# Patient Record
Sex: Female | Born: 1950 | Race: White | Hispanic: No | Marital: Married | Smoking: Former smoker
Health system: Southern US, Community
[De-identification: ages and names within clinical notes are randomized; demographics above are authoritative.]

## PROBLEM LIST (undated history)

## (undated) DIAGNOSIS — E785 Hyperlipidemia, unspecified: Secondary | ICD-10-CM

## (undated) DIAGNOSIS — I10 Essential (primary) hypertension: Secondary | ICD-10-CM

## (undated) DIAGNOSIS — F419 Anxiety disorder, unspecified: Secondary | ICD-10-CM

## (undated) DIAGNOSIS — C50511 Malignant neoplasm of lower-outer quadrant of right female breast: Principal | ICD-10-CM

## (undated) DIAGNOSIS — T7840XA Allergy, unspecified, initial encounter: Secondary | ICD-10-CM

## (undated) DIAGNOSIS — Z841 Family history of disorders of kidney and ureter: Secondary | ICD-10-CM

## (undated) DIAGNOSIS — Z803 Family history of malignant neoplasm of breast: Secondary | ICD-10-CM

## (undated) DIAGNOSIS — H269 Unspecified cataract: Secondary | ICD-10-CM

## (undated) DIAGNOSIS — Z720 Tobacco use: Secondary | ICD-10-CM

## (undated) DIAGNOSIS — M1990 Unspecified osteoarthritis, unspecified site: Secondary | ICD-10-CM

## (undated) DIAGNOSIS — Z9889 Other specified postprocedural states: Secondary | ICD-10-CM

## (undated) DIAGNOSIS — K219 Gastro-esophageal reflux disease without esophagitis: Secondary | ICD-10-CM

## (undated) DIAGNOSIS — C50919 Malignant neoplasm of unspecified site of unspecified female breast: Secondary | ICD-10-CM

## (undated) DIAGNOSIS — R112 Nausea with vomiting, unspecified: Secondary | ICD-10-CM

## (undated) DIAGNOSIS — Z8419 Family history of other disorders of kidney and ureter: Secondary | ICD-10-CM

## (undated) DIAGNOSIS — M899 Disorder of bone, unspecified: Secondary | ICD-10-CM

## (undated) DIAGNOSIS — M949 Disorder of cartilage, unspecified: Secondary | ICD-10-CM

## (undated) HISTORY — PX: DILATION AND CURETTAGE OF UTERUS: SHX78

## (undated) HISTORY — PX: ABDOMINAL HYSTERECTOMY: SHX81

## (undated) HISTORY — PX: COLONOSCOPY: SHX174

## (undated) HISTORY — DX: Tobacco use: Z72.0

## (undated) HISTORY — DX: Hyperlipidemia, unspecified: E78.5

## (undated) HISTORY — DX: Disorder of cartilage, unspecified: M94.9

## (undated) HISTORY — DX: Family history of disorders of kidney and ureter: Z84.1

## (undated) HISTORY — PX: APPENDECTOMY: SHX54

## (undated) HISTORY — DX: Family history of malignant neoplasm of breast: Z80.3

## (undated) HISTORY — DX: Malignant neoplasm of unspecified site of unspecified female breast: C50.919

## (undated) HISTORY — DX: Essential (primary) hypertension: I10

## (undated) HISTORY — DX: Disorder of bone, unspecified: M89.9

## (undated) HISTORY — DX: Gastro-esophageal reflux disease without esophagitis: K21.9

## (undated) HISTORY — DX: Anxiety disorder, unspecified: F41.9

## (undated) HISTORY — DX: Allergy, unspecified, initial encounter: T78.40XA

## (undated) HISTORY — DX: Unspecified osteoarthritis, unspecified site: M19.90

## (undated) HISTORY — DX: Family history of other disorders of kidney and ureter: Z84.19

## (undated) HISTORY — DX: Malignant neoplasm of lower-outer quadrant of right female breast: C50.511

## (undated) HISTORY — DX: Unspecified cataract: H26.9

---

## 1994-05-16 HISTORY — PX: TOTAL VAGINAL HYSTERECTOMY: SHX2548

## 1999-07-07 ENCOUNTER — Other Ambulatory Visit: Admission: RE | Admit: 1999-07-07 | Discharge: 1999-07-07 | Payer: Self-pay | Admitting: *Deleted

## 2000-08-10 ENCOUNTER — Other Ambulatory Visit: Admission: RE | Admit: 2000-08-10 | Discharge: 2000-08-10 | Payer: Self-pay | Admitting: *Deleted

## 2001-08-07 ENCOUNTER — Other Ambulatory Visit: Admission: RE | Admit: 2001-08-07 | Discharge: 2001-08-07 | Payer: Self-pay | Admitting: *Deleted

## 2002-08-26 ENCOUNTER — Other Ambulatory Visit: Admission: RE | Admit: 2002-08-26 | Discharge: 2002-08-26 | Payer: Self-pay | Admitting: *Deleted

## 2003-09-23 ENCOUNTER — Other Ambulatory Visit: Admission: RE | Admit: 2003-09-23 | Discharge: 2003-09-23 | Payer: Self-pay | Admitting: *Deleted

## 2004-03-31 ENCOUNTER — Ambulatory Visit: Payer: Self-pay | Admitting: Family Medicine

## 2004-04-21 ENCOUNTER — Ambulatory Visit: Payer: Self-pay | Admitting: Family Medicine

## 2004-05-05 ENCOUNTER — Ambulatory Visit: Payer: Self-pay | Admitting: Family Medicine

## 2004-05-26 ENCOUNTER — Ambulatory Visit: Payer: Self-pay | Admitting: Family Medicine

## 2004-06-15 ENCOUNTER — Ambulatory Visit: Payer: Self-pay | Admitting: Family Medicine

## 2004-07-07 ENCOUNTER — Ambulatory Visit: Payer: Self-pay | Admitting: Family Medicine

## 2004-08-03 ENCOUNTER — Ambulatory Visit: Payer: Self-pay | Admitting: Family Medicine

## 2004-08-17 ENCOUNTER — Ambulatory Visit: Payer: Self-pay | Admitting: Family Medicine

## 2004-09-09 ENCOUNTER — Ambulatory Visit: Payer: Self-pay | Admitting: Family Medicine

## 2004-09-23 ENCOUNTER — Other Ambulatory Visit: Admission: RE | Admit: 2004-09-23 | Discharge: 2004-09-23 | Payer: Self-pay | Admitting: *Deleted

## 2004-09-29 ENCOUNTER — Ambulatory Visit: Payer: Self-pay | Admitting: Family Medicine

## 2004-10-20 ENCOUNTER — Ambulatory Visit: Payer: Self-pay | Admitting: Family Medicine

## 2004-11-04 ENCOUNTER — Ambulatory Visit: Payer: Self-pay | Admitting: Family Medicine

## 2004-11-25 ENCOUNTER — Ambulatory Visit: Payer: Self-pay | Admitting: Family Medicine

## 2004-12-15 ENCOUNTER — Ambulatory Visit: Payer: Self-pay | Admitting: Family Medicine

## 2005-01-05 ENCOUNTER — Ambulatory Visit: Payer: Self-pay | Admitting: Family Medicine

## 2005-01-10 ENCOUNTER — Ambulatory Visit: Payer: Self-pay | Admitting: Family Medicine

## 2005-01-13 ENCOUNTER — Ambulatory Visit: Payer: Self-pay | Admitting: Family Medicine

## 2005-01-26 ENCOUNTER — Ambulatory Visit: Payer: Self-pay | Admitting: Family Medicine

## 2005-02-23 ENCOUNTER — Ambulatory Visit: Payer: Self-pay | Admitting: Family Medicine

## 2005-03-17 ENCOUNTER — Ambulatory Visit: Payer: Self-pay | Admitting: Family Medicine

## 2005-04-06 ENCOUNTER — Ambulatory Visit: Payer: Self-pay | Admitting: Family Medicine

## 2005-04-27 ENCOUNTER — Ambulatory Visit: Payer: Self-pay | Admitting: Family Medicine

## 2005-05-19 ENCOUNTER — Ambulatory Visit: Payer: Self-pay | Admitting: Family Medicine

## 2005-06-14 ENCOUNTER — Ambulatory Visit: Payer: Self-pay | Admitting: Family Medicine

## 2005-07-12 ENCOUNTER — Ambulatory Visit: Payer: Self-pay | Admitting: Family Medicine

## 2005-08-11 ENCOUNTER — Ambulatory Visit: Payer: Self-pay | Admitting: Family Medicine

## 2005-09-07 ENCOUNTER — Ambulatory Visit: Payer: Self-pay | Admitting: Family Medicine

## 2005-09-13 ENCOUNTER — Encounter: Payer: Self-pay | Admitting: Family Medicine

## 2005-09-13 LAB — CONVERTED CEMR LAB: Pap Smear: NORMAL

## 2005-09-21 ENCOUNTER — Ambulatory Visit: Payer: Self-pay | Admitting: Family Medicine

## 2005-10-04 ENCOUNTER — Ambulatory Visit: Payer: Self-pay | Admitting: Family Medicine

## 2005-10-19 ENCOUNTER — Other Ambulatory Visit: Admission: RE | Admit: 2005-10-19 | Discharge: 2005-10-19 | Payer: Self-pay | Admitting: *Deleted

## 2005-10-20 ENCOUNTER — Ambulatory Visit: Payer: Self-pay | Admitting: Family Medicine

## 2005-11-09 ENCOUNTER — Ambulatory Visit: Payer: Self-pay | Admitting: Family Medicine

## 2005-11-29 ENCOUNTER — Ambulatory Visit: Payer: Self-pay | Admitting: Family Medicine

## 2005-12-20 ENCOUNTER — Ambulatory Visit: Payer: Self-pay | Admitting: Family Medicine

## 2006-01-12 ENCOUNTER — Ambulatory Visit: Payer: Self-pay | Admitting: Family Medicine

## 2006-01-23 ENCOUNTER — Ambulatory Visit: Payer: Self-pay | Admitting: Family Medicine

## 2006-02-14 ENCOUNTER — Ambulatory Visit: Payer: Self-pay | Admitting: Internal Medicine

## 2006-03-09 ENCOUNTER — Ambulatory Visit: Payer: Self-pay | Admitting: Family Medicine

## 2006-03-16 ENCOUNTER — Ambulatory Visit: Payer: Self-pay | Admitting: Family Medicine

## 2006-03-30 ENCOUNTER — Ambulatory Visit: Payer: Self-pay | Admitting: Family Medicine

## 2006-04-20 ENCOUNTER — Ambulatory Visit: Payer: Self-pay | Admitting: Family Medicine

## 2006-05-18 ENCOUNTER — Ambulatory Visit: Payer: Self-pay | Admitting: Family Medicine

## 2006-06-08 ENCOUNTER — Ambulatory Visit: Payer: Self-pay | Admitting: Family Medicine

## 2006-06-29 ENCOUNTER — Ambulatory Visit: Payer: Self-pay | Admitting: Family Medicine

## 2006-07-20 ENCOUNTER — Ambulatory Visit: Payer: Self-pay | Admitting: Family Medicine

## 2006-08-17 ENCOUNTER — Ambulatory Visit: Payer: Self-pay | Admitting: Family Medicine

## 2006-09-07 ENCOUNTER — Ambulatory Visit: Payer: Self-pay | Admitting: Family Medicine

## 2006-09-08 ENCOUNTER — Encounter: Payer: Self-pay | Admitting: Family Medicine

## 2006-09-08 DIAGNOSIS — J309 Allergic rhinitis, unspecified: Secondary | ICD-10-CM | POA: Insufficient documentation

## 2006-09-08 DIAGNOSIS — Z87891 Personal history of nicotine dependence: Secondary | ICD-10-CM

## 2006-09-08 DIAGNOSIS — I10 Essential (primary) hypertension: Secondary | ICD-10-CM | POA: Insufficient documentation

## 2006-09-28 ENCOUNTER — Ambulatory Visit: Payer: Self-pay | Admitting: Family Medicine

## 2006-10-24 ENCOUNTER — Ambulatory Visit: Payer: Self-pay | Admitting: Family Medicine

## 2006-11-14 ENCOUNTER — Ambulatory Visit: Payer: Self-pay | Admitting: Family Medicine

## 2006-11-30 ENCOUNTER — Ambulatory Visit: Payer: Self-pay | Admitting: Family Medicine

## 2006-12-12 ENCOUNTER — Other Ambulatory Visit: Admission: RE | Admit: 2006-12-12 | Discharge: 2006-12-12 | Payer: Self-pay | Admitting: *Deleted

## 2006-12-21 ENCOUNTER — Ambulatory Visit: Payer: Self-pay | Admitting: Family Medicine

## 2007-01-17 ENCOUNTER — Ambulatory Visit: Payer: Self-pay | Admitting: Family Medicine

## 2007-01-18 ENCOUNTER — Ambulatory Visit: Payer: Self-pay | Admitting: Internal Medicine

## 2007-02-01 ENCOUNTER — Ambulatory Visit: Payer: Self-pay | Admitting: Family Medicine

## 2007-02-01 DIAGNOSIS — E7800 Pure hypercholesterolemia, unspecified: Secondary | ICD-10-CM

## 2007-02-05 LAB — CONVERTED CEMR LAB
Basophils Relative: 0.5 % (ref 0.0–1.0)
CO2: 28 meq/L (ref 19–32)
Cholesterol: 170 mg/dL (ref 0–200)
Creatinine, Ser: 0.8 mg/dL (ref 0.4–1.2)
Glucose, Bld: 80 mg/dL (ref 70–99)
HCT: 37.5 % (ref 36.0–46.0)
Hemoglobin: 13.2 g/dL (ref 12.0–15.0)
LDL Cholesterol: 93 mg/dL (ref 0–99)
Monocytes Absolute: 0.5 10*3/uL (ref 0.2–0.7)
Neutrophils Relative %: 60.6 % (ref 43.0–77.0)
Potassium: 4.1 meq/L (ref 3.5–5.1)
RDW: 11.7 % (ref 11.5–14.6)
Sodium: 140 meq/L (ref 135–145)
TSH: 1.17 microintl units/mL (ref 0.35–5.50)
Total Bilirubin: 0.7 mg/dL (ref 0.3–1.2)
Total Protein: 7 g/dL (ref 6.0–8.3)
VLDL: 34 mg/dL (ref 0–40)

## 2007-02-21 ENCOUNTER — Ambulatory Visit: Payer: Self-pay | Admitting: Family Medicine

## 2007-03-14 ENCOUNTER — Ambulatory Visit: Payer: Self-pay | Admitting: Family Medicine

## 2007-03-15 ENCOUNTER — Ambulatory Visit: Payer: Self-pay | Admitting: Family Medicine

## 2007-04-04 ENCOUNTER — Ambulatory Visit: Payer: Self-pay | Admitting: Family Medicine

## 2007-04-25 ENCOUNTER — Ambulatory Visit: Payer: Self-pay | Admitting: Family Medicine

## 2007-04-27 ENCOUNTER — Ambulatory Visit: Payer: Self-pay | Admitting: Family Medicine

## 2007-05-22 ENCOUNTER — Ambulatory Visit: Payer: Self-pay | Admitting: Family Medicine

## 2007-06-13 ENCOUNTER — Ambulatory Visit: Payer: Self-pay | Admitting: Family Medicine

## 2007-07-05 ENCOUNTER — Ambulatory Visit: Payer: Self-pay | Admitting: Family Medicine

## 2007-07-24 ENCOUNTER — Ambulatory Visit: Payer: Self-pay | Admitting: Family Medicine

## 2007-08-09 ENCOUNTER — Ambulatory Visit: Payer: Self-pay | Admitting: Family Medicine

## 2007-09-05 ENCOUNTER — Ambulatory Visit: Payer: Self-pay | Admitting: Family Medicine

## 2007-09-27 ENCOUNTER — Ambulatory Visit: Payer: Self-pay | Admitting: Family Medicine

## 2007-10-18 ENCOUNTER — Ambulatory Visit: Payer: Self-pay | Admitting: Family Medicine

## 2007-11-01 ENCOUNTER — Ambulatory Visit: Payer: Self-pay | Admitting: Family Medicine

## 2007-11-21 ENCOUNTER — Ambulatory Visit: Payer: Self-pay | Admitting: Family Medicine

## 2007-12-06 ENCOUNTER — Ambulatory Visit: Payer: Self-pay | Admitting: Family Medicine

## 2007-12-12 ENCOUNTER — Other Ambulatory Visit: Admission: RE | Admit: 2007-12-12 | Discharge: 2007-12-12 | Payer: Self-pay | Admitting: Gynecology

## 2007-12-17 ENCOUNTER — Telehealth (INDEPENDENT_AMBULATORY_CARE_PROVIDER_SITE_OTHER): Payer: Self-pay | Admitting: *Deleted

## 2008-01-03 ENCOUNTER — Ambulatory Visit: Payer: Self-pay | Admitting: Family Medicine

## 2008-01-24 ENCOUNTER — Ambulatory Visit: Payer: Self-pay | Admitting: Family Medicine

## 2008-02-13 ENCOUNTER — Ambulatory Visit: Payer: Self-pay | Admitting: Family Medicine

## 2008-02-13 DIAGNOSIS — M899 Disorder of bone, unspecified: Secondary | ICD-10-CM | POA: Insufficient documentation

## 2008-02-13 DIAGNOSIS — M949 Disorder of cartilage, unspecified: Secondary | ICD-10-CM

## 2008-02-13 LAB — CONVERTED CEMR LAB
Glucose, Urine, Semiquant: NEGATIVE
Protein, U semiquant: NEGATIVE
Specific Gravity, Urine: <1.005
WBC Urine, dipstick: NEGATIVE
pH: 7.5

## 2008-02-20 LAB — CONVERTED CEMR LAB
BUN: 20 mg/dL (ref 6–23)
Bilirubin, Direct: 0.1 mg/dL (ref 0.0–0.3)
CO2: 28 meq/L (ref 19–32)
Chloride: 99 meq/L (ref 96–112)
Eosinophils Relative: 2.7 % (ref 0.0–5.0)
GFR calc non Af Amer: 68 mL/min
Glucose, Bld: 94 mg/dL (ref 70–99)
HCT: 40.1 % (ref 36.0–46.0)
HDL: 45 mg/dL (ref >39.0)
Lymphocytes Relative: 29.7 % (ref 12.0–46.0)
Monocytes Absolute: 0.6 10*3/uL (ref 0.1–1.0)
Monocytes Relative: 7.6 % (ref 3.0–12.0)
Neutrophils Relative %: 59.3 % (ref 43.0–77.0)
Platelets: 258 10*3/uL (ref 150–400)
Potassium: 4.1 meq/L (ref 3.5–5.1)
TSH: 1.34 microintl units/mL (ref 0.35–5.50)
Total Bilirubin: 0.7 mg/dL (ref 0.3–1.2)
Total CHOL/HDL Ratio: 4.2
Vit D, 1,25-Dihydroxy: 36 (ref 30–89)
WBC: 8.2 10*3/uL (ref 4.5–10.5)

## 2008-02-26 ENCOUNTER — Ambulatory Visit: Payer: Self-pay | Admitting: Family Medicine

## 2008-03-12 ENCOUNTER — Ambulatory Visit: Payer: Self-pay | Admitting: Family Medicine

## 2008-03-12 LAB — CONVERTED CEMR LAB
OCCULT 1: NEGATIVE
OCCULT 2: NEGATIVE
OCCULT 3: NEGATIVE

## 2008-03-19 ENCOUNTER — Ambulatory Visit: Payer: Self-pay | Admitting: Family Medicine

## 2008-04-01 ENCOUNTER — Ambulatory Visit: Payer: Self-pay | Admitting: Family Medicine

## 2008-04-23 ENCOUNTER — Ambulatory Visit: Payer: Self-pay | Admitting: Family Medicine

## 2008-05-13 ENCOUNTER — Ambulatory Visit: Payer: Self-pay | Admitting: Family Medicine

## 2008-05-14 LAB — CONVERTED CEMR LAB
ALT: 29 units/L (ref 0–35)
AST: 27 units/L (ref 0–37)
VLDL: 25 mg/dL (ref 0–40)

## 2008-05-29 ENCOUNTER — Ambulatory Visit: Payer: Self-pay | Admitting: Family Medicine

## 2008-06-25 ENCOUNTER — Ambulatory Visit: Payer: Self-pay | Admitting: Family Medicine

## 2008-07-24 ENCOUNTER — Ambulatory Visit: Payer: Self-pay | Admitting: Family Medicine

## 2008-08-19 ENCOUNTER — Ambulatory Visit: Payer: Self-pay | Admitting: Family Medicine

## 2008-09-10 ENCOUNTER — Ambulatory Visit: Payer: Self-pay | Admitting: Family Medicine

## 2008-10-01 ENCOUNTER — Ambulatory Visit: Payer: Self-pay | Admitting: Family Medicine

## 2008-10-22 ENCOUNTER — Ambulatory Visit: Payer: Self-pay | Admitting: Family Medicine

## 2008-11-05 ENCOUNTER — Ambulatory Visit: Payer: Self-pay | Admitting: Family Medicine

## 2008-11-19 ENCOUNTER — Ambulatory Visit: Payer: Self-pay | Admitting: Family Medicine

## 2008-12-03 ENCOUNTER — Ambulatory Visit: Payer: Self-pay | Admitting: Family Medicine

## 2008-12-24 ENCOUNTER — Ambulatory Visit: Payer: Self-pay | Admitting: Family Medicine

## 2009-01-07 ENCOUNTER — Ambulatory Visit: Payer: Self-pay | Admitting: Family Medicine

## 2009-01-21 ENCOUNTER — Ambulatory Visit: Payer: Self-pay | Admitting: Family Medicine

## 2009-02-11 ENCOUNTER — Ambulatory Visit: Payer: Self-pay | Admitting: Family Medicine

## 2009-02-16 ENCOUNTER — Ambulatory Visit: Payer: Self-pay | Admitting: Family Medicine

## 2009-02-19 ENCOUNTER — Ambulatory Visit: Payer: Self-pay | Admitting: Internal Medicine

## 2009-02-19 ENCOUNTER — Encounter: Payer: Self-pay | Admitting: Family Medicine

## 2009-02-19 LAB — CONVERTED CEMR LAB
Albumin: 4.3 g/dL (ref 3.5–5.2)
Alkaline Phosphatase: 86 units/L (ref 39–117)
BUN: 13 mg/dL (ref 6–23)
Basophils Absolute: 0.1 10*3/uL (ref 0.0–0.1)
CO2: 29 meq/L (ref 19–32)
Calcium: 9.8 mg/dL (ref 8.4–10.5)
Cholesterol: 194 mg/dL (ref 0–200)
Eosinophils Absolute: 0.1 10*3/uL (ref 0.0–0.7)
Glucose, Bld: 92 mg/dL (ref 70–99)
HDL: 43.6 mg/dL (ref >39.00)
Hemoglobin: 14 g/dL (ref 12.0–15.0)
Lymphocytes Relative: 29 % (ref 12.0–46.0)
Lymphs Abs: 1.7 10*3/uL (ref 0.7–4.0)
MCHC: 34 g/dL (ref 30.0–36.0)
Neutro Abs: 3.8 10*3/uL (ref 1.4–7.7)
RDW: 12.3 % (ref 11.5–14.6)
Sodium: 143 meq/L (ref 135–145)
TSH: 1.35 microintl units/mL (ref 0.35–5.50)
Triglycerides: 160 mg/dL — ABNORMAL HIGH (ref 0.0–149.0)

## 2009-02-26 ENCOUNTER — Ambulatory Visit: Payer: Self-pay | Admitting: Family Medicine

## 2009-03-16 ENCOUNTER — Ambulatory Visit: Payer: Self-pay | Admitting: Gastroenterology

## 2009-03-19 ENCOUNTER — Ambulatory Visit: Payer: Self-pay | Admitting: Family Medicine

## 2009-03-30 ENCOUNTER — Ambulatory Visit: Payer: Self-pay | Admitting: Gastroenterology

## 2009-04-07 ENCOUNTER — Ambulatory Visit: Payer: Self-pay | Admitting: Family Medicine

## 2009-04-16 ENCOUNTER — Encounter: Payer: Self-pay | Admitting: Family Medicine

## 2009-04-22 ENCOUNTER — Encounter (INDEPENDENT_AMBULATORY_CARE_PROVIDER_SITE_OTHER): Payer: Self-pay | Admitting: *Deleted

## 2009-04-28 ENCOUNTER — Encounter: Payer: Self-pay | Admitting: Family Medicine

## 2009-05-13 ENCOUNTER — Ambulatory Visit: Payer: Self-pay | Admitting: Family Medicine

## 2009-05-19 ENCOUNTER — Ambulatory Visit: Payer: Self-pay | Admitting: Family Medicine

## 2009-05-20 LAB — CONVERTED CEMR LAB
ALT: 45 units/L — ABNORMAL HIGH (ref 0–35)
AST: 34 units/L (ref 0–37)
Albumin: 3.9 g/dL (ref 3.5–5.2)
Alkaline Phosphatase: 72 units/L (ref 39–117)
Cholesterol: 188 mg/dL (ref 0–200)
Total Protein: 7.7 g/dL (ref 6.0–8.3)
Triglycerides: 181 mg/dL — ABNORMAL HIGH (ref 0.0–149.0)

## 2009-06-04 ENCOUNTER — Ambulatory Visit: Payer: Self-pay | Admitting: Family Medicine

## 2009-06-23 ENCOUNTER — Ambulatory Visit: Payer: Self-pay | Admitting: Family Medicine

## 2009-07-09 ENCOUNTER — Ambulatory Visit: Payer: Self-pay | Admitting: Family Medicine

## 2009-07-29 ENCOUNTER — Ambulatory Visit: Payer: Self-pay | Admitting: Family Medicine

## 2009-08-13 ENCOUNTER — Ambulatory Visit: Payer: Self-pay | Admitting: Family Medicine

## 2009-09-08 ENCOUNTER — Ambulatory Visit: Payer: Self-pay | Admitting: Family Medicine

## 2009-09-24 ENCOUNTER — Ambulatory Visit: Payer: Self-pay | Admitting: Family Medicine

## 2009-10-14 ENCOUNTER — Ambulatory Visit: Payer: Self-pay | Admitting: Family Medicine

## 2009-11-03 ENCOUNTER — Ambulatory Visit: Payer: Self-pay | Admitting: Family Medicine

## 2009-12-08 ENCOUNTER — Ambulatory Visit: Payer: Self-pay | Admitting: Family Medicine

## 2009-12-31 ENCOUNTER — Ambulatory Visit: Payer: Self-pay | Admitting: Family Medicine

## 2010-01-28 ENCOUNTER — Ambulatory Visit: Payer: Self-pay | Admitting: Family Medicine

## 2010-02-16 ENCOUNTER — Ambulatory Visit: Payer: Self-pay | Admitting: Family Medicine

## 2010-02-22 ENCOUNTER — Telehealth (INDEPENDENT_AMBULATORY_CARE_PROVIDER_SITE_OTHER): Payer: Self-pay | Admitting: *Deleted

## 2010-02-23 ENCOUNTER — Ambulatory Visit: Payer: Self-pay | Admitting: Family Medicine

## 2010-02-24 LAB — CONVERTED CEMR LAB
AST: 22 units/L (ref 0–37)
Albumin: 4.1 g/dL (ref 3.5–5.2)
Basophils Absolute: 0 10*3/uL (ref 0.0–0.1)
CO2: 28 meq/L (ref 19–32)
GFR calc non Af Amer: 76.42 mL/min (ref >60)
Glucose, Bld: 94 mg/dL (ref 70–99)
HCT: 39.5 % (ref 36.0–46.0)
Hemoglobin: 13.6 g/dL (ref 12.0–15.0)
Lymphs Abs: 1.9 10*3/uL (ref 0.7–4.0)
MCHC: 34.4 g/dL (ref 30.0–36.0)
Monocytes Relative: 7.5 % (ref 3.0–12.0)
Neutro Abs: 4.6 10*3/uL (ref 1.4–7.7)
Potassium: 4.2 meq/L (ref 3.5–5.1)
RDW: 13.2 % (ref 11.5–14.6)
Sodium: 140 meq/L (ref 135–145)
TSH: 1.31 microintl units/mL (ref 0.35–5.50)
Total Protein: 7.2 g/dL (ref 6.0–8.3)

## 2010-03-01 ENCOUNTER — Ambulatory Visit: Payer: Self-pay | Admitting: Family Medicine

## 2010-04-01 ENCOUNTER — Ambulatory Visit: Payer: Self-pay | Admitting: Family Medicine

## 2010-04-19 ENCOUNTER — Encounter: Payer: Self-pay | Admitting: Family Medicine

## 2010-04-20 ENCOUNTER — Encounter: Payer: Self-pay | Admitting: Family Medicine

## 2010-04-27 ENCOUNTER — Ambulatory Visit: Payer: Self-pay | Admitting: Family Medicine

## 2010-05-25 ENCOUNTER — Ambulatory Visit
Admission: RE | Admit: 2010-05-25 | Discharge: 2010-05-25 | Payer: Self-pay | Source: Home / Self Care | Attending: Family Medicine | Admitting: Family Medicine

## 2010-06-15 ENCOUNTER — Ambulatory Visit
Admission: RE | Admit: 2010-06-15 | Discharge: 2010-06-15 | Payer: Self-pay | Source: Home / Self Care | Attending: Family Medicine | Admitting: Family Medicine

## 2010-06-15 NOTE — Assessment & Plan Note (Signed)
Summary: ALLERGY INJECTION CYD  Nurse Visit   Allergies: No Known Drug Allergies  Orders Added: 1)  Admin of Therapeutic Inj  intramuscular or subcutaneous [96372]   Orders Added: 1)  Admin of Therapeutic Inj  intramuscular or subcutaneous [09811]

## 2010-06-15 NOTE — Miscellaneous (Signed)
Summary: Controlled Substances Contract  Controlled Substances Contract   Imported By: Maryln Gottron 03/05/2010 13:57:54  _____________________________________________________________________  External Attachment:    Type:   Image     Comment:   External Document

## 2010-06-15 NOTE — Assessment & Plan Note (Signed)
Summary: ALLERGY SHOT/Mercede Rollo/CLE  Nurse Visit   Allergies: No Known Drug Allergies  Medication Administration  Injection # 1:    Medication: Allergy Injection (1)    Diagnosis: ALLERGIC RHINITIS (ICD-477.9)    Route: SQ    Site: R deltoid    Exp Date: 05/01/2010    Lot #: TGDDM    Mfr: [place]    Comments: 0.5ML given    Patient tolerated injection without complications    Given by: Linde Gillis CMA Duncan Dull) (Sep 24, 2009 9:17 AM)  Orders Added: 1)  Allergy Injection (1) [95115] 2)  Admin of patients own med IM/SQ [16010X]

## 2010-06-15 NOTE — Assessment & Plan Note (Signed)
Summary: ALLERGY SHOT/DLO  Nurse Visit   Allergies: No Known Drug Allergies  Medication Administration  Injection # 1:    Medication: Allergy Injection (1)    Diagnosis: ALLERGIC RHINITIS (ICD-477.9)    Route: SQ    Site: L deltoid    Exp Date: 05/01/2010    Lot #: TGDDM    Mfr: [place] ALLERGY    Patient tolerated injection without complications    Given by: Lewanda Rife LPN (June 23, 2009 10:39 AM)  Orders Added: 1)  Allergy Injection (1) [91478] 2)  Admin of patients own med IM/SQ [[id number]]

## 2010-06-15 NOTE — Assessment & Plan Note (Signed)
Summary: ALLERGY SHOT/Adonia Porada/CLE  Nurse Visit   Allergies: No Known Drug Allergies  Medication Administration  Injection # 1:    Medication: Allergy Injection (1)    Diagnosis: ALLERGIC RHINITIS (ICD-477.9)    Route: SQ    Site: L deltoid    Exp Date: 05/01/2010    Lot #: TGDDM    Mfr: [place] allergy    Patient tolerated injection without complications    Given by: Lewanda Rife LPN (December 08, 2009 9:30 AM)  Orders Added: 1)  Allergy Injection (1) [95115] 2)  Admin of Therapeutic Inj  intramuscular or subcutaneous [04540]

## 2010-06-15 NOTE — Assessment & Plan Note (Signed)
Summary: ALLERGY SHOT/TOWER/CLE  Nurse Visit   Allergies: No Known Drug Allergies  Medication Administration  Injection # 1:    Medication: Allergy Injection (1)    Diagnosis: ALLERGIC RHINITIS (ICD-477.9)    Route: SQ    Site: Left arm    Patient tolerated injection without complications    Given by: Delilah Shan CMA Duncan Dull) (October 15, 2009 4:35 PM)  Orders Added: 1)  Admin of Therapeutic Inj  intramuscular or subcutaneous [96372]   Medication Administration  Injection # 1:    Medication: Allergy Injection (1)    Diagnosis: ALLERGIC RHINITIS (ICD-477.9)    Route: SQ    Site: Left arm    Patient tolerated injection without complications    Given by: Delilah Shan CMA Duncan Dull) (October 15, 2009 4:35 PM)  Orders Added: 1)  Admin of Therapeutic Inj  intramuscular or subcutaneous [[id number]]

## 2010-06-15 NOTE — Assessment & Plan Note (Signed)
Summary: ALLERGY SHOT/Nizar Cutler/CLE  Nurse Visit   Allergies: No Known Drug Allergies  Medication Administration  Injection # 1:    Medication: Allergy Injection (1)    Diagnosis: ALLERGIC RHINITIS (ICD-477.9)    Route: SQ    Site: R deltoid    Exp Date: 05/01/2010    Lot #: TGDDM    Mfr: [place] allergy    Patient tolerated injection without complications    Given by: Lewanda Rife LPN (December 31, 2009 9:35 AM)  Orders Added: 1)  Allergy Injection (1) [95115] 2)  Admin of patients own med IM/SQ [[id number]]

## 2010-06-15 NOTE — Assessment & Plan Note (Signed)
Summary: allergy shot/alc  Nurse Visit   Allergies: No Known Drug Allergies  Medication Administration  Injection # 1:    Medication: Allergy Injection (1)    Diagnosis: ALLERGIC RHINITIS (ICD-477.9)    Route: SQ    Site: L deltoid    Exp Date: 05/01/2010    Lot #: TGDDM    Mfr: [place]    Comments: 0.5ML GIVEN    Patient tolerated injection without complications    Given by: Linde Gillis CMA Duncan Dull) (September 08, 2009 9:24 AM)  Orders Added: 1)  Allergy Injection (1) [95115] 2)  Admin of patients own med IM/SQ [25956L]

## 2010-06-15 NOTE — Assessment & Plan Note (Signed)
Summary: ALLERGY SHOT/Jamie Dillon/CLE  Nurse Visit   Allergies: No Known Drug Allergies  Medication Administration  Injection # 1:    Medication: Allergy Injection (1)    Diagnosis: ALLERGIC RHINITIS (ICD-477.9)    Route: SQ    Site: R deltoid    Patient tolerated injection without complications    Given by: Delilah Shan CMA Duncan Dull) (November 03, 2009 9:24 AM)  Orders Added: 1)  Admin of Therapeutic Inj  intramuscular or subcutaneous [96372]   Medication Administration  Injection # 1:    Medication: Allergy Injection (1)    Diagnosis: ALLERGIC RHINITIS (ICD-477.9)    Route: SQ    Site: R deltoid    Patient tolerated injection without complications    Given by: Delilah Shan CMA Duncan Dull) (November 03, 2009 9:24 AM)  Orders Added: 1)  Admin of Therapeutic Inj  intramuscular or subcutaneous [04540]

## 2010-06-15 NOTE — Assessment & Plan Note (Signed)
Summary: ALLERGY SHOT/Karris Deangelo/CLE  Nurse Visit   Allergies: No Known Drug Allergies  Medication Administration  Injection # 1:    Medication: Allergy Injection (1)    Diagnosis: ALLERGIC RHINITIS (ICD-477.9)    Route: SQ    Site: R deltoid    Exp Date: 05/01/2010    Lot #: TGDDM    Mfr: Corinda [place] Allergy    Patient tolerated injection without complications    Given by: Lewanda Rife LPN (August 13, 2009 9:32 AM)  Orders Added: 1)  Allergy Injection (1) [95115] 2)  Admin of Therapeutic Inj  intramuscular or subcutaneous [16109]

## 2010-06-15 NOTE — Assessment & Plan Note (Signed)
Summary: ALLERGY SHOT/ [facility]  Nurse Visit   Allergies: No Known Drug Allergies  Medication Administration  Injection # 1:    Medication: Allergy Injection (1)    Diagnosis: ALLERGIC RHINITIS (ICD-477.9)    Route: SQ    Site: R deltoid    Exp Date: 05/01/2010    Lot #: TGDDM    Mfr: [place] ALLERGY    Comments: 0.4 DOSE GIVEN TODAY     Patient tolerated injection without complications    Given by: Lewanda Rife LPN (July 09, 2009 9:37 AM)  Orders Added: 1)  Allergy Injection (1) [95115] 2)  Admin of patients own med IM/SQ [56213Y]

## 2010-06-15 NOTE — Assessment & Plan Note (Signed)
Summary: cpx/dlo   Vital Signs:  Patient profile:   60 year old female Height:      60.5 inches Weight:      131.25 pounds BMI:     25.30 Temp:     98.2 degrees F oral Pulse rate:   68 / minute Pulse rhythm:   regular BP sitting:   132 / 84  (left arm) Cuff size:   regular  Vitals Entered By: Lewanda Rife LPN (March 01, 2010 2:25 PM) CC: CPX LMP complete hyst 1995   History of Present Illness: here for health mt exam and to rev chronic med problems  has been feeling good - nothing new   wt is up 1 lb with good bmi of 25  HTN is well controlled 132/84 today   lipids well controlled with lipitor and diet with trig 164 and HDL 47 and LDL 110 has been eating very well   hyst total in past  pap nl 09 no symptoms or new sexual partners   mam 12/10 self exam - no lumps or changes   hx of osteopenia but last dexa 10/10 was normal  ca and D D level is 45-- thinks she takes 1000 international units per day with calcium   colonosc 11/10 - good -- 10 y f/u  Td 5/03  has cut way back on smoking -- some days no cig at all  official quit date is the first of the year   flu shot today     Allergies (verified): No Known Drug Allergies  Past History:  Past Medical History: Last updated: 02/13/2008 Allergic rhinitis Hypertension hyperlipidemia tabacco abuse   Past Surgical History: Last updated: 09/08/2006 Hysterectomy/ BSO- endometriosis Dexa- osteopenia (09/2001), Dexa- stable (06/2003)  Family History: Last updated: 02/01/2007 sister with breast ca mother with HTN father died of kidney dz P aunt DM  Social History: Last updated: 02/13/2008 Current Smoker gym and walking for exercise  cares for elderly mother and sister  is married rare alcohol   Risk Factors: Smoking Status: current (09/08/2006)  Review of Systems General:  Denies fatigue, loss of appetite, and malaise. Eyes:  Denies blurring and eye irritation. CV:  Denies chest pain or  discomfort and lightheadness. Resp:  Denies cough, shortness of breath, and wheezing. GI:  Denies abdominal pain, change in bowel habits, and indigestion. GU:  Denies abnormal vaginal bleeding, discharge, dysuria, and urinary frequency. MS:  Denies joint pain, joint redness, and joint swelling. Derm:  Denies itching, lesion(s), poor wound healing, and rash. Neuro:  Denies numbness and tingling. Psych:  Denies anxiety and depression. Endo:  Denies excessive thirst and excessive urination. Heme:  Denies abnormal bruising and bleeding.  Physical Exam  General:  Well-developed,well-nourished,in no acute distress; alert,appropriate and cooperative throughout examination Head:  normocephalic, atraumatic, and no abnormalities observed.   Eyes:  vision grossly intact, pupils equal, pupils round, and pupils reactive to light.   Ears:  R ear normal and L ear normal.   Nose:  no nasal discharge.   Mouth:  pharynx pink and moist.   Neck:  supple with full rom and no masses or thyromegally, no JVD or carotid bruit  Chest Wall:  No deformities, masses, or tenderness noted. Breasts:  No mass, nodules, thickening, tenderness, bulging, retraction, inflamation, nipple discharge or skin changes noted.   Lungs:  slightly distant bs without rales or rhonchi or wheeze Heart:  Normal rate and regular rhythm. S1 and S2 normal without gallop, murmur, click, rub or  other extra sounds. Abdomen:  Bowel sounds positive,abdomen soft and non-tender without masses, organomegaly or hernias noted. no renal bruits  Msk:  No deformity or scoliosis noted of thoracic or lumbar spine.  no acute joint changes  Pulses:  R and L carotid,radial,femoral,dorsalis pedis and posterior tibial pulses are full and equal bilaterally Extremities:  No clubbing, cyanosis, edema, or deformity noted with normal full range of motion of all joints.   Neurologic:  sensation intact to light touch, gait normal, and DTRs symmetrical and normal.     Skin:  Intact without suspicious lesions or rashes lentigos diffusely  Cervical Nodes:  No lymphadenopathy noted Axillary Nodes:  No palpable lymphadenopathy Inguinal Nodes:  No significant adenopathy Psych:  normal affect, talkative and pleasant    Impression & Recommendations:  Problem # 1:  HEALTH MAINTENANCE EXAM (ICD-V70.0) Assessment Comment Only  reviewed health habits including diet, exercise and skin cancer prevention reviewed health maintenance list and family history  rev labs in detail disc imp of smoking cessation flu shot today  Orders: Prescription Created Electronically [phone])  Problem # 2:  HYPERCHOLESTEROLEMIA, PURE (ICD-272.0) Assessment: Unchanged  good control with lipitor and diet  continue these without change disc exercise plan Her updated medication list for this problem includes:    Lipitor 10 Mg Tabs (Atorvastatin calcium) .Marland Kitchen... 1 by mouth once daily  Labs Reviewed: SGOT: 22 (02/23/2010)   SGPT: 22 (02/23/2010)   HDL:47.70 (02/23/2010), 44.90 (05/19/2009)  LDL:110 (02/23/2010), 107 (05/19/2009)  Chol:190 (02/23/2010), 188 (05/19/2009)  Trig:164.0 (02/23/2010), 181.0 (05/19/2009)  Orders: Prescription Created Electronically [phone])  Problem # 3:  TOBACCO ABUSE, HX OF (ICD-V15.82) Assessment: Comment Only  discussed in detail risks of smoking, and possible outcomes including COPD, vascular dz, cancer and also respiratory infections/sinus problems  pt making progress and almost quit on nicotine gum  quit date jan 1  Orders: Prescription Created Electronically [phone])  Problem # 4:  ALLERGIC RHINITIS (ICD-477.9) Assessment: Comment Only  allergy injection today Her updated medication list for this problem includes:    Allegra 60 Mg Tabs (Fexofenadine hcl) .Marland Kitchen..Marland Kitchen Two times a day as needed    Rhinocort Aqua 32 Mcg/act Susp (Budesonide (nasal)) ..... One spray in each nostril once daily  Orders: Allergy Injection (1) (18841) Admin of  patients own med IM/SQ [phone]) Prescription Created Electronically [phone])  Complete Medication List: 1)  Bisoprolol-hydrochlorothiazide 10-6.25 Mg Tabs (Bisoprolol-hydrochlorothiazide) .... One by mouth daily 2)  Lipitor 10 Mg Tabs (Atorvastatin calcium) .Marland Kitchen.. 1 by mouth once daily 3)  Allegra 60 Mg Tabs (Fexofenadine hcl) .... Two times a day as needed 4)  Rhinocort Aqua 32 Mcg/act Susp (Budesonide (nasal)) .... One spray in each nostril once daily 5)  Calcium  .... Daily 6)  Multivitamins Tabs (Multiple vitamin) .... Daily 7)  Alprazolam 0.5 Mg Tabs (Alprazolam) .Marland Kitchen.. 1 by mouth once daily as needed severe anxiety 8)  Aspirin 81 Mg Tabs (Aspirin) .... Take 1 tablet by mouth once a day  Other Orders: Admin 1st Vaccine (93235) Flu Vaccine 42yrs + (57322)  Patient Instructions: 1)  don't forget to set up your mammogram for december  2)  good job with cutting down smoking - make jan 1 your quit date-- you are almost there 3)  labs look good- keep up healthy diet  4)  flu shot today  Prescriptions: RHINOCORT AQUA 32 MCG/ACT  SUSP (BUDESONIDE (NASAL)) one spray in each nostril once daily  #1 mdi x 11   Entered and Authorized by:   [employer]  Rose Fillers MD   Signed by:   Judith Part MD on 03/01/2010   Method used:   Electronically to        [id number]       [address]       [place], [place]  [address]       Ph: [phone]       Fax: [phone]   RxID:   [id number] LIPITOR 10 MG TABS (ATORVASTATIN CALCIUM) 1 by mouth once daily  #30 x 11   Entered and Authorized by:   Judith Part MD   Signed by:   Judith Part MD on 03/01/2010   Method used:   [facility] to        [facility] [address] [id number]       [address]       [place], [place]  [address]       Ph: [phone]       Fax: [phone]   RxID:   [id number] BISOPROLOL-HYDROCHLOROTHIAZIDE 10-6.25 MG TABS  (BISOPROLOL-HYDROCHLOROTHIAZIDE) one by mouth daily  #30 x 11   Entered and Authorized by:   Judith Part MD   Signed by:   Judith Part MD on 03/01/2010   Method used:   [facility]        [facility] [address] [id number]       [address]       [place], [place]  [address]       Ph: [phone]       Fax: [phone]   RxID:   [id number]    Medication Administration  Injection # 1:    Medication: Allergy Injection (1)    Diagnosis: ALLERGIC RHINITIS (ICD-477.9)    Route: SQ    Site: L deltoid    Exp Date: 05/01/2010    Lot #: TGDDM    Mfr: [place] allergy    Patient tolerated injection without complications    Given by: Lewanda Rife LPN (March 01, 2010 3:15 PM)  Orders Added: 1)  Allergy Injection (1) [95115] 2)  Admin of patients own med IM/SQ [96372M] 3)  Admin 1st Vaccine [90471] 4)  Flu Vaccine 57yrs + [62694] 5)  Prescription Created Electronically [[id number]] 6)  Est. Patient 40-64 years [phone]    Current Allergies (reviewed today): No known allergies  Flu Vaccine Consent Questions     Do you have a history of severe allergic reactions to this vaccine? no    Any prior history of allergic reactions to egg and/or gelatin? no    Do you have a sensitivity to the preservative Thimersol? no    Do you have a past history of Guillan-Barre Syndrome? no    Do you currently have an acute febrile illness? no    Have you ever had a severe reaction to latex? no    Vaccine information given and explained to patient? yes    Are you currently pregnant? no    Lot Number:[id number]   Exp Date:11/13/2010   Site Given  Right Deltoid IMScreening-CCC] Lewanda Rife LPN  March 01, 2010 3:16 PM            .[email or web address]

## 2010-06-15 NOTE — Assessment & Plan Note (Signed)
Summary: ALLERGY SHOT/DLO  Nurse Visit   Allergies: No Known Drug Allergies  Medication Administration  Injection # 1:    Medication: Allergy Injection (1)    Diagnosis: ALLERGIC RHINITIS (ICD-477.9)    Route: SQ    Site: R deltoid    Exp Date: 05/01/2010    Lot #: TGDDM    Mfr:  Allergy    Comments: Patient provided medication.    Patient tolerated injection without complications    Given by: Lewanda Rife LPN (April 01, 2010 11:58 AM)  Orders Added: 1)  Admin of patients own med IM/SQ [phone]

## 2010-06-15 NOTE — Assessment & Plan Note (Signed)
Summary: allergy shot  Nurse Visit   Allergies: No Known Drug Allergies  Medication Administration  Injection # 1:    Medication: Allergy Injection (1)    Diagnosis: ALLERGIC RHINITIS (ICD-477.9)    Route: SQ    Site: L deltoid    Exp Date: 05/01/2010    Lot #: TGDDM    Mfr: [place] Allergy    Comments: 0.5 dose given today    Patient tolerated injection without complications    Given by: Linde Gillis CMA Duncan Dull) (July 29, 2009 9:18 AM)  Orders Added: 1)  Allergy Injection (1) [95115] 2)  Admin of patients own med IM/SQ [phone]

## 2010-06-15 NOTE — Assessment & Plan Note (Signed)
Summary: ALLERGY SHOT / [facility]  Nurse Visit   Allergies: No Known Drug Allergies  Medication Administration  Injection # 1:    Medication: Allergy Injection (1)    Diagnosis: ALLERGIC RHINITIS (ICD-477.9)    Route: SQ    Site: R deltoid    Exp Date: 05/01/2010    Lot #: TGDDM    Mfr: [place] Allergy    Comments: Patient provided medication.    Patient tolerated injection without complications    Given by: Sydell Axon LPN (February 16, 2010 10:07 AM)  Orders Added: 1)  Admin of Therapeutic Inj  intramuscular or subcutaneous [96372]       Medication Administration  Injection # 1:    Medication: Allergy Injection (1)    Diagnosis: ALLERGIC RHINITIS (ICD-477.9)    Route: SQ    Site: R deltoid    Exp Date: 05/01/2010    Lot #: TGDDM    Mfr: [place] Allergy    Comments: Patient provided medication.    Patient tolerated injection without complications    Given by: Sydell Axon LPN (February 16, 2010 10:07 AM)  Orders Added: 1)  Admin of Therapeutic Inj  intramuscular or subcutaneous [08657]

## 2010-06-15 NOTE — Letter (Signed)
Summary: Results Follow up Letter  [place] [facility]  [address] [place], [place] [address]   Phone: [phone]  Fax: [phone]    04/20/2010 MRN: [id number]    [facility] [address] [place], [place]  [address]    Dear Ms. Kretz,  The following are the results of your recent test(s):  Test         Result    Pap Smear:        Normal _____  Not Normal _____ Comments: ______________________________________________________ Cholesterol: LDL(Bad cholesterol):         Your goal is less than:         HDL (Good cholesterol):       Your goal is more than: Comments:  ______________________________________________________ Mammogram:        Normal __X___  Not Normal _____ Comments:Repeat in one year.   ___________________________________________________________________ Hemoccult:        Normal _____  Not normal _______ Comments:    _____________________________________________________________________ Other Tests:    We routinely do not discuss normal results over the telephone.  If you desire a copy of the results, or you have any questions about this information we can discuss them at your next office visit.   Sincerely,    Idamae Schuller Randalyn Ahmed,MD  MT/ri

## 2010-06-15 NOTE — Progress Notes (Signed)
----   Converted from flag ---- ---- 02/22/2010 5:07 PM, Judith Part MD wrote: please check wellness/ lipid and vit d for v70.0 and 272 and 733.0 thanks  ---- 02/22/2010 4:10 PM, Melody Comas wrote: Patient coming in for cpx labs tomorrow. What labs to draw and diagnosis please. ------------------------------

## 2010-06-15 NOTE — Miscellaneous (Signed)
Summary: Vaccine Schedule/[facility]  Vaccine Schedule/[facility]   Imported By: Lanelle Bal 05/18/2009 13:19:39  _____________________________________________________________________  External Attachment:    Type:   Image     Comment:   External Document

## 2010-06-15 NOTE — Assessment & Plan Note (Signed)
Summary: ALLERGY SHOT/CLE  Nurse Visit   Allergies: No Known Drug Allergies  Medication Administration  Injection # 1:    Medication: Allergy Injection (1)    Diagnosis: ALLERGIC RHINITIS (ICD-477.9)    Route: SQ    Site: L deltoid    Exp Date: 05/01/2010    Mfr: Corinda [place] Allergy    Patient tolerated injection without complications    Given by: Mervin Hack CMA Duncan Dull) (January 28, 2010 9:24 AM)  Orders Added: 1)  Allergy Injection (1) [95115] 2)  Admin of patients own med IM/SQ [96372M]   Medication Administration  Injection # 1:    Medication: Allergy Injection (1)    Diagnosis: ALLERGIC RHINITIS (ICD-477.9)    Route: SQ    Site: L deltoid    Exp Date: 05/01/2010    Mfr: Corinda [place] Allergy    Patient tolerated injection without complications    Given by: Mervin Hack CMA Duncan Dull) (January 28, 2010 9:24 AM)  Orders Added: 1)  Allergy Injection (1) [95115] 2)  Admin of patients own med IM/SQ [phone]

## 2010-06-17 NOTE — Assessment & Plan Note (Signed)
Summary: ALLERGY SHOT/CLE  Nurse Visit   Allergies: No Known Drug Allergies  Medication Administration  Injection # 1:    Medication: Allergy Injection (1)    Diagnosis: ALLERGIC RHINITIS (ICD-477.9)    Route: SQ    Site: R deltoid    Exp Date: 05/07/2011    Lot #: TGDDM    Mfr: [place] Allergy    Comments: Patient provided her medication.    Patient tolerated injection without complications    Given by: Sydell Axon LPN (May 25, 2010 9:26 AM)  Orders Added: 1)  Admin of Therapeutic Inj  intramuscular or subcutaneous [96372]   Medication Administration  Injection # 1:    Medication: Allergy Injection (1)    Diagnosis: ALLERGIC RHINITIS (ICD-477.9)    Route: SQ    Site: R deltoid    Exp Date: 05/07/2011    Lot #: TGDDM    Mfr: [place] Allergy    Comments: Patient provided her medication.    Patient tolerated injection without complications    Given by: Sydell Axon LPN (May 25, 2010 9:26 AM)  Orders Added: 1)  Admin of Therapeutic Inj  intramuscular or subcutaneous [16109]

## 2010-06-17 NOTE — Assessment & Plan Note (Signed)
Summary: allergy shot/dlo  Nurse Visit   Allergies: No Known Drug Allergies  Medication Administration  Injection # 1:    Medication: Allergy Injection (1)    Diagnosis: ALLERGIC RHINITIS (ICD-477.9)    Route: SQ    Site: L deltoid    Exp Date: 05/01/2010    Lot #: TGDDM    Mfr: l[place] Allergy    Comments: Patient provided medicaiton    Patient tolerated injection without complications    Given by: Sydell Axon LPN (April 27, 2010 9:42 AM)  Orders Added: 1)  Admin of Therapeutic Inj  intramuscular or subcutaneous [96372]   Medication Administration  Injection # 1:    Medication: Allergy Injection (1)    Diagnosis: ALLERGIC RHINITIS (ICD-477.9)    Route: SQ    Site: L deltoid    Exp Date: 05/01/2010    Lot #: TGDDM    Mfr: l[place]Bauer Allergy    Comments: Patient provided medicaiton    Patient tolerated injection without complications    Given by: Sydell Axon LPN (April 27, 2010 9:42 AM)  Orders Added: 1)  Admin of Therapeutic Inj  intramuscular or subcutaneous [43329]

## 2010-06-23 NOTE — Assessment & Plan Note (Signed)
Summary: allergy shot/alc  Nurse Visit   Allergies: No Known Drug Allergies  Medication Administration  Injection # 1:    Medication: Allergy Injection (1)    Diagnosis: ALLERGIC RHINITIS (ICD-477.9)    Route: SQ    Site: R deltoid    Exp Date: 05/07/2011    Lot #: TGDDM    Mfr: [place] Allergy    Comments: 0.2 ml. Slight redness noted at injection site. No itching, swelling,SOB or hives noted after observation.     Patient tolerated injection without complications    Given by: Selena Batten Dance CMA Duncan Dull) (June 15, 2010 10:42 AM)  Orders Added: 1)  Allergy Injection (1) [95115] 2)  Admin of patients own med IM/SQ [phone]

## 2010-07-08 ENCOUNTER — Ambulatory Visit (INDEPENDENT_AMBULATORY_CARE_PROVIDER_SITE_OTHER): Payer: 59

## 2010-07-08 ENCOUNTER — Encounter: Payer: Self-pay | Admitting: Family Medicine

## 2010-07-08 DIAGNOSIS — J309 Allergic rhinitis, unspecified: Secondary | ICD-10-CM

## 2010-07-13 NOTE — Assessment & Plan Note (Signed)
Summary: ALLERGY SHOT/CLE  [facility]  Nurse Visit   Allergies: No Known Drug Allergies  Medication Administration  Injection # 1:    Medication: Allergy Injection (1)    Diagnosis: ALLERGIC RHINITIS (ICD-477.9)    Route: SQ    Site: L deltoid    Patient tolerated injection without complications    Given by: Lowella Petties CMA, AAMA (July 08, 2010 9:23 AM)  Orders Added: 1)  Admin of Therapeutic Inj  intramuscular or subcutaneous [[id number]]   Medication Administration  Injection # 1:    Medication: Allergy Injection (1)    Diagnosis: ALLERGIC RHINITIS (ICD-477.9)    Route: SQ    Site: L deltoid    Patient tolerated injection without complications    Given by: Lowella Petties CMA, AAMA (July 08, 2010 9:23 AM)  Orders Added: 1)  Admin of Therapeutic Inj  intramuscular or subcutaneous [[id number]]

## 2010-08-05 ENCOUNTER — Ambulatory Visit (INDEPENDENT_AMBULATORY_CARE_PROVIDER_SITE_OTHER): Payer: 59 | Admitting: Family Medicine

## 2010-08-05 DIAGNOSIS — J309 Allergic rhinitis, unspecified: Secondary | ICD-10-CM

## 2010-08-05 MED ORDER — NON FORMULARY
Freq: Once | Status: AC
  Administered 2010-08-05: 17:00:00 via SUBCUTANEOUS

## 2011-02-26 ENCOUNTER — Telehealth: Payer: Self-pay | Admitting: Family Medicine

## 2011-02-26 DIAGNOSIS — E7800 Pure hypercholesterolemia, unspecified: Secondary | ICD-10-CM

## 2011-02-26 DIAGNOSIS — Z8679 Personal history of other diseases of the circulatory system: Secondary | ICD-10-CM

## 2011-02-26 DIAGNOSIS — M899 Disorder of bone, unspecified: Secondary | ICD-10-CM

## 2011-02-26 DIAGNOSIS — Z0000 Encounter for general adult medical examination without abnormal findings: Secondary | ICD-10-CM

## 2011-02-26 DIAGNOSIS — I10 Essential (primary) hypertension: Secondary | ICD-10-CM

## 2011-02-26 NOTE — Telephone Encounter (Signed)
Message copied by Judy Pimple on Sat Feb 26, 2011 10:42 AM ------      Message from: Alvina Chou      Created: Thu Feb 24, 2011  9:23 AM      Regarding: labs for Tues 10-16       Patient is scheduled for CPX labs, please order future labs, Thanks , Camelia Eng

## 2011-03-01 ENCOUNTER — Other Ambulatory Visit (INDEPENDENT_AMBULATORY_CARE_PROVIDER_SITE_OTHER): Payer: 59

## 2011-03-01 DIAGNOSIS — Z0000 Encounter for general adult medical examination without abnormal findings: Secondary | ICD-10-CM

## 2011-03-01 DIAGNOSIS — E7800 Pure hypercholesterolemia, unspecified: Secondary | ICD-10-CM

## 2011-03-01 DIAGNOSIS — I10 Essential (primary) hypertension: Secondary | ICD-10-CM

## 2011-03-01 DIAGNOSIS — M949 Disorder of cartilage, unspecified: Secondary | ICD-10-CM

## 2011-03-01 LAB — COMPREHENSIVE METABOLIC PANEL
ALT: 30 U/L (ref 0–35)
AST: 26 U/L (ref 0–37)
Alkaline Phosphatase: 79 U/L (ref 39–117)
BUN: 19 mg/dL (ref 6–23)
Chloride: 106 mEq/L (ref 96–112)
Creatinine, Ser: 0.9 mg/dL (ref 0.4–1.2)

## 2011-03-01 LAB — CBC WITH DIFFERENTIAL/PLATELET
Basophils Relative: 0.5 % (ref 0.0–3.0)
Eosinophils Absolute: 0.2 10*3/uL (ref 0.0–0.7)
Hemoglobin: 14 g/dL (ref 12.0–15.0)
MCHC: 34.2 g/dL (ref 30.0–36.0)
MCV: 97.7 fl (ref 78.0–100.0)
Monocytes Absolute: 0.7 10*3/uL (ref 0.1–1.0)
Neutro Abs: 5.7 10*3/uL (ref 1.4–7.7)
Neutrophils Relative %: 64.4 % (ref 43.0–77.0)
RBC: 4.18 Mil/uL (ref 3.87–5.11)

## 2011-03-01 LAB — LIPID PANEL
HDL: 45.6 mg/dL (ref >39.00)
Total CHOL/HDL Ratio: 4
Triglycerides: 202 mg/dL — ABNORMAL HIGH (ref 0.0–149.0)
VLDL: 40.4 mg/dL — ABNORMAL HIGH (ref 0.0–40.0)

## 2011-03-01 LAB — TSH: TSH: 1.36 u[IU]/mL (ref 0.35–5.50)

## 2011-03-02 ENCOUNTER — Encounter: Payer: Self-pay | Admitting: Family Medicine

## 2011-03-02 LAB — VITAMIN D 25 HYDROXY (VIT D DEFICIENCY, FRACTURES): Vit D, 25-Hydroxy: 48 ng/mL (ref 30–89)

## 2011-03-04 ENCOUNTER — Encounter: Payer: Self-pay | Admitting: Family Medicine

## 2011-03-04 ENCOUNTER — Ambulatory Visit (INDEPENDENT_AMBULATORY_CARE_PROVIDER_SITE_OTHER): Payer: 59 | Admitting: Family Medicine

## 2011-03-04 VITALS — BP 126/72 | HR 60 | Temp 98.1°F | Ht 60.75 in | Wt 132.2 lb

## 2011-03-04 DIAGNOSIS — E7800 Pure hypercholesterolemia, unspecified: Secondary | ICD-10-CM

## 2011-03-04 DIAGNOSIS — Z87891 Personal history of nicotine dependence: Secondary | ICD-10-CM

## 2011-03-04 DIAGNOSIS — Z0000 Encounter for general adult medical examination without abnormal findings: Secondary | ICD-10-CM

## 2011-03-04 DIAGNOSIS — Z8679 Personal history of other diseases of the circulatory system: Secondary | ICD-10-CM

## 2011-03-04 DIAGNOSIS — Z01419 Encounter for gynecological examination (general) (routine) without abnormal findings: Secondary | ICD-10-CM | POA: Insufficient documentation

## 2011-03-04 DIAGNOSIS — Z23 Encounter for immunization: Secondary | ICD-10-CM

## 2011-03-04 MED ORDER — ATORVASTATIN CALCIUM 10 MG PO TABS: 10.0000 mg | ORAL_TABLET | Freq: Every day | ORAL | Status: DC

## 2011-03-04 MED ORDER — BISOPROLOL-HYDROCHLOROTHIAZIDE 10-6.25 MG PO TABS: 1.0000 | ORAL_TABLET | Freq: Every day | ORAL | Status: DC

## 2011-03-04 MED ORDER — ALPRAZOLAM 0.5 MG PO TABS: 0.5000 mg | ORAL_TABLET | Freq: Every day | ORAL | Status: DC | PRN

## 2011-03-04 NOTE — Progress Notes (Signed)
Subjective:    Patient ID: Fleet Contras, female    DOB: 25-Jun-1950, 60 y.o.   MRN: [id number]  HPI Here for annual health mt exam and to review chronic med problems  Has been feeling pretty good overall  No medical changes   More stress lately  Sister got real sick -- and died of CHF  -- ? etiol ( after sepsis after a surgery)  She died  Also planning wedding for her daughter  Also caring for sick mother  Has not had any xanax- wants a refill  - uses it just for emergencies  Would benefit from counseling -- but no time right now  husb helps a little - otherwise on her own    Wt stable with bmi of 25  HTN in good control 126/72  Osteopenia in past but last dexa was nl in 10/10 (good news) - with vit D level 48 Ca and D Last dexa - about a couple of years ago   Tab status- in process of trying to quit again   Lipids up a bit with LDL 127 Lab Results  Component Value Date   CHOL 198 03/01/2011   CHOL 190 02/23/2010   CHOL 188 05/19/2009   Lab Results  Component Value Date   HDL 45.60 03/01/2011   HDL 47.70 02/23/2010   HDL 44.90 05/19/2009   Lab Results  Component Value Date   LDLCALC 110* 02/23/2010   LDLCALC 107* 05/19/2009   LDLCALC 118* 02/16/2009   Lab Results  Component Value Date   TRIG 202.0* 03/01/2011   TRIG 164.0* 02/23/2010   TRIG 181.0* 05/19/2009   Lab Results  Component Value Date   CHOLHDL 4 03/01/2011   CHOLHDL 4 02/23/2010   CHOLHDL 4 05/19/2009   Lab Results  Component Value Date   LDLDIRECT 127.3 03/01/2011   Diet= has changed because eating out and eating on the run  Not getting enough exercise   Mam 12/11 normal - she will sched own mam  Self exam- no lumps , felt a skin bump at one time  Sister had breast cancer  colonosc 11/10 normal - 10 y recal No bowel problems    Tot hyst in past - for endometriosis  Has not seen gyn for a while - her gyn passed away  No abn paps before hyst  3 years since vaginal exam   Zoster status  Flu  shot- wants to get that  Td 03  -- wants Tdap today   Patient Active Problem List  Diagnoses  . HYPERCHOLESTEROLEMIA, PURE  . HYPERTENSION  . ALLERGIC RHINITIS  . HYPERTENSION, HX OF  . TOBACCO ABUSE, HX OF  . Routine general medical examination at a health care facility  . Gynecological examination   Past Medical History  Diagnosis Date  . Allergic rhinitis   . HTN (hypertension)   . HLD (hyperlipidemia)   . Tobacco abuse   . Family history of malignant neoplasm of breast   . Family history of other kidney diseases   . Disorder of bone and cartilage, unspecified    Past Surgical History  Procedure Date  . Total vaginal hysterectomy     endometriosis   History  Substance Use Topics  . Smoking status: Current Everyday Smoker  . Smokeless tobacco: Not on file  . Alcohol Use: Yes     Rare   Family History  Problem Relation Age of Onset  . Breast cancer Sister   . Hypertension Mother   .  Kidney disease Father   . Diabetes Paternal Aunt    No Known Allergies Current Outpatient Prescriptions on File Prior to Visit  Medication Sig Dispense Refill  . aspirin 81 MG tablet Take 81 mg by mouth daily.        . Multiple Vitamin (MULTIVITAMIN) tablet Take 1 tablet by mouth daily.        . budesonide (RHINOCORT AQUA) 32 MCG/ACT nasal spray Place 1 spray into the nose daily.        . fexofenadine (ALLEGRA) 60 MG tablet Take 60 mg by mouth 2 (two) times daily as needed.             Review of Systems Review of Systems  Constitutional: Negative for fever, appetite change, fatigue and unexpected weight change.  Eyes: Negative for pain and visual disturbance.  Respiratory: Negative for cough and shortness of breath.   Cardiovascular: Negative for cp or palpitations    Gastrointestinal: Negative for nausea, diarrhea and constipation.  Genitourinary: Negative for urgency and frequency.  Skin: Negative for pallor or rash   Neurological: Negative for weakness, light-headedness,  numbness and headaches.  Hematological: Negative for adenopathy. Does not bruise/bleed easily.  Psychiatric/Behavioral: Negative for dysphoric mood. The patient is not nervous/anxious.          Objective:   Physical Exam  Constitutional: She appears well-developed and well-nourished. No distress.  HENT:  Head: Normocephalic and atraumatic.  Right Ear: External ear normal.  Left Ear: External ear normal.  Nose: Nose normal.  Mouth/Throat: Oropharynx is clear and moist.  Eyes: Conjunctivae and EOM are normal. Pupils are equal, round, and reactive to light. No scleral icterus.  Neck: Normal range of motion. Neck supple. No JVD present. No thyromegaly present.  Cardiovascular: Normal rate, regular rhythm, normal heart sounds and intact distal pulses.   Pulmonary/Chest: Effort normal and breath sounds normal. No respiratory distress. She has no wheezes.  Abdominal: Soft. Bowel sounds are normal. She exhibits no distension and no mass. There is no tenderness.  Genitourinary: Vagina normal. No breast swelling, tenderness, discharge or bleeding. No vaginal discharge found.       S/p hyst  Exam done without pap  Musculoskeletal: Normal range of motion. She exhibits no edema and no tenderness.  Lymphadenopathy:    She has no cervical adenopathy.  Neurological: She is alert. She has normal reflexes. No cranial nerve deficit. Coordination normal.  Skin: Skin is warm and dry. No rash noted. No erythema. No pallor.  Psychiatric: She has a normal mood and affect.          Assessment & Plan:

## 2011-03-04 NOTE — Assessment & Plan Note (Signed)
Disc in detail risks of smoking and possible outcomes including copd, vascular/ heart disease, cancer , respiratory and sinus infections  Pt voices understanding  Given info about free program at the cancer center

## 2011-03-04 NOTE — Patient Instructions (Addendum)
Tdap and flu shots today  Use xanax with caution and update me if stress reaction worsens  Don't forget to schedule annual mammogram for dec Work on quitting smoking- think about going to the program at the cancer center  If you are interested in shingles vaccine- in a month - call insurance co about coverage - then if covered can get a a pharmacy Avoid red meat/ fried foods/ egg yolks/ fatty breakfast meats/ butter, cheese and high fat dairy/ and shellfish

## 2011-03-04 NOTE — Assessment & Plan Note (Signed)
Reviewed health habits including diet and exercise and skin cancer prevention Also reviewed health mt list, fam hx and immunizations  Rev wellness labs Flu and Tdap vaccines today Will call back about zoster vaccine  Urged to quit smoking

## 2011-03-04 NOTE — Assessment & Plan Note (Signed)
Chol up a little with worse diet  Continue lipitor Rev low sat fat diet and continue to monitor

## 2011-03-04 NOTE — Assessment & Plan Note (Signed)
bp in good control on current meds No changes Lab reviewed

## 2011-03-04 NOTE — Assessment & Plan Note (Signed)
Exam done without pap in light of past hysterectomy No problems or abn on exam

## 2011-04-22 ENCOUNTER — Encounter: Payer: Self-pay | Admitting: Family Medicine

## 2011-04-27 ENCOUNTER — Encounter: Payer: Self-pay | Admitting: *Deleted

## 2011-06-01 ENCOUNTER — Other Ambulatory Visit: Payer: Self-pay | Admitting: Internal Medicine

## 2011-06-01 MED ORDER — ALPRAZOLAM 0.5 MG PO TABS: 0.5000 mg | ORAL_TABLET | Freq: Every day | ORAL | Status: DC | PRN

## 2011-06-01 NOTE — Telephone Encounter (Signed)
Patient requested refill.  Last seen on 10.19.12

## 2011-06-01 NOTE — Telephone Encounter (Signed)
Medication phoned to [facility] pharmacy as instructed.  

## 2011-06-01 NOTE — Telephone Encounter (Signed)
Px written for call in   

## 2012-03-08 ENCOUNTER — Other Ambulatory Visit: Payer: Self-pay

## 2012-03-08 MED ORDER — ALPRAZOLAM 0.5 MG PO TABS: 0.5000 mg | ORAL_TABLET | Freq: Every day | ORAL | Status: DC | PRN

## 2012-03-08 NOTE — Telephone Encounter (Signed)
Px written for call in   

## 2012-03-08 NOTE — Telephone Encounter (Signed)
[facility] faxed request alprazolam. Last filled 09/14/11. Pt last seen 03/04/11 has CPX scheduled 04/20/12.Please advise.

## 2012-03-09 NOTE — Telephone Encounter (Signed)
Rx called in as prescribed 

## 2012-03-14 ENCOUNTER — Other Ambulatory Visit: Payer: Self-pay | Admitting: *Deleted

## 2012-03-14 MED ORDER — ATORVASTATIN CALCIUM 10 MG PO TABS: 10.0000 mg | ORAL_TABLET | Freq: Every day | ORAL | Status: DC

## 2012-03-15 ENCOUNTER — Other Ambulatory Visit: Payer: Self-pay | Admitting: *Deleted

## 2012-03-15 MED ORDER — BISOPROLOL-HYDROCHLOROTHIAZIDE 10-6.25 MG PO TABS: 1.0000 | ORAL_TABLET | Freq: Every day | ORAL | Status: DC

## 2012-03-15 NOTE — Telephone Encounter (Signed)
Pt scheduled for cpx in dec, rx refilled until then.

## 2012-04-20 ENCOUNTER — Encounter: Payer: Self-pay | Admitting: Family Medicine

## 2012-04-20 ENCOUNTER — Ambulatory Visit (INDEPENDENT_AMBULATORY_CARE_PROVIDER_SITE_OTHER): Payer: 59 | Admitting: Family Medicine

## 2012-04-20 VITALS — BP 122/86 | HR 62 | Temp 97.9°F | Ht 60.75 in | Wt 132.2 lb

## 2012-04-20 DIAGNOSIS — E7800 Pure hypercholesterolemia, unspecified: Secondary | ICD-10-CM

## 2012-04-20 DIAGNOSIS — Z0000 Encounter for general adult medical examination without abnormal findings: Secondary | ICD-10-CM

## 2012-04-20 DIAGNOSIS — Z8679 Personal history of other diseases of the circulatory system: Secondary | ICD-10-CM

## 2012-04-20 DIAGNOSIS — I10 Essential (primary) hypertension: Secondary | ICD-10-CM

## 2012-04-20 LAB — COMPREHENSIVE METABOLIC PANEL
ALT: 29 U/L (ref 0–35)
AST: 25 U/L (ref 0–37)
Alkaline Phosphatase: 73 U/L (ref 39–117)
CO2: 28 mEq/L (ref 19–32)
GFR: 79.26 mL/min (ref >60.00)
Sodium: 140 mEq/L (ref 135–145)
Total Bilirubin: 0.7 mg/dL (ref 0.3–1.2)
Total Protein: 7.8 g/dL (ref 6.0–8.3)

## 2012-04-20 LAB — LIPID PANEL
HDL: 48.5 mg/dL (ref >39.00)
LDL Cholesterol: 97 mg/dL (ref 0–99)
Total CHOL/HDL Ratio: 4
VLDL: 30.6 mg/dL (ref 0.0–40.0)

## 2012-04-20 LAB — CBC WITH DIFFERENTIAL/PLATELET
Basophils Absolute: 0.1 10*3/uL (ref 0.0–0.1)
HCT: 41.6 % (ref 36.0–46.0)
Lymphs Abs: 2.3 10*3/uL (ref 0.7–4.0)
Monocytes Relative: 8.4 % (ref 3.0–12.0)
Platelets: 240 10*3/uL (ref 150.0–400.0)
RDW: 13.2 % (ref 11.5–14.6)

## 2012-04-20 MED ORDER — ZOSTER VACCINE LIVE 19400 UNT/0.65ML ~~LOC~~ SOLR: 0.6500 mL | Freq: Once | SUBCUTANEOUS | Status: DC

## 2012-04-20 MED ORDER — BUDESONIDE 32 MCG/ACT NA SUSP: 1.0000 | Freq: Every day | NASAL | Status: DC

## 2012-04-20 MED ORDER — BISOPROLOL-HYDROCHLOROTHIAZIDE 10-6.25 MG PO TABS: 1.0000 | ORAL_TABLET | Freq: Every day | ORAL | Status: DC

## 2012-04-20 MED ORDER — ATORVASTATIN CALCIUM 10 MG PO TABS: 10.0000 mg | ORAL_TABLET | Freq: Every day | ORAL | Status: DC

## 2012-04-20 NOTE — Assessment & Plan Note (Signed)
bp in fair control at this time  No changes needed  Disc lifstyle change with low sodium diet and exercise  Med refilled-ziac Lab today

## 2012-04-20 NOTE — Assessment & Plan Note (Signed)
On lipitor and good diet- lab today Rev low sat fat diet Habits are good

## 2012-04-20 NOTE — Patient Instructions (Addendum)
I'm glad you are doing well  Goal for exercise - 5 days per week for at least 20 minutes  Get you shingles vaccine at the pharmacy  Get your mammogram  Labs today

## 2012-04-20 NOTE — Progress Notes (Signed)
  Subjective:    Patient ID: Jamie Dillon, female    DOB: [birth year], 61 y.o.   MRN: [id number]  HPI Here for health maintenance exam and to review chronic medical problems    Nothing new going on  Feeling pretty good overall Still a non smoker- proud of that   Wt is stable with bmi of 25  Had total hysterectomy- for endometriosis  Never had abn pap smears No gyn problems at all    mammo 12/12- she goes to [facility]- has her appt scheduled next tues  Self exam-no lumps or changes   Zoster status - needs a px to take to [facility] for that  Flu vaccine - had it in oct  colonosc 11/10- 10 year recall   bp is stable today  No cp or palpitations or headaches or edema  No side effects to medicines  BP Readings from Last 3 Encounters:  04/20/12 122/86  03/04/11 126/72  03/01/10 132/84      Hyperlipidemia  Needs lab Lab Results  Component Value Date   CHOL 198 03/01/2011   HDL 45.60 03/01/2011   LDLCALC 110* 02/23/2010   LDLDIRECT 127.3 03/01/2011   TRIG 202.0* 03/01/2011   CHOLHDL 4 03/01/2011       Review of Systems Review of Systems  Constitutional: Negative for fever, appetite change, fatigue and unexpected weight change.  Eyes: Negative for pain and visual disturbance.  Respiratory: Negative for cough and shortness of breath.   Cardiovascular: Negative for cp or palpitations    Gastrointestinal: Negative for nausea, diarrhea and constipation.  Genitourinary: Negative for urgency and frequency.  Skin: Negative for pallor or rash   Neurological: Negative for weakness, light-headedness, numbness and headaches.  Hematological: Negative for adenopathy. Does not bruise/bleed easily.  Psychiatric/Behavioral: Negative for dysphoric mood. The patient is not nervous/anxious.         Objective:   Physical Exam  Constitutional: She appears well-developed and well-nourished. No distress.  HENT:  Head: Normocephalic and atraumatic.  Right Ear: External ear normal.   Left Ear: External ear normal.  Nose: Nose normal.  Mouth/Throat: Oropharynx is clear and moist.  Eyes: Conjunctivae normal and EOM are normal. Pupils are equal, round, and reactive to light. Right eye exhibits no discharge. Left eye exhibits no discharge. No scleral icterus.  Neck: Normal range of motion. Neck supple. No JVD present. Carotid bruit is not present. No thyromegaly present.  Cardiovascular: Normal rate, regular rhythm, normal heart sounds and intact distal pulses.  Exam reveals no gallop.   Pulmonary/Chest: Effort normal and breath sounds normal. No respiratory distress. She has no wheezes.  Abdominal: Soft. Bowel sounds are normal. She exhibits no distension, no abdominal bruit and no mass. There is no tenderness.  Genitourinary: No breast swelling, tenderness, discharge or bleeding.       Breast exam: No mass, nodules, thickening, tenderness, bulging, retraction, inflamation, nipple discharge or skin changes noted.  No axillary or clavicular LA.  Chaperoned exam.    Musculoskeletal: She exhibits no edema and no tenderness.  Lymphadenopathy:    She has no cervical adenopathy.  Neurological: She is alert. She has normal reflexes. No cranial nerve deficit. She exhibits normal muscle tone. Coordination normal.  Skin: Skin is warm and dry. No rash noted. No erythema. No pallor.  Psychiatric: She has a normal mood and affect.          Assessment & Plan:

## 2012-04-20 NOTE — Assessment & Plan Note (Signed)
Reviewed health habits including diet and exercise and skin cancer prevention Also reviewed health mt list, fam hx and immunizations  Had hyst total- so no pap Quit smoking- commended Given px for zoster vaccine

## 2012-04-25 ENCOUNTER — Encounter: Payer: Self-pay | Admitting: Family Medicine

## 2012-04-26 ENCOUNTER — Encounter: Payer: Self-pay | Admitting: *Deleted

## 2012-05-01 ENCOUNTER — Telehealth: Payer: Self-pay | Admitting: Family Medicine

## 2012-05-01 NOTE — Telephone Encounter (Signed)
I looked at labs an you had put "will discuss at pt's upcomming f/u appt" but pt doesn't have a up coming appt., the appt in our system is for Dec. 2014, please advise

## 2012-05-01 NOTE — Telephone Encounter (Signed)
Pt left v/m stating that she had lab work on 12/6 and has not heard anything back yet.

## 2012-05-01 NOTE — Telephone Encounter (Signed)
Pt notified of lab results and a copy was mailed to her

## 2012-05-01 NOTE — Telephone Encounter (Signed)
Sorry about that - labs are fine and cholesterol is improved  Please mail her a copy if she wants it

## 2012-06-08 ENCOUNTER — Other Ambulatory Visit: Payer: Self-pay | Admitting: *Deleted

## 2012-08-27 ENCOUNTER — Other Ambulatory Visit: Payer: Self-pay | Admitting: Family Medicine

## 2012-08-27 MED ORDER — ALPRAZOLAM 0.5 MG PO TABS: 0.5000 mg | ORAL_TABLET | Freq: Every day | ORAL | Status: DC | PRN

## 2012-08-27 NOTE — Telephone Encounter (Signed)
Px written for call in   

## 2012-08-27 NOTE — Telephone Encounter (Signed)
Last filled 07/01/2012.

## 2012-08-28 NOTE — Telephone Encounter (Signed)
Phoned in to pharmacy. 

## 2013-02-07 ENCOUNTER — Other Ambulatory Visit: Payer: Self-pay | Admitting: *Deleted

## 2013-02-07 MED ORDER — BISOPROLOL-HYDROCHLOROTHIAZIDE 10-6.25 MG PO TABS: 1.0000 | ORAL_TABLET | Freq: Every day | ORAL | Status: DC

## 2013-04-22 ENCOUNTER — Ambulatory Visit (INDEPENDENT_AMBULATORY_CARE_PROVIDER_SITE_OTHER): Payer: 59 | Admitting: Family Medicine

## 2013-04-22 ENCOUNTER — Encounter: Payer: Self-pay | Admitting: Family Medicine

## 2013-04-22 VITALS — BP 128/80 | HR 64 | Temp 98.3°F | Ht 60.75 in | Wt 135.5 lb

## 2013-04-22 DIAGNOSIS — I10 Essential (primary) hypertension: Secondary | ICD-10-CM

## 2013-04-22 DIAGNOSIS — E7800 Pure hypercholesterolemia, unspecified: Secondary | ICD-10-CM

## 2013-04-22 DIAGNOSIS — Z0000 Encounter for general adult medical examination without abnormal findings: Secondary | ICD-10-CM

## 2013-04-22 LAB — COMPREHENSIVE METABOLIC PANEL
ALT: 32 U/L (ref 0–35)
AST: 28 U/L (ref 0–37)
Alkaline Phosphatase: 64 U/L (ref 39–117)
Calcium: 9.6 mg/dL (ref 8.4–10.5)
Chloride: 105 mEq/L (ref 96–112)
Creatinine, Ser: 0.8 mg/dL (ref 0.4–1.2)
GFR: 72.53 mL/min (ref >60.00)
Potassium: 3.9 mEq/L (ref 3.5–5.1)
Total Protein: 7.8 g/dL (ref 6.0–8.3)

## 2013-04-22 LAB — CBC WITH DIFFERENTIAL/PLATELET
Basophils Absolute: 0 10*3/uL (ref 0.0–0.1)
Basophils Relative: 0.6 % (ref 0.0–3.0)
Eosinophils Absolute: 0.2 10*3/uL (ref 0.0–0.7)
HCT: 40.8 % (ref 36.0–46.0)
Lymphocytes Relative: 29.9 % (ref 12.0–46.0)
Lymphs Abs: 2.2 10*3/uL (ref 0.7–4.0)
MCHC: 34.2 g/dL (ref 30.0–36.0)
Monocytes Absolute: 0.5 10*3/uL (ref 0.1–1.0)
Neutro Abs: 4.4 10*3/uL (ref 1.4–7.7)
Neutrophils Relative %: 60 % (ref 43.0–77.0)
Platelets: 281 10*3/uL (ref 150.0–400.0)
RBC: 4.29 Mil/uL (ref 3.87–5.11)
RDW: 13.1 % (ref 11.5–14.6)

## 2013-04-22 LAB — LIPID PANEL
LDL Cholesterol: 116 mg/dL — ABNORMAL HIGH (ref 0–99)
Total CHOL/HDL Ratio: 4
Triglycerides: 131 mg/dL (ref 0.0–149.0)
VLDL: 26.2 mg/dL (ref 0.0–40.0)

## 2013-04-22 MED ORDER — ATORVASTATIN CALCIUM 10 MG PO TABS: 10.0000 mg | ORAL_TABLET | Freq: Every day | ORAL | Status: DC

## 2013-04-22 MED ORDER — ALPRAZOLAM 0.5 MG PO TABS: 0.5000 mg | ORAL_TABLET | Freq: Every day | ORAL | Status: DC | PRN

## 2013-04-22 MED ORDER — BISOPROLOL-HYDROCHLOROTHIAZIDE 10-6.25 MG PO TABS: 1.0000 | ORAL_TABLET | Freq: Every day | ORAL | Status: DC

## 2013-04-22 NOTE — Progress Notes (Signed)
Subjective:    Patient ID: Jamie Dillon, female    DOB: 01-29-1951, 62 y.o.   MRN: [id number]  HPI Here for health maintenance exam and to review chronic medical problems    Doing well overall   occ stomach problems -- feels like a pulled muscle - actually better now  No other GI symptoms  A little gas No change in bowel habits  No urinary problems   Wt is up 3 lb with bmi of 25 She thinks this is correct She thinks it is from anxiety- eating more (has had to take over her mother's care)  A lot of stress this year- thinks she is dealing with it ok  Xanax - takes it occasionally  Declines counseling   Zoster status-- took the vaccine Jan 2014  Pap 09 Hysterectomy- it was for endometriosis and polyps  Is much better now -no gyn symptoms  Flu vaccine - had it in oct-utd   Mammogram 12/13- she has it set up next week  Self exam- no lumps or changes  Sister had breast cancer , no other family hx   colonosc 11/10- 10 year recall was nl   Td 10/12  Lab Results  Component Value Date   CHOL 176 04/20/2012   HDL 48.50 04/20/2012   LDLCALC 97 04/20/2012   LDLDIRECT 127.3 03/01/2011   TRIG 153.0* 04/20/2012   CHOLHDL 4 04/20/2012     bp is stable today  No cp or palpitations or headaches or edema  No side effects to medicines  BP Readings from Last 3 Encounters:  04/22/13 128/80  04/20/12 122/86  03/04/11 126/72      Patient Active Problem List   Diagnosis Date Noted  . Gynecological examination 03/04/2011  . Routine general medical examination at a health care facility 02/26/2011  . HYPERCHOLESTEROLEMIA, PURE 02/01/2007  . HYPERTENSION 09/08/2006  . ALLERGIC RHINITIS 09/08/2006  . TOBACCO ABUSE, HX OF 09/08/2006   Past Medical History  Diagnosis Date  . Allergic rhinitis   . HTN (hypertension)   . HLD (hyperlipidemia)   . Tobacco abuse   . Family history of malignant neoplasm of breast   . Family history of other kidney diseases   . Disorder of bone and  cartilage, unspecified    Past Surgical History  Procedure Laterality Date  . Total vaginal hysterectomy      endometriosis   History  Substance Use Topics  . Smoking status: Former Games developer  . Smokeless tobacco: Not on file  . Alcohol Use: Yes     Comment: occasional wine   Family History  Problem Relation Age of Onset  . Breast cancer Sister   . Hypertension Mother   . Kidney disease Father   . Diabetes Paternal Aunt    No Known Allergies Current Outpatient Prescriptions on File Prior to Visit  Medication Sig Dispense Refill  . ALPRAZolam (XANAX) 0.5 MG tablet Take 1 tablet (0.5 mg total) by mouth daily as needed for anxiety.  30 tablet  1  . aspirin 81 MG tablet Take 81 mg by mouth daily.        Marland Kitchen atorvastatin (LIPITOR) 10 MG tablet Take 1 tablet (10 mg total) by mouth daily.  30 tablet  11  . b complex vitamins tablet Take 1 tablet by mouth daily.        . bisoprolol-hydrochlorothiazide (ZIAC) 10-6.25 MG per tablet Take 1 tablet by mouth daily.  30 tablet  3  . budesonide (RHINOCORT AQUA) 32 MCG/ACT  nasal spray Place 1 spray into the nose daily.  1 Bottle  11  . Calcium Carbonate-Vitamin D (CALCIUM-VITAMIN D) 500-200 MG-UNIT per tablet Take 3 tablets by mouth every morning.        . diphenhydrAMINE (BENADRYL) 25 MG tablet Take 25 mg by mouth daily as needed.        . fexofenadine (ALLEGRA) 60 MG tablet Take 60 mg by mouth 2 (two) times daily as needed.        . Multiple Vitamin (MULTIVITAMIN) tablet Take 1 tablet by mouth daily.        Marland Kitchen zoster vaccine live, PF, (ZOSTAVAX) 16109 UNT/0.65ML injection Inject 19,400 Units into the skin once.  1 vial  0   No current facility-administered medications on file prior to visit.      Review of Systems Review of Systems  Constitutional: Negative for fever, appetite change, fatigue and unexpected weight change.  Eyes: Negative for pain and visual disturbance.  Respiratory: Negative for cough and shortness of breath.     Cardiovascular: Negative for cp or palpitations    Gastrointestinal: Negative for nausea, diarrhea and constipation. pos for some abd soreness that is better now  Genitourinary: Negative for urgency and frequency. neg for blood in urine or flank pain  Skin: Negative for pallor or rash   Neurological: Negative for weakness, light-headedness, numbness and headaches.  Hematological: Negative for adenopathy. Does not bruise/bleed easily.  Psychiatric/Behavioral: Negative for dysphoric mood. The patient is not nervous/anxious.         Objective:   Physical Exam  Constitutional: She appears well-developed and well-nourished. No distress.  HENT:  Head: Normocephalic and atraumatic.  Right Ear: External ear normal.  Left Ear: External ear normal.  Mouth/Throat: Oropharynx is clear and moist.  Eyes: Conjunctivae and EOM are normal. Pupils are equal, round, and reactive to light. No scleral icterus.  Neck: Normal range of motion. Neck supple. No JVD present. Carotid bruit is not present. No thyromegaly present.  Cardiovascular: Normal rate, regular rhythm, normal heart sounds and intact distal pulses.  Exam reveals no gallop.   Pulmonary/Chest: Effort normal and breath sounds normal. No respiratory distress. She has no wheezes. She exhibits no tenderness.  Abdominal: Soft. Bowel sounds are normal. She exhibits no distension, no abdominal bruit and no mass. There is no tenderness.  Genitourinary: No breast swelling, tenderness, discharge or bleeding.  Breast exam: No mass, nodules, thickening, tenderness, bulging, retraction, inflamation, nipple discharge or skin changes noted.  No axillary or clavicular LA.    Musculoskeletal: Normal range of motion. She exhibits no edema and no tenderness.  Lymphadenopathy:    She has no cervical adenopathy.  Neurological: She is alert. She has normal reflexes. No cranial nerve deficit. She exhibits normal muscle tone. Coordination normal.  Skin: Skin is warm  and dry. No rash noted. No erythema. No pallor.  Some lentigos   Psychiatric: She has a normal mood and affect.          Assessment & Plan:

## 2013-04-22 NOTE — Assessment & Plan Note (Signed)
Lipid panel today Disc goals for lipids and reasons to control them Rev labs with pt from last year On lipitor and diet  Rev low sat fat diet in detail

## 2013-04-22 NOTE — Assessment & Plan Note (Signed)
BP: 128/80 mmHg  bp in fair control at this time  No changes needed Disc lifstyle change with low sodium diet and exercise   Lab today

## 2013-04-22 NOTE — Assessment & Plan Note (Signed)
Reviewed health habits including diet and exercise and skin cancer prevention Reviewed appropriate screening tests for age  Also reviewed health mt list, fam hx and immunization status , as well as social and family history   See HPI Wellness labs today 

## 2013-04-22 NOTE — Patient Instructions (Signed)
If stomach issues get worse - please let me know  Labs today Take care of yourself and stick to healthy diet and exercise

## 2013-04-22 NOTE — Progress Notes (Signed)
Pre visit review using our clinic review tool, if applicable. No additional management support is needed unless otherwise documented below in the visit note. 

## 2013-04-23 ENCOUNTER — Encounter: Payer: Self-pay | Admitting: *Deleted

## 2013-04-30 ENCOUNTER — Other Ambulatory Visit: Payer: Self-pay | Admitting: Family Medicine

## 2013-05-01 ENCOUNTER — Encounter: Payer: Self-pay | Admitting: Family Medicine

## 2013-05-02 ENCOUNTER — Encounter: Payer: Self-pay | Admitting: *Deleted

## 2013-08-28 ENCOUNTER — Other Ambulatory Visit: Payer: Self-pay | Admitting: Family Medicine

## 2013-08-30 ENCOUNTER — Other Ambulatory Visit: Payer: Self-pay | Admitting: Family Medicine

## 2013-12-31 ENCOUNTER — Encounter: Payer: Self-pay | Admitting: Gastroenterology

## 2014-01-03 ENCOUNTER — Encounter: Payer: Self-pay | Admitting: Family Medicine

## 2014-01-03 ENCOUNTER — Ambulatory Visit (INDEPENDENT_AMBULATORY_CARE_PROVIDER_SITE_OTHER): Payer: 59 | Admitting: Family Medicine

## 2014-01-03 VITALS — BP 160/106 | HR 60 | Temp 98.2°F | Ht 60.75 in | Wt 135.5 lb

## 2014-01-03 DIAGNOSIS — I499 Cardiac arrhythmia, unspecified: Secondary | ICD-10-CM

## 2014-01-03 DIAGNOSIS — I10 Essential (primary) hypertension: Secondary | ICD-10-CM

## 2014-01-03 DIAGNOSIS — F430 Acute stress reaction: Secondary | ICD-10-CM | POA: Insufficient documentation

## 2014-01-03 DIAGNOSIS — R002 Palpitations: Secondary | ICD-10-CM

## 2014-01-03 MED ORDER — METOPROLOL SUCCINATE ER 50 MG PO TB24: 50.0000 mg | ORAL_TABLET | Freq: Every day | ORAL | Status: DC

## 2014-01-03 NOTE — Patient Instructions (Signed)
Stop your ziac Start metoprolol xl 50 mg one pill daily -starting tomorrow  Focus on self care and get as much help as you can at home (you may need to confronting your mother about her excessive demands) Stop at check out for counseling referral  Stop caffeine entirely  Follow up with me next week please  If palpitations worsen or chest pain or other symptoms-go to the ER

## 2014-01-03 NOTE — Progress Notes (Signed)
Pre visit review using our clinic review tool, if applicable. No additional management support is needed unless otherwise documented below in the visit note. 

## 2014-01-03 NOTE — Assessment & Plan Note (Signed)
bp up here and at allergist office Some anxiety/ stress reaction also - tends to be better when relaxed  Will change ziac to toprol xl 50 mg daily F/u next week  Will address stress reaction  She will also watch bp

## 2014-01-03 NOTE — Assessment & Plan Note (Signed)
Noted at allergy office  None today Nl EKG with NSR and rate of 69  Disc stress rxn/ anx -this is when she has symptoms -will address that  Stop caffeine  F/u next week If recurrent- eval further

## 2014-01-03 NOTE — Progress Notes (Signed)
Subjective:    Patient ID: Jamie Dillon, female    DOB: 1950/05/29, 63 y.o.   MRN: [id number]  HPI Here for f/u of an irregular HR noted when she was in her allergist's office  Also inc bp   She does feel some palpitations - she desc it as "skipping a beat"  This happens when she is anxious   She is staying anxious all the time now - with more stressors  Caring for her mother - very frustrating  husb was in the hospital - with MRSA  Just cannot relax  Coping skills- goes for a walk (never gets a chance to do this)  Not a lot of help caring for her mother - has hired someone but her mother is still very demanding  She knows she needs to stand up to her -is difficult   Her brain won't shut off   She has always been anxious in nature- but getting worse over the years  Years and years ago - she took paxil and that did not agree with her and made her gain weight   Patient Active Problem List   Diagnosis Date Noted  . Gynecological examination 03/04/2011  . Routine general medical examination at a health care facility 02/26/2011  . HYPERCHOLESTEROLEMIA, PURE 02/01/2007  . HYPERTENSION 09/08/2006  . ALLERGIC RHINITIS 09/08/2006  . TOBACCO ABUSE, HX OF 09/08/2006   Past Medical History  Diagnosis Date  . Allergic rhinitis   . HTN (hypertension)   . HLD (hyperlipidemia)   . Tobacco abuse   . Family history of malignant neoplasm of breast   . Family history of other kidney diseases   . Disorder of bone and cartilage, unspecified    Past Surgical History  Procedure Laterality Date  . Total vaginal hysterectomy      endometriosis   History  Substance Use Topics  . Smoking status: Former Research scientist (life sciences)  . Smokeless tobacco: Not on file  . Alcohol Use: Yes     Comment: occasional wine   Family History  Problem Relation Age of Onset  . Breast cancer Sister   . Hypertension Mother   . Kidney disease Father   . Diabetes Paternal Aunt    No Known Allergies Current Outpatient  Prescriptions on File Prior to Visit  Medication Sig Dispense Refill  . ALPRAZolam (XANAX) 0.5 MG tablet Take 1 tablet (0.5 mg total) by mouth daily as needed for anxiety.  30 tablet  1  . aspirin 81 MG tablet Take 81 mg by mouth daily.        Marland Kitchen atorvastatin (LIPITOR) 10 MG tablet Take 1 tablet (10 mg total) by mouth daily.  30 tablet  11  . b complex vitamins tablet Take 1 tablet by mouth daily.       . bisoprolol-hydrochlorothiazide (ZIAC) 10-6.25 MG per tablet TAKE 1 TABLET BY MOUTH DAILY.  30 tablet  5  . Calcium Carbonate-Vitamin D (CALCIUM-VITAMIN D) 500-200 MG-UNIT per tablet Take 3 tablets by mouth every morning.        . diphenhydrAMINE (BENADRYL) 25 MG tablet Take 25 mg by mouth daily as needed.        . fexofenadine (ALLEGRA) 60 MG tablet Take 60 mg by mouth 2 (two) times daily as needed.        . Multiple Vitamin (MULTIVITAMIN) tablet Take 1 tablet by mouth daily.         No current facility-administered medications on file prior to visit.  Review of Systems Review of Systems  Constitutional: Negative for fever, appetite change, fatigue and unexpected weight change.  Eyes: Negative for pain and visual disturbance.  Respiratory: Negative for cough and shortness of breath.   Cardiovascular: Negative for cp and pos for  palpitations   neg for pedal edema or PND or orthopnea  Gastrointestinal: Negative for nausea, diarrhea and constipation.  Genitourinary: Negative for urgency and frequency.  Skin: Negative for pallor or rash   Neurological: Negative for weakness, light-headedness, numbness and headaches.  Hematological: Negative for adenopathy. Does not bruise/bleed easily.  Psychiatric/Behavioral: Negative for dysphoric mood. Pos for anxiety and nervousness         Objective:   Physical Exam  Constitutional: She appears well-developed and well-nourished. No distress.  HENT:  Head: Normocephalic and atraumatic.  Mouth/Throat: Oropharynx is clear and moist.  Eyes:  Conjunctivae and EOM are normal. Pupils are equal, round, and reactive to light. No scleral icterus.  Neck: Normal range of motion. Neck supple. No JVD present. Carotid bruit is not present. No thyromegaly present.  Cardiovascular: Normal rate and regular rhythm.   Pulmonary/Chest: Effort normal and breath sounds normal.  Abdominal: She exhibits no abdominal bruit.  Musculoskeletal: She exhibits no edema.  Lymphadenopathy:    She has no cervical adenopathy.  Neurological: She is alert. She has normal reflexes. No cranial nerve deficit. She exhibits normal muscle tone. Coordination normal.  Skin: Skin is warm and dry. No rash noted. No erythema. No pallor.  Psychiatric: Her speech is normal and behavior is normal. Thought content normal. Her mood appears anxious. Her affect is not angry, not blunt, not labile and not inappropriate. She does not exhibit a depressed mood.  Somewhat anxious/ fatigued appearing  Not tearful          Assessment & Plan:   Problem List Items Addressed This Visit     Cardiovascular and Mediastinum   HYPERTENSION     bp up here and at allergist office Some anxiety/ stress reaction also - tends to be better when relaxed  Will change ziac to toprol xl 50 mg daily F/u next week  Will address stress reaction  She will also watch bp    Relevant Medications      metoprolol succinate (TOPROL-XL) 24 hr tablet     Other   Stress reaction - Primary     Long disc re: stressors and hx of anxiety Reviewed stressors/ coping techniques/symptoms/ support sources/ tx options and side effects in detail today  Will ref to counseling She will have family mtg re: caring for her mother as well  Needs time for self care  >25 minutes spent in face to face time with patient, >50% spent in counselling or coordination of care     Relevant Orders      Ambulatory referral to Psychology   Palpitations     Noted at allergy office  None today Nl EKG with NSR and rate of 69    Disc stress rxn/ anx -this is when she has symptoms -will address that  Stop caffeine  F/u next week If recurrent- eval further      Other Visit Diagnoses   Irregular heart beat        Relevant Orders       EKG 12-Lead (Completed)

## 2014-01-03 NOTE — Assessment & Plan Note (Addendum)
Long disc re: stressors and hx of anxiety Reviewed stressors/ coping techniques/symptoms/ support sources/ tx options and side effects in detail today  Will ref to counseling She will have family mtg re: caring for her mother as well  Needs time for self care  >25 minutes spent in face to face time with patient, >50% spent in counselling or coordination of care

## 2014-01-06 ENCOUNTER — Telehealth: Payer: Self-pay | Admitting: Family Medicine

## 2014-01-06 NOTE — Telephone Encounter (Signed)
Relevant patient education assigned to patient using Emmi. ° °

## 2014-01-10 ENCOUNTER — Encounter: Payer: Self-pay | Admitting: Family Medicine

## 2014-01-10 ENCOUNTER — Ambulatory Visit (INDEPENDENT_AMBULATORY_CARE_PROVIDER_SITE_OTHER): Payer: 59 | Admitting: Family Medicine

## 2014-01-10 VITALS — BP 126/90 | HR 65 | Temp 98.6°F | Ht 60.75 in | Wt 134.5 lb

## 2014-01-10 DIAGNOSIS — R002 Palpitations: Secondary | ICD-10-CM

## 2014-01-10 DIAGNOSIS — F430 Acute stress reaction: Secondary | ICD-10-CM

## 2014-01-10 DIAGNOSIS — H6980 Other specified disorders of Eustachian tube, unspecified ear: Secondary | ICD-10-CM | POA: Insufficient documentation

## 2014-01-10 DIAGNOSIS — I10 Essential (primary) hypertension: Secondary | ICD-10-CM

## 2014-01-10 DIAGNOSIS — H6981 Other specified disorders of Eustachian tube, right ear: Secondary | ICD-10-CM

## 2014-01-10 MED ORDER — FLUTICASONE PROPIONATE 50 MCG/ACT NA SUSP: 2.0000 | Freq: Every day | NASAL | Status: DC

## 2014-01-10 MED ORDER — METOPROLOL SUCCINATE ER 50 MG PO TB24: 50.0000 mg | ORAL_TABLET | Freq: Every day | ORAL | Status: DC

## 2014-01-10 MED ORDER — ALPRAZOLAM 0.5 MG PO TABS: 0.5000 mg | ORAL_TABLET | Freq: Every day | ORAL | Status: DC | PRN

## 2014-01-10 NOTE — Progress Notes (Signed)
Subjective:    Patient ID: Jamie Dillon, female    DOB: 1950-06-09, 63 y.o.   MRN: [id number]  HPI Here for f/u of HTN and also palpitations   bp is down today  No cp or palpitations or headaches or edema  No side effects to medicines  BP Readings from Last 3 Encounters:  01/10/14 126/90  01/03/14 160/106  04/22/13 128/80     Feels a bit sluggish on the toprol XL - HR is 65 No more palpitations   This med also seems to help her anxiety symptoms - feels a little counselor   Will see Rosaria Ferries today re: counseling referral   Had an ear infx on R - took abx Still feels like it is full of fluid  Finished it yesterday   Has been taking zyrtec for allergies  No nasal sprays currently   Patient Active Problem List   Diagnosis Date Noted  . Stress reaction 01/03/2014  . Palpitations 01/03/2014  . Gynecological examination 03/04/2011  . Routine general medical examination at a health care facility 02/26/2011  . HYPERCHOLESTEROLEMIA, PURE 02/01/2007  . HYPERTENSION 09/08/2006  . ALLERGIC RHINITIS 09/08/2006  . TOBACCO ABUSE, HX OF 09/08/2006   Past Medical History  Diagnosis Date  . Allergic rhinitis   . HTN (hypertension)   . HLD (hyperlipidemia)   . Tobacco abuse   . Family history of malignant neoplasm of breast   . Family history of other kidney diseases   . Disorder of bone and cartilage, unspecified    Past Surgical History  Procedure Laterality Date  . Total vaginal hysterectomy      endometriosis   History  Substance Use Topics  . Smoking status: Former Research scientist (life sciences)  . Smokeless tobacco: Not on file  . Alcohol Use: Yes     Comment: occasional wine   Family History  Problem Relation Age of Onset  . Breast cancer Sister   . Hypertension Mother   . Kidney disease Father   . Diabetes Paternal Aunt    No Known Allergies Current Outpatient Prescriptions on File Prior to Visit  Medication Sig Dispense Refill  . ALPRAZolam (XANAX) 0.5 MG tablet Take 1 tablet  (0.5 mg total) by mouth daily as needed for anxiety.  30 tablet  1  . aspirin 81 MG tablet Take 81 mg by mouth daily.        Marland Kitchen atorvastatin (LIPITOR) 10 MG tablet Take 1 tablet (10 mg total) by mouth daily.  30 tablet  11  . b complex vitamins tablet Take 1 tablet by mouth daily.       . Calcium Carbonate-Vitamin D (CALCIUM-VITAMIN D) 500-200 MG-UNIT per tablet Take 3 tablets by mouth every morning.        . diphenhydrAMINE (BENADRYL) 25 MG tablet Take 25 mg by mouth daily as needed.        . fexofenadine (ALLEGRA) 60 MG tablet Take 60 mg by mouth 2 (two) times daily as needed.        . metoprolol succinate (TOPROL-XL) 50 MG 24 hr tablet Take 1 tablet (50 mg total) by mouth daily. Take with or immediately following a meal.  30 tablet  3  . Multiple Vitamin (MULTIVITAMIN) tablet Take 1 tablet by mouth daily.         No current facility-administered medications on file prior to visit.     Review of Systems Review of Systems  Constitutional: Negative for fever, appetite change,  and unexpected weight change. pos for  mild sluggishness Eyes: Negative for pain and visual disturbance.  Respiratory: Negative for cough and shortness of breath.   Cardiovascular: Negative for cp or palpitations    Gastrointestinal: Negative for nausea, diarrhea and constipation.  Genitourinary: Negative for urgency and frequency.  Skin: Negative for pallor or rash   Neurological: Negative for weakness, light-headedness, numbness and headaches.  Hematological: Negative for adenopathy. Does not bruise/bleed easily.  Psychiatric/Behavioral: Negative for dysphoric mood. The patient is anxious, pos for stressors        Objective:   Physical Exam  Constitutional: She appears well-nourished. No distress.  HENT:  Head: Normocephalic and atraumatic.  Mouth/Throat: Oropharynx is clear and moist.  Eyes: Conjunctivae and EOM are normal. Pupils are equal, round, and reactive to light. No scleral icterus.  Neck: Normal  range of motion. Neck supple. No JVD present. Carotid bruit is not present. No thyromegaly present.  Cardiovascular: Normal rate, regular rhythm, normal heart sounds and intact distal pulses.   No murmur heard. Pulmonary/Chest: Effort normal and breath sounds normal. No respiratory distress. She has no wheezes. She has no rales.  Musculoskeletal: She exhibits no edema.  Lymphadenopathy:    She has no cervical adenopathy.  Neurological: She is alert. She has normal reflexes. No cranial nerve deficit. She exhibits normal muscle tone. Coordination normal.  Skin: Skin is warm and dry. No rash noted. No erythema. No pallor.  Psychiatric: She has a normal mood and affect.  Not overly anxious           Assessment & Plan:   Problem List Items Addressed This Visit     Cardiovascular and Mediastinum   HYPERTENSION - Primary      bp in fair control at this time  BP Readings from Last 1 Encounters:  01/10/14 126/90   No changes needed Disc lifstyle change with low sodium diet and exercise   Better control with toprol xl  Will continue this and lifestyle change     Relevant Medications      metoprolol succinate (TOPROL-XL) 24 hr tablet     Nervous and Auditory   ETD (eustachian tube dysfunction)     On R after tx of OM  Px flonase-use at least 2 wk or for all season Continue zyrtec  Update       Other   Stress reaction     anx sympt/palpitations are imp with toprol xl  Will proceed with counseling ref     Palpitations     Improved with metoprolol ER Continue this  udpate if sluggishness does not imp

## 2014-01-10 NOTE — Assessment & Plan Note (Signed)
bp in fair control at this time  BP Readings from Last 1 Encounters:  01/10/14 126/90   No changes needed Disc lifstyle change with low sodium diet and exercise   Better control with toprol xl  Will continue this and lifestyle change

## 2014-01-10 NOTE — Progress Notes (Signed)
Pre visit review using our clinic review tool, if applicable. No additional management support is needed unless otherwise documented below in the visit note. 

## 2014-01-10 NOTE — Assessment & Plan Note (Signed)
anx sympt/palpitations are imp with toprol xl  Will proceed with counseling ref

## 2014-01-10 NOTE — Assessment & Plan Note (Signed)
On R after tx of OM  Px flonase-use at least 2 wk or for all season Continue zyrtec  Update

## 2014-01-10 NOTE — Assessment & Plan Note (Signed)
Improved with metoprolol ER Continue this  udpate if sluggishness does not imp

## 2014-01-10 NOTE — Patient Instructions (Signed)
I'm glad you are doing better  Continue current medicines See Rosaria Ferries on the way out for referral  Use Flonase for at least 2 weeks to open up ear Update if not starting to improve in a week or if worsening

## 2014-01-30 ENCOUNTER — Ambulatory Visit (INDEPENDENT_AMBULATORY_CARE_PROVIDER_SITE_OTHER): Payer: 59 | Admitting: Psychology

## 2014-01-30 DIAGNOSIS — F411 Generalized anxiety disorder: Secondary | ICD-10-CM

## 2014-02-20 ENCOUNTER — Ambulatory Visit (INDEPENDENT_AMBULATORY_CARE_PROVIDER_SITE_OTHER): Payer: 59 | Admitting: Psychology

## 2014-02-20 DIAGNOSIS — F411 Generalized anxiety disorder: Secondary | ICD-10-CM

## 2014-03-20 ENCOUNTER — Ambulatory Visit (INDEPENDENT_AMBULATORY_CARE_PROVIDER_SITE_OTHER): Payer: 59 | Admitting: Psychology

## 2014-03-20 DIAGNOSIS — F411 Generalized anxiety disorder: Secondary | ICD-10-CM

## 2014-04-20 ENCOUNTER — Telehealth: Payer: Self-pay | Admitting: Family Medicine

## 2014-04-20 DIAGNOSIS — I10 Essential (primary) hypertension: Secondary | ICD-10-CM

## 2014-04-20 DIAGNOSIS — E7800 Pure hypercholesterolemia, unspecified: Secondary | ICD-10-CM

## 2014-04-20 NOTE — Telephone Encounter (Signed)
-----   Message from Donnelly sent at 04/14/2014  3:56 PM EST ----- Regarding: Cpx labs Mon 04/21/14, need orders. Please order  future cpx labs for pt's upcoming lab appt. Thanks Aniceto Boss

## 2014-04-21 ENCOUNTER — Encounter: Payer: Self-pay | Admitting: Radiology

## 2014-04-21 ENCOUNTER — Other Ambulatory Visit (INDEPENDENT_AMBULATORY_CARE_PROVIDER_SITE_OTHER): Payer: 59

## 2014-04-21 DIAGNOSIS — E7800 Pure hypercholesterolemia, unspecified: Secondary | ICD-10-CM

## 2014-04-21 DIAGNOSIS — I10 Essential (primary) hypertension: Secondary | ICD-10-CM

## 2014-04-21 LAB — LIPID PANEL
CHOLESTEROL: 167 mg/dL (ref 0–200)
HDL: 42.5 mg/dL (ref >39.00)
LDL Cholesterol: 95 mg/dL (ref 0–99)
NonHDL: 124.5
Total CHOL/HDL Ratio: 4
Triglycerides: 149 mg/dL (ref 0.0–149.0)
VLDL: 29.8 mg/dL (ref 0.0–40.0)

## 2014-04-21 LAB — CBC WITH DIFFERENTIAL/PLATELET
Basophils Absolute: 0.1 10*3/uL (ref 0.0–0.1)
Basophils Relative: 0.8 % (ref 0.0–3.0)
EOS PCT: 2.8 % (ref 0.0–5.0)
Eosinophils Absolute: 0.2 10*3/uL (ref 0.0–0.7)
HCT: 40.1 % (ref 36.0–46.0)
Hemoglobin: 13.3 g/dL (ref 12.0–15.0)
Lymphocytes Relative: 22.9 % (ref 12.0–46.0)
Lymphs Abs: 2 10*3/uL (ref 0.7–4.0)
MCHC: 33.3 g/dL (ref 30.0–36.0)
MCV: 96.8 fl (ref 78.0–100.0)
Monocytes Absolute: 0.7 10*3/uL (ref 0.1–1.0)
Monocytes Relative: 8.2 % (ref 3.0–12.0)
NEUTROS PCT: 65.3 % (ref 43.0–77.0)
Neutro Abs: 5.6 10*3/uL (ref 1.4–7.7)
PLATELETS: 249 10*3/uL (ref 150.0–400.0)
RBC: 4.14 Mil/uL (ref 3.87–5.11)
RDW: 13.2 % (ref 11.5–15.5)
WBC: 8.6 10*3/uL (ref 4.0–10.5)

## 2014-04-21 LAB — COMPREHENSIVE METABOLIC PANEL
ALBUMIN: 4.1 g/dL (ref 3.5–5.2)
ALT: 29 U/L (ref 0–35)
AST: 26 U/L (ref 0–37)
Alkaline Phosphatase: 73 U/L (ref 39–117)
BUN: 16 mg/dL (ref 6–23)
CALCIUM: 9.3 mg/dL (ref 8.4–10.5)
CHLORIDE: 106 meq/L (ref 96–112)
CO2: 26 meq/L (ref 19–32)
Creatinine, Ser: 1 mg/dL (ref 0.4–1.2)
GFR: 61.24 mL/min (ref >60.00)
GLUCOSE: 91 mg/dL (ref 70–99)
POTASSIUM: 3.9 meq/L (ref 3.5–5.1)
SODIUM: 140 meq/L (ref 135–145)
Total Bilirubin: 0.9 mg/dL (ref 0.2–1.2)
Total Protein: 7.2 g/dL (ref 6.0–8.3)

## 2014-04-21 LAB — TSH: TSH: 1.61 u[IU]/mL (ref 0.35–4.50)

## 2014-04-21 NOTE — Addendum Note (Signed)
Addended by: Marchia Bond on: 04/21/2014 10:39 AM   Modules accepted: Orders

## 2014-04-24 ENCOUNTER — Ambulatory Visit (INDEPENDENT_AMBULATORY_CARE_PROVIDER_SITE_OTHER): Payer: Medicare Other | Admitting: Psychology

## 2014-04-24 DIAGNOSIS — F411 Generalized anxiety disorder: Secondary | ICD-10-CM

## 2014-04-25 ENCOUNTER — Ambulatory Visit (INDEPENDENT_AMBULATORY_CARE_PROVIDER_SITE_OTHER): Payer: Medicare Other | Admitting: Family Medicine

## 2014-04-25 ENCOUNTER — Encounter: Payer: Self-pay | Admitting: Family Medicine

## 2014-04-25 VITALS — BP 145/90 | HR 76 | Temp 98.4°F | Ht 60.75 in | Wt 135.0 lb

## 2014-04-25 DIAGNOSIS — E2839 Other primary ovarian failure: Secondary | ICD-10-CM

## 2014-04-25 DIAGNOSIS — Z0000 Encounter for general adult medical examination without abnormal findings: Secondary | ICD-10-CM

## 2014-04-25 DIAGNOSIS — I10 Essential (primary) hypertension: Secondary | ICD-10-CM

## 2014-04-25 DIAGNOSIS — E7800 Pure hypercholesterolemia, unspecified: Secondary | ICD-10-CM

## 2014-04-25 DIAGNOSIS — Z23 Encounter for immunization: Secondary | ICD-10-CM

## 2014-04-25 MED ORDER — ATORVASTATIN CALCIUM 10 MG PO TABS: 10.0000 mg | ORAL_TABLET | Freq: Every day | ORAL | Status: DC

## 2014-04-25 MED ORDER — METOPROLOL SUCCINATE ER 50 MG PO TB24: 50.0000 mg | ORAL_TABLET | Freq: Every day | ORAL | Status: DC

## 2014-04-25 NOTE — Progress Notes (Signed)
Pre visit review using our clinic review tool, if applicable. No additional management support is needed unless otherwise documented below in the visit note. 

## 2014-04-25 NOTE — Assessment & Plan Note (Signed)
Reviewed health habits including diet and exercise and skin cancer prevention Reviewed appropriate screening tests for age  Also reviewed health mt list, fam hx and immunization status , as well as social and family history   See HPI Labs reviewed  She will schedule own mammogram  Pneumovax today dexa ordered

## 2014-04-25 NOTE — Assessment & Plan Note (Signed)
Disc goals for lipids and reasons to control them Rev labs with pt Rev low sat fat diet in detail Refilled lipitor

## 2014-04-25 NOTE — Assessment & Plan Note (Signed)
Ref for dexa  Disc need for calcium/ vitamin D/ wt bearing exercise and bone density test every 2 y to monitor Disc safety/ fracture risk in detail   

## 2014-04-25 NOTE — Assessment & Plan Note (Signed)
bp is up today Disc diet/exercise  Will inc exercise and keep trying to loose wt  DASH diet info given  F/u 3 mo  Adj tx if needed

## 2014-04-25 NOTE — Progress Notes (Signed)
Subjective:    Patient ID: Jamie Dillon, female    DOB: 1951-03-02, 63 y.o.   MRN: [id number]  HPI Here for annual medicare wellness visit and also for acute and chronic medical problems  Wt is stable with bmi of 25  I have personally reviewed the Medicare Annual Wellness questionnaire and have noted 1. The patient's medical and social history 2. Their use of alcohol, tobacco or illicit drugs 3. Their current medications and supplements 4. The patient's functional ability including ADL's, fall risks, home safety risks and hearing or visual             impairment. 5. Diet and physical activities 6. Evidence for depression or mood disorders  The patients weight, height, BMI have been recorded in the chart and visual acuity is per eye clinic.  I have made referrals, counseling and provided education to the patient based review of the above and I have provided the pt with a written personalized care plan for preventive services.  Doing pretty well overall    See scanned forms.  Routine anticipatory guidance given to patient.  See health maintenance. Colon cancer screening [facility] 11/10 - 10 year recall  Breast cancer screening 12/14 nl normal - has gone to [facility] in the past -needs to schedule  Self breast exam no lumps or changes  Flu vaccine 10/15 Tetanus vaccine 10/12 Pneumovax 9/09- due for that today Zoster vaccine 1/14   Advance directive= has a living will and power of attorney  Cognitive function addressed- see scanned forms- and if abnormal then additional documentation follows.  Notices small changes only (husband counts on her to remember things)   PMH and SH reviewed  Meds, vitals, and allergies reviewed.   ROS: See HPI.  Otherwise negative.     bp is up today - took her med this am  No cp or palpitations or headaches or edema  No side effects to medicines  BP Readings from Last 3 Encounters:  04/25/14 146/96  01/10/14 126/90  01/03/14 160/106       Hyperlipidemia On lipitor and diet  Lab Results  Component Value Date   CHOL 167 04/21/2014   CHOL 185 04/22/2013   CHOL 176 04/20/2012   Lab Results  Component Value Date   HDL 42.50 04/21/2014   HDL 42.40 04/22/2013   HDL 48.50 04/20/2012   Lab Results  Component Value Date   LDLCALC 95 04/21/2014   LDLCALC 116* 04/22/2013   LDLCALC 97 04/20/2012   Lab Results  Component Value Date   TRIG 149.0 04/21/2014   TRIG 131.0 04/22/2013   TRIG 153.0* 04/20/2012   Lab Results  Component Value Date   CHOLHDL 4 04/21/2014   CHOLHDL 4 04/22/2013   CHOLHDL 4 04/20/2012   Lab Results  Component Value Date   LDLDIRECT 127.3 03/01/2011    In good control     Chemistry      Component Value Date/Time   NA 140 04/21/2014 1039   K 3.9 04/21/2014 1039   CL 106 04/21/2014 1039   CO2 26 04/21/2014 1039   BUN 16 04/21/2014 1039   CREATININE 1.0 04/21/2014 1039      Component Value Date/Time   CALCIUM 9.3 04/21/2014 1039   ALKPHOS 73 04/21/2014 1039   AST 26 04/21/2014 1039   ALT 29 04/21/2014 1039   BILITOT 0.9 04/21/2014 1039      Lab Results  Component Value Date   WBC 8.6 04/21/2014   HGB 13.3 04/21/2014  HCT 40.1 04/21/2014   MCV 96.8 04/21/2014   PLT 249.0 04/21/2014    Lab Results  Component Value Date   TSH 1.61 04/21/2014     Has had a bone density test - ? 3-4 years ago  No hx of bone loss  Is open to re checking one She takes ca and D No fractures    Patient Active Problem List   Diagnosis Date Noted  . Encounter for Medicare annual wellness exam 04/25/2014  . ETD (eustachian tube dysfunction) 01/10/2014  . Stress reaction 01/03/2014  . Palpitations 01/03/2014  . Gynecological examination 03/04/2011  . Routine general medical examination at a health care facility 02/26/2011  . HYPERCHOLESTEROLEMIA, PURE 02/01/2007  . Essential hypertension 09/08/2006  . ALLERGIC RHINITIS 09/08/2006  . TOBACCO ABUSE, HX OF 09/08/2006   Past Medical  History  Diagnosis Date  . Allergic rhinitis   . HTN (hypertension)   . HLD (hyperlipidemia)   . Tobacco abuse   . Family history of malignant neoplasm of breast   . Family history of other kidney diseases   . Disorder of bone and cartilage, unspecified    Past Surgical History  Procedure Laterality Date  . Total vaginal hysterectomy      endometriosis   History  Substance Use Topics  . Smoking status: Former Research scientist (life sciences)  . Smokeless tobacco: Not on file  . Alcohol Use: 0.0 oz/week    0 Not specified per week     Comment: occasional wine   Family History  Problem Relation Age of Onset  . Breast cancer Sister   . Hypertension Mother   . Kidney disease Father   . Diabetes Paternal Aunt    No Known Allergies Current Outpatient Prescriptions on File Prior to Visit  Medication Sig Dispense Refill  . ALPRAZolam (XANAX) 0.5 MG tablet Take 1 tablet (0.5 mg total) by mouth daily as needed for anxiety. 30 tablet 1  . aspirin 81 MG tablet Take 81 mg by mouth daily.      Marland Kitchen atorvastatin (LIPITOR) 10 MG tablet Take 1 tablet (10 mg total) by mouth daily. 30 tablet 11  . b complex vitamins tablet Take 1 tablet by mouth daily.     . Calcium Carbonate-Vitamin D (CALCIUM-VITAMIN D) 500-200 MG-UNIT per tablet Take 3 tablets by mouth every morning.      . cetirizine (ZYRTEC) 10 MG tablet Take 10 mg by mouth daily as needed.     . diphenhydrAMINE (BENADRYL) 25 MG tablet Take 25 mg by mouth daily as needed.      . fluticasone (FLONASE) 50 MCG/ACT nasal spray Place 2 sprays into both nostrils daily. (Patient taking differently: Place 2 sprays into both nostrils daily as needed. ) 16 g 6  . metoprolol succinate (TOPROL-XL) 50 MG 24 hr tablet Take 1 tablet (50 mg total) by mouth daily. Take with or immediately following a meal. 30 tablet 11  . Multiple Vitamin (MULTIVITAMIN) tablet Take 1 tablet by mouth daily.       No current facility-administered medications on file prior to visit.      Review of  Systems Review of Systems  Constitutional: Negative for fever, appetite change, fatigue and unexpected weight change.  Eyes: Negative for pain and visual disturbance.  Respiratory: Negative for cough and shortness of breath.   Cardiovascular: Negative for cp or palpitations    Gastrointestinal: Negative for nausea, diarrhea and constipation.  Genitourinary: Negative for urgency and frequency.  Skin: Negative for pallor or rash  Neurological: Negative for weakness, light-headedness, numbness and headaches.  Hematological: Negative for adenopathy. Does not bruise/bleed easily.  Psychiatric/Behavioral: Negative for dysphoric mood. The patient is not nervous/anxious.         Objective:   Physical Exam  Constitutional: She appears well-developed and well-nourished. No distress.  HENT:  Head: Normocephalic and atraumatic.  Right Ear: External ear normal.  Left Ear: External ear normal.  Mouth/Throat: Oropharynx is clear and moist.  Eyes: Conjunctivae and EOM are normal. Pupils are equal, round, and reactive to light. No scleral icterus.  Neck: Normal range of motion. Neck supple. No JVD present. Carotid bruit is not present. No thyromegaly present.  Cardiovascular: Normal rate, regular rhythm, normal heart sounds and intact distal pulses.  Exam reveals no gallop.   Pulmonary/Chest: Effort normal and breath sounds normal. No respiratory distress. She has no wheezes. She exhibits no tenderness.  Abdominal: Soft. Bowel sounds are normal. She exhibits no distension, no abdominal bruit and no mass. There is no tenderness.  Genitourinary: No breast swelling, tenderness, discharge or bleeding.  Breast exam: No mass, nodules, thickening, tenderness, bulging, retraction, inflamation, nipple discharge or skin changes noted.  No axillary or clavicular LA.      Musculoskeletal: Normal range of motion. She exhibits no edema or tenderness.  No kyphosis   Lymphadenopathy:    She has no cervical  adenopathy.  Neurological: She is alert. She has normal reflexes. No cranial nerve deficit. She exhibits normal muscle tone. Coordination normal.  Skin: Skin is warm and dry. No rash noted. No erythema. No pallor.  Psychiatric: She has a normal mood and affect.          Assessment & Plan:

## 2014-04-25 NOTE — Patient Instructions (Signed)
Try to get regular exercise  Avoid processed foods and salt as much as you can  Follow up in 3 months for blood pressure re check  Pneumonia vaccine today  Stop at check out for referral -bone density test

## 2014-04-28 ENCOUNTER — Other Ambulatory Visit: Payer: Self-pay | Admitting: Family Medicine

## 2014-04-28 ENCOUNTER — Telehealth: Payer: Self-pay | Admitting: Family Medicine

## 2014-04-28 NOTE — Telephone Encounter (Signed)
emmi emailed °

## 2014-05-02 ENCOUNTER — Encounter: Payer: Self-pay | Admitting: Family Medicine

## 2014-05-16 DIAGNOSIS — Z923 Personal history of irradiation: Secondary | ICD-10-CM

## 2014-05-16 DIAGNOSIS — Z9221 Personal history of antineoplastic chemotherapy: Secondary | ICD-10-CM

## 2014-05-16 HISTORY — DX: Personal history of irradiation: Z92.3

## 2014-05-16 HISTORY — DX: Personal history of antineoplastic chemotherapy: Z92.21

## 2014-05-22 ENCOUNTER — Ambulatory Visit (INDEPENDENT_AMBULATORY_CARE_PROVIDER_SITE_OTHER)
Admission: RE | Admit: 2014-05-22 | Discharge: 2014-05-22 | Disposition: A | Discharge status: Home / Self Care | Payer: Self-pay | Source: Ambulatory Visit | Attending: Family Medicine | Admitting: Family Medicine

## 2014-05-22 DIAGNOSIS — E2839 Other primary ovarian failure: Secondary | ICD-10-CM

## 2014-05-25 LAB — HM DEXA SCAN

## 2014-05-28 ENCOUNTER — Encounter: Payer: Self-pay | Admitting: *Deleted

## 2014-05-28 ENCOUNTER — Encounter: Payer: Self-pay | Admitting: Family Medicine

## 2014-06-09 ENCOUNTER — Encounter: Payer: Self-pay | Admitting: Family Medicine

## 2014-06-12 ENCOUNTER — Encounter: Payer: Self-pay | Admitting: Family Medicine

## 2014-06-17 ENCOUNTER — Other Ambulatory Visit: Payer: Self-pay

## 2014-06-19 ENCOUNTER — Encounter: Payer: Self-pay | Admitting: Family Medicine

## 2014-06-19 ENCOUNTER — Telehealth: Payer: Self-pay | Admitting: *Deleted

## 2014-06-19 ENCOUNTER — Encounter: Payer: Self-pay | Admitting: *Deleted

## 2014-06-19 DIAGNOSIS — C50511 Malignant neoplasm of lower-outer quadrant of right female breast: Secondary | ICD-10-CM

## 2014-06-19 DIAGNOSIS — Z170 Estrogen receptor positive status [ER+]: Secondary | ICD-10-CM

## 2014-06-19 DIAGNOSIS — Z853 Personal history of malignant neoplasm of breast: Secondary | ICD-10-CM | POA: Insufficient documentation

## 2014-06-19 HISTORY — DX: Malignant neoplasm of lower-outer quadrant of right female breast: C50.511

## 2014-06-19 NOTE — Telephone Encounter (Signed)
Confirmed [facility] for 06/25/14 at 1200.  Instructions and contact information given.

## 2014-06-24 NOTE — Progress Notes (Addendum)
Radiation Oncology         [phone] ________________________________  Initial outpatient Consultation  Name: Jamie Dillon MRN: [id number]  Date: 06/25/2014  DOB: 1950-09-29  CC:Jamie Pardon, MD  Rolm Bookbinder, MD   REFERRING PHYSICIAN: Rolm Bookbinder, MD  DIAGNOSIS:    ICD-9-CM ICD-10-CM   1. 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 female who presented with a 46m mass on screening mammography.  It was 791mon [place] showed Invasive Ductal Carcinoma, ER99% / PR7% / Her2neg, Grade II.  She has been on hormonal therapy in the past, but not for the past 7 years.  She is in her USOH.  Her sister had breast cancer at age 7184No other breast or ovarian cancers in her family. She is here with her daughter and husband.    PATH: IMMUNOHISTOCHEMICAL AND MORPHOMETRIC ANALYSIS BY THE AUTOMATED CELLULAR IMAGING SYSTEM (ACIS) Estrogen Receptor: 98%, POSITIVE, STRONG STAINING INTENSITY Progesterone Receptor: 7%, POSITIVE, MODERATE STAINING INTENSITY Proliferation Marker Ki67: 22% REFERENCE RANGE ESTROGEN RECEPTOR NEGATIVE <1% POSITIVE =>1% PROGESTERONE RECEPTOR NEGATIVE <1% POSITIVE =>1% All controls stained appropriately JOClaudette LawsD Pathologist, Electronic Signature ( Signed 06/24/2014) CHROMOGENIC IN-SITU HYBRIDIZATION Results: HER-2/NEU BY CISH - NEGATIVE.  FINAL DIAGNOSIS Diagnosis Breast, right, needle core biopsy - INVASIVE DUCTAL CARCINOMA, SEE COMMENT.   Microscopic Comment Although the grade of tumor is best assessed at resection, with these biopsies, the invasive tumor is grade II. Breast prognostic studies are pending and will be reported in an addendum. The case is reviewed with Dr. KiLyndon Codeho concurs.   PAST MEDICAL HISTORY:  has a past medical history of Allergic  rhinitis; HTN (hypertension); HLD (hyperlipidemia); Tobacco abuse; Family history of malignant neoplasm of breast; Family history of other kidney diseases; Disorder of bone and cartilage, unspecified; Breast cancer of lower-outer quadrant of right female breast (06/19/2014); and Anxiety.    PAST SURGICAL HISTORY: Past Surgical History  Procedure Laterality Date  . Total vaginal hysterectomy      endometriosis  . Abdominal hysterectomy    . Appendectomy      FAMILY HISTORY: family history includes Breast cancer in her sister; Diabetes in her paternal aunt; Hypertension in her mother; Kidney disease in her father.  SOCIAL HISTORY:  reports that she quit smoking about 4 years ago. Her smoking use included Cigarettes. She smoked 1.00 pack per day. She does not have any smokeless tobacco history on file. She reports that she drinks alcohol. She reports that she does not use illicit drugs.  ALLERGIES: Review of patient's allergies indicates no known allergies.  MEDICATIONS:  Current Outpatient Prescriptions  Medication Sig Dispense Refill  . ALPRAZolam (XANAX) 0.5 MG tablet Take 1 tablet (0.5 mg total) by mouth daily as needed for anxiety. (Patient not taking: Reported on 06/25/2014) 30 tablet 1  . aspirin 81 MG tablet Take 81 mg by mouth daily.      . Marland Kitchentorvastatin (LIPITOR) 10 MG tablet Take 1 tablet (10 mg total) by mouth daily. 30 tablet 11  . b complex vitamins tablet Take 1 tablet by mouth daily.     . Calcium Carbonate-Vitamin D (CALCIUM-VITAMIN D) 500-200 MG-UNIT per tablet Take 3 tablets by mouth every morning.      . cetirizine (ZYRTEC) 10 MG tablet Take 10 mg by mouth daily as needed.     .Marland Kitchen  diphenhydrAMINE (BENADRYL) 25 MG tablet Take 25 mg by mouth daily as needed.      . fluticasone (FLONASE) 50 MCG/ACT nasal spray Place 2 sprays into both nostrils daily. (Patient not taking: Reported on 06/25/2014) 16 g 6  . metoprolol succinate (TOPROL-XL) 50 MG 24 hr tablet Take 1 tablet (50 mg  total) by mouth daily. Take with or immediately following a meal. 30 tablet 11  . Multiple Vitamin (MULTIVITAMIN) tablet Take 1 tablet by mouth daily.       No current facility-administered medications for this encounter.    REVIEW OF SYSTEMS:  Notable for that above.   PHYSICAL EXAM:   Vitals with Age-Percentiles 06/25/2014  Length 563.8 cm  Systolic 937  Diastolic 92  Pulse 93  Respiration 19  Weight 60.873 kg  BMI 25.6  VISIT REPORT    General: Alert and oriented, in no acute distress HEENT: Head is normocephalic. Extraocular movements are intact.  Neck: Neck is supple, no palpable cervical or supraclavicular lymphadenopathy. Heart: Regular in rate and rhythm with no murmurs, rubs, or gallops. Chest: Clear to auscultation bilaterally, with no rhonchi, wheezes, or rales. Abdomen: Soft, nontender, nondistended, with no rigidity or guarding. Extremities: No cyanosis or edema. Lymphatics: see Neck Exam Skin: No concerning lesions. Musculoskeletal: symmetric strength and muscle tone throughout. Neurologic: Cranial nerves II through XII are grossly intact. No obvious focalities. Speech is fluent. Coordination is intact. Psychiatric: Judgment and insight are intact. Affect is appropriate. Breasts: post biopsy ecchymosis in LOQ of right breast. Otherwise, breasts appear normal, no suspicious masses, no skin or nipple changes or axillary nodes.  ECOG = 0  0 - Asymptomatic (Fully active, able to carry on all predisease activities without restriction)  1 - Symptomatic but completely ambulatory (Restricted in physically strenuous activity but ambulatory and able to carry out work of a light or sedentary nature. For example, light housework, office work)  2 - Symptomatic, <50% in bed during the day (Ambulatory and capable of all self care but unable to carry out any work activities. Up and about more than 50% of waking hours)  3 - Symptomatic, >50% in bed, but not bedbound (Capable of  only limited self-care, confined to bed or chair 50% or more of waking hours)  4 - Bedbound (Completely disabled. Cannot carry on any self-care. Totally confined to bed or chair)  5 - Death   Eustace Pen MM, Creech RH, Tormey DC, et al. [phone]). "Toxicity and response criteria of the [facility]". [place] Oncol. 5 (6): 649-55   LABORATORY DATA:  Lab Results  Component Value Date   WBC 7.8 06/25/2014   HGB 14.4 06/25/2014   HCT 43.4 06/25/2014   MCV 94.8 06/25/2014   PLT 269 06/25/2014   CMP     Component Value Date/Time   NA 142 06/25/2014 1206   NA 140 04/21/2014 1039   K 4.2 06/25/2014 1206   K 3.9 04/21/2014 1039   CL 106 04/21/2014 1039   CO2 26 06/25/2014 1206   CO2 26 04/21/2014 1039   GLUCOSE 111 06/25/2014 1206   GLUCOSE 91 04/21/2014 1039   BUN 9.8 06/25/2014 1206   BUN 16 04/21/2014 1039   CREATININE 0.9 06/25/2014 1206   CREATININE 1.0 04/21/2014 1039   CALCIUM 9.8 06/25/2014 1206   CALCIUM 9.3 04/21/2014 1039   PROT 7.7 06/25/2014 1206   PROT 7.2 04/21/2014 1039   ALBUMIN 4.2 06/25/2014 1206   ALBUMIN 4.1 04/21/2014 1039   AST  20 06/25/2014 1206   AST 26 04/21/2014 1039   ALT 22 06/25/2014 1206   ALT 29 04/21/2014 1039   ALKPHOS 90 06/25/2014 1206   ALKPHOS 73 04/21/2014 1039   BILITOT 0.37 06/25/2014 1206   BILITOT 0.9 04/21/2014 1039   GFRNONAA 76.42 02/23/2010 1053   GFRAA 83 02/13/2008 1027         RADIOGRAPHY: No results found. (see above)    IMPRESSION/PLAN: She has been discussed at our multidisciplinary tumor board.  The consensus is that she be a good candidate for breast conservation. I talked to her about the option of a mastectomy and informed her that her expected overall survival would be equivalent between mastectomy and breast conservation, based upon randomized controlled data. She is enthusiastic about breast conservation.  Plan is for lumpectomy and SLN biopsy, oncotype testing, chemotherapy if oncotype  results favor this, and then adjuvant radiotherapy.  Ultimately, she will receive anti-estrogen therapy.  It was a pleasure meeting the patient today. We discussed the risks, benefits, and side effects of radiotherapy. We discussed that radiation would take approximately 4-6 weeks to complete and that I would give the patient a few weeks to heal following surgery before starting treatment planning. If she received chemotherapy, this would precede radiotherapy. We spoke about acute effects including skin irritation and fatigue as well as much less common late effects including lung irritation. We spoke about the latest technology that is used to minimize the risk of late effects for breast cancer patients undergoing radiotherapy. No guarantees of treatment were given. The patient is enthusiastic about proceeding with treatment. I look forward to participating in the patient's care.  __________________________________________   Eppie Gibson, MD

## 2014-06-25 ENCOUNTER — Ambulatory Visit: Payer: Medicare Other

## 2014-06-25 ENCOUNTER — Ambulatory Visit: Payer: Medicare Other | Attending: General Surgery | Admitting: Physical Therapy

## 2014-06-25 ENCOUNTER — Encounter: Payer: Self-pay | Admitting: Oncology

## 2014-06-25 ENCOUNTER — Ambulatory Visit
Admission: RE | Admit: 2014-06-25 | Discharge: 2014-06-25 | Disposition: A | Discharge status: Home / Self Care | Payer: Medicare Other | Source: Ambulatory Visit | Attending: Radiation Oncology | Admitting: Radiation Oncology

## 2014-06-25 ENCOUNTER — Other Ambulatory Visit (INDEPENDENT_AMBULATORY_CARE_PROVIDER_SITE_OTHER): Payer: Self-pay | Admitting: General Surgery

## 2014-06-25 ENCOUNTER — Other Ambulatory Visit (HOSPITAL_BASED_OUTPATIENT_CLINIC_OR_DEPARTMENT_OTHER): Payer: Medicare Other

## 2014-06-25 ENCOUNTER — Ambulatory Visit (HOSPITAL_BASED_OUTPATIENT_CLINIC_OR_DEPARTMENT_OTHER): Payer: 59 | Admitting: Oncology

## 2014-06-25 ENCOUNTER — Encounter: Payer: Self-pay | Admitting: Physical Therapy

## 2014-06-25 VITALS — BP 178/92 | HR 93 | Temp 98.4°F | Resp 19 | Ht 60.75 in | Wt 134.2 lb

## 2014-06-25 DIAGNOSIS — C50911 Malignant neoplasm of unspecified site of right female breast: Secondary | ICD-10-CM | POA: Insufficient documentation

## 2014-06-25 DIAGNOSIS — C50511 Malignant neoplasm of lower-outer quadrant of right female breast: Secondary | ICD-10-CM

## 2014-06-25 DIAGNOSIS — Z170 Estrogen receptor positive status [ER+]: Secondary | ICD-10-CM

## 2014-06-25 DIAGNOSIS — R293 Abnormal posture: Secondary | ICD-10-CM | POA: Insufficient documentation

## 2014-06-25 DIAGNOSIS — I10 Essential (primary) hypertension: Secondary | ICD-10-CM

## 2014-06-25 DIAGNOSIS — Z87891 Personal history of nicotine dependence: Secondary | ICD-10-CM

## 2014-06-25 LAB — CBC WITH DIFFERENTIAL/PLATELET
BASO%: 1.1 % (ref 0.0–2.0)
Basophils Absolute: 0.1 10*3/uL (ref 0.0–0.1)
EOS ABS: 0.1 10*3/uL (ref 0.0–0.5)
EOS%: 1.1 % (ref 0.0–7.0)
HCT: 43.4 % (ref 34.8–46.6)
HGB: 14.4 g/dL (ref 11.6–15.9)
LYMPH%: 21.3 % (ref 14.0–49.7)
MCH: 31.5 pg (ref 25.1–34.0)
MCHC: 33.2 g/dL (ref 31.5–36.0)
MCV: 94.8 fL (ref 79.5–101.0)
MONO#: 0.5 10*3/uL (ref 0.1–0.9)
MONO%: 7 % (ref 0.0–14.0)
NEUT%: 69.5 % (ref 38.4–76.8)
NEUTROS ABS: 5.4 10*3/uL (ref 1.5–6.5)
Platelets: 269 10*3/uL (ref 145–400)
RBC: 4.58 10*6/uL (ref 3.70–5.45)
RDW: 12.6 % (ref 11.2–14.5)
WBC: 7.8 10*3/uL (ref 3.9–10.3)
lymph#: 1.7 10*3/uL (ref 0.9–3.3)

## 2014-06-25 LAB — COMPREHENSIVE METABOLIC PANEL (CC13)
ALBUMIN: 4.2 g/dL (ref 3.5–5.0)
ALT: 22 U/L (ref 0–55)
ANION GAP: 8 meq/L (ref 3–11)
AST: 20 U/L (ref 5–34)
Alkaline Phosphatase: 90 U/L (ref 40–150)
BUN: 9.8 mg/dL (ref 7.0–26.0)
CHLORIDE: 108 meq/L (ref 98–109)
CO2: 26 meq/L (ref 22–29)
Calcium: 9.8 mg/dL (ref 8.4–10.4)
Creatinine: 0.9 mg/dL (ref 0.6–1.1)
EGFR: 64 mL/min/{1.73_m2} — AB (ref >90)
GLUCOSE: 111 mg/dL (ref 70–140)
POTASSIUM: 4.2 meq/L (ref 3.5–5.1)
SODIUM: 142 meq/L (ref 136–145)
Total Bilirubin: 0.37 mg/dL (ref 0.20–1.20)
Total Protein: 7.7 g/dL (ref 6.4–8.3)

## 2014-06-25 NOTE — Patient Instructions (Signed)

## 2014-06-25 NOTE — Progress Notes (Signed)
Ms. Kosch is a very pleasant 64 y.o. female from [place], [place] with newly diagnosed grade 2 invasive ductal carcinoma of the right breast.  Biopsy results revealed the tumor's hormone status as ER positive, PR positive, and HER2/neu negative. Ki67 is 22%.  She presents today with her husband and daughters to the [facility]) for treatment consideration and recommendations from the breast surgeon, radiation oncologist, and medical oncologist.     I briefly met with Ms. Bai and her family during her [facility] visit today. We discussed the purpose of the Survivorship Clinic, which will include monitoring for recurrence, coordinating completion of age and gender-appropriate cancer screenings, promotion of overall wellness, as well as managing potential late/long-term side effects of anti-cancer treatments.    As of today, the treatment plan for Ms. Olmo will likely include surgery and radiation therapy.  Anti-estrogen treatment will be considered for her as well.The intent of treatment for Ms. Skiff is cure, therefore she will be eligible for the Survivorship Clinic upon her completion of treatment.  Her survivorship care plan (SCP) document will be drafted and updated throughout the course of her treatment trajectory. She will receive the SCP in an office visit with myself in the Survivorship Clinic once she has completed treatment.   Ms. Augusta was encouraged to ask questions and all questions were answered to her satisfaction.  She was given my business card and encouraged to contact me with any concerns regarding survivorship.  I look forward to participating in her care.   Mike Craze, NP [place] [phone]

## 2014-06-25 NOTE — Progress Notes (Signed)
[facility]  [phone] Fax:([phone]) [phone]     ID: Jamie Dillon DOB: 22-Aug-1950  MR#: [id number]  OFB#:[id number]  Patient Care Team: Jamie Greenspan, MD as PCP - General Jamie Bookbinder, MD as Consulting Physician (General Surgery) Jamie Cruel, MD as Consulting Physician (Oncology) Jamie Gibson, MD as Attending Physician (Radiation Oncology) Jamie Culver, RN as Registered Nurse Jamie Point, RN as Registered Nurse OTHER MD:  CHIEF COMPLAINT:  Estrogen receptor positive breast cancer  CURRENT TREATMENT: Awaiting definitive surgery   BREAST CANCER HISTORY: Jamie Dillon had routine screening mammography at [facility] 06/05/2014. The breast density was category B. There was a 6 mm irregular mass in the right breast. On 06/11/2014 the patient underwent right breast ultrasonography at [facility]. This confirmed a 7 mm all her than wide irregular mass in the right blast at the 8:00 position. 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  The patient's subsequent history is as detailed below  INTERVAL HISTORY: Jamie Dillon was evaluated in the multidisciplinary breast cancer clinic to 03/05/2015 accompanied by her husband Jamie Dillon and her daughter Jamie Dillon  REVIEW OF SYSTEMS: There were no specific symptoms leading to the original mammogram, which was routinely scheduled. The patient denies unusual headaches, visual changes, nausea, vomiting, stiff neck, dizziness, or gait imbalance. There has been no cough, phlegm production, or pleurisy, no chest pain or pressure, and no change in bowel or bladder habits. The patient denies fever, rash, bleeding, unexplained fatigue or unexplained weight  loss. The patient does have some seasonal sinus symptoms with mild epistaxis. She admits to some anxiety, but not depression. She has mild insomnia. A detailed review of systems was otherwise entirely negative.  PAST MEDICAL HISTORY: Past Medical History  Diagnosis Date  . Allergic rhinitis   . HTN (hypertension)   . HLD (hyperlipidemia)   . Tobacco abuse   . Family history of malignant neoplasm of breast   . Family history of other kidney diseases   . Disorder of bone and cartilage, unspecified   . Breast cancer of lower-outer quadrant of right female breast 06/19/2014  . Anxiety     PAST SURGICAL HISTORY: Past Surgical History  Procedure Laterality Date  . Total vaginal hysterectomy      endometriosis  . Abdominal hysterectomy    . Appendectomy      FAMILY HISTORY Family History  Problem Relation Age of Onset  . Breast cancer Sister   . Hypertension Mother   . Kidney disease Father   . Diabetes Paternal Aunt    the patient's father died from kidney failure at the age of 30. The patient's mother is still living at age 15. The patient had no brothers, one sister. That sister was diagnosed with breast cancer at age 30. The patient's father's mother was diagnosed with mouth cancer at the age of 49.  GYNECOLOGIC HISTORY:  No LMP recorded. Patient has had a hysterectomy. Menarche age 83, first live birth age 10. The patient is GX P2. She had a total abdominal hysterectomy with bilateral salpingo-oophorectomy in 1996. She took hormone replacement for approximately 10 years, until 2009. She also took oral contraceptives for approximately 7 years remotely, with no complications  SOCIAL HISTORY:  The patient is currently retired, though she  still works part-time at the [employer] Her husband "Jamie Dillon" [name] is retired from Press photographer. Son Jamie Dillon teaches English at [employer] in [place]. Daughter Jamie Dillon lives in [place] .  She works as a Marine scientist at the [employer]    [place]: In place   HEALTH MAINTENANCE: History  Substance Use Topics  . Smoking status: Former Smoker -- 1.00 packs/day    Types: Cigarettes    Quit date: 06/25/2010  . Smokeless tobacco: Not on file  . Alcohol Use: 0.0 oz/week    0 Standard drinks or equivalent per week     Comment: occasional wine     Colonoscopy: 2010  PAP:  Bone density: December 2015  Lipid panel:  No Known Allergies  Current Outpatient Prescriptions  Medication Sig Dispense Refill  . aspirin 81 MG tablet Take 81 mg by mouth daily.      Marland Kitchen atorvastatin (LIPITOR) 10 MG tablet Take 1 tablet (10 mg total) by mouth daily. 30 tablet 11  . b complex vitamins tablet Take 1 tablet by mouth daily.     . Calcium Carbonate-Vitamin D (CALCIUM-VITAMIN D) 500-200 MG-UNIT per tablet Take 3 tablets by mouth every morning.      . metoprolol succinate (TOPROL-XL) 50 MG 24 hr tablet Take 1 tablet (50 mg total) by mouth daily. Take with or immediately following a meal. 30 tablet 11  . Multiple Vitamin (MULTIVITAMIN) tablet Take 1 tablet by mouth daily.      Marland Kitchen ALPRAZolam (XANAX) 0.5 MG tablet Take 1 tablet (0.5 mg total) by mouth daily as needed for anxiety. (Patient not taking: Reported on 06/25/2014) 30 tablet 1  . cetirizine (ZYRTEC) 10 MG tablet Take 10 mg by mouth daily as needed.     . diphenhydrAMINE (BENADRYL) 25 MG tablet Take 25 mg by mouth daily as needed.      . fluticasone (FLONASE) 50 MCG/ACT nasal spray Place 2 sprays into both nostrils daily. (Patient not taking: Reported on 06/25/2014) 16 g 6   No current facility-administered medications for this visit.    OBJECTIVE: Middle-aged white woman in no acute distress Filed Vitals:   06/25/14 1222  BP: 178/92  Pulse: 93  Temp: 98.4 F (36.9 C)  Resp: 19     Body mass index is 25.57 kg/(m^2).    ECOG FS:0 - Asymptomatic  Ocular: Sclerae unicteric, pupils equal, round and reactive to  light Ear-nose-throat: Oropharynx clear and moist Lymphatic: No cervical or supraclavicular adenopathy Lungs no rales or rhonchi, good excursion bilaterally Heart regular rate and rhythm, no murmur appreciated Abd soft, nontender, positive bowel sounds MSK no focal spinal tenderness, no joint edema Neuro: non-focal, well-oriented, appropriate affect Breasts: The right breast is status post recent biopsy. I do not palpate a well-defined mass. There are no skin or nipple changes of concern. The right axilla is benign per the left breast is unremarkable.   LAB RESULTS:  CMP     Component Value Date/Time   NA 142 06/25/2014 1206   NA 140 04/21/2014 1039   K 4.2 06/25/2014 1206   K 3.9 04/21/2014 1039   CL 106 04/21/2014 1039   CO2 26 06/25/2014 1206   CO2 26 04/21/2014 1039   GLUCOSE 111 06/25/2014 1206   GLUCOSE 91 04/21/2014 1039   BUN 9.8 06/25/2014 1206   BUN 16 04/21/2014 1039   CREATININE 0.9 06/25/2014 1206   CREATININE 1.0 04/21/2014 1039   CALCIUM 9.8 06/25/2014 1206  CALCIUM 9.3 04/21/2014 1039   PROT 7.7 06/25/2014 1206   PROT 7.2 04/21/2014 1039   ALBUMIN 4.2 06/25/2014 1206   ALBUMIN 4.1 04/21/2014 1039   AST 20 06/25/2014 1206   AST 26 04/21/2014 1039   ALT 22 06/25/2014 1206   ALT 29 04/21/2014 1039   ALKPHOS 90 06/25/2014 1206   ALKPHOS 73 04/21/2014 1039   BILITOT 0.37 06/25/2014 1206   BILITOT 0.9 04/21/2014 1039   GFRNONAA 76.42 02/23/2010 1053   GFRAA 83 02/13/2008 1027    INo results found for: SPEP, UPEP  Lab Results  Component Value Date   WBC 7.8 06/25/2014   NEUTROABS 5.4 06/25/2014   HGB 14.4 06/25/2014   HCT 43.4 06/25/2014   MCV 94.8 06/25/2014   PLT 269 06/25/2014      Chemistry      Component Value Date/Time   NA 142 06/25/2014 1206   NA 140 04/21/2014 1039   K 4.2 06/25/2014 1206   K 3.9 04/21/2014 1039   CL 106 04/21/2014 1039   CO2 26 06/25/2014 1206   CO2 26 04/21/2014 1039   BUN 9.8 06/25/2014 1206   BUN 16  04/21/2014 1039   CREATININE 0.9 06/25/2014 1206   CREATININE 1.0 04/21/2014 1039      Component Value Date/Time   CALCIUM 9.8 06/25/2014 1206   CALCIUM 9.3 04/21/2014 1039   ALKPHOS 90 06/25/2014 1206   ALKPHOS 73 04/21/2014 1039   AST 20 06/25/2014 1206   AST 26 04/21/2014 1039   ALT 22 06/25/2014 1206   ALT 29 04/21/2014 1039   BILITOT 0.37 06/25/2014 1206   BILITOT 0.9 04/21/2014 1039       No results found for: LABCA2  No components found for: LABCA125  No results for input(s): INR in the last 168 hours.  Urinalysis    Component Value Date/Time   COLORURINE yellow 02/13/2008 0911   APPEARANCEUR Clear 02/13/2008 0911   LABSPEC <1.005 02/13/2008 0911   PHURINE 7.5 02/13/2008 0911   HGBUR negative 02/13/2008 0911   BILIRUBINUR negative 02/13/2008 0911   UROBILINOGEN 0.2 02/13/2008 0911   NITRITE negative 02/13/2008 0911    STUDIES: Outside studies reviewed  ASSESSMENT: 64 y.o. [place] woman status post right breast biopsy 06/17/2014 for a clinical T1b N0, stage I invasive ductal carcinoma, grade 2, estrogen receptor strongly positive, progesterone receptor moderately positive, with no HER-2 amplification and an MIB-1 of 22%.  (1) breast conservation surgery with sentinel lymph node sampling planned  (2) Oncotype will be requested from the definitive surgical specimen to help decide the chemotherapy question  (3) the patient will require Adjuvant radiation therapy  (4) adjuvant anti-estrogens to follow radiation   PLAN: We spent the better part of today's hour-long appointment discussing the biology of breast cancer in general, and the specifics of the patient's tumor in particular. Jamie Dillon understands she has a small, node-negative tumor, which is intermediate in terms of its proliferation fraction and grade. She is a very good candidate for breast conservation and of course she will require some radiation to complete her local treatment  In terms of systemic  therapy, she will clearly benefit from anti-estrogens. Just as clearly she would get no benefit from anti-HER-2 treatment.  The more complicated decision relates to chemotherapy. The benefit is likely to be marginal. However it is not 0. To give [place] a better numerical understanding of the chemotherapy benefit we decided to send an Oncotype. I am hopeful this will fall in the low risk  group, but if it falls in the intermediate group, then most likely we would still op against chemotherapy, although that would require a longer discussion.  She understands that if we have node positivity or the tumor falls in the high-risk category with the Oncotype then we would recommend chemotherapy unambiguously.  The patient has a good understanding of the overall plan. She agrees with it. She knows the goal of treatment in her case is cure. She will call with any problems that may develop before her next visit here, which will be in approximately 5 weeks.Marland Kitchen  Jamie Cruel, MD   06/25/2014 4:03 PM Medical Oncology and Hematology [facility] [address] [place], [place] [address] Tel. [phone]    Fax. [phone]

## 2014-06-25 NOTE — Addendum Note (Signed)
Encounter addended by: Eppie Gibson, MD on: 06/25/2014  2:41 PM<BR>     Documentation filed: Notes Section

## 2014-06-25 NOTE — Therapy (Signed)
[place], [place], [address] Phone: [phone]   Fax:  [phone]  Physical Therapy Evaluation  Patient Details  Name: Jamie Dillon MRN: [id number] Date of Birth: 1950-07-23 Referring Provider:  Rolm Bookbinder, MD  Encounter Date: 06/25/2014      PT End of Session - 06/25/14 1557    Visit Number 1   Number of Visits 1   PT Start Time 7741   PT Stop Time 1420   PT Time Calculation (min) 25 min   Activity Tolerance Patient tolerated treatment well   Behavior During Therapy [facility] for tasks assessed/performed      Past Medical History  Diagnosis Date  . Allergic rhinitis   . HTN (hypertension)   . HLD (hyperlipidemia)   . Tobacco abuse   . Family history of malignant neoplasm of breast   . Family history of other kidney diseases   . Disorder of bone and cartilage, unspecified   . Breast cancer of lower-outer quadrant of right female breast 06/19/2014  . Anxiety     Past Surgical History  Procedure Laterality Date  . Total vaginal hysterectomy      endometriosis  . Abdominal hysterectomy    . Appendectomy      There were no vitals taken for this visit.  Visit Diagnosis:  Abnormal posture - Plan: PT plan of care cert/re-cert  Breast cancer, female, right - Plan: PT plan of care cert/re-cert      Subjective Assessment - 06/25/14 1548    Symptoms Patient is being assessed today for a baseline assessment of her newly diagnosed right breast cancer.   Pertinent History Patient was diagnosed on 06/18/14 with right ER/PR positive, HER2 negative grade 2 breast cancer with a Ki67 of 22%.  Her mass is 6 mm in size.   Patient Stated Goals Reduce lymphedema risk and learn post op shoulder ROM exercises.   Currently in Pain? No/denies          [facility] PT Assessment - 06/25/14 0001    Assessment   Medical Diagnosis Right breast cancer   Onset Date 06/18/14   Precautions   Precautions Other  (comment)  Active right breast cancer   Restrictions   Weight Bearing Restrictions No   Balance Screen   Has the patient fallen in the past 6 months No   Has the patient had a decrease in activity level because of a fear of falling?  No   Is the patient reluctant to leave their home because of a fear of falling?  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   Leisure She walks on her treadmill 3 times per week for 30 minutes and does some arm weights   Cognition   Overall Cognitive Status Within Functional Limits for tasks assessed   Posture/Postural Control   Posture/Postural Control Postural limitations   Postural Limitations Rounded Shoulders;Forward head   AROM   Right Shoulder Extension 65 Degrees   Right Shoulder Flexion 152 Degrees   Right Shoulder ABduction 165 Degrees   Right Shoulder Internal Rotation 75 Degrees   Right Shoulder External Rotation 75 Degrees   Left Shoulder Extension 70 Degrees   Left Shoulder Flexion 156 Degrees   Left  Shoulder ABduction 160 Degrees   Left Shoulder Internal Rotation 67 Degrees   Left Shoulder External Rotation 86 Degrees   Strength   Overall Strength Within functional limits for tasks performed           LYMPHEDEMA/ONCOLOGY QUESTIONNAIRE - 06/25/14 1554    Type   Cancer Type right breast   Lymphedema Assessments   Lymphedema Assessments Upper extremities   Right Upper Extremity Lymphedema   10 cm Proximal to Olecranon Process 30.2 cm   Olecranon Process 23.9 cm   10 cm Proximal to Ulnar Styloid Process 21.9 cm   Just Proximal to Ulnar Styloid Process 15.9 cm   Across Hand at [employer] 17.4 cm   At [place] of 2nd Digit 6.2 cm   Left Upper Extremity Lymphedema   10 cm  Proximal to Olecranon Process 28 cm   Olecranon Process 23.8 cm   10 cm Proximal to Ulnar Styloid Process 21 cm   Just Proximal to Ulnar Styloid Process 15 cm   Across Hand at [employer] 16.9 cm   At [place] of 2nd Digit 6 cm           PT Education - 06/25/14 1556    Education provided Yes   Education Details Post op shoulder ROM HEP and lymphedema risk reduction   Person(s) Educated Patient;Spouse;Child(ren)   Methods Explanation;Demonstration;Handout   Comprehension Verbalized understanding       Patient was instructed today in a home exercise program today for post op shoulder range of motion. These included active assist shoulder flexion in sitting, scapular retraction, wall walking with shoulder abduction, and hands behind head external rotation.  She was encouraged to do these twice a day, holding 3 seconds and repeating 5 times when permitted by her physician.           Breast Clinic Goals - 06/25/14 1602    Patient will be able to verbalize understanding of pertinent lymphedema risk reduction practices relevant to her diagnosis specifically related to skin care.   Time 1   Period Days   Status Achieved   Patient will be able to return demonstrate and/or verbalize understanding of the post-op home exercise program related to regaining shoulder range of motion.   Time 1   Period Days   Status Achieved   Patient will be able to verbalize understanding of the importance of attending the postoperative After Breast Cancer Class for further lymphedema risk reduction education and therapeutic exercise.   Time 1   Period Days   Status Achieved              Plan - 06/25/14 1557    Clinical Impression Statement Patient was diagnosed with right breast cancer on 06/18/14.  She is planning on having a right lumpectomy with a sentinel node biopsy followed by radiation and anti-estrogen therapy. She may benefit from therapy following surgery to regain ROM and strength.    Pt will benefit from skilled therapeutic intervention in order to improve on the following deficits Decreased range of motion;Increased edema;Decreased strength;Impaired UE functional use;Decreased knowledge of precautions   Rehab Potential Good   Clinical Impairments Affecting Rehab Potential none   PT Frequency One time visit   PT Treatment/Interventions Patient/family education;Therapeutic exercise   Consulted and Agree with Plan of Care Patient;Family member/caregiver   Family Member Consulted husband     Patient will follow up at outpatient cancer rehab if needed following surgery.  If the patient requires physical therapy at  that time, a specific plan will be dictated and sent to the referring physician for approval. The patient was educated today on appropriate basic range of motion exercises to begin post operatively and the importance of attending the After Breast Cancer class following surgery.  Patient was educated today on lymphedema risk reduction practices as it pertains to recommendations that will benefit the patient immediately following surgery.  She verbalized good understanding.  No additional physical therapy is indicated at this time.        G-Codes - 07/16/2014 1606    Functional Assessment Tool Used Clinical Judgement   Functional Limitation Other PT primary   Other PT Primary Current Status (Y7573) At least 1 percent but less than 20 percent impaired, limited or restricted   Other PT Primary Goal Status (A2567) At least 1 percent but less than 20 percent impaired, limited or restricted   Other PT Primary Discharge Status (C0919) At least 1 percent but less than 20 percent impaired, limited or restricted       Problem List Patient Active Problem List   Diagnosis Date Noted  . Breast cancer of lower-outer quadrant of right female breast 06/19/2014  . Encounter for Medicare annual wellness exam 04/25/2014  . Estrogen deficiency 04/25/2014  . ETD (eustachian tube  dysfunction) 01/10/2014  . Stress reaction 01/03/2014  . Palpitations 01/03/2014  . Gynecological examination 03/04/2011  . Routine general medical examination at a health care facility 02/26/2011  . HYPERCHOLESTEROLEMIA, PURE 02/01/2007  . Essential hypertension 09/08/2006  . ALLERGIC RHINITIS 09/08/2006  . TOBACCO ABUSE, HX OF 09/08/2006    Annia Friendly, PT 2014-07-16, 4:08 PM  [facility] [place], [place], [address] Phone: [phone]   Fax:  [phone]

## 2014-06-25 NOTE — Progress Notes (Signed)
Checked in new pt with no financial concerns prior to seeing the dr. Informed pt if chemo is part of her treatment Raquel will get auth from her ins company.  Raquel will also contact foundations that offer copay assistance for chemo if needed. She has Raquel's card to for any billing questions or concerns.

## 2014-06-26 ENCOUNTER — Other Ambulatory Visit (INDEPENDENT_AMBULATORY_CARE_PROVIDER_SITE_OTHER): Payer: Self-pay | Admitting: General Surgery

## 2014-06-26 DIAGNOSIS — C50511 Malignant neoplasm of lower-outer quadrant of right female breast: Secondary | ICD-10-CM

## 2014-06-27 ENCOUNTER — Encounter: Payer: Self-pay | Admitting: General Practice

## 2014-06-27 ENCOUNTER — Other Ambulatory Visit: Payer: Self-pay | Admitting: *Deleted

## 2014-06-27 NOTE — Progress Notes (Signed)
[place] [facility] Psychosocial Distress Screening Clinical Social Work  Visited with Ashlin, husband [name], and their daughter at breast clinic to introduce [facility] team/resources, and to review distress screen per protocol.  The patient scored a 10 on the Psychosocial Distress Thermometer which indicates severe distress. Also assessed for distress and other psychosocial needs.   ONCBCN DISTRESS SCREENING 06/27/2014  Screening Type Initial Screening  Distress experienced in past week (1-10) 10  Family Problem type Other (comment)  Emotional problem type Depression;Nervousness/Anxiety;Adjusting to illness  Spiritual/Religous concerns type Facing my mortality  Information Concerns Type Lack of info about diagnosis;Lack of info about treatment  Physical Problem type Sleep/insomnia  Referral to support programs Yes  Other Spiritual Care   Family shared in detail about pt's stressor of caring for her mother.  Although her mother now has 24/7 care at home, Maigen manages all other details (finances, errands, etc).  Family discussed difficult family relationships/dynamics, as well.  Further, Jaidah's sister died four years ago; per, pt, they had been a team in navigating family challenges, leaving Breeona now with grief and without support.  She reports emotional support from family, including son and grandchildren.  Aside from ongoing stressor of caregiving, lack of info about dx/tx was Josey's top concern; clinic and team helped reduce this stress.  Provided spiritual and emotional support as family shared and processed stressors, encouraging pt/family to use [facility] resources, including Spiritual Care and counseling interns, to assist with strategizing about self-care, boundaries, and caregiving needs.  Pt particularly resonated with need for self-care.  Follow up needed: No.  Will make referral to [facility] per pt's permission.  Pt and family have print materials about [facility].  Please also page as needs arise.  Thank you.  [place], [place]

## 2014-06-30 ENCOUNTER — Telehealth: Payer: Self-pay | Admitting: *Deleted

## 2014-06-30 NOTE — Telephone Encounter (Signed)
Spoke with patient from Jamie Dillon 2/10.  Confirmed follow up appointment for 08/04/14 at [facility].  Encouraged her to call with any needs or concerns.

## 2014-07-01 ENCOUNTER — Telehealth: Payer: Self-pay | Admitting: Oncology

## 2014-07-01 ENCOUNTER — Encounter: Payer: Self-pay | Admitting: Family Medicine

## 2014-07-01 ENCOUNTER — Other Ambulatory Visit: Payer: Self-pay | Admitting: Oncology

## 2014-07-01 ENCOUNTER — Ambulatory Visit (INDEPENDENT_AMBULATORY_CARE_PROVIDER_SITE_OTHER): Payer: Medicare Other | Admitting: Family Medicine

## 2014-07-01 VITALS — BP 160/100 | HR 93 | Temp 99.1°F | Ht 60.75 in | Wt 135.0 lb

## 2014-07-01 DIAGNOSIS — I10 Essential (primary) hypertension: Secondary | ICD-10-CM

## 2014-07-01 MED ORDER — LISINOPRIL-HYDROCHLOROTHIAZIDE 10-12.5 MG PO TABS: 1.0000 | ORAL_TABLET | Freq: Every day | ORAL | Status: DC

## 2014-07-01 NOTE — Progress Notes (Signed)
Subjective:    Patient ID: Jamie Dillon, female    DOB: Jul 12, 1950, 64 y.o.   MRN: [id number]  HPI Here for f/u of HTN   Was alerted by Dr Donne Hazel (recent breast ca) that her bp was high  Will do surgery and take some LN in the next several weeks Will do radiation Then medication to "block estrogen" - for 5 years   Thinks part is emotionally - new dx of breast cancer is very stressful  Also was elevated anyway   BP Readings from Last 3 Encounters:  07/01/14 162/112  06/25/14 178/92  04/25/14 145/90    Sometimes she feels "shaky on the inside" Has had a few headaches  No ankle swelling   On metoprolol alone   Lots of HTN in family  She avoids salt as much as she can     Chemistry      Component Value Date/Time   NA 142 06/25/2014 1206   NA 140 04/21/2014 1039   K 4.2 06/25/2014 1206   K 3.9 04/21/2014 1039   CL 106 04/21/2014 1039   CO2 26 06/25/2014 1206   CO2 26 04/21/2014 1039   BUN 9.8 06/25/2014 1206   BUN 16 04/21/2014 1039   CREATININE 0.9 06/25/2014 1206   CREATININE 1.0 04/21/2014 1039      Component Value Date/Time   CALCIUM 9.8 06/25/2014 1206   CALCIUM 9.3 04/21/2014 1039   ALKPHOS 90 06/25/2014 1206   ALKPHOS 73 04/21/2014 1039   AST 20 06/25/2014 1206   AST 26 04/21/2014 1039   ALT 22 06/25/2014 1206   ALT 29 04/21/2014 1039   BILITOT 0.37 06/25/2014 1206   BILITOT 0.9 04/21/2014 1039       Patient Active Problem List   Diagnosis Date Noted  . Breast cancer of lower-outer quadrant of right female breast 06/19/2014  . Encounter for Medicare annual wellness exam 04/25/2014  . Estrogen deficiency 04/25/2014  . ETD (eustachian tube dysfunction) 01/10/2014  . Stress reaction 01/03/2014  . Palpitations 01/03/2014  . Gynecological examination 03/04/2011  . Routine general medical examination at a health care facility 02/26/2011  . HYPERCHOLESTEROLEMIA, PURE 02/01/2007  . Essential hypertension 09/08/2006  . ALLERGIC RHINITIS 09/08/2006    . TOBACCO ABUSE, HX OF 09/08/2006   Past Medical History  Diagnosis Date  . Allergic rhinitis   . HTN (hypertension)   . HLD (hyperlipidemia)   . Tobacco abuse   . Family history of malignant neoplasm of breast   . Family history of other kidney diseases   . Disorder of bone and cartilage, unspecified   . Breast cancer of lower-outer quadrant of right female breast 06/19/2014  . Anxiety    Past Surgical History  Procedure Laterality Date  . Total vaginal hysterectomy      endometriosis  . Abdominal hysterectomy    . Appendectomy     History  Substance Use Topics  . Smoking status: Former Smoker -- 1.00 packs/day    Types: Cigarettes    Quit date: 06/25/2010  . Smokeless tobacco: Not on file  . Alcohol Use: 0.0 oz/week    0 Standard drinks or equivalent per week     Comment: occasional wine   Family History  Problem Relation Age of Onset  . Breast cancer Sister   . Hypertension Mother   . Kidney disease Father   . Diabetes Paternal Aunt    No Known Allergies Current Outpatient Prescriptions on File Prior to Visit  Medication Sig Dispense  Refill  . ALPRAZolam (XANAX) 0.5 MG tablet Take 1 tablet (0.5 mg total) by mouth daily as needed for anxiety. 30 tablet 1  . aspirin 81 MG tablet Take 81 mg by mouth daily.      Marland Kitchen atorvastatin (LIPITOR) 10 MG tablet Take 1 tablet (10 mg total) by mouth daily. 30 tablet 11  . b complex vitamins tablet Take 1 tablet by mouth daily.     . Calcium Carbonate-Vitamin D (CALCIUM-VITAMIN D) 500-200 MG-UNIT per tablet Take 3 tablets by mouth every morning.      . cetirizine (ZYRTEC) 10 MG tablet Take 10 mg by mouth daily as needed.     . diphenhydrAMINE (BENADRYL) 25 MG tablet Take 25 mg by mouth daily as needed.      . fluticasone (FLONASE) 50 MCG/ACT nasal spray Place 2 sprays into both nostrils daily. 16 g 6  . metoprolol succinate (TOPROL-XL) 50 MG 24 hr tablet Take 1 tablet (50 mg total) by mouth daily. Take with or immediately following a  meal. 30 tablet 11  . Multiple Vitamin (MULTIVITAMIN) tablet Take 1 tablet by mouth daily.       No current facility-administered medications on file prior to visit.    Review of Systems Review of Systems  Constitutional: Negative for fever, appetite change,  and unexpected weight change.  Eyes: Negative for pain and visual disturbance.  Respiratory: Negative for cough and shortness of breath.   Cardiovascular: Negative for cp or palpitations    Gastrointestinal: Negative for nausea, diarrhea and constipation.  Genitourinary: Negative for urgency and frequency.  Skin: Negative for pallor or rash   Neurological: Negative for weakness, light-headedness, numbness and occ headaches.  Hematological: Negative for adenopathy. Does not bruise/bleed easily.  Psychiatric/Behavioral: Negative for dysphoric mood. The patient is nervous/anxious.         Objective:   Physical Exam  Constitutional: She appears well-developed and well-nourished. No distress.  HENT:  Head: Normocephalic and atraumatic.  Mouth/Throat: Oropharynx is clear and moist.  Eyes: Conjunctivae and EOM are normal. Pupils are equal, round, and reactive to light. No scleral icterus.  Neck: Normal range of motion. Neck supple. No JVD present. Carotid bruit is not present.  Cardiovascular: Normal rate and regular rhythm.   Pulmonary/Chest: Effort normal and breath sounds normal. No respiratory distress. She has no wheezes. She has no rales.  Musculoskeletal: She exhibits no edema.  Lymphadenopathy:    She has no cervical adenopathy.  Neurological: She is alert. She has normal reflexes. No cranial nerve deficit. She exhibits normal muscle tone. Coordination normal.  Skin: Skin is warm and dry. No rash noted. No erythema. No pallor.  Psychiatric: She has a normal mood and affect.          Assessment & Plan:   Problem List Items Addressed This Visit      Cardiovascular and Mediastinum   Essential hypertension - Primary      bp is up - also in the midst of breast ca tx  (was going up before then) Continue metoprolol  Add lisinopril hct 10-12.5 daily-disc poss side eff in detail  Update if problems  F/u in March (has surg planned later this mo and will have labs done for that) Given copy of Dash diet guidelines         Relevant Medications   LISINOPRIL-HCTZ 10-12.5 MG PO TABS

## 2014-07-01 NOTE — Progress Notes (Signed)
Pre visit review using our clinic review tool, if applicable. No additional management support is needed unless otherwise documented below in the visit note. 

## 2014-07-01 NOTE — Assessment & Plan Note (Signed)
bp is up - also in the midst of breast ca tx  (was going up before then) Continue metoprolol  Add lisinopril hct 10-12.5 daily-disc poss side eff in detail  Update if problems  F/u in March (has surg planned later this mo and will have labs done for that) Given copy of Dash diet guidelines

## 2014-07-01 NOTE — Patient Instructions (Signed)
Start lisinopril hct 1 pill daily in the am  if cough or other side effects - let me know  Follow up with me in March as planned  If you buy a cuff for home testing - get OMRON for arm size regular    DASH Eating Plan DASH stands for "Dietary Approaches to Stop Hypertension." The DASH eating plan is a healthy eating plan that has been shown to reduce high blood pressure (hypertension). Additional health benefits may include reducing the risk of type 2 diabetes mellitus, heart disease, and stroke. The DASH eating plan may also help with weight loss. WHAT DO I NEED TO KNOW ABOUT THE DASH EATING PLAN? For the DASH eating plan, you will follow these general guidelines:  Choose foods with a percent daily value for sodium of less than 5% (as listed on the food label).  Use salt-free seasonings or herbs instead of table salt or sea salt.  Check with your health care provider or pharmacist before using salt substitutes.  Eat lower-sodium products, often labeled as "lower sodium" or "no salt added."  Eat fresh foods.  Eat more vegetables, fruits, and low-fat dairy products.  Choose whole grains. Look for the word "whole" as the first word in the ingredient list.  Choose fish and skinless chicken or [place] more often than red meat. Limit fish, poultry, and meat to 6 oz (170 g) each day.  Limit sweets, desserts, sugars, and sugary drinks.  Choose heart-healthy fats.  Limit cheese to 1 oz (28 g) per day.  Eat more home-cooked food and less restaurant, buffet, and fast food.  Limit fried foods.  Cook foods using methods other than frying.  Limit canned vegetables. If you do use them, rinse them well to decrease the sodium.  When eating at a restaurant, ask that your food be prepared with less salt, or no salt if possible. WHAT FOODS CAN I EAT? Seek help from a dietitian for individual calorie needs. Grains Whole grain or whole wheat bread. Brown rice. Whole grain or whole wheat pasta.  Quinoa, bulgur, and whole grain cereals. Low-sodium cereals. Corn or whole wheat flour tortillas. Whole grain cornbread. Whole grain crackers. Low-sodium crackers. Vegetables Fresh or frozen vegetables (raw, steamed, roasted, or grilled). Low-sodium or reduced-sodium tomato and vegetable juices. Low-sodium or reduced-sodium tomato sauce and paste. Low-sodium or reduced-sodium canned vegetables.  Fruits All fresh, canned (in natural juice), or frozen fruits. Meat and Other Protein Products Ground beef (85% or leaner), grass-fed beef, or beef trimmed of fat. Skinless chicken or [place]. Ground chicken or [place]. Pork trimmed of fat. All fish and seafood. Eggs. Dried beans, peas, or lentils. Unsalted nuts and seeds. Unsalted canned beans. Dairy Low-fat dairy products, such as skim or 1% milk, 2% or reduced-fat cheeses, low-fat ricotta or cottage cheese, or plain low-fat yogurt. Low-sodium or reduced-sodium cheeses. Fats and Oils Tub margarines without trans fats. Light or reduced-fat mayonnaise and salad dressings (reduced sodium). Avocado. Safflower, olive, or canola oils. Natural peanut or almond butter. Other Unsalted popcorn and pretzels. The items listed above may not be a complete list of recommended foods or beverages. Contact your dietitian for more options. WHAT FOODS ARE NOT RECOMMENDED? Grains White bread. White pasta. White rice. Refined cornbread. Bagels and croissants. Crackers that contain trans fat. Vegetables Creamed or fried vegetables. Vegetables in a cheese sauce. Regular canned vegetables. Regular canned tomato sauce and paste. Regular tomato and vegetable juices. Fruits Dried fruits. Canned fruit in light or heavy syrup. Fruit juice.  Meat and Other Protein Products Fatty cuts of meat. Ribs, chicken wings, bacon, sausage, bologna, salami, chitterlings, fatback, hot dogs, bratwurst, and packaged luncheon meats. Salted nuts and seeds. Canned beans with salt. Dairy Whole or 2%  milk, cream, half-and-half, and cream cheese. Whole-fat or sweetened yogurt. Full-fat cheeses or blue cheese. Nondairy creamers and whipped toppings. Processed cheese, cheese spreads, or cheese curds. Condiments Onion and garlic salt, seasoned salt, table salt, and sea salt. Canned and packaged gravies. Worcestershire sauce. Tartar sauce. Barbecue sauce. Teriyaki sauce. Soy sauce, including reduced sodium. Steak sauce. Fish sauce. Oyster sauce. Cocktail sauce. Horseradish. Ketchup and mustard. Meat flavorings and tenderizers. Bouillon cubes. Hot sauce. Tabasco sauce. Marinades. Taco seasonings. Relishes. Fats and Oils Butter, stick margarine, lard, shortening, ghee, and bacon fat. Coconut, palm kernel, or palm oils. Regular salad dressings. Other Pickles and olives. Salted popcorn and pretzels. The items listed above may not be a complete list of foods and beverages to avoid. Contact your dietitian for more information. WHERE CAN I FIND MORE INFORMATION? National Heart, Lung, and Blood Institute: travelstabloid.com Document Released: 04/21/2011 Document Revised: 09/16/2013 Document Reviewed: 03/06/2013 [facility] Patient Information 2015 [place], [place]. This information is not intended to replace advice given to you by your health care provider. Make sure you discuss any questions you have with your health care provider.

## 2014-07-01 NOTE — Telephone Encounter (Signed)
, °

## 2014-07-02 ENCOUNTER — Encounter (HOSPITAL_BASED_OUTPATIENT_CLINIC_OR_DEPARTMENT_OTHER): Payer: Self-pay | Admitting: *Deleted

## 2014-07-02 NOTE — Progress Notes (Signed)
Had labs done 06/25/14-ekg 8/15 Was having some anxiety and elevated bp-added lisinopril and xanax-can take xanax and htn meds am surgery All preop teaching done-all questions answered

## 2014-07-08 ENCOUNTER — Ambulatory Visit (INDEPENDENT_AMBULATORY_CARE_PROVIDER_SITE_OTHER): Payer: Medicare Other | Admitting: Family Medicine

## 2014-07-08 ENCOUNTER — Encounter: Payer: Self-pay | Admitting: Family Medicine

## 2014-07-08 ENCOUNTER — Encounter (HOSPITAL_COMMUNITY): Payer: Medicare Other

## 2014-07-08 VITALS — BP 145/90 | HR 75 | Temp 97.6°F | Ht 60.75 in | Wt 136.0 lb

## 2014-07-08 DIAGNOSIS — F419 Anxiety disorder, unspecified: Secondary | ICD-10-CM | POA: Insufficient documentation

## 2014-07-08 DIAGNOSIS — I10 Essential (primary) hypertension: Secondary | ICD-10-CM

## 2014-07-08 MED ORDER — ALPRAZOLAM 0.5 MG PO TABS: 0.5000 mg | ORAL_TABLET | Freq: Every day | ORAL | Status: DC | PRN

## 2014-07-08 MED ORDER — LISINOPRIL-HYDROCHLOROTHIAZIDE 20-25 MG PO TABS: 1.0000 | ORAL_TABLET | Freq: Every day | ORAL | Status: DC

## 2014-07-08 NOTE — Progress Notes (Signed)
Subjective:    Patient ID: Jamie Dillon, female    DOB: Jan 07, 1951, 64 y.o.   MRN: [id number]  HPI Here for elevated bp   2/16 - started lisinopril 10-12.5  Still on metoprolol as well   Last week she had a stomach virus as well - felt fine after she got over that   No symptoms from high bp   New cuff today - reading was 135/88   BP Readings from Last 3 Encounters:  07/08/14 158/90  07/01/14 160/100  06/25/14 178/92    Was 160/100 at surgical center  Could not do her surgery   Patient Active Problem List   Diagnosis Date Noted  . Breast cancer of lower-outer quadrant of right female breast 06/19/2014  . Encounter for Medicare annual wellness exam 04/25/2014  . Estrogen deficiency 04/25/2014  . ETD (eustachian tube dysfunction) 01/10/2014  . Stress reaction 01/03/2014  . Palpitations 01/03/2014  . Gynecological examination 03/04/2011  . Routine general medical examination at a health care facility 02/26/2011  . HYPERCHOLESTEROLEMIA, PURE 02/01/2007  . Essential hypertension 09/08/2006  . ALLERGIC RHINITIS 09/08/2006  . TOBACCO ABUSE, HX OF 09/08/2006   Past Medical History  Diagnosis Date  . Allergic rhinitis   . HTN (hypertension)   . HLD (hyperlipidemia)   . Tobacco abuse   . Family history of malignant neoplasm of breast   . Family history of other kidney diseases   . Disorder of bone and cartilage, unspecified   . Breast cancer of lower-outer quadrant of right female breast 06/19/2014  . Anxiety    Past Surgical History  Procedure Laterality Date  . Total vaginal hysterectomy  1996    endometriosis  . Abdominal hysterectomy    . Appendectomy    . Colonoscopy    . Dilation and curettage of uterus     History  Substance Use Topics  . Smoking status: Former Smoker -- 1.00 packs/day    Types: Cigarettes    Quit date: 06/25/2010  . Smokeless tobacco: Not on file  . Alcohol Use: 0.0 oz/week    0 Standard drinks or equivalent per week     Comment:  occasional wine   Family History  Problem Relation Age of Onset  . Breast cancer Sister   . Hypertension Mother   . Kidney disease Father   . Diabetes Paternal Aunt    No Known Allergies Current Outpatient Prescriptions on File Prior to Visit  Medication Sig Dispense Refill  . ALPRAZolam (XANAX) 0.5 MG tablet Take 1 tablet (0.5 mg total) by mouth daily as needed for anxiety. 30 tablet 1  . aspirin 81 MG tablet Take 81 mg by mouth daily.      Marland Kitchen atorvastatin (LIPITOR) 10 MG tablet Take 1 tablet (10 mg total) by mouth daily. (Patient taking differently: Take 10 mg by mouth daily at 6 PM. ) 30 tablet 11  . b complex vitamins tablet Take 1 tablet by mouth daily.     . Calcium Carbonate-Vitamin D (CALCIUM-VITAMIN D) 500-200 MG-UNIT per tablet Take 3 tablets by mouth every morning.      . cetirizine (ZYRTEC) 10 MG tablet Take 10 mg by mouth daily as needed.     . diphenhydrAMINE (BENADRYL) 25 MG tablet Take 25 mg by mouth daily as needed.      . fluticasone (FLONASE) 50 MCG/ACT nasal spray Place 2 sprays into both nostrils daily. 16 g 6  . lisinopril-hydrochlorothiazide (PRINZIDE,ZESTORETIC) 10-12.5 MG per tablet Take 1 tablet by mouth  daily. 30 tablet 3  . metoprolol succinate (TOPROL-XL) 50 MG 24 hr tablet Take 1 tablet (50 mg total) by mouth daily. Take with or immediately following a meal. 30 tablet 11  . Multiple Vitamin (MULTIVITAMIN) tablet Take 1 tablet by mouth daily.       No current facility-administered medications on file prior to visit.       Review of Systems Review of Systems  Constitutional: Negative for fever, appetite change, fatigue and unexpected weight change.  Eyes: Negative for pain and visual disturbance.  Respiratory: Negative for cough and shortness of breath.   Cardiovascular: Negative for cp or palpitations    Gastrointestinal: Negative for nausea, diarrhea and constipation.  Genitourinary: Negative for urgency and frequency.  Skin: Negative for pallor or  rash   Neurological: Negative for weakness, light-headedness, numbness and headaches.  Hematological: Negative for adenopathy. Does not bruise/bleed easily.  Psychiatric/Behavioral: Negative for dysphoric mood. The patient is somewhat anxious about upcoming breast ca tx        Objective:   Physical Exam  Constitutional: She appears well-developed and well-nourished. No distress.  HENT:  Head: Normocephalic and atraumatic.  Mouth/Throat: Oropharynx is clear and moist.  Eyes: Conjunctivae and EOM are normal. Pupils are equal, round, and reactive to light.  Neck: Normal range of motion. Neck supple. No JVD present. Carotid bruit is not present. No thyromegaly present.  Cardiovascular: Normal rate, regular rhythm, normal heart sounds and intact distal pulses.   Pulmonary/Chest: Effort normal and breath sounds normal. No respiratory distress. She has no wheezes. She has no rales.  Abdominal: Soft. Bowel sounds are normal. She exhibits no distension and no abdominal bruit. There is no tenderness. There is no rebound.  Musculoskeletal: She exhibits no edema.  Lymphadenopathy:    She has no cervical adenopathy.  Neurological: She is alert. She has normal reflexes. No cranial nerve deficit. She exhibits normal muscle tone. Coordination normal.  No tremor   Skin: Skin is warm and dry. No rash noted. No erythema. No pallor.  Psychiatric: She has a normal mood and affect.          Assessment & Plan:   Problem List Items Addressed This Visit      Cardiovascular and Mediastinum   Essential hypertension    Still not at goal for upcoming sugery Anxiety may also play a small role  Inc lisinopril hct to 20-25 mg total once daily-disc poss side eff F/u 1 week Disc DASH diet Lab at f/u      Relevant Medications   LISINOPRIL-HCTZ 20-25 MG PO TABS     Other   Anxiety - Primary    Refilled xanax for prn use - warning of sedation and habit potential  Briefly disc daily med like ssri- would  consider if worse/or counseling      Relevant Medications   ALPRAZolam Duanne Moron) tablet

## 2014-07-08 NOTE — Progress Notes (Signed)
Pre visit review using our clinic review tool, if applicable. No additional management support is needed unless otherwise documented below in the visit note. 

## 2014-07-08 NOTE — Patient Instructions (Addendum)
Increase lisinopril hct to 20-12.5 mg once daily    (that is 2 pills of what you have once daily until you get the new px )  Here is a printed px of the new dose to fill when you run out of what you have  Use xanax as needed with caution of sedation  Check bp at home when relaxed   Follow up with me in a week for visit (we will do labs that day)

## 2014-07-09 NOTE — Assessment & Plan Note (Signed)
Refilled xanax for prn use - warning of sedation and habit potential  Briefly disc daily med like ssri- would consider if worse/or counseling

## 2014-07-09 NOTE — Assessment & Plan Note (Signed)
Still not at goal for upcoming sugery Anxiety may also play a small role  Inc lisinopril hct to 20-25 mg total once daily-disc poss side eff F/u 1 week Disc DASH diet Lab at f/u

## 2014-07-15 ENCOUNTER — Encounter: Payer: Self-pay | Admitting: Family Medicine

## 2014-07-15 ENCOUNTER — Ambulatory Visit (INDEPENDENT_AMBULATORY_CARE_PROVIDER_SITE_OTHER): Payer: Medicare Other | Admitting: Family Medicine

## 2014-07-15 VITALS — BP 122/80 | HR 72 | Temp 98.1°F | Ht 61.0 in | Wt 133.1 lb

## 2014-07-15 DIAGNOSIS — I10 Essential (primary) hypertension: Secondary | ICD-10-CM

## 2014-07-15 LAB — BASIC METABOLIC PANEL
BUN: 19 mg/dL (ref 6–23)
CO2: 30 mEq/L (ref 19–32)
Calcium: 10 mg/dL (ref 8.4–10.5)
Chloride: 103 mEq/L (ref 96–112)
Creatinine, Ser: 0.86 mg/dL (ref 0.40–1.20)
GFR: 70.31 mL/min (ref >60.00)
Glucose, Bld: 100 mg/dL — ABNORMAL HIGH (ref 70–99)
Potassium: 4.3 mEq/L (ref 3.5–5.1)
Sodium: 138 mEq/L (ref 135–145)

## 2014-07-15 NOTE — Assessment & Plan Note (Signed)
bp in fair control at this time -after inc of lisinopril hct dowe  BP Readings from Last 1 Encounters:  07/15/14 122/80   No changes needed Disc lifstyle change with low sodium diet and exercise  Lab today Is ok for her breast surgery now

## 2014-07-15 NOTE — Progress Notes (Signed)
Pre visit review using our clinic review tool, if applicable. No additional management support is needed unless otherwise documented below in the visit note. 

## 2014-07-15 NOTE — Patient Instructions (Signed)
Labs today  Blood pressure is much improved  Take care of yourself  You are cleared for surgery

## 2014-07-15 NOTE — Progress Notes (Signed)
Subjective:    Patient ID: Jamie Dillon, female    DOB: 1950-12-04, 64 y.o.   MRN: [id number]  HPI  Here for f/u of HTN   A little tired in the afternoons  bp is improved today  No cp or palpitations or headaches or edema  No side effects to medicines  BP Readings from Last 3 Encounters:  07/15/14 122/80  07/08/14 145/90  07/01/14 160/100     Not urinating a lot   Patient Active Problem List   Diagnosis Date Noted  . Anxiety 07/08/2014  . Breast cancer of lower-outer quadrant of right female breast 06/19/2014  . Encounter for Medicare annual wellness exam 04/25/2014  . Estrogen deficiency 04/25/2014  . ETD (eustachian tube dysfunction) 01/10/2014  . Stress reaction 01/03/2014  . Palpitations 01/03/2014  . Gynecological examination 03/04/2011  . Routine general medical examination at a health care facility 02/26/2011  . HYPERCHOLESTEROLEMIA, PURE 02/01/2007  . Essential hypertension 09/08/2006  . ALLERGIC RHINITIS 09/08/2006  . TOBACCO ABUSE, HX OF 09/08/2006   Past Medical History  Diagnosis Date  . Allergic rhinitis   . HTN (hypertension)   . HLD (hyperlipidemia)   . Tobacco abuse   . Family history of malignant neoplasm of breast   . Family history of other kidney diseases   . Disorder of bone and cartilage, unspecified   . Breast cancer of lower-outer quadrant of right female breast 06/19/2014  . Anxiety    Past Surgical History  Procedure Laterality Date  . Total vaginal hysterectomy  1996    endometriosis  . Abdominal hysterectomy    . Appendectomy    . Colonoscopy    . Dilation and curettage of uterus     History  Substance Use Topics  . Smoking status: Former Smoker -- 1.00 packs/day    Types: Cigarettes    Quit date: 06/25/2010  . Smokeless tobacco: Not on file  . Alcohol Use: 0.0 oz/week    0 Standard drinks or equivalent per week     Comment: occasional wine   Family History  Problem Relation Age of Onset  . Breast cancer Sister   .  Hypertension Mother   . Kidney disease Father   . Diabetes Paternal Aunt    No Known Allergies Current Outpatient Prescriptions on File Prior to Visit  Medication Sig Dispense Refill  . ALPRAZolam (XANAX) 0.5 MG tablet Take 1 tablet (0.5 mg total) by mouth daily as needed for anxiety. 30 tablet 1  . atorvastatin (LIPITOR) 10 MG tablet Take 1 tablet (10 mg total) by mouth daily. (Patient taking differently: Take 10 mg by mouth daily at 6 PM. ) 30 tablet 11  . b complex vitamins tablet Take 1 tablet by mouth daily.     . Calcium Carbonate-Vitamin D (CALCIUM-VITAMIN D) 500-200 MG-UNIT per tablet Take 3 tablets by mouth every morning.      . cetirizine (ZYRTEC) 10 MG tablet Take 10 mg by mouth daily as needed for allergies.     . diphenhydrAMINE (BENADRYL) 25 MG tablet Take 25 mg by mouth daily as needed for allergies.     . fluticasone (FLONASE) 50 MCG/ACT nasal spray Place 2 sprays into both nostrils daily. (Patient taking differently: Place 2 sprays into both nostrils daily as needed for allergies. ) 16 g 6  . lisinopril-hydrochlorothiazide (PRINZIDE,ZESTORETIC) 20-25 MG per tablet Take 1 tablet by mouth daily. 30 tablet 11  . metoprolol succinate (TOPROL-XL) 50 MG 24 hr tablet Take 1 tablet (50 mg  total) by mouth daily. Take with or immediately following a meal. 30 tablet 11  . Multiple Vitamin (MULTIVITAMIN) tablet Take 1 tablet by mouth daily.       No current facility-administered medications on file prior to visit.      Review of Systems Review of Systems  Constitutional: Negative for fever, appetite change, fatigue and unexpected weight change.  Eyes: Negative for pain and visual disturbance.  Respiratory: Negative for cough and shortness of breath.   Cardiovascular: Negative for cp or palpitations    Gastrointestinal: Negative for nausea, diarrhea and constipation.  Genitourinary: Negative for urgency and frequency.  Skin: Negative for pallor or rash   Neurological: Negative for  weakness, light-headedness, numbness and headaches.  Hematological: Negative for adenopathy. Does not bruise/bleed easily.  Psychiatric/Behavioral: Negative for dysphoric mood. The patient is not nervous/anxious.         Objective:   Physical Exam  Constitutional: She appears well-developed and well-nourished. No distress.  HENT:  Head: Normocephalic and atraumatic.  Mouth/Throat: Oropharynx is clear and moist.  Eyes: Conjunctivae and EOM are normal. Pupils are equal, round, and reactive to light.  Neck: Normal range of motion. Neck supple. No JVD present. Carotid bruit is not present.  Cardiovascular: Normal rate, regular rhythm and normal heart sounds.   Pulmonary/Chest: Effort normal and breath sounds normal. No respiratory distress. She has no wheezes. She has no rales.  Musculoskeletal: She exhibits no edema.  Lymphadenopathy:    She has no cervical adenopathy.  Neurological: She is alert. She has normal reflexes.  Skin: Skin is warm and dry. No rash noted. No pallor.  Psychiatric: She has a normal mood and affect.          Assessment & Plan:   Problem List Items Addressed This Visit      Cardiovascular and Mediastinum   Essential hypertension - Primary    bp in fair control at this time -after inc of lisinopril hct dowe  BP Readings from Last 1 Encounters:  07/15/14 122/80   No changes needed Disc lifstyle change with low sodium diet and exercise  Lab today Is ok for her breast surgery now        Relevant Orders   Basic metabolic panel

## 2014-07-16 ENCOUNTER — Encounter: Payer: Self-pay | Admitting: *Deleted

## 2014-07-20 MED ORDER — CEFAZOLIN SODIUM-DEXTROSE 2-3 GM-% IV SOLR: 2.0000 g | INTRAVENOUS | Status: DC

## 2014-07-21 ENCOUNTER — Encounter (HOSPITAL_COMMUNITY)
Admission: RE | Admit: 2014-07-21 | Discharge: 2014-07-21 | Disposition: A | Discharge status: Home / Self Care | Payer: Medicare Other | Source: Ambulatory Visit | Attending: General Surgery | Admitting: General Surgery

## 2014-07-21 ENCOUNTER — Encounter (HOSPITAL_COMMUNITY)
Admission: RE | Disposition: A | Discharge status: Home / Self Care | Payer: Self-pay | Source: Ambulatory Visit | Attending: General Surgery

## 2014-07-21 ENCOUNTER — Encounter (HOSPITAL_COMMUNITY): Payer: Self-pay | Admitting: Certified Registered Nurse Anesthetist

## 2014-07-21 ENCOUNTER — Ambulatory Visit (HOSPITAL_COMMUNITY): Payer: Medicare Other | Admitting: Certified Registered Nurse Anesthetist

## 2014-07-21 ENCOUNTER — Ambulatory Visit (HOSPITAL_COMMUNITY)
Admission: RE | Admit: 2014-07-21 | Discharge: 2014-07-21 | Disposition: A | Discharge status: Home / Self Care | Payer: Medicare Other | Source: Ambulatory Visit | Attending: General Surgery | Admitting: General Surgery

## 2014-07-21 DIAGNOSIS — I10 Essential (primary) hypertension: Secondary | ICD-10-CM | POA: Diagnosis not present

## 2014-07-21 DIAGNOSIS — E780 Pure hypercholesterolemia: Secondary | ICD-10-CM | POA: Insufficient documentation

## 2014-07-21 DIAGNOSIS — Z87891 Personal history of nicotine dependence: Secondary | ICD-10-CM | POA: Diagnosis not present

## 2014-07-21 DIAGNOSIS — C50911 Malignant neoplasm of unspecified site of right female breast: Secondary | ICD-10-CM | POA: Diagnosis present

## 2014-07-21 DIAGNOSIS — Z79899 Other long term (current) drug therapy: Secondary | ICD-10-CM | POA: Insufficient documentation

## 2014-07-21 DIAGNOSIS — Z803 Family history of malignant neoplasm of breast: Secondary | ICD-10-CM | POA: Diagnosis not present

## 2014-07-21 DIAGNOSIS — C50511 Malignant neoplasm of lower-outer quadrant of right female breast: Secondary | ICD-10-CM

## 2014-07-21 HISTORY — DX: Other specified postprocedural states: Z98.890

## 2014-07-21 HISTORY — PX: BREAST LUMPECTOMY WITH NEEDLE LOCALIZATION AND AXILLARY SENTINEL LYMPH NODE BX: SHX5760

## 2014-07-21 HISTORY — DX: Other specified postprocedural states: R11.2

## 2014-07-21 LAB — CBC
HEMATOCRIT: 38.9 % (ref 36.0–46.0)
Hemoglobin: 13.4 g/dL (ref 12.0–15.0)
MCH: 31.6 pg (ref 26.0–34.0)
MCHC: 34.4 g/dL (ref 30.0–36.0)
MCV: 91.7 fL (ref 78.0–100.0)
Platelets: 257 10*3/uL (ref 150–400)
RBC: 4.24 MIL/uL (ref 3.87–5.11)
RDW: 12.4 % (ref 11.5–15.5)
WBC: 6.5 10*3/uL (ref 4.0–10.5)

## 2014-07-21 SURGERY — BREAST LUMPECTOMY WITH NEEDLE LOCALIZATION AND AXILLARY SENTINEL LYMPH NODE BX
Anesthesia: Regional | Site: Breast | Laterality: Right

## 2014-07-21 MED ORDER — MIDAZOLAM HCL 5 MG/5ML IJ SOLN
INTRAMUSCULAR | Status: DC | PRN
  Administered 2014-07-21: 2 mg via INTRAVENOUS

## 2014-07-21 MED ORDER — ACETAMINOPHEN 650 MG RE SUPP
650.0000 mg | RECTAL | Status: DC | PRN
  Filled 2014-07-21: qty 1

## 2014-07-21 MED ORDER — FENTANYL CITRATE 0.05 MG/ML IJ SOLN
INTRAMUSCULAR | Status: DC | PRN
  Administered 2014-07-21 (×3): 50 ug via INTRAVENOUS

## 2014-07-21 MED ORDER — DEXAMETHASONE SODIUM PHOSPHATE 4 MG/ML IJ SOLN
INTRAMUSCULAR | Status: DC | PRN
  Administered 2014-07-21: 4 mg via INTRAVENOUS

## 2014-07-21 MED ORDER — MIDAZOLAM HCL 2 MG/2ML IJ SOLN
INTRAMUSCULAR | Status: AC
  Filled 2014-07-21: qty 2

## 2014-07-21 MED ORDER — PROPOFOL 10 MG/ML IV BOLUS
INTRAVENOUS | Status: DC | PRN
  Administered 2014-07-21: 150 mg via INTRAVENOUS

## 2014-07-21 MED ORDER — FENTANYL CITRATE 0.05 MG/ML IJ SOLN
INTRAMUSCULAR | Status: AC
  Filled 2014-07-21: qty 5

## 2014-07-21 MED ORDER — BUPIVACAINE-EPINEPHRINE (PF) 0.25% -1:200000 IJ SOLN
INTRAMUSCULAR | Status: AC
  Filled 2014-07-21: qty 30

## 2014-07-21 MED ORDER — LIDOCAINE HCL (CARDIAC) 20 MG/ML IV SOLN
INTRAVENOUS | Status: DC | PRN
  Administered 2014-07-21: 20 mg via INTRAVENOUS

## 2014-07-21 MED ORDER — ONDANSETRON HCL 4 MG/2ML IJ SOLN
INTRAMUSCULAR | Status: AC
  Filled 2014-07-21: qty 2

## 2014-07-21 MED ORDER — LACTATED RINGERS IV SOLN
INTRAVENOUS | Status: DC | PRN
  Administered 2014-07-21: 12:00:00 via INTRAVENOUS

## 2014-07-21 MED ORDER — ROCURONIUM BROMIDE 50 MG/5ML IV SOLN
INTRAVENOUS | Status: AC
  Filled 2014-07-21: qty 1

## 2014-07-21 MED ORDER — METHYLENE BLUE 1 % INJ SOLN
INTRAMUSCULAR | Status: AC
  Filled 2014-07-21: qty 10

## 2014-07-21 MED ORDER — TECHNETIUM TC 99M SULFUR COLLOID FILTERED
2.0000 | Freq: Once | INTRAVENOUS | Status: AC | PRN
  Administered 2014-07-21: 2 via INTRADERMAL

## 2014-07-21 MED ORDER — PHENYLEPHRINE HCL 10 MG/ML IJ SOLN
INTRAMUSCULAR | Status: DC | PRN
  Administered 2014-07-21 (×2): 80 ug via INTRAVENOUS

## 2014-07-21 MED ORDER — CEFAZOLIN SODIUM-DEXTROSE 2-3 GM-% IV SOLR
INTRAVENOUS | Status: AC
  Administered 2014-07-21: 2 g via INTRAVENOUS
  Filled 2014-07-21: qty 50

## 2014-07-21 MED ORDER — PHENYLEPHRINE 40 MCG/ML (10ML) SYRINGE FOR IV PUSH (FOR BLOOD PRESSURE SUPPORT)
PREFILLED_SYRINGE | INTRAVENOUS | Status: AC
  Filled 2014-07-21: qty 10

## 2014-07-21 MED ORDER — LIDOCAINE HCL (CARDIAC) 20 MG/ML IV SOLN
INTRAVENOUS | Status: AC
  Filled 2014-07-21: qty 5

## 2014-07-21 MED ORDER — HYDROMORPHONE HCL 1 MG/ML IJ SOLN: 0.2500 mg | INTRAMUSCULAR | Status: DC | PRN

## 2014-07-21 MED ORDER — DEXAMETHASONE SODIUM PHOSPHATE 4 MG/ML IJ SOLN
INTRAMUSCULAR | Status: AC
  Filled 2014-07-21: qty 1

## 2014-07-21 MED ORDER — OXYCODONE-ACETAMINOPHEN 10-325 MG PO TABS: 1.0000 | ORAL_TABLET | Freq: Four times a day (QID) | ORAL | Status: DC | PRN

## 2014-07-21 MED ORDER — SODIUM CHLORIDE 0.9 % IV SOLN: INTRAVENOUS | Status: DC

## 2014-07-21 MED ORDER — BUPIVACAINE-EPINEPHRINE (PF) 0.5% -1:200000 IJ SOLN
INTRAMUSCULAR | Status: DC | PRN
  Administered 2014-07-21: 30 mL

## 2014-07-21 MED ORDER — ACETAMINOPHEN 325 MG PO TABS
650.0000 mg | ORAL_TABLET | ORAL | Status: DC | PRN
  Filled 2014-07-21: qty 2

## 2014-07-21 MED ORDER — FENTANYL CITRATE 0.05 MG/ML IJ SOLN
INTRAMUSCULAR | Status: AC
  Filled 2014-07-21: qty 2

## 2014-07-21 MED ORDER — SCOPOLAMINE 1 MG/3DAYS TD PT72
1.0000 | MEDICATED_PATCH | Freq: Once | TRANSDERMAL | Status: DC
  Administered 2014-07-21: 1 via TRANSDERMAL

## 2014-07-21 MED ORDER — 0.9 % SODIUM CHLORIDE (POUR BTL) OPTIME
TOPICAL | Status: DC | PRN
  Administered 2014-07-21: 1000 mL

## 2014-07-21 MED ORDER — OXYCODONE HCL 5 MG PO TABS: 5.0000 mg | ORAL_TABLET | ORAL | Status: DC | PRN

## 2014-07-21 SURGICAL SUPPLY — 58 items
APL SKNCLS STERI-STRIP NONHPOA (GAUZE/BANDAGES/DRESSINGS)
APPLIER CLIP 9.375 MED OPEN (MISCELLANEOUS) ×3
APR CLP MED 9.3 20 MLT OPN (MISCELLANEOUS) ×1
BENZOIN TINCTURE PRP APPL 2/3 (GAUZE/BANDAGES/DRESSINGS) IMPLANT
BINDER BREAST LRG (GAUZE/BANDAGES/DRESSINGS) ×2 IMPLANT
BINDER BREAST XLRG (GAUZE/BANDAGES/DRESSINGS) IMPLANT
CANISTER SUCTION 2500CC (MISCELLANEOUS) ×3 IMPLANT
CHLORAPREP W/TINT 26ML (MISCELLANEOUS) ×3 IMPLANT
CLIP APPLIE 9.375 MED OPEN (MISCELLANEOUS) ×1 IMPLANT
CLOSURE WOUND 1/2 X4 (GAUZE/BANDAGES/DRESSINGS) ×1
CONT SPEC STER OR (MISCELLANEOUS) ×5 IMPLANT
COVER PROBE W GEL 5X96 (DRAPES) ×3 IMPLANT
COVER SURGICAL LIGHT HANDLE (MISCELLANEOUS) ×3 IMPLANT
DEVICE DUBIN SPECIMEN MAMMOGRA (MISCELLANEOUS) ×3 IMPLANT
DRAPE CHEST BREAST 15X10 FENES (DRAPES) ×3 IMPLANT
DRSG PAD ABDOMINAL 8X10 ST (GAUZE/BANDAGES/DRESSINGS) IMPLANT
DRSG TEGADERM 4X4.75 (GAUZE/BANDAGES/DRESSINGS) IMPLANT
ELECT CAUTERY BLADE 6.4 (BLADE) ×3 IMPLANT
ELECT REM PT RETURN 9FT ADLT (ELECTROSURGICAL) ×3
ELECTRODE REM PT RTRN 9FT ADLT (ELECTROSURGICAL) ×1 IMPLANT
GAUZE SPONGE 4X4 12PLY STRL (GAUZE/BANDAGES/DRESSINGS) IMPLANT
GLOVE BIO SURGEON STRL SZ7 (GLOVE) ×5 IMPLANT
GLOVE BIOGEL PI IND STRL 7.0 (GLOVE) IMPLANT
GLOVE BIOGEL PI IND STRL 7.5 (GLOVE) ×1 IMPLANT
GLOVE BIOGEL PI INDICATOR 7.0 (GLOVE) ×2
GLOVE BIOGEL PI INDICATOR 7.5 (GLOVE) ×6
GLOVE SURG SS PI 7.0 STRL IVOR (GLOVE) ×2 IMPLANT
GOWN STRL REUS W/ TWL LRG LVL3 (GOWN DISPOSABLE) ×2 IMPLANT
GOWN STRL REUS W/TWL LRG LVL3 (GOWN DISPOSABLE) ×9
KIT BASIN OR (CUSTOM PROCEDURE TRAY) ×3 IMPLANT
KIT MARKER MARGIN INK (KITS) ×2 IMPLANT
KIT ROOM TURNOVER OR (KITS) ×3 IMPLANT
LIQUID BAND (GAUZE/BANDAGES/DRESSINGS) ×2 IMPLANT
NDL 18GX1X1/2 (RX/OR ONLY) (NEEDLE) IMPLANT
NDL HYPO 25GX1X1/2 BEV (NEEDLE) ×1 IMPLANT
NEEDLE 18GX1X1/2 (RX/OR ONLY) (NEEDLE) IMPLANT
NEEDLE HYPO 25GX1X1/2 BEV (NEEDLE) ×3 IMPLANT
NS IRRIG 1000ML POUR BTL (IV SOLUTION) ×3 IMPLANT
PACK SURGICAL SETUP 50X90 (CUSTOM PROCEDURE TRAY) ×3 IMPLANT
PAD ARMBOARD 7.5X6 YLW CONV (MISCELLANEOUS) ×3 IMPLANT
PENCIL BUTTON HOLSTER BLD 10FT (ELECTRODE) ×3 IMPLANT
SPONGE GAUZE 4X4 12PLY STER LF (GAUZE/BANDAGES/DRESSINGS) ×2 IMPLANT
SPONGE LAP 18X18 X RAY DECT (DISPOSABLE) ×3 IMPLANT
STAPLER VISISTAT 35W (STAPLE) ×3 IMPLANT
STRIP CLOSURE SKIN 1/2X4 (GAUZE/BANDAGES/DRESSINGS) ×1 IMPLANT
SUT MNCRL AB 4-0 PS2 18 (SUTURE) ×6 IMPLANT
SUT MON AB 5-0 PS2 18 (SUTURE) ×2 IMPLANT
SUT SILK 2 0 SH (SUTURE) IMPLANT
SUT VIC AB 2-0 SH 27 (SUTURE) ×6
SUT VIC AB 2-0 SH 27XBRD (SUTURE) ×2 IMPLANT
SUT VIC AB 3-0 SH 27 (SUTURE) ×6
SUT VIC AB 3-0 SH 27XBRD (SUTURE) ×2 IMPLANT
SYR CONTROL 10ML LL (SYRINGE) ×3 IMPLANT
TOWEL OR 17X24 6PK STRL BLUE (TOWEL DISPOSABLE) ×1 IMPLANT
TOWEL OR 17X26 10 PK STRL BLUE (TOWEL DISPOSABLE) ×3 IMPLANT
TUBE CONNECTING 12'X1/4 (SUCTIONS) ×1
TUBE CONNECTING 12X1/4 (SUCTIONS) ×2 IMPLANT
YANKAUER SUCT BULB TIP NO VENT (SUCTIONS) ×3 IMPLANT

## 2014-07-21 NOTE — Anesthesia Preprocedure Evaluation (Addendum)
Anesthesia Evaluation  Patient identified by MRN, date of birth, ID band Patient awake    Reviewed: Allergy & Precautions, H&P , NPO status , Patient's Chart, lab work & pertinent test results, reviewed documented beta blocker date and time   History of Anesthesia Complications (+) PONV  Airway Mallampati: II  TM Distance: >3 FB Neck ROM: Full    Dental no notable dental hx. (+) Teeth Intact, Dental Advisory Given   Pulmonary neg pulmonary ROS, former smoker,  breath sounds clear to auscultation  Pulmonary exam normal       Cardiovascular hypertension, Pt. on medications and Pt. on home beta blockers Rhythm:Regular Rate:Normal     Neuro/Psych Anxiety negative neurological ROS     GI/Hepatic negative GI ROS, Neg liver ROS,   Endo/Other  negative endocrine ROS  Renal/GU negative Renal ROS  negative genitourinary   Musculoskeletal   Abdominal   Peds  Hematology negative hematology ROS (+)   Anesthesia Other Findings   Reproductive/Obstetrics negative OB ROS                            Anesthesia Physical Anesthesia Plan  ASA: II  Anesthesia Plan: General and Regional   Post-op Pain Management:    Induction: Intravenous  Airway Management Planned: LMA  Additional Equipment:   Intra-op Plan:   Post-operative Plan: Extubation in OR  Informed Consent: I have reviewed the patients History and Physical, chart, labs and discussed the procedure including the risks, benefits and alternatives for the proposed anesthesia with the patient or authorized representative who has indicated his/her understanding and acceptance.   Dental advisory given  Plan Discussed with: CRNA  Anesthesia Plan Comments:         Anesthesia Quick Evaluation

## 2014-07-21 NOTE — Anesthesia Postprocedure Evaluation (Signed)
  Anesthesia Post-op Note  Patient: Jamie Dillon  Procedure(s) Performed: Procedure(s): BREAST LUMPECTOMY WITH NEEDLE LOCALIZATION AND AXILLARY SENTINEL LYMPH NODE BIOPSY (Right)  Patient Location: PACU  Anesthesia Type:General and block  Level of Consciousness: awake and alert   Airway and Oxygen Therapy: Patient Spontanous Breathing  Post-op Pain: none  Post-op Assessment: Post-op Vital signs reviewed, Patient's Cardiovascular Status Stable and Respiratory Function Stable  Post-op Vital Signs: Reviewed  Filed Vitals:   07/21/14 1400  BP: 121/71  Pulse: 91  Temp: 36.6 C  Resp: 36    Complications: No apparent anesthesia complications

## 2014-07-21 NOTE — Transfer of Care (Signed)
Immediate Anesthesia Transfer of Care Note  Patient: Jamie Dillon  Procedure(s) Performed: Procedure(s): BREAST LUMPECTOMY WITH NEEDLE LOCALIZATION AND AXILLARY SENTINEL LYMPH NODE BIOPSY (Right)  Patient Location: PACU  Anesthesia Type:General  Level of Consciousness: awake and alert   Airway & Oxygen Therapy: Patient Spontanous Breathing and Patient connected to nasal cannula oxygen  Post-op Assessment: Report given to RN and Post -op Vital signs reviewed and stable  Post vital signs: Reviewed and stable  Last Vitals:  Filed Vitals:   07/21/14 1129  BP: 139/79  Pulse: 77  Temp: 36.5 C  Resp: 20    Complications: No apparent anesthesia complications

## 2014-07-21 NOTE — Interval H&P Note (Signed)
History and Physical Interval Note:  07/21/2014 11:21 AM  Jamie Dillon  has presented today for surgery, with the diagnosis of RIGHT BREAST CANCER  The various methods of treatment have been discussed with the patient and family. After consideration of risks, benefits and other options for treatment, the patient has consented to  Procedure(s): BREAST LUMPECTOMY WITH NEEDLE LOCALIZATION AND AXILLARY SENTINEL LYMPH NODE BX (Right) as a surgical intervention .  The patient's history has been reviewed, patient examined, no change in status, stable for surgery.  I have reviewed the patient's chart and labs.  Questions were answered to the patient's satisfaction.     [name]

## 2014-07-21 NOTE — Anesthesia Procedure Notes (Addendum)
Anesthesia Regional Block:  Pectoralis block  Pre-Anesthetic Checklist: ,, timeout performed, Correct Patient, Correct Site, Correct Laterality, Correct Procedure, Correct Position, site marked, Risks and benefits discussed, pre-op evaluation, post-op pain management  Laterality: Right  Prep: Maximum Sterile Barrier Precautions used and chloraprep       Needles:  Injection technique: Single-shot  Needle Type: Echogenic Stimulator Needle     Needle Length: 9cm 9 cm Needle Gauge: 21 and 21 G    Additional Needles:  Procedures: ultrasound guided (picture in chart) Pectoralis block Narrative:  Start time: 07/21/2014 11:50 AM End time: 07/21/2014 11:57 AM Injection made incrementally with aspirations every 5 mL. Anesthesiologist: Roderic Palau  Additional Notes: 2% Lidocaine skin wheel.   Procedure Name: LMA Insertion Date/Time: 07/21/2014 12:23 PM Performed by: Ollen Bowl Pre-anesthesia Checklist: Patient identified, Emergency Drugs available, Suction available, Patient being monitored and Timeout performed Patient Re-evaluated:Patient Re-evaluated prior to inductionOxygen Delivery Method: Circle system utilized and Simple face mask Preoxygenation: Pre-oxygenation with 100% oxygen Intubation Type: IV induction Ventilation: Mask ventilation without difficulty LMA: LMA inserted Tube size: 4.0 mm Number of attempts: 1 Airway Equipment and Method: Patient positioned with wedge pillow Placement Confirmation: positive ETCO2 and breath sounds checked- equal and bilateral Tube secured with: Tape Dental Injury: Teeth and Oropharynx as per pre-operative assessment

## 2014-07-21 NOTE — Discharge Instructions (Signed)
[facility] °Office Phone Number [phone] °POST OP INSTRUCTIONS ° °Always review your discharge instruction sheet given to you by the facility where your surgery was performed. ° °IF YOU HAVE DISABILITY OR FAMILY LEAVE FORMS, YOU MUST BRING THEM TO THE OFFICE FOR PROCESSING.  DO NOT GIVE THEM TO YOUR DOCTOR. ° °1. A prescription for pain medication may be given to you upon discharge.  Take your pain medication as prescribed, if needed.  If narcotic pain medicine is not needed, then you may take acetaminophen (Tylenol), naprosyn (Alleve) or ibuprofen (Advil) as needed. °2. Take your usually prescribed medications unless otherwise directed °3. If you need a refill on your pain medication, please contact your pharmacy.  They will contact our office to request authorization.  Prescriptions will not be filled after 5pm or on week-ends. °4. You should eat very light the first 24 hours after surgery, such as soup, crackers, pudding, etc.  Resume your normal diet the day after surgery. °5. Most patients will experience some swelling and bruising in the breast.  Ice packs and a good support bra will help.  Wear the breast binder provided or a sports bra for 72 hours day and night.  After that wear a sports bra during the day until you return to the office. Swelling and bruising can take several days to resolve.  °6. It is common to experience some constipation if taking pain medication after surgery.  Increasing fluid intake and taking a stool softener will usually help or prevent this problem from occurring.  A mild laxative (Milk of Magnesia or Miralax) should be taken according to package directions if there are no bowel movements after 48 hours. °7. Unless discharge instructions indicate otherwise, you may remove your bandages 48 hours after surgery and you may shower at that time.  You may have steri-strips (small skin tapes) in place directly over the incision.  These strips should be left on the  skin for 7-10 days and will come off on their own.  If your surgeon used skin glue on the incision, you may shower in 24 hours.  The glue will flake off over the next 2-3 weeks.  Any sutures or staples will be removed at the office during your follow-up visit. °8. ACTIVITIES:  You may resume regular daily activities (gradually increasing) beginning the next day.  Wearing a good support bra or sports bra minimizes pain and swelling.  You may have sexual intercourse when it is comfortable. °a. You may drive when you no longer are taking prescription pain medication, you can comfortably wear a seatbelt, and you can safely maneuver your car and apply brakes. °b. RETURN TO WORK:  ______________________________________________________________________________________ °9. You should see your doctor in the office for a follow-up appointment approximately two weeks after your surgery.  Your doctor’s nurse will typically make your follow-up appointment when she calls you with your pathology report.  Expect your pathology report 3-4 business days after your surgery.  You may call to check if you do not hear from us after three days. °10. OTHER INSTRUCTIONS: _______________________________________________________________________________________________ _____________________________________________________________________________________________________________________________________ °_____________________________________________________________________________________________________________________________________ °_____________________________________________________________________________________________________________________________________ ° °WHEN TO CALL DR Reilly Molchan: °1. Fever over 101.0 °2. Nausea and/or vomiting. °3. Extreme swelling or bruising. °4. Continued bleeding from incision. °5. Increased pain, redness, or drainage from the incision. ° °The clinic staff is available to answer your questions during regular  business hours.  Please don’t hesitate to call and ask to speak to one of the nurses for clinical concerns.  If you   have a medical emergency, go to the nearest emergency room or call 911.  A surgeon from [facility] is always on call at the hospital. ° °For further questions, please visit [email or web address] ° °

## 2014-07-21 NOTE — H&P (Signed)
Jamie Dillon who has fh in sister at age 64 who underwent screening mm with density B. Right breast mm showed a 6 mm mass present and US showed a 70mm mass at 8 oclock 2 cm from the nipple. She has no complaints referable to either breast. no mass or discharge. biopsy was performed showing a grade II IDC that is 98% er positive, 7% pr positive, her 2 not amplified and Ki is 22%. Her BP has been elevated lately prior to this diagnosis.   Other Problems Francee Nodal, RN; 06/25/2014 9:13 AM) Anxiety Disorder High blood pressure Hypercholesterolemia Oophorectomy Bilateral. Transfusion history  Past Surgical History Francee Nodal, RN; 06/25/2014 9:13 AM) Appendectomy Breast Biopsy Right. Hysterectomy (not due to cancer) - Complete Oral Surgery  Diagnostic Studies History Francee Nodal, RN; 06/25/2014 9:13 AM) Colonoscopy 1-5 years ago Mammogram within last year Pap Smear >5 years ago  Social History Francee Nodal, RN; 06/25/2014 9:13 AM) Alcohol use Occasional alcohol use. Caffeine use Coffee. No drug use Tobacco use Former smoker.  Family History Francee Nodal, RN; 06/25/2014 9:13 AM) Arthritis Sister. Breast Cancer Sister. Diabetes Mellitus Family Members In General. Heart Disease Family Members In General, Sister. Heart disease in female family member before age 5 Hypertension Father, Mother. Kidney Disease Father. Melanoma Daughter. Thyroid problems Mother.  Pregnancy / Birth History Francee Nodal, RN; 06/25/2014 9:13 AM) Age at menarche 25 years. Age of menopause <43 Contraceptive History Oral contraceptives. Gravida 3 Irregular periods Maternal age 42-30 Para 2  Review of Systems Francee Nodal RN; 06/25/2014 9:13 AM) General Present- Fatigue. Not Present- Appetite Loss, Chills, Fever, Night Sweats, Weight Gain and Weight Loss. Skin Present- Dryness. Not Present- Change in Wart/Mole, Hives, Jaundice, New Lesions, Non-Healing Wounds,  Rash and Ulcer. HEENT Present- Earache, Nose Bleed and Wears glasses/contact lenses. Not Present- Hearing Loss, Hoarseness, Oral Ulcers, Ringing in the Ears, Seasonal Allergies, Sinus Pain, Sore Throat, Visual Disturbances and Yellow Eyes. Respiratory Present- Snoring. Not Present- Bloody sputum, Chronic Cough, Difficulty Breathing and Wheezing. Breast Not Present- Breast Mass, Breast Pain, Nipple Discharge and Skin Changes. Cardiovascular Present- Palpitations and Rapid Heart Rate. Not Present- Chest Pain, Difficulty Breathing Lying Down, Leg Cramps, Shortness of Breath and Swelling of Extremities. Gastrointestinal Not Present- Abdominal Pain, Bloating, Bloody Stool, Change in Bowel Habits, Chronic diarrhea, Constipation, Difficulty Swallowing, Excessive gas, Gets full quickly at meals, Hemorrhoids, Indigestion, Nausea, Rectal Pain and Vomiting. Female Genitourinary Present- Frequency. Not Present- Nocturia, Painful Urination, Pelvic Pain and Urgency. Musculoskeletal Not Present- Back Pain, Joint Pain, Joint Stiffness, Muscle Pain, Muscle Weakness and Swelling of Extremities. Neurological Present- Numbness. Not Present- Decreased Memory, Fainting, Headaches, Seizures, Tingling, Tremor, Trouble walking and Weakness. Psychiatric Present- Anxiety and Fearful. Not Present- Bipolar, Change in Sleep Pattern, Depression and Frequent crying. Hematology Not Present- Easy Bruising, Excessive bleeding, Gland problems, HIV and Persistent Infections.   Physical Exam Rolm Bookbinder MD; 06/25/2014 8:35 PM) General Mental Status-Alert. Orientation-Oriented X3.  Chest and Lung Exam Chest and lung exam reveals -on auscultation, normal breath sounds, no adventitious sounds and normal vocal resonance.  Breast Nipples-No Discharge. Breast Lump-No Palpable Breast Mass.  Cardiovascular Cardiovascular examination reveals -normal heart sounds, regular rate and rhythm with no  murmurs.  Lymphatic Head & Neck  General Head & Neck Lymphatics: Bilateral - Description - Normal. Axillary  General Axillary Region: Bilateral - Description - Normal. Note: no [place] adenopathy     Assessment & Plan Rolm Bookbinder MD; 06/25/2014 8:38 PM) STAGE I BREAST CANCER, RIGHT (174.9  C50.911)  Story: Right breast radioactive seed guided lumpectomy, right axillary sentinel node biopsy We discussed the staging and pathophysiology of breast cancer. We discussed all of the different options for treatment for breast cancer including surgery, chemotherapy, radiation therapy, Herceptin, and antiestrogen therapy. We discussed a sentinel lymph node biopsy as she does not appear to having lymph node involvement right now. We discussed the performance of that with injection of radioactive tracer. We discussed that she would have an incision underneath her axillary hairline. We discussed that there is a chance of having a positive node with a sentinel lymph node biopsy and we will await the permanent pathology to make any other first further decisions in terms of her treatment. One of these options might be to return to the operating room to perform an axillary lymph node dissection. We discussed up to a 5% risk lifetime of chronic shoulder pain as well as lymphedema associated with a sentinel lymph node biopsy. We discussed the options for treatment of the breast cancer which included lumpectomy versus a mastectomy. We discussed the performance of the lumpectomy with seed placement. We discussed a 5% chance of a positive margin requiring reexcision in the operating room. We also discussed that she will need radiation therapy if she undergoes lumpectomy. We discussed a mastectomy and the postoperative care for that as well. We discussed that there is no difference in her survival whether she undergoes lumpectomy with radiation therapy versus a mastectomy. We discussed the risks of operation including  bleeding, infection, possible reoperation. She understands her further therapy will be based on what her stages at the time of her operation.

## 2014-07-21 NOTE — Op Note (Signed)
Preoperative diagnosis: Clinical stage I right breast cancer Postoperative diagnosis: Same as above Procedure: #1 right breast wire-guided lumpectomy #2 right axillary sentinel lymph node biopsy Surgeon: Dr. Serita Grammes Anesthesia: Gen. With pectoral block Estimated blood loss: Minimal Specimen: #1 right breast tissue marked with paint #2 right axillary tissue #3 right axilla sentinel node with counts of 706 Applications: None Drains: None Sponge and needle count was correct at completion Disposition to recovery stable  Indications: This is a 64 year old female with a screening mammogram with a finding of a conical stage I right breast cancer. We discussed all of her options and decided to proceed with breast conservation therapy.  Procedure: The patient first had a wire placed in the lesion. She was then brought to the operating room. She had technetium injected in the standard periareolar fashion. She then was given cefazolin. Sequential compression devices were on her legs. She was then placed under general anesthesia without complication. Her right breast and axilla were then prepped and draped in the standard sterile surgical fashion. A surgical timeout was then performed.  I made a periareolar incision. I then brought the wire and from remotely. Then I used cautery to excise the wire and the surrounding tissue and an attempt to get a clear margin. Once I removed this I obtained hemostasis. Clips were placed all around the cavity. I then did a mammogram which confirmed removal of the clip, mass, and the wire. I then closed this with 2-0 Vicryl, 3-0 Vicryl, and 5-0 Monocryl. Glue was placed over the incision.  I then located the sentinel node with the neoprobe. I made an incision below the axillary hairline. I carried this through the axillary fascia. She had a couple of what felt like small nodes that I excised as non-sentinel node tissue. I then located the sentinel node with a count  as listed above. There was no background activity. There are no other identifiable nodes. I obtained hemostasis. I closed this with 2-0 Vicryl, 3-0 Vicryl and 4-0 Monocryl. Glue was placed over this incision. A breast binder was placed. She tolerated this well and was transferred recovery.

## 2014-07-22 ENCOUNTER — Encounter (HOSPITAL_COMMUNITY): Payer: Self-pay | Admitting: General Surgery

## 2014-07-22 ENCOUNTER — Encounter: Payer: Self-pay | Admitting: Family Medicine

## 2014-07-23 ENCOUNTER — Ambulatory Visit: Payer: Self-pay | Admitting: Oncology

## 2014-07-28 ENCOUNTER — Encounter: Payer: Self-pay | Admitting: *Deleted

## 2014-07-28 NOTE — Progress Notes (Unsigned)
No oncotype ordered per Dr. Jana Hakim.

## 2014-07-29 ENCOUNTER — Telehealth: Payer: Self-pay | Admitting: *Deleted

## 2014-07-29 ENCOUNTER — Ambulatory Visit: Payer: Self-pay | Admitting: Family Medicine

## 2014-07-29 NOTE — Telephone Encounter (Signed)
Scheduled and confirmed new appt with Dr. Jana Hakim on 07/31/14 at [place].

## 2014-07-31 ENCOUNTER — Ambulatory Visit (HOSPITAL_BASED_OUTPATIENT_CLINIC_OR_DEPARTMENT_OTHER): Payer: Medicare Other | Admitting: Oncology

## 2014-07-31 VITALS — BP 125/72 | HR 88 | Temp 98.4°F | Resp 18 | Ht 61.0 in | Wt 131.8 lb

## 2014-07-31 DIAGNOSIS — C50511 Malignant neoplasm of lower-outer quadrant of right female breast: Secondary | ICD-10-CM

## 2014-07-31 DIAGNOSIS — I10 Essential (primary) hypertension: Secondary | ICD-10-CM

## 2014-07-31 DIAGNOSIS — Z87891 Personal history of nicotine dependence: Secondary | ICD-10-CM

## 2014-07-31 DIAGNOSIS — Z170 Estrogen receptor positive status [ER+]: Secondary | ICD-10-CM

## 2014-07-31 NOTE — Progress Notes (Signed)
[place]  [phone] Fax:(336) [phone]     ID: Jamie Dillon DOB: 1951-02-03  MR#: [id number]  WVP#:[id number]  Patient Care Team: Abner Greenspan, MD as PCP - General Rolm Bookbinder, MD as Consulting Physician (General Surgery) Chauncey Cruel, MD as Consulting Physician (Oncology) Eppie Gibson, MD as Attending Physician (Radiation Oncology) Rockwell Germany, RN as Registered Nurse Mauro Kaufmann, RN as Registered Nurse Holley Bouche, NP as Nurse Practitioner (Nurse Practitioner) OTHER MD:  CHIEF COMPLAINT:  Estrogen receptor positive breast cancer  CURRENT TREATMENT: Awaiting adjuvant chemotherapy   BREAST CANCER HISTORY: From the original intake note:  Jamie Dillon had routine screening mammography at [facility] 06/05/2014. The breast density was category B. There was a 6 mm irregular mass in the right breast. On 06/11/2014 the patient underwent right breast ultrasonography at [facility]. This confirmed a 7 mm all her than wide irregular mass in the right blast at the 8:00 position. 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  The patient's subsequent history is as detailed below  INTERVAL HISTORY: Jamie Dillon returns today for follow-up of her breast cancer accompanied by her husband Jamie Dillon. Since her last visit here she underwent right lumpectomy and sentinel lymph node sampling 07/21/2014, with the final pathology ([id number]) showing an invasive ductal carcinoma measuring 8 mm, grade 1, with negative margins. There were 4 nodes removed one of which had a 2 mm metastatic deposit. Accordingly the Oncotype was canceled. The patient is here today to discuss systemic therapy  options.  REVIEW OF SYSTEMS: She did well with the surgery, with minimal pain, no fever, no bleeding, and no other complications. She has mild sinus problems and is anxious and perhaps a little depressed, she says. She is starting a walking program with her husband. She is very concerned about weight gain. A detailed review of systems today was otherwise entirely benign.  PAST MEDICAL HISTORY: Past Medical History  Diagnosis Date  . Allergic rhinitis   . HTN (hypertension)   . HLD (hyperlipidemia)   . Tobacco abuse   . Family history of malignant neoplasm of breast   . Family history of other kidney diseases   . Disorder of bone and cartilage, unspecified   . Breast cancer of lower-outer quadrant of right female breast 06/19/2014  . Anxiety   . PONV (postoperative nausea and vomiting)     PAST SURGICAL HISTORY: Past Surgical History  Procedure Laterality Date  . Total vaginal hysterectomy  1996    endometriosis  . Abdominal hysterectomy    . Appendectomy    . Colonoscopy    . Dilation and curettage of uterus    . Breast lumpectomy with needle localization and axillary sentinel lymph node bx Right 07/21/2014    Procedure: BREAST LUMPECTOMY WITH NEEDLE LOCALIZATION AND AXILLARY SENTINEL LYMPH NODE BIOPSY;  Surgeon: Rolm Bookbinder, MD;  Location: [facility];  Service: General;  Laterality: Right;    FAMILY HISTORY Family History  Problem Relation Age of Onset  . Breast cancer Sister   . Hypertension Mother   . Kidney disease Father   . Diabetes Paternal Aunt    the patient's father died from kidney failure at the age of 48. The patient's mother is still living at age  46. The patient had no brothers, one sister. That sister was diagnosed with breast cancer at age 72. The patient's father's mother was diagnosed with mouth cancer at the age of 42.  GYNECOLOGIC HISTORY:  No LMP recorded. Patient has had a hysterectomy. Menarche age 47, first live birth age 25. The patient is GX P2. She  had a total abdominal hysterectomy with bilateral salpingo-oophorectomy in 1996. She took hormone replacement for approximately 10 years, until 2009. She also took oral contraceptives for approximately 7 years remotely, with no complications  SOCIAL HISTORY:  The patient is currently retired, though she still works part-time at the [employer] Her husband "Jamie Dillon" [place] Carmack is retired from Press photographer. Son Jamie Dillon "Jamie Dillon" Luckey teaches English at [employer] in [place]. Daughter Jamie Dillon lives in [place] . She works as a Marine scientist at the [employer]    Runnels: In place   HEALTH MAINTENANCE: History  Substance Use Topics  . Smoking status: Former Smoker -- 1.00 packs/day    Types: Cigarettes    Quit date: 06/25/2010  . Smokeless tobacco: Not on file  . Alcohol Use: 0.0 oz/week    0 Standard drinks or equivalent per week     Comment: occasional wine     Colonoscopy: 2010  PAP:  Bone density: December 2015  Lipid panel:  No Known Allergies  Current Outpatient Prescriptions  Medication Sig Dispense Refill  . ALPRAZolam (XANAX) 0.5 MG tablet Take 1 tablet (0.5 mg total) by mouth daily as needed for anxiety. 30 tablet 1  . atorvastatin (LIPITOR) 10 MG tablet Take 1 tablet (10 mg total) by mouth daily. (Patient taking differently: Take 10 mg by mouth daily at 6 PM. ) 30 tablet 11  . b complex vitamins tablet Take 1 tablet by mouth daily.     . Calcium Carbonate-Vitamin D (CALCIUM-VITAMIN D) 500-200 MG-UNIT per tablet Take 3 tablets by mouth every morning.      . cetirizine (ZYRTEC) 10 MG tablet Take 10 mg by mouth daily as needed for allergies.     . diphenhydrAMINE (BENADRYL) 25 MG tablet Take 25 mg by mouth daily as needed for allergies.     . fluticasone (FLONASE) 50 MCG/ACT nasal spray Place 2 sprays into both nostrils daily. (Patient taking differently: Place 2 sprays into both nostrils daily as needed for allergies. ) 16 g 6   . lisinopril-hydrochlorothiazide (PRINZIDE,ZESTORETIC) 20-25 MG per tablet Take 1 tablet by mouth daily. 30 tablet 11  . metoprolol succinate (TOPROL-XL) 50 MG 24 hr tablet Take 1 tablet (50 mg total) by mouth daily. Take with or immediately following a meal. 30 tablet 11  . Multiple Vitamin (MULTIVITAMIN) tablet Take 1 tablet by mouth daily.      Marland Kitchen oxyCODONE-acetaminophen (PERCOCET) 10-325 MG per tablet Take 1 tablet by mouth every 6 (six) hours as needed for pain. 20 tablet 0   No current facility-administered medications for this visit.    OBJECTIVE: Middle-aged white woman who appears well Filed Vitals:   07/31/14 1639  BP: 125/72  Pulse: 88  Temp: 98.4 F (36.9 C)  Resp: 18     Body mass index is 24.92 kg/(m^2).    ECOG FS:0 - Asymptomatic  Sclerae unicteric, pupils equal and reactive Oropharynx clear and moist No cervical or supraclavicular adenopathy Lungs no rales or rhonchi Heart regular rate and rhythm Abd soft, nontender, positive bowel sounds MSK no focal spinal tenderness, no upper extremity lymphedema, good range of motion right  upper extremity Neuro: nonfocal, well oriented, appropriate affect Breasts: The right breast is status post recent lumpectomy and sentinel lymph node sampling. The cosmetic result is excellent. There is no dehiscence, erythema, or swelling. The right axilla is benign. Left breast is unremarkable   LAB RESULTS:  CMP     Component Value Date/Time   NA 138 07/15/2014 0927   NA 142 06/25/2014 1206   K 4.3 07/15/2014 0927   K 4.2 06/25/2014 1206   CL 103 07/15/2014 0927   CO2 30 07/15/2014 0927   CO2 26 06/25/2014 1206   GLUCOSE 100* 07/15/2014 0927   GLUCOSE 111 06/25/2014 1206   BUN 19 07/15/2014 0927   BUN 9.8 06/25/2014 1206   CREATININE 0.86 07/15/2014 0927   CREATININE 0.9 06/25/2014 1206   CALCIUM 10.0 07/15/2014 0927   CALCIUM 9.8 06/25/2014 1206   PROT 7.7 06/25/2014 1206   PROT 7.2 04/21/2014 1039   ALBUMIN 4.2  06/25/2014 1206   ALBUMIN 4.1 04/21/2014 1039   AST 20 06/25/2014 1206   AST 26 04/21/2014 1039   ALT 22 06/25/2014 1206   ALT 29 04/21/2014 1039   ALKPHOS 90 06/25/2014 1206   ALKPHOS 73 04/21/2014 1039   BILITOT 0.37 06/25/2014 1206   BILITOT 0.9 04/21/2014 1039   GFRNONAA 76.42 02/23/2010 1053   GFRAA 83 02/13/2008 1027    INo results found for: SPEP, UPEP  Lab Results  Component Value Date   WBC 6.5 07/21/2014   NEUTROABS 5.4 06/25/2014   HGB 13.4 07/21/2014   HCT 38.9 07/21/2014   MCV 91.7 07/21/2014   PLT 257 07/21/2014      Chemistry      Component Value Date/Time   NA 138 07/15/2014 0927   NA 142 06/25/2014 1206   K 4.3 07/15/2014 0927   K 4.2 06/25/2014 1206   CL 103 07/15/2014 0927   CO2 30 07/15/2014 0927   CO2 26 06/25/2014 1206   BUN 19 07/15/2014 0927   BUN 9.8 06/25/2014 1206   CREATININE 0.86 07/15/2014 0927   CREATININE 0.9 06/25/2014 1206      Component Value Date/Time   CALCIUM 10.0 07/15/2014 0927   CALCIUM 9.8 06/25/2014 1206   ALKPHOS 90 06/25/2014 1206   ALKPHOS 73 04/21/2014 1039   AST 20 06/25/2014 1206   AST 26 04/21/2014 1039   ALT 22 06/25/2014 1206   ALT 29 04/21/2014 1039   BILITOT 0.37 06/25/2014 1206   BILITOT 0.9 04/21/2014 1039       No results found for: LABCA2  No components found for: LABCA125  No results for input(s): INR in the last 168 hours.  Urinalysis    Component Value Date/Time   COLORURINE yellow 02/13/2008 0911   APPEARANCEUR Clear 02/13/2008 0911   LABSPEC <1.005 02/13/2008 0911   PHURINE 7.5 02/13/2008 0911   HGBUR negative 02/13/2008 0911   BILIRUBINUR negative 02/13/2008 0911   UROBILINOGEN 0.2 02/13/2008 0911   NITRITE negative 02/13/2008 0911    STUDIES: Nm Sentinel Node Inj-no Rpt (breast)  07/21/2014   CLINICAL DATA: right axillary sentinel node biopsy   Sulfur colloid was injected intradermally by the nuclear medicine  technologist for breast cancer sentinel node localization.       ASSESSMENT: 64 y.o. [place] woman status post right breast biopsy 06/17/2014 for a clinical T1b N0, stage I invasive ductal carcinoma, grade 2, estrogen receptor strongly positive, progesterone receptor moderately positive, with no HER-2 amplification and an MIB-1 of 22%.  (1) status post right  lumpectomy and right axillary sentinel lymph node sampling 07/21/2014 for a pT1b pN1a, stage IIA invasive ductal carcinoma, grade 1, with negative margins, repeat HER-2/neu again negative.  (2) adjuvant chemotherapy will consist of cyclophosphamide and docetaxel given every 3 weeks with Neulasta support, target start date 09/10/2014  (3) the patient will require adjuvant radiation therapy  (4)anti-estrogens to follow radiation   PLAN: I spent a little over an hour with Jamie Dillon and Jamie Dillon today going over her situation. They understand that this metastatic deposit in one of 4 lymph nodes was very small, really at the upper cutoff for a micrometastasis. This does upstage her however and taken with her tumor being grade 1 or 2 and the Ki67 greater than 20, this types out most likely as a luminal B. Accordingly it is appropriate to consider chemotherapy.  I quoted her a risk of recurrence off 24% with local treatment only. Anti-estrogens will cut this in half, to 12%. Chemotherapy will cut the residual risk by one third. This means the absolute benefit of chemotherapy will be marginal, about 4%.  We went into this in great detail so she has a very good understanding of her situation. She understands that out of 100 women like her, if all 100 women receive chemotherapy, 76 were ready "cured" with surgery and radiation, and an additional 12 were "cured" with anti-estrogens. Finally an additional 8 women would have the cancer come back despite receiving chemotherapy. In short 52 women like her would get no benefit from chemotherapy.  We then discussed the possible toxicities, side effects and complications  of docetaxel and cyclophosphamide, which would be the treatment I would propose for her.  After this discussion, Jamie Dillon was clear that she is ready to invest 3 months of her life with chemotherapy for the sake of a lower chance of recurrence long-term. Jamie Dillon is very much in favor of this as well.  Accordingly we are proceeding with Cytoxan and Taxotere to be given every 3 weeks 4, with Neulasta support. She will need a port. They're going to be under cruise between April 15 and 24th, and she would prefer not to have the port placed prior to they're being on the [place]. If the port Kami placed the Monday after their return, 09/08/2014, we could start chemotherapy later that same week.  Jamie Dillon has a good understanding of the overall plan. She agrees with it. She knows the goal of treatment in her case is cure. She will call with any problems that may develop before her next visit here, which will be the last week in April.  Chauncey Cruel, MD   07/31/2014 4:59 PM Medical Oncology and Hematology [facility] [address] [place], [place] [address] Tel. [phone]    Fax. [phone]

## 2014-08-01 ENCOUNTER — Telehealth: Payer: Self-pay | Admitting: Oncology

## 2014-08-01 ENCOUNTER — Encounter: Payer: Self-pay | Admitting: *Deleted

## 2014-08-01 NOTE — Telephone Encounter (Signed)
per pof to sch pt appt-cld pt & left message of time & date of appt

## 2014-08-03 ENCOUNTER — Other Ambulatory Visit: Payer: Self-pay | Admitting: Oncology

## 2014-08-04 ENCOUNTER — Telehealth: Payer: Self-pay | Admitting: Oncology

## 2014-08-04 ENCOUNTER — Ambulatory Visit: Payer: Self-pay | Admitting: Oncology

## 2014-08-04 NOTE — Telephone Encounter (Signed)
Spoke with patient and she is aware of her chemo class and her lab/chemo appointments

## 2014-08-11 ENCOUNTER — Other Ambulatory Visit: Payer: Medicare Other

## 2014-08-11 ENCOUNTER — Encounter: Payer: Self-pay | Admitting: General Practice

## 2014-08-11 NOTE — Progress Notes (Signed)
Acquainted with Clay and her husband from [facility] and saw them again in chemo class this morning, providing pastoral presence and reflective listening as they processed feelings and nervousness related to dx/tx.  Pointed out several support resources/programs that may be of interest, given their concerns, including "Look Good, Feel Better."  Ilyse shared that she is already registered for "After Breast Cancer" class and that physical movement is helping her arm's range of motion, though it is still numb.  Couple is looking forward to taking an already-scheduled cruise next month to celebrate their 40th anniversary, and we explored other hopes and plans they have for future travel, which is a significant source of meaning and learning for them.  They are aware and appreciative of ongoing availability of [place] team.  Jovita Kussmaul [place], [place]

## 2014-08-18 ENCOUNTER — Other Ambulatory Visit: Payer: Self-pay | Admitting: *Deleted

## 2014-08-18 MED ORDER — UNABLE TO FIND: 1.0000 | Freq: Once | Status: DC

## 2014-08-18 NOTE — Telephone Encounter (Signed)
Wip script [facility]ed to [facility] at [phone]

## 2014-09-08 ENCOUNTER — Other Ambulatory Visit: Payer: Self-pay | Admitting: *Deleted

## 2014-09-08 ENCOUNTER — Other Ambulatory Visit: Payer: Self-pay | Admitting: General Surgery

## 2014-09-08 ENCOUNTER — Encounter (HOSPITAL_COMMUNITY): Payer: Self-pay | Admitting: *Deleted

## 2014-09-08 DIAGNOSIS — C50511 Malignant neoplasm of lower-outer quadrant of right female breast: Secondary | ICD-10-CM

## 2014-09-08 NOTE — Progress Notes (Signed)
Needs bmet, but going to cancer center tomorrow for full labs -ekg 8/15-

## 2014-09-09 ENCOUNTER — Other Ambulatory Visit (HOSPITAL_BASED_OUTPATIENT_CLINIC_OR_DEPARTMENT_OTHER): Payer: Medicare Other

## 2014-09-09 ENCOUNTER — Encounter: Payer: Self-pay | Admitting: *Deleted

## 2014-09-09 ENCOUNTER — Ambulatory Visit (HOSPITAL_BASED_OUTPATIENT_CLINIC_OR_DEPARTMENT_OTHER): Payer: Medicare Other | Admitting: Oncology

## 2014-09-09 VITALS — BP 150/78 | HR 78 | Temp 98.1°F | Resp 18 | Ht 61.0 in | Wt 134.2 lb

## 2014-09-09 DIAGNOSIS — C50511 Malignant neoplasm of lower-outer quadrant of right female breast: Secondary | ICD-10-CM

## 2014-09-09 DIAGNOSIS — I10 Essential (primary) hypertension: Secondary | ICD-10-CM

## 2014-09-09 DIAGNOSIS — Z87891 Personal history of nicotine dependence: Secondary | ICD-10-CM

## 2014-09-09 DIAGNOSIS — Z170 Estrogen receptor positive status [ER+]: Secondary | ICD-10-CM

## 2014-09-09 LAB — CBC WITH DIFFERENTIAL/PLATELET
BASO%: 0.6 % (ref 0.0–2.0)
Basophils Absolute: 0.1 10*3/uL (ref 0.0–0.1)
EOS%: 1.7 % (ref 0.0–7.0)
Eosinophils Absolute: 0.1 10*3/uL (ref 0.0–0.5)
HCT: 37 % (ref 34.8–46.6)
HGB: 13 g/dL (ref 11.6–15.9)
LYMPH%: 31.5 % (ref 14.0–49.7)
MCH: 32.7 pg (ref 25.1–34.0)
MCHC: 35.1 g/dL (ref 31.5–36.0)
MCV: 93 fL (ref 79.5–101.0)
MONO#: 0.9 10*3/uL (ref 0.1–0.9)
MONO%: 11.1 % (ref 0.0–14.0)
NEUT#: 4.7 10*3/uL (ref 1.5–6.5)
NEUT%: 55.1 % (ref 38.4–76.8)
Platelets: 235 10*3/uL (ref 145–400)
RBC: 3.98 10*6/uL (ref 3.70–5.45)
RDW: 13.1 % (ref 11.2–14.5)
WBC: 8.5 10*3/uL (ref 3.9–10.3)
lymph#: 2.7 10*3/uL (ref 0.9–3.3)

## 2014-09-09 LAB — COMPREHENSIVE METABOLIC PANEL (CC13)
ALT: 25 U/L (ref 0–55)
ANION GAP: 10 meq/L (ref 3–11)
AST: 23 U/L (ref 5–34)
Albumin: 4.1 g/dL (ref 3.5–5.0)
Alkaline Phosphatase: 79 U/L (ref 40–150)
BILIRUBIN TOTAL: 0.45 mg/dL (ref 0.20–1.20)
BUN: 14.1 mg/dL (ref 7.0–26.0)
CHLORIDE: 105 meq/L (ref 98–109)
CO2: 27 meq/L (ref 22–29)
CREATININE: 0.8 mg/dL (ref 0.6–1.1)
Calcium: 10.2 mg/dL (ref 8.4–10.4)
EGFR: 78 mL/min/{1.73_m2} — ABNORMAL LOW (ref >90)
Glucose: 80 mg/dl (ref 70–140)
Potassium: 3.8 mEq/L (ref 3.5–5.1)
Sodium: 142 mEq/L (ref 136–145)
Total Protein: 7.2 g/dL (ref 6.4–8.3)

## 2014-09-09 MED ORDER — LIDOCAINE-PRILOCAINE 2.5-2.5 % EX CREA: TOPICAL_CREAM | CUTANEOUS | Status: DC

## 2014-09-09 MED ORDER — TOBRAMYCIN-DEXAMETHASONE 0.3-0.1 % OP SUSP: 1.0000 [drp] | Freq: Two times a day (BID) | OPHTHALMIC | Status: DC

## 2014-09-09 MED ORDER — PROCHLORPERAZINE MALEATE 10 MG PO TABS: ORAL_TABLET | ORAL | Status: DC

## 2014-09-09 MED ORDER — LORAZEPAM 0.5 MG PO TABS: 0.5000 mg | ORAL_TABLET | Freq: Every evening | ORAL | Status: DC | PRN

## 2014-09-09 MED ORDER — ONDANSETRON HCL 8 MG PO TABS: ORAL_TABLET | ORAL | Status: DC

## 2014-09-09 MED ORDER — DEXAMETHASONE 4 MG PO TABS: 8.0000 mg | ORAL_TABLET | Freq: Two times a day (BID) | ORAL | Status: DC

## 2014-09-09 NOTE — Progress Notes (Signed)
[place]  [phone] [phone]) [phone]     ID: Jamie Dillon DOB: [birth year]  MR#: [id number]  IPJ#:[id number]  Patient Care Team: Abner Greenspan, MD as PCP - General Rolm Bookbinder, MD as Consulting Physician (General Surgery) Chauncey Cruel, MD as Consulting Physician (Oncology) Eppie Gibson, MD as Attending Physician (Radiation Oncology) Rockwell Germany, RN as Registered Nurse Mauro Kaufmann, RN as Registered Nurse Holley Bouche, NP as Nurse Practitioner (Nurse Practitioner) OTHER MD:  CHIEF COMPLAINT:  Estrogen receptor positive breast cancer  CURRENT TREATMENT: adjuvant chemotherapy   BREAST CANCER HISTORY: From the original intake note:  Lyssa had routine screening mammography at [facility] 06/05/2014. The breast density was category B. There was a 6 mm irregular mass in the right breast. On 06/11/2014 the patient underwent right breast ultrasonography at [facility]. This confirmed a 7 mm all her than wide irregular mass in the right blast at the 8:00 position. 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  The patient's subsequent history is as detailed below  INTERVAL HISTORY: Lamira returns today for follow-up of her breast cancer accompanied by her husband [name]. They just returned from a cruise to the [place] which they greatly enjoyed. She is scheduled for port placement tomorrow and to start chemotherapy 09/15/2014  REVIEW OF SYSTEMS: She is exercising by using the treadmill about 30 minutes almost every day. She feels understandably a little bit anxious, but aside from that a detailed review of systems today was entirely negative.  PAST MEDICAL HISTORY: Past  Medical History  Diagnosis Date  . Allergic rhinitis   . HTN (hypertension)   . HLD (hyperlipidemia)   . Tobacco abuse   . Family history of malignant neoplasm of breast   . Family history of other kidney diseases   . Disorder of bone and cartilage, unspecified   . Breast cancer of lower-outer quadrant of right female breast 06/19/2014  . Anxiety   . PONV (postoperative nausea and vomiting)     PAST SURGICAL HISTORY: Past Surgical History  Procedure Laterality Date  . Total vaginal hysterectomy  1996    endometriosis  . Abdominal hysterectomy    . Appendectomy    . Colonoscopy    . Dilation and curettage of uterus    . Breast lumpectomy with needle localization and axillary sentinel lymph node bx Right 07/21/2014    Procedure: BREAST LUMPECTOMY WITH NEEDLE LOCALIZATION AND AXILLARY SENTINEL LYMPH NODE BIOPSY;  Surgeon: Rolm Bookbinder, MD;  Location: [place];  Service: General;  Laterality: Right;    FAMILY HISTORY Family History  Problem Relation Age of Onset  . Breast cancer Sister   . Hypertension Mother   . Kidney disease Father   . Diabetes Paternal Aunt    the patient's father died from kidney failure at the age of 48. The patient's mother is still living at age 66. The patient had no brothers, one sister. That sister was diagnosed with breast cancer at age 4. The patient's father's mother was diagnosed with mouth cancer at the age of 76.  GYNECOLOGIC HISTORY:  No LMP recorded. Patient has had a hysterectomy. Menarche age 25, first live birth age 5. The patient is GX P2. She had a total abdominal hysterectomy with  bilateral salpingo-oophorectomy in 1996. She took hormone replacement for approximately 10 years, until 2009. She also took oral contraceptives for approximately 7 years remotely, with no complications  SOCIAL HISTORY:  The patient is currently retired, though she still works part-time at the [employer] Her husband "[name]" Emera Bussie is  retired from [employer]. Son [name] "[name]" Babson teaches English at [employer] in [place]. Daughter [name] lives in [place] . She works as a Marine scientist at the [employer]    [place]: In place   HEALTH MAINTENANCE: History  Substance Use Topics  . Smoking status: Former Smoker -- 1.00 packs/day    Types: Cigarettes    Quit date: 06/25/2010  . Smokeless tobacco: Not on file  . Alcohol Use: 0.0 oz/week    0 Standard drinks or equivalent per week     Comment: occasional wine     Colonoscopy: 2010  PAP:  Bone density: December 2015  Lipid panel:  No Known Allergies  Current Outpatient Prescriptions  Medication Sig Dispense Refill  . ALPRAZolam (XANAX) 0.5 MG tablet Take 1 tablet (0.5 mg total) by mouth daily as needed for anxiety. 30 tablet 1  . atorvastatin (LIPITOR) 10 MG tablet Take 1 tablet (10 mg total) by mouth daily. (Patient taking differently: Take 10 mg by mouth daily at 6 PM. ) 30 tablet 11  . b complex vitamins tablet Take 1 tablet by mouth daily.     . Calcium Carbonate-Vitamin D (CALCIUM-VITAMIN D) 500-200 MG-UNIT per tablet Take 3 tablets by mouth every morning.      . cetirizine (ZYRTEC) 10 MG tablet Take 10 mg by mouth daily as needed for allergies.     Marland Kitchen dexamethasone (DECADRON) 4 MG tablet Take 2 tablets (8 mg total) by mouth 2 (two) times daily. Start the day before Taxotere. Then again the day after chemo for 3 days. 30 tablet 1  . diphenhydrAMINE (BENADRYL) 25 MG tablet Take 25 mg by mouth daily as needed for allergies.     . fluticasone (FLONASE) 50 MCG/ACT nasal spray Place 2 sprays into both nostrils daily. (Patient taking differently: Place 2 sprays into both nostrils daily as needed for allergies. ) 16 g 6  . lidocaine-prilocaine (EMLA) cream Apply to affected area once 30 g 3  . lisinopril-hydrochlorothiazide (PRINZIDE,ZESTORETIC) 20-25 MG per tablet Take 1 tablet by mouth daily. 30 tablet 11  . LORazepam (ATIVAN) 0.5  MG tablet Take 1 tablet (0.5 mg total) by mouth at bedtime as needed for sleep. 30 tablet 0  . metoprolol succinate (TOPROL-XL) 50 MG 24 hr tablet Take 1 tablet (50 mg total) by mouth daily. Take with or immediately following a meal. 30 tablet 11  . Multiple Vitamin (MULTIVITAMIN) tablet Take 1 tablet by mouth daily.      . ondansetron (ZOFRAN) 8 MG tablet Take twice a day. Start the evening of chemo day and continue for 3 days. Then take as needed for nausea or vomiting. 30 tablet 1  . prochlorperazine (COMPAZINE) 10 MG tablet Take one tablet 4 times a day (before meals and at bedtime) starting the evening of chemo day and continuing for 3 days for nausea 30 tablet 1  . tobramycin-dexamethasone (TOBRADEX) ophthalmic solution Place 1 drop into both eyes 2 (two) times daily. 5 mL 0  . UNABLE TO FIND 1 each by Other route once. Dispense per medical necessity cranial prothesis due to alopecia induced by chemotherapy for breast cancer diagnosis 1 each 1  No current facility-administered medications for this visit.    OBJECTIVE: Middle-aged white woman in no acute distress Filed Vitals:   09/09/14 1613  BP: 150/78  Pulse: 78  Temp: 98.1 F (36.7 C)  Resp: 18     Body mass index is 25.37 kg/(m^2).    ECOG FS:0 - Asymptomatic  Sclerae unicteric, EOMs intact Oropharynx clear, dentition in good repair No cervical or supraclavicular adenopathy Lungs no rales or rhonchi Heart regular rate and rhythm Abd soft, nontender, positive bowel sounds MSK no focal spinal tenderness, no upper extremity lymphedema Neuro: nonfocal, well oriented, positive affect Breasts: Deferred  LAB RESULTS:  CMP     Component Value Date/Time   NA 142 09/09/2014 1602   NA 138 07/15/2014 0927   K 3.8 09/09/2014 1602   K 4.3 07/15/2014 0927   CL 103 07/15/2014 0927   CO2 27 09/09/2014 1602   CO2 30 07/15/2014 0927   GLUCOSE 80 09/09/2014 1602   GLUCOSE 100* 07/15/2014 0927   BUN 14.1 09/09/2014 1602   BUN 19  07/15/2014 0927   CREATININE 0.8 09/09/2014 1602   CREATININE 0.86 07/15/2014 0927   CALCIUM 10.2 09/09/2014 1602   CALCIUM 10.0 07/15/2014 0927   PROT 7.2 09/09/2014 1602   PROT 7.2 04/21/2014 1039   ALBUMIN 4.1 09/09/2014 1602   ALBUMIN 4.1 04/21/2014 1039   AST 23 09/09/2014 1602   AST 26 04/21/2014 1039   ALT 25 09/09/2014 1602   ALT 29 04/21/2014 1039   ALKPHOS 79 09/09/2014 1602   ALKPHOS 73 04/21/2014 1039   BILITOT 0.45 09/09/2014 1602   BILITOT 0.9 04/21/2014 1039   GFRNONAA 76.42 02/23/2010 1053   GFRAA 83 02/13/2008 1027    INo results found for: SPEP, UPEP  Lab Results  Component Value Date   WBC 8.5 09/09/2014   NEUTROABS 4.7 09/09/2014   HGB 13.0 09/09/2014   HCT 37.0 09/09/2014   MCV 93.0 09/09/2014   PLT 235 09/09/2014      Chemistry      Component Value Date/Time   NA 142 09/09/2014 1602   NA 138 07/15/2014 0927   K 3.8 09/09/2014 1602   K 4.3 07/15/2014 0927   CL 103 07/15/2014 0927   CO2 27 09/09/2014 1602   CO2 30 07/15/2014 0927   BUN 14.1 09/09/2014 1602   BUN 19 07/15/2014 0927   CREATININE 0.8 09/09/2014 1602   CREATININE 0.86 07/15/2014 0927      Component Value Date/Time   CALCIUM 10.2 09/09/2014 1602   CALCIUM 10.0 07/15/2014 0927   ALKPHOS 79 09/09/2014 1602   ALKPHOS 73 04/21/2014 1039   AST 23 09/09/2014 1602   AST 26 04/21/2014 1039   ALT 25 09/09/2014 1602   ALT 29 04/21/2014 1039   BILITOT 0.45 09/09/2014 1602   BILITOT 0.9 04/21/2014 1039       No results found for: LABCA2  No components found for: LABCA125  No results for input(s): INR in the last 168 hours.  Urinalysis    Component Value Date/Time   COLORURINE yellow 02/13/2008 0911   APPEARANCEUR Clear 02/13/2008 0911   LABSPEC <1.005 02/13/2008 0911   PHURINE 7.5 02/13/2008 0911   HGBUR negative 02/13/2008 0911   BILIRUBINUR negative 02/13/2008 0911   UROBILINOGEN 0.2 02/13/2008 0911   NITRITE negative 02/13/2008 0911    STUDIES: No results  found.   ASSESSMENT: 64 y.o. [place] woman status post right breast biopsy 06/17/2014 for a clinical T1b N0, stage I invasive ductal  carcinoma, grade 2, estrogen receptor strongly positive, progesterone receptor moderately positive, with no HER-2 amplification and an MIB-1 of 22%.  (1) status post right lumpectomy and right axillary sentinel lymph node sampling 07/21/2014 for a pT1b pN1a, stage IIA invasive ductal carcinoma, grade 1, with negative margins, repeat HER-2/neu again negative.  (2) adjuvant chemotherapy will consist of cyclophosphamide and docetaxel given every 3 weeks with Neulasta support, start date 09/15/2014  (3) the patient will require adjuvant radiation therapy  (4) anti-estrogens to follow radiation   PLAN: Anginette is very thoughtful and proactive. She has already bought 2 weeks. She is doing the exercises as suggested. All this tells me she will do well with chemotherapy, and that is our hope and goal.  Today I put in all her prescriptions for her supportive drugs and I gave her a "roadmap" on how to take the medications. I also went over with her on [name] on exactly how to use that roadmap.  She will have her port placed tomorrow. She will return on Monday, May 2 to start chemotherapy. She would prefer to have the onpro system rather than having to come back on day 2 for a shot.  I have urged her to call us for any problems or questions that develop a not simply wait until the next visit. We can intervene easily during the week, of course over the weekend that is more complicated.  I urged her to start drinking between 2 and 3 quarts a day on the day before chemotherapy. She understands that the day before chemotherapy she has to start her dexamethasone. I also suggested she keep a symptom diary so we can go over this at the first return visit and make any adjustments are needed.  Sibley has a good understanding of the overall plan. She agrees with it. She knows the  goal of treatment in her case is cure. She will call with any problems that may develop before her next visit here, which will be the last week in April.  Chauncey Cruel, MD   09/09/2014 5:05 PM Medical Oncology and Hematology [facility] [address] [place], [place] [address] Tel. [phone]    Fax. [phone]

## 2014-09-09 NOTE — Progress Notes (Signed)
Met with pt prior to her chemo discussion with Dr. Jana Hakim. Discussed some tips to help with symptoms during chemotherapy. Gave emotional support and encouragement.

## 2014-09-10 ENCOUNTER — Encounter (HOSPITAL_BASED_OUTPATIENT_CLINIC_OR_DEPARTMENT_OTHER): Payer: Self-pay | Admitting: Anesthesiology

## 2014-09-10 ENCOUNTER — Ambulatory Visit (HOSPITAL_BASED_OUTPATIENT_CLINIC_OR_DEPARTMENT_OTHER): Payer: Medicare Other | Admitting: Anesthesiology

## 2014-09-10 ENCOUNTER — Ambulatory Visit (HOSPITAL_COMMUNITY): Payer: Medicare Other

## 2014-09-10 ENCOUNTER — Ambulatory Visit (HOSPITAL_COMMUNITY)
Admission: RE | Admit: 2014-09-10 | Discharge: 2014-09-10 | Disposition: A | Discharge status: Home / Self Care | Payer: Medicare Other | Source: Ambulatory Visit | Attending: General Surgery | Admitting: General Surgery

## 2014-09-10 ENCOUNTER — Encounter (HOSPITAL_BASED_OUTPATIENT_CLINIC_OR_DEPARTMENT_OTHER)
Admission: RE | Disposition: A | Discharge status: Home / Self Care | Payer: Self-pay | Source: Ambulatory Visit | Attending: General Surgery

## 2014-09-10 DIAGNOSIS — I10 Essential (primary) hypertension: Secondary | ICD-10-CM | POA: Diagnosis not present

## 2014-09-10 DIAGNOSIS — F419 Anxiety disorder, unspecified: Secondary | ICD-10-CM | POA: Diagnosis not present

## 2014-09-10 DIAGNOSIS — Z95828 Presence of other vascular implants and grafts: Secondary | ICD-10-CM

## 2014-09-10 DIAGNOSIS — C50911 Malignant neoplasm of unspecified site of right female breast: Secondary | ICD-10-CM | POA: Diagnosis not present

## 2014-09-10 DIAGNOSIS — Z90710 Acquired absence of both cervix and uterus: Secondary | ICD-10-CM | POA: Insufficient documentation

## 2014-09-10 DIAGNOSIS — Z9049 Acquired absence of other specified parts of digestive tract: Secondary | ICD-10-CM | POA: Diagnosis not present

## 2014-09-10 DIAGNOSIS — Z87891 Personal history of nicotine dependence: Secondary | ICD-10-CM | POA: Diagnosis not present

## 2014-09-10 DIAGNOSIS — E780 Pure hypercholesterolemia: Secondary | ICD-10-CM | POA: Diagnosis not present

## 2014-09-10 HISTORY — PX: PORTACATH PLACEMENT: SHX2246

## 2014-09-10 SURGERY — INSERTION, TUNNELED CENTRAL VENOUS DEVICE, WITH PORT
Anesthesia: General | Site: Neck | Laterality: Right

## 2014-09-10 MED ORDER — CEFAZOLIN SODIUM-DEXTROSE 2-3 GM-% IV SOLR
INTRAVENOUS | Status: AC
  Filled 2014-09-10: qty 50

## 2014-09-10 MED ORDER — OXYCODONE-ACETAMINOPHEN 10-325 MG PO TABS: 10.0000 | ORAL_TABLET | Freq: Four times a day (QID) | ORAL | Status: DC | PRN

## 2014-09-10 MED ORDER — PROPOFOL 10 MG/ML IV BOLUS
INTRAVENOUS | Status: DC | PRN
  Administered 2014-09-10: 170 mg via INTRAVENOUS

## 2014-09-10 MED ORDER — MIDAZOLAM HCL 2 MG/2ML IJ SOLN
1.0000 mg | INTRAMUSCULAR | Status: DC | PRN
  Administered 2014-09-10: 2 mg via INTRAVENOUS

## 2014-09-10 MED ORDER — ONDANSETRON HCL 4 MG/2ML IJ SOLN
INTRAMUSCULAR | Status: DC | PRN
  Administered 2014-09-10: 4 mg via INTRAVENOUS

## 2014-09-10 MED ORDER — LIDOCAINE HCL (CARDIAC) 20 MG/ML IV SOLN
INTRAVENOUS | Status: DC | PRN
Start: 2014-09-10 — End: 2014-09-10
  Administered 2014-09-10: 70 mg via INTRAVENOUS

## 2014-09-10 MED ORDER — HEPARIN (PORCINE) IN NACL 2-0.9 UNIT/ML-% IJ SOLN
INTRAMUSCULAR | Status: AC
  Filled 2014-09-10: qty 500

## 2014-09-10 MED ORDER — FENTANYL CITRATE (PF) 100 MCG/2ML IJ SOLN: 50.0000 ug | INTRAMUSCULAR | Status: DC | PRN

## 2014-09-10 MED ORDER — CEFAZOLIN SODIUM-DEXTROSE 2-3 GM-% IV SOLR
2.0000 g | INTRAVENOUS | Status: AC
  Administered 2014-09-10: 2 g via INTRAVENOUS

## 2014-09-10 MED ORDER — GLYCOPYRROLATE 0.2 MG/ML IJ SOLN: 0.2000 mg | Freq: Once | INTRAMUSCULAR | Status: DC | PRN

## 2014-09-10 MED ORDER — MIDAZOLAM HCL 2 MG/2ML IJ SOLN
INTRAMUSCULAR | Status: AC
  Filled 2014-09-10: qty 2

## 2014-09-10 MED ORDER — HEPARIN SOD (PORK) LOCK FLUSH 100 UNIT/ML IV SOLN
INTRAVENOUS | Status: AC
  Filled 2014-09-10: qty 5

## 2014-09-10 MED ORDER — FENTANYL CITRATE (PF) 100 MCG/2ML IJ SOLN
INTRAMUSCULAR | Status: AC
  Filled 2014-09-10: qty 4

## 2014-09-10 MED ORDER — HEPARIN (PORCINE) IN NACL 2-0.9 UNIT/ML-% IJ SOLN
INTRAMUSCULAR | Status: DC | PRN
  Administered 2014-09-10: 500 mL via INTRAVENOUS

## 2014-09-10 MED ORDER — FENTANYL CITRATE (PF) 100 MCG/2ML IJ SOLN: 25.0000 ug | INTRAMUSCULAR | Status: DC | PRN

## 2014-09-10 MED ORDER — FENTANYL CITRATE (PF) 100 MCG/2ML IJ SOLN
INTRAMUSCULAR | Status: DC | PRN
  Administered 2014-09-10: 100 ug via INTRAVENOUS

## 2014-09-10 MED ORDER — BUPIVACAINE HCL (PF) 0.25 % IJ SOLN
INTRAMUSCULAR | Status: AC
  Filled 2014-09-10: qty 30

## 2014-09-10 MED ORDER — LACTATED RINGERS IV SOLN
INTRAVENOUS | Status: DC
  Administered 2014-09-10: 09:00:00 via INTRAVENOUS

## 2014-09-10 MED ORDER — OXYCODONE-ACETAMINOPHEN 10-325 MG PO TABS: 1.0000 | ORAL_TABLET | Freq: Four times a day (QID) | ORAL | Status: DC | PRN

## 2014-09-10 MED ORDER — HEPARIN SOD (PORK) LOCK FLUSH 100 UNIT/ML IV SOLN
INTRAVENOUS | Status: DC | PRN
  Administered 2014-09-10: 500 [IU] via INTRAVENOUS

## 2014-09-10 MED ORDER — EPHEDRINE SULFATE 50 MG/ML IJ SOLN
INTRAMUSCULAR | Status: DC | PRN
  Administered 2014-09-10 (×3): 10 mg via INTRAVENOUS

## 2014-09-10 MED ORDER — DEXAMETHASONE SODIUM PHOSPHATE 4 MG/ML IJ SOLN
INTRAMUSCULAR | Status: DC | PRN
  Administered 2014-09-10: 10 mg via INTRAVENOUS

## 2014-09-10 MED ORDER — CEFAZOLIN SODIUM-DEXTROSE 2-3 GM-% IV SOLR
INTRAVENOUS | Status: DC | PRN
  Administered 2014-09-10: 2 g via INTRAVENOUS

## 2014-09-10 SURGICAL SUPPLY — 55 items
APL SKNCLS STERI-STRIP NONHPOA (GAUZE/BANDAGES/DRESSINGS)
BAG DECANTER FOR FLEXI CONT (MISCELLANEOUS) ×3 IMPLANT
BENZOIN TINCTURE PRP APPL 2/3 (GAUZE/BANDAGES/DRESSINGS) ×1 IMPLANT
BLADE SURG 11 STRL SS (BLADE) ×3 IMPLANT
BLADE SURG 15 STRL LF DISP TIS (BLADE) ×1 IMPLANT
BLADE SURG 15 STRL SS (BLADE) ×3
CANISTER SUCT 1200ML W/VALVE (MISCELLANEOUS) IMPLANT
CHLORAPREP W/TINT 26ML (MISCELLANEOUS) ×3 IMPLANT
CLOSURE WOUND 1/2 X4 (GAUZE/BANDAGES/DRESSINGS)
COVER BACK TABLE 60X90IN (DRAPES) ×3 IMPLANT
COVER MAYO STAND STRL (DRAPES) ×3 IMPLANT
DECANTER SPIKE VIAL GLASS SM (MISCELLANEOUS) IMPLANT
DRAPE C-ARM 42X72 X-RAY (DRAPES) ×3 IMPLANT
DRAPE LAPAROSCOPIC ABDOMINAL (DRAPES) ×3 IMPLANT
DRAPE UTILITY XL STRL (DRAPES) ×3 IMPLANT
DRSG TEGADERM 4X4.75 (GAUZE/BANDAGES/DRESSINGS) IMPLANT
ELECT COATED BLADE 2.86 ST (ELECTRODE) ×3 IMPLANT
ELECT REM PT RETURN 9FT ADLT (ELECTROSURGICAL) ×3
ELECTRODE REM PT RTRN 9FT ADLT (ELECTROSURGICAL) ×1 IMPLANT
GLOVE BIO SURGEON STRL SZ7 (GLOVE) ×3 IMPLANT
GLOVE BIOGEL PI IND STRL 6.5 (GLOVE) IMPLANT
GLOVE BIOGEL PI IND STRL 7.5 (GLOVE) ×1 IMPLANT
GLOVE BIOGEL PI INDICATOR 6.5 (GLOVE) ×2
GLOVE BIOGEL PI INDICATOR 7.5 (GLOVE) ×2
GLOVE ECLIPSE 6.5 STRL STRAW (GLOVE) ×2 IMPLANT
GLOVE EXAM NITRILE EXT CUFF MD (GLOVE) ×2 IMPLANT
GOWN STRL REUS W/ TWL LRG LVL3 (GOWN DISPOSABLE) ×4 IMPLANT
GOWN STRL REUS W/TWL LRG LVL3 (GOWN DISPOSABLE) ×6
IV KIT MINILOC 20X1 SAFETY (NEEDLE) IMPLANT
KIT PORT POWER 8FR ISP CVUE (Catheter) ×2 IMPLANT
LIQUID BAND (GAUZE/BANDAGES/DRESSINGS) ×3 IMPLANT
MARKER SKIN DUAL TIP RULER LAB (MISCELLANEOUS) ×3 IMPLANT
NDL HYPO 25X1 1.5 SAFETY (NEEDLE) ×1 IMPLANT
NDL SAFETY ECLIPSE 18X1.5 (NEEDLE) IMPLANT
NEEDLE HYPO 18GX1.5 SHARP (NEEDLE)
NEEDLE HYPO 25X1 1.5 SAFETY (NEEDLE) ×3 IMPLANT
PACK BASIN DAY SURGERY FS (CUSTOM PROCEDURE TRAY) ×3 IMPLANT
PENCIL BUTTON HOLSTER BLD 10FT (ELECTRODE) ×3 IMPLANT
SLEEVE SCD COMPRESS KNEE MED (MISCELLANEOUS) ×3 IMPLANT
SPONGE GAUZE 4X4 12PLY STER LF (GAUZE/BANDAGES/DRESSINGS) ×1 IMPLANT
STAPLER VISISTAT 35W (STAPLE) ×3 IMPLANT
STRIP CLOSURE SKIN 1/2X4 (GAUZE/BANDAGES/DRESSINGS) ×1 IMPLANT
SUT MON AB 4-0 PC3 18 (SUTURE) ×3 IMPLANT
SUT PROLENE 2 0 SH DA (SUTURE) ×3 IMPLANT
SUT SILK 2 0 TIES 17X18 (SUTURE)
SUT SILK 2-0 18XBRD TIE BLK (SUTURE) IMPLANT
SUT VIC AB 3-0 SH 27 (SUTURE) ×3
SUT VIC AB 3-0 SH 27X BRD (SUTURE) ×1 IMPLANT
SYR 5ML LUER SLIP (SYRINGE) ×3 IMPLANT
SYR CONTROL 10ML LL (SYRINGE) ×3 IMPLANT
TOWEL OR 17X24 6PK STRL BLUE (TOWEL DISPOSABLE) ×5 IMPLANT
TOWEL OR NON WOVEN STRL DISP B (DISPOSABLE) ×1 IMPLANT
TUBE CONNECTING 20'X1/4 (TUBING)
TUBE CONNECTING 20X1/4 (TUBING) IMPLANT
YANKAUER SUCT BULB TIP NO VENT (SUCTIONS) IMPLANT

## 2014-09-10 NOTE — Anesthesia Preprocedure Evaluation (Addendum)
Anesthesia Evaluation  Patient identified by MRN, date of birth, ID band  Reviewed: Allergy & Precautions, NPO status , Patient's Chart, lab work & pertinent test results  History of Anesthesia Complications (+) PONV  Airway Mallampati: II  TM Distance: >3 FB Neck ROM: Full    Dental   Pulmonary former smoker,  breath sounds clear to auscultation        Cardiovascular hypertension, Rhythm:Regular Rate:Normal     Neuro/Psych    GI/Hepatic negative GI ROS, Neg liver ROS,   Endo/Other  negative endocrine ROS  Renal/GU negative Renal ROS     Musculoskeletal   Abdominal   Peds  Hematology   Anesthesia Other Findings   Reproductive/Obstetrics                            Anesthesia Physical Anesthesia Plan  ASA: III  Anesthesia Plan: General   Post-op Pain Management:    Induction: Intravenous  Airway Management Planned: LMA  Additional Equipment:   Intra-op Plan:   Post-operative Plan: Extubation in OR  Informed Consent: I have reviewed the patients History and Physical, chart, labs and discussed the procedure including the risks, benefits and alternatives for the proposed anesthesia with the patient or authorized representative who has indicated his/her understanding and acceptance.   Dental advisory given  Plan Discussed with: CRNA, Anesthesiologist and Surgeon  Anesthesia Plan Comments:         Anesthesia Quick Evaluation

## 2014-09-10 NOTE — Interval H&P Note (Signed)
History and Physical Interval Note:  09/10/2014 9:16 AM  Jamie Dillon  has presented today for surgery, with the diagnosis of BREAST CANCER  The various methods of treatment have been discussed with the patient and family. After consideration of risks, benefits and other options for treatment, the patient has consented to  Procedure(s): INSERTION PORT-A-CATH (N/A) as a surgical intervention .  The patient's history has been reviewed, patient examined, no change in status, stable for surgery.  I have reviewed the patient's chart and labs.  Questions were answered to the patient's satisfaction.     Mabelle Mungin

## 2014-09-10 NOTE — H&P (Signed)
   45 yof s/p right lumpectomy/sn biopsy returns today with complaint of tightness in her right axilla. Path shows an idc, pos for LVI, margins clear, one out of two nodes positive for carcinoma. this is a 2 mm deposit. the tumor is 8 mm in size. she is here with her husband today to discuss.   Other Problems  Anxiety Disorder Breast Cancer High blood pressure Hypercholesterolemia  Past Surgical History  Appendectomy Breast Mass; Local Excision Right. Hysterectomy (not due to cancer) - Complete  Allergies  No Known Drug Allergies03/15/2016  Medication History  Atorvastatin Calcium (10MG  Tablet, Oral) Active. ALPRAZolam (0.5MG  Tablet, Oral) Active. Lisinopril-Hydrochlorothiazide (20-25MG  Tablet, Oral) Active. Metoprolol Succinate ER (50MG  Tablet ER 24HR, Oral) Active. Cetirizine HCl (10MG  Tablet, Oral) Active. Benadryl Allergy (25MG  Capsule, Oral) Active. Oxycodone-Acetaminophen (10-325MG  Tablet, Oral) Active. Medications Reconciled  Social History  Alcohol use Occasional alcohol use. Caffeine use Coffee, Tea. No drug use Tobacco use Former smoker.  Review of Systems  General Not Present- Appetite Loss, Chills, Fatigue, Fever, Night Sweats, Weight Gain and Weight Loss. Skin Not Present- Change in Wart/Mole, Dryness, Hives, Jaundice, New Lesions, Non-Healing Wounds, Rash and Ulcer. HEENT Present- Seasonal Allergies. Not Present- Earache, Hearing Loss, Hoarseness, Nose Bleed, Oral Ulcers, Ringing in the Ears, Sinus Pain, Sore Throat, Visual Disturbances, Wears glasses/contact lenses and Yellow Eyes. Respiratory Not Present- Bloody sputum, Chronic Cough, Difficulty Breathing, Snoring and Wheezing. Breast Not Present- Breast Mass, Breast Pain, Nipple Discharge and Skin Changes. Cardiovascular Not Present- Chest Pain, Difficulty Breathing Lying Down, Leg Cramps, Palpitations, Rapid Heart Rate, Shortness of Breath and Swelling of Extremities. Gastrointestinal  Not Present- Abdominal Pain, Bloating, Bloody Stool, Change in Bowel Habits, Chronic diarrhea, Constipation, Difficulty Swallowing, Excessive gas, Gets full quickly at meals, Hemorrhoids, Indigestion, Nausea, Rectal Pain and Vomiting. Female Genitourinary Not Present- Frequency, Nocturia, Painful Urination, Pelvic Pain and Urgency. Musculoskeletal Present- Swelling of Extremities. Not Present- Back Pain, Joint Pain, Joint Stiffness, Muscle Pain and Muscle Weakness. Neurological Not Present- Decreased Memory, Fainting, Headaches, Numbness, Seizures, Tingling, Tremor, Trouble walking and Weakness. Psychiatric Present- Anxiety and Fearful. Not Present- Bipolar, Change in Sleep Pattern, Depression and Frequent crying. Endocrine Not Present- Cold Intolerance, Excessive Hunger, Hair Changes, Heat Intolerance, Hot flashes and New Diabetes. Hematology Not Present- Easy Bruising, Excessive bleeding, Gland problems, HIV and Persistent Infections.   Vitals  Weight: 134 lb Height: 61in Body Surface Area: 1.62 m Body Mass Index: 25.32 kg/m Temp.: 98.72F(Temporal)  Pulse: 76 (Regular)  BP: 124/78 (Sitting, Left Arm, Standard)  Physical Exam Rolm Bookbinder MD; 07/29/2014 2:40 PM) Breast Note: healing right breast and right axillary incision cv rrr Lungs clear  Assessment & Plan Node positive breast cancer Story: she is doing well. I gave her exercises to begin and she will see pt. I discussed pos node. I do not think she needs further surgery for breast or nodes. She will need radiotherapy and I discussed likely need for chemo now. She will see Dr Jana Hakim and I did discuss a port with her today if needed.

## 2014-09-10 NOTE — Discharge Instructions (Signed)

## 2014-09-10 NOTE — Op Note (Signed)
Preoperative diagnosis: Stage II right breast cancer Postoperative diagnosis: Same as above Procedure: Right internal jugular ultrasound-guided powerport placement Surgeon: Dr. Serita Grammes Anesthesia: Gen. Consultations: None Specimens: None Drains: None Sponge needle count was correct at completion Disposition to recovery stable  Indications: This is a 64 year female with breast cancer. She now needs venous access for chemotherapy. I discussed port placement prior to beginning.  Procedure: After informed consent was obtained the patient was taken to the operating room. She was given antibiotics. Sequential compression devices were placed on her legs. She was then placed under general anesthesia with an LMA. She was then prepped and draped in the standard sterile surgical fashion. A surgical timeout was then performed.  I located the internal jugular vein using the ultrasound. I accessed this with a needle under direct vision. I then inserted the wire. This was confirmed to be in correct position with fluoroscopy. I then made a pocket on the right chest. I tunneled the line between the entry site and the pocket. I then placed the peel-away sheath and dilator under direct vision. The wire assembly was removed. I then placed the line and removed the sheath. I pulled the line back to be in the distal cava. There were no arrhythmias. I then attached this to the port. I sutured the port and 2 positions with 2-0 Prolene suture. This flushed easily and aspirated blood. I placed heparin in the port. I then closed this with 3-0 Vicryl, 4-0 Monocryl, and glue. She tolerated this well and was transferred to recovery.

## 2014-09-10 NOTE — Anesthesia Procedure Notes (Signed)
Procedure Name: LMA Insertion Date/Time: 09/10/2014 9:41 AM Performed by: Maryella Shivers Pre-anesthesia Checklist: Patient identified, Emergency Drugs available, Suction available and Patient being monitored Patient Re-evaluated:Patient Re-evaluated prior to inductionOxygen Delivery Method: Circle System Utilized Preoxygenation: Pre-oxygenation with 100% oxygen Intubation Type: IV induction Ventilation: Mask ventilation without difficulty LMA: LMA inserted LMA Size: 4.0 Number of attempts: 1 Airway Equipment and Method: Bite block Placement Confirmation: positive ETCO2 Tube secured with: Tape Dental Injury: Teeth and Oropharynx as per pre-operative assessment

## 2014-09-10 NOTE — Transfer of Care (Signed)
Immediate Anesthesia Transfer of Care Note  Patient: Jamie Dillon  Procedure(s) Performed: Procedure(s): INSERTION PORT-A-CATH (Right)  Patient Location: PACU  Anesthesia Type:General  Level of Consciousness: sedated  Airway & Oxygen Therapy: Patient Spontanous Breathing and Patient connected to face mask oxygen  Post-op Assessment: Report given to RN and Post -op Vital signs reviewed and stable  Post vital signs: Reviewed and stable  Last Vitals:  Filed Vitals:   09/10/14 0913  BP: 127/83  Pulse: 69  Temp: 36.7 C  Resp: 18    Complications: No apparent anesthesia complications

## 2014-09-10 NOTE — Anesthesia Postprocedure Evaluation (Signed)
  Anesthesia Post-op Note  Patient: Jamie Dillon  Procedure(s) Performed: Procedure(s): INSERTION PORT-A-CATH (Right)  Patient Location: PACU  Anesthesia Type:General  Level of Consciousness: awake  Airway and Oxygen Therapy: Patient Spontanous Breathing  Post-op Pain: mild  Post-op Assessment: Post-op Vital signs reviewed  Post-op Vital Signs: Reviewed  Last Vitals:  Filed Vitals:   09/10/14 1151  BP: 141/86  Pulse: 89  Temp: 36.5 C  Resp:     Complications: No apparent anesthesia complications

## 2014-09-11 ENCOUNTER — Encounter (HOSPITAL_BASED_OUTPATIENT_CLINIC_OR_DEPARTMENT_OTHER): Payer: Self-pay | Admitting: General Surgery

## 2014-09-12 ENCOUNTER — Other Ambulatory Visit: Payer: Self-pay | Admitting: *Deleted

## 2014-09-12 DIAGNOSIS — C50511 Malignant neoplasm of lower-outer quadrant of right female breast: Secondary | ICD-10-CM

## 2014-09-15 ENCOUNTER — Encounter: Payer: Self-pay | Admitting: *Deleted

## 2014-09-15 ENCOUNTER — Ambulatory Visit (HOSPITAL_BASED_OUTPATIENT_CLINIC_OR_DEPARTMENT_OTHER): Payer: Medicare Other

## 2014-09-15 ENCOUNTER — Other Ambulatory Visit (HOSPITAL_BASED_OUTPATIENT_CLINIC_OR_DEPARTMENT_OTHER): Payer: Medicare Other

## 2014-09-15 VITALS — BP 143/87 | HR 88 | Temp 98.6°F | Resp 16

## 2014-09-15 DIAGNOSIS — C50511 Malignant neoplasm of lower-outer quadrant of right female breast: Secondary | ICD-10-CM

## 2014-09-15 DIAGNOSIS — Z5189 Encounter for other specified aftercare: Secondary | ICD-10-CM

## 2014-09-15 DIAGNOSIS — Z5111 Encounter for antineoplastic chemotherapy: Secondary | ICD-10-CM

## 2014-09-15 LAB — CBC WITH DIFFERENTIAL/PLATELET
BASO%: 0.1 % (ref 0.0–2.0)
Basophils Absolute: 0 10*3/uL (ref 0.0–0.1)
EOS%: 0 % (ref 0.0–7.0)
Eosinophils Absolute: 0 10*3/uL (ref 0.0–0.5)
HCT: 42.6 % (ref 34.8–46.6)
HEMOGLOBIN: 15 g/dL (ref 11.6–15.9)
LYMPH%: 5.5 % — AB (ref 14.0–49.7)
MCH: 32.7 pg (ref 25.1–34.0)
MCHC: 35.2 g/dL (ref 31.5–36.0)
MCV: 92.8 fL (ref 79.5–101.0)
MONO#: 0.5 10*3/uL (ref 0.1–0.9)
MONO%: 2.8 % (ref 0.0–14.0)
NEUT#: 16.6 10*3/uL — ABNORMAL HIGH (ref 1.5–6.5)
NEUT%: 91.6 % — ABNORMAL HIGH (ref 38.4–76.8)
NRBC: 0 % (ref 0–0)
Platelets: 300 10*3/uL (ref 145–400)
RBC: 4.59 10*6/uL (ref 3.70–5.45)
RDW: 13 % (ref 11.2–14.5)
WBC: 18.1 10*3/uL — ABNORMAL HIGH (ref 3.9–10.3)
lymph#: 1 10*3/uL (ref 0.9–3.3)

## 2014-09-15 LAB — COMPREHENSIVE METABOLIC PANEL (CC13)
ALK PHOS: 84 U/L (ref 40–150)
ALT: 39 U/L (ref 0–55)
AST: 27 U/L (ref 5–34)
Albumin: 4.3 g/dL (ref 3.5–5.0)
Anion Gap: 13 mEq/L — ABNORMAL HIGH (ref 3–11)
BUN: 26.2 mg/dL — ABNORMAL HIGH (ref 7.0–26.0)
CALCIUM: 10.3 mg/dL (ref 8.4–10.4)
CO2: 22 mEq/L (ref 22–29)
CREATININE: 0.9 mg/dL (ref 0.6–1.1)
Chloride: 104 mEq/L (ref 98–109)
EGFR: 67 mL/min/{1.73_m2} — ABNORMAL LOW (ref >90)
Glucose: 143 mg/dl — ABNORMAL HIGH (ref 70–140)
Potassium: 4.6 mEq/L (ref 3.5–5.1)
Sodium: 139 mEq/L (ref 136–145)
Total Bilirubin: 0.59 mg/dL (ref 0.20–1.20)
Total Protein: 7.9 g/dL (ref 6.4–8.3)

## 2014-09-15 MED ORDER — SODIUM CHLORIDE 0.9 % IJ SOLN
10.0000 mL | INTRAMUSCULAR | Status: DC | PRN
  Administered 2014-09-15: 10 mL
  Filled 2014-09-15: qty 10

## 2014-09-15 MED ORDER — SODIUM CHLORIDE 0.9 % IV SOLN
Freq: Once | INTRAVENOUS | Status: AC
  Administered 2014-09-15: 11:00:00 via INTRAVENOUS

## 2014-09-15 MED ORDER — HEPARIN SOD (PORK) LOCK FLUSH 100 UNIT/ML IV SOLN
500.0000 [IU] | Freq: Once | INTRAVENOUS | Status: AC | PRN
  Administered 2014-09-15: 500 [IU]
  Filled 2014-09-15: qty 5

## 2014-09-15 MED ORDER — PEGFILGRASTIM 6 MG/0.6ML ~~LOC~~ PSKT
6.0000 mg | PREFILLED_SYRINGE | Freq: Once | SUBCUTANEOUS | Status: AC
Start: 2014-09-15 — End: 2014-09-15
  Administered 2014-09-15: 6 mg via SUBCUTANEOUS
  Filled 2014-09-15: qty 0.6

## 2014-09-15 MED ORDER — SODIUM CHLORIDE 0.9 % IV SOLN
600.0000 mg/m2 | Freq: Once | INTRAVENOUS | Status: AC
  Administered 2014-09-15: 960 mg via INTRAVENOUS
  Filled 2014-09-15: qty 48

## 2014-09-15 MED ORDER — SODIUM CHLORIDE 0.9 % IV SOLN
Freq: Once | INTRAVENOUS | Status: AC
  Administered 2014-09-15: 11:00:00 via INTRAVENOUS
  Filled 2014-09-15: qty 8

## 2014-09-15 MED ORDER — DOCETAXEL CHEMO INJECTION 160 MG/16ML
75.0000 mg/m2 | Freq: Once | INTRAVENOUS | Status: AC
  Administered 2014-09-15: 120 mg via INTRAVENOUS
  Filled 2014-09-15: qty 12

## 2014-09-15 NOTE — Progress Notes (Unsigned)
Met with pt during 1st chemotherapy treatment. Denies needs at this time. Relate she is a little anxious. Gave emotional support and encouragement. Request pt call with questions or concerns.

## 2014-09-15 NOTE — Patient Instructions (Signed)
[facility] Discharge Instructions for Patients Receiving Chemotherapy  Today you received the following chemotherapy agents taxatere, cytoxan.  To help prevent nausea and vomiting after your treatment, we encourage you to take your nausea medication as directed.   If you develop nausea and vomiting that is not controlled by your nausea medication, call the clinic.   BELOW ARE SYMPTOMS THAT SHOULD BE REPORTED IMMEDIATELY:  *FEVER GREATER THAN 100.5 F  *CHILLS WITH OR WITHOUT FEVER  NAUSEA AND VOMITING THAT IS NOT CONTROLLED WITH YOUR NAUSEA MEDICATION  *UNUSUAL SHORTNESS OF BREATH  *UNUSUAL BRUISING OR BLEEDING  TENDERNESS IN MOUTH AND THROAT WITH OR WITHOUT PRESENCE OF ULCERS  *URINARY PROBLEMS  *BOWEL PROBLEMS  UNUSUAL RASH Items with * indicate a potential emergency and should be followed up as soon as possible.  Feel free to call the clinic you have any questions or concerns. The clinic phone number is [phone].  Cyclophosphamide injection What is this medicine? CYCLOPHOSPHAMIDE (sye kloe FOSS fa mide) is a chemotherapy drug. It slows the growth of cancer cells. This medicine is used to treat many types of cancer like lymphoma, myeloma, leukemia, breast cancer, and ovarian cancer, to name a few. This medicine may be used for other purposes; ask your health care provider or pharmacist if you have questions. COMMON BRAND NAME(S): Cytoxan, Neosar What should I tell my health care provider before I take this medicine? They need to know if you have any of these conditions: -blood disorders -history of other chemotherapy -infection -kidney disease -liver disease -recent or ongoing radiation therapy -tumors in the bone marrow -an unusual or allergic reaction to cyclophosphamide, other chemotherapy, other medicines, foods, dyes, or preservatives -pregnant or trying to get pregnant -breast-feeding How should I use this medicine? This drug is usually  given as an injection into a vein or muscle or by infusion into a vein. It is administered in a hospital or clinic by a specially trained health care professional. Talk to your pediatrician regarding the use of this medicine in children. Special care may be needed. Overdosage: If you think you have taken too much of this medicine contact a poison control center or emergency room at once. NOTE: This medicine is only for you. Do not share this medicine with others. What if I miss a dose? It is important not to miss your dose. Call your doctor or health care professional if you are unable to keep an appointment. What may interact with this medicine? This medicine may interact with the following medications: -amiodarone -amphotericin B -azathioprine -certain antiviral medicines for HIV or AIDS such as protease inhibitors (e.g., indinavir, ritonavir) and zidovudine -certain blood pressure medications such as benazepril, captopril, enalapril, fosinopril, lisinopril, moexipril, monopril, perindopril, quinapril, ramipril, trandolapril -certain cancer medications such as anthracyclines (e.g., daunorubicin, doxorubicin), busulfan, cytarabine, paclitaxel, pentostatin, tamoxifen, trastuzumab -certain diuretics such as chlorothiazide, chlorthalidone, hydrochlorothiazide, indapamide, metolazone -certain medicines that treat or prevent blood clots like warfarin -certain muscle relaxants such as succinylcholine -cyclosporine -etanercept -indomethacin -medicines to increase blood counts like filgrastim, pegfilgrastim, sargramostim -medicines used as general anesthesia -metronidazole -natalizumab This list may not describe all possible interactions. Give your health care provider a list of all the medicines, herbs, non-prescription drugs, or dietary supplements you use. Also tell them if you smoke, drink alcohol, or use illegal drugs. Some items may interact with your medicine. What should I watch for while  using this medicine? Visit your doctor for checks on your progress. This drug may make you  feel generally unwell. This is not uncommon, as chemotherapy can affect healthy cells as well as cancer cells. Report any side effects. Continue your course of treatment even though you feel ill unless your doctor tells you to stop. Drink water or other fluids as directed. Urinate often, even at night. In some cases, you may be given additional medicines to help with side effects. Follow all directions for their use. Call your doctor or health care professional for advice if you get a fever, chills or sore throat, or other symptoms of a cold or flu. Do not treat yourself. This drug decreases your body's ability to fight infections. Try to avoid being around people who are sick. This medicine may increase your risk to bruise or bleed. Call your doctor or health care professional if you notice any unusual bleeding. Be careful brushing and flossing your teeth or using a toothpick because you may get an infection or bleed more easily. If you have any dental work done, tell your dentist you are receiving this medicine. You may get drowsy or dizzy. Do not drive, use machinery, or do anything that needs mental alertness until you know how this medicine affects you. Do not become pregnant while taking this medicine or for 1 year after stopping it. Women should inform their doctor if they wish to become pregnant or think they might be pregnant. Men should not father a child while taking this medicine and for 4 months after stopping it. There is a potential for serious side effects to an unborn child. Talk to your health care professional or pharmacist for more information. Do not breast-feed an infant while taking this medicine. This medicine may interfere with the ability to have a child. This medicine has caused ovarian failure in some women. This medicine has caused reduced sperm counts in some men. You should talk with  your doctor or health care professional if you are concerned about your fertility. If you are going to have surgery, tell your doctor or health care professional that you have taken this medicine. What side effects may I notice from receiving this medicine? Side effects that you should report to your doctor or health care professional as soon as possible: -allergic reactions like skin rash, itching or hives, swelling of the face, lips, or tongue -low blood counts - this medicine may decrease the number of white blood cells, red blood cells and platelets. You may be at increased risk for infections and bleeding. -signs of infection - fever or chills, cough, sore throat, pain or difficulty passing urine -signs of decreased platelets or bleeding - bruising, pinpoint red spots on the skin, black, tarry stools, blood in the urine -signs of decreased red blood cells - unusually weak or tired, fainting spells, lightheadedness -breathing problems -dark urine -dizziness -palpitations -swelling of the ankles, feet, hands -trouble passing urine or change in the amount of urine -weight gain -yellowing of the eyes or skin Side effects that usually do not require medical attention (report to your doctor or health care professional if they continue or are bothersome): -changes in nail or skin color -hair loss -missed menstrual periods -mouth sores -nausea, vomiting This list may not describe all possible side effects. Call your doctor for medical advice about side effects. You may report side effects to FDA at 1-800-FDA-1088. Where should I keep my medicine? This drug is given in a hospital or clinic and will not be stored at home. NOTE: This sheet is a summary. It may not  cover all possible information. If you have questions about this medicine, talk to your doctor, pharmacist, or health care provider.  2015, Elsevier/Gold Standard. (2012-03-16 16:22:58) Docetaxel injection What is this  medicine? DOCETAXEL (doe se TAX el) is a chemotherapy drug. It targets fast dividing cells, like cancer cells, and causes these cells to die. This medicine is used to treat many types of cancers like breast cancer, certain stomach cancers, head and neck cancer, lung cancer, and prostate cancer. This medicine may be used for other purposes; ask your health care provider or pharmacist if you have questions. COMMON BRAND NAME(S): Docefrez, Taxotere What should I tell my health care provider before I take this medicine? They need to know if you have any of these conditions: -infection (especially a virus infection such as chickenpox, cold sores, or herpes) -liver disease -low blood counts, like low white cell, platelet, or red cell counts -an unusual or allergic reaction to docetaxel, polysorbate 80, other chemotherapy agents, other medicines, foods, dyes, or preservatives -pregnant or trying to get pregnant -breast-feeding How should I use this medicine? This drug is given as an infusion into a vein. It is administered in a hospital or clinic by a specially trained health care professional. Talk to your pediatrician regarding the use of this medicine in children. Special care may be needed. Overdosage: If you think you have taken too much of this medicine contact a poison control center or emergency room at once. NOTE: This medicine is only for you. Do not share this medicine with others. What if I miss a dose? It is important not to miss your dose. Call your doctor or health care professional if you are unable to keep an appointment. What may interact with this medicine? -cyclosporine -erythromycin -ketoconazole -medicines to increase blood counts like filgrastim, pegfilgrastim, sargramostim -vaccines Talk to your doctor or health care professional before taking any of these medicines: -acetaminophen -aspirin -ibuprofen -ketoprofen -naproxen This list may not describe all possible  interactions. Give your health care provider a list of all the medicines, herbs, non-prescription drugs, or dietary supplements you use. Also tell them if you smoke, drink alcohol, or use illegal drugs. Some items may interact with your medicine. What should I watch for while using this medicine? Your condition will be monitored carefully while you are receiving this medicine. You will need important blood work done while you are taking this medicine. This drug may make you feel generally unwell. This is not uncommon, as chemotherapy can affect healthy cells as well as cancer cells. Report any side effects. Continue your course of treatment even though you feel ill unless your doctor tells you to stop. In some cases, you may be given additional medicines to help with side effects. Follow all directions for their use. Call your doctor or health care professional for advice if you get a fever, chills or sore throat, or other symptoms of a cold or flu. Do not treat yourself. This drug decreases your body's ability to fight infections. Try to avoid being around people who are sick. This medicine may increase your risk to bruise or bleed. Call your doctor or health care professional if you notice any unusual bleeding. Be careful brushing and flossing your teeth or using a toothpick because you may get an infection or bleed more easily. If you have any dental work done, tell your dentist you are receiving this medicine. Avoid taking products that contain aspirin, acetaminophen, ibuprofen, naproxen, or ketoprofen unless instructed by your doctor. These  medicines may hide a fever. This medicine contains an alcohol in the product. You may get drowsy or dizzy. Do not drive, use machinery, or do anything that needs mental alertness until you know how this medicine affects you. Do not stand or sit up quickly, especially if you are an older patient. This reduces the risk of dizzy or fainting spells. Avoid alcoholic  drinks Do not become pregnant while taking this medicine. Women should inform their doctor if they wish to become pregnant or think they might be pregnant. There is a potential for serious side effects to an unborn child. Talk to your health care professional or pharmacist for more information. Do not breast-feed an infant while taking this medicine. What side effects may I notice from receiving this medicine? Side effects that you should report to your doctor or health care professional as soon as possible: -allergic reactions like skin rash, itching or hives, swelling of the face, lips, or tongue -low blood counts - This drug may decrease the number of white blood cells, red blood cells and platelets. You may be at increased risk for infections and bleeding. -signs of infection - fever or chills, cough, sore throat, pain or difficulty passing urine -signs of decreased platelets or bleeding - bruising, pinpoint red spots on the skin, black, tarry stools, nosebleeds -signs of decreased red blood cells - unusually weak or tired, fainting spells, lightheadedness -breathing problems -fast or irregular heartbeat -low blood pressure -mouth sores -nausea and vomiting -pain, swelling, redness or irritation at the injection site -pain, tingling, numbness in the hands or feet -swelling of the ankle, feet, hands -weight gain Side effects that usually do not require medical attention (report to your prescriber or health care professional if they continue or are bothersome): -bone pain -complete hair loss including hair on your head, underarms, pubic hair, eyebrows, and eyelashes -diarrhea -excessive tearing -changes in the color of fingernails -loosening of the fingernails -nausea -muscle pain -red flush to skin -sweating -weak or tired This list may not describe all possible side effects. Call your doctor for medical advice about side effects. You may report side effects to FDA at  1-800-FDA-1088. Where should I keep my medicine? This drug is given in a hospital or clinic and will not be stored at home. NOTE: This sheet is a summary. It may not cover all possible information. If you have questions about this medicine, talk to your doctor, pharmacist, or health care provider.  2015, Elsevier/Gold Standard. (2013-03-28 22:21:02)

## 2014-09-16 ENCOUNTER — Ambulatory Visit: Payer: Self-pay

## 2014-09-16 ENCOUNTER — Telehealth: Payer: Self-pay | Admitting: *Deleted

## 2014-09-16 NOTE — Telephone Encounter (Signed)
Chemo follow up call. Left message for her to call us back with any questions or concerns

## 2014-09-16 NOTE — Telephone Encounter (Signed)
-----   Message from Arty Baumgartner, RN sent at 09/15/2014  1:06 PM EDT ----- Regarding: 1st time chemo/GM Contact: [phone] 1st time taxotere/cytoxan Dr.Magrinat

## 2014-09-21 ENCOUNTER — Emergency Department (HOSPITAL_COMMUNITY): Payer: Medicare Other

## 2014-09-21 ENCOUNTER — Encounter (HOSPITAL_COMMUNITY): Payer: Self-pay | Admitting: Nurse Practitioner

## 2014-09-21 ENCOUNTER — Inpatient Hospital Stay (HOSPITAL_COMMUNITY): Payer: Medicare Other

## 2014-09-21 ENCOUNTER — Inpatient Hospital Stay (HOSPITAL_COMMUNITY)
Admission: EM | Admit: 2014-09-21 | Discharge: 2014-09-24 | DRG: 872 | Disposition: A | Discharge status: Home / Self Care | Payer: Medicare Other | Attending: Internal Medicine | Admitting: Internal Medicine

## 2014-09-21 DIAGNOSIS — Z79899 Other long term (current) drug therapy: Secondary | ICD-10-CM

## 2014-09-21 DIAGNOSIS — E785 Hyperlipidemia, unspecified: Secondary | ICD-10-CM | POA: Diagnosis present

## 2014-09-21 DIAGNOSIS — F419 Anxiety disorder, unspecified: Secondary | ICD-10-CM | POA: Diagnosis present

## 2014-09-21 DIAGNOSIS — Z833 Family history of diabetes mellitus: Secondary | ICD-10-CM | POA: Diagnosis not present

## 2014-09-21 DIAGNOSIS — R17 Unspecified jaundice: Secondary | ICD-10-CM | POA: Diagnosis present

## 2014-09-21 DIAGNOSIS — Z853 Personal history of malignant neoplasm of breast: Secondary | ICD-10-CM | POA: Diagnosis present

## 2014-09-21 DIAGNOSIS — K625 Hemorrhage of anus and rectum: Secondary | ICD-10-CM | POA: Diagnosis present

## 2014-09-21 DIAGNOSIS — C50511 Malignant neoplasm of lower-outer quadrant of right female breast: Secondary | ICD-10-CM | POA: Diagnosis present

## 2014-09-21 DIAGNOSIS — A419 Sepsis, unspecified organism: Principal | ICD-10-CM | POA: Diagnosis present

## 2014-09-21 DIAGNOSIS — D696 Thrombocytopenia, unspecified: Secondary | ICD-10-CM | POA: Diagnosis present

## 2014-09-21 DIAGNOSIS — Z87891 Personal history of nicotine dependence: Secondary | ICD-10-CM

## 2014-09-21 DIAGNOSIS — A4151 Sepsis due to Escherichia coli [E. coli]: Secondary | ICD-10-CM | POA: Diagnosis not present

## 2014-09-21 DIAGNOSIS — N390 Urinary tract infection, site not specified: Secondary | ICD-10-CM | POA: Diagnosis present

## 2014-09-21 DIAGNOSIS — Z803 Family history of malignant neoplasm of breast: Secondary | ICD-10-CM

## 2014-09-21 DIAGNOSIS — Z79891 Long term (current) use of opiate analgesic: Secondary | ICD-10-CM

## 2014-09-21 DIAGNOSIS — Z7952 Long term (current) use of systemic steroids: Secondary | ICD-10-CM

## 2014-09-21 DIAGNOSIS — Z8249 Family history of ischemic heart disease and other diseases of the circulatory system: Secondary | ICD-10-CM

## 2014-09-21 DIAGNOSIS — T451X5A Adverse effect of antineoplastic and immunosuppressive drugs, initial encounter: Secondary | ICD-10-CM | POA: Diagnosis present

## 2014-09-21 DIAGNOSIS — E872 Acidosis: Secondary | ICD-10-CM | POA: Diagnosis present

## 2014-09-21 DIAGNOSIS — E871 Hypo-osmolality and hyponatremia: Secondary | ICD-10-CM | POA: Diagnosis present

## 2014-09-21 DIAGNOSIS — R5081 Fever presenting with conditions classified elsewhere: Secondary | ICD-10-CM | POA: Diagnosis present

## 2014-09-21 DIAGNOSIS — Z90710 Acquired absence of both cervix and uterus: Secondary | ICD-10-CM

## 2014-09-21 DIAGNOSIS — D6481 Anemia due to antineoplastic chemotherapy: Secondary | ICD-10-CM | POA: Diagnosis present

## 2014-09-21 DIAGNOSIS — D709 Neutropenia, unspecified: Secondary | ICD-10-CM | POA: Diagnosis present

## 2014-09-21 DIAGNOSIS — Z170 Estrogen receptor positive status [ER+]: Secondary | ICD-10-CM | POA: Diagnosis not present

## 2014-09-21 DIAGNOSIS — I10 Essential (primary) hypertension: Secondary | ICD-10-CM | POA: Diagnosis present

## 2014-09-21 DIAGNOSIS — E780 Pure hypercholesterolemia: Secondary | ICD-10-CM | POA: Diagnosis present

## 2014-09-21 DIAGNOSIS — I444 Left anterior fascicular block: Secondary | ICD-10-CM | POA: Diagnosis present

## 2014-09-21 DIAGNOSIS — I471 Supraventricular tachycardia: Secondary | ICD-10-CM

## 2014-09-21 DIAGNOSIS — K6289 Other specified diseases of anus and rectum: Secondary | ICD-10-CM | POA: Diagnosis present

## 2014-09-21 DIAGNOSIS — K602 Anal fissure, unspecified: Secondary | ICD-10-CM | POA: Diagnosis present

## 2014-09-21 DIAGNOSIS — B9620 Unspecified Escherichia coli [E. coli] as the cause of diseases classified elsewhere: Secondary | ICD-10-CM | POA: Diagnosis present

## 2014-09-21 DIAGNOSIS — R509 Fever, unspecified: Secondary | ICD-10-CM | POA: Diagnosis not present

## 2014-09-21 DIAGNOSIS — N3000 Acute cystitis without hematuria: Secondary | ICD-10-CM | POA: Diagnosis not present

## 2014-09-21 DIAGNOSIS — R000 Tachycardia, unspecified: Secondary | ICD-10-CM | POA: Insufficient documentation

## 2014-09-21 LAB — URINALYSIS, ROUTINE W REFLEX MICROSCOPIC
Bilirubin Urine: NEGATIVE
Glucose, UA: NEGATIVE mg/dL
KETONES UR: NEGATIVE mg/dL
NITRITE: POSITIVE — AB
PH: 7 (ref 5.0–8.0)
PROTEIN: 30 mg/dL — AB
Specific Gravity, Urine: 1.009 (ref 1.005–1.030)
UROBILINOGEN UA: 0.2 mg/dL (ref 0.0–1.0)

## 2014-09-21 LAB — CBC WITH DIFFERENTIAL/PLATELET
Basophils Absolute: 0 10*3/uL (ref 0.0–0.1)
Basophils Relative: 0 % (ref 0–1)
EOS ABS: 0 10*3/uL (ref 0.0–0.7)
Eosinophils Relative: 1 % (ref 0–5)
HEMATOCRIT: 29.8 % — AB (ref 36.0–46.0)
Hemoglobin: 10.4 g/dL — ABNORMAL LOW (ref 12.0–15.0)
Lymphocytes Relative: 46 % (ref 12–46)
Lymphs Abs: 0.6 10*3/uL — ABNORMAL LOW (ref 0.7–4.0)
MCH: 32.4 pg (ref 26.0–34.0)
MCHC: 34.9 g/dL (ref 30.0–36.0)
MCV: 92.8 fL (ref 78.0–100.0)
MONO ABS: 0.5 10*3/uL (ref 0.1–1.0)
Monocytes Relative: 42 % — ABNORMAL HIGH (ref 3–12)
NEUTROS ABS: 0.1 10*3/uL — AB (ref 1.7–7.7)
Neutrophils Relative %: 11 % — ABNORMAL LOW (ref 43–77)
Platelets: 190 10*3/uL (ref 150–400)
RBC: 3.21 MIL/uL — AB (ref 3.87–5.11)
RDW: 12.8 % (ref 11.5–15.5)
WBC: 1.2 10*3/uL — AB (ref 4.0–10.5)

## 2014-09-21 LAB — URINE MICROSCOPIC-ADD ON

## 2014-09-21 LAB — PROCALCITONIN: Procalcitonin: 0.12 ng/mL

## 2014-09-21 LAB — COMPREHENSIVE METABOLIC PANEL
ALT: 16 U/L (ref 14–54)
AST: 30 U/L (ref 15–41)
Albumin: 3.2 g/dL — ABNORMAL LOW (ref 3.5–5.0)
Alkaline Phosphatase: 71 U/L (ref 38–126)
Anion gap: 10 (ref 5–15)
BUN: 15 mg/dL (ref 6–20)
CALCIUM: 8.6 mg/dL — AB (ref 8.9–10.3)
CO2: 23 mmol/L (ref 22–32)
Chloride: 95 mmol/L — ABNORMAL LOW (ref 101–111)
Creatinine, Ser: 0.75 mg/dL (ref 0.44–1.00)
GFR calc Af Amer: >60 mL/min (ref >60)
GFR calc non Af Amer: >60 mL/min (ref >60)
Glucose, Bld: 133 mg/dL — ABNORMAL HIGH (ref 70–99)
Potassium: 4.3 mmol/L (ref 3.5–5.1)
SODIUM: 128 mmol/L — AB (ref 135–145)
Total Bilirubin: 1.9 mg/dL — ABNORMAL HIGH (ref 0.3–1.2)
Total Protein: 6.6 g/dL (ref 6.5–8.1)

## 2014-09-21 LAB — POC OCCULT BLOOD, ED: Fecal Occult Bld: NEGATIVE

## 2014-09-21 LAB — I-STAT CG4 LACTIC ACID, ED
LACTIC ACID, VENOUS: 2.62 mmol/L — AB (ref 0.5–2.0)
LACTIC ACID, VENOUS: 1.15 mmol/L (ref 0.5–2.0)

## 2014-09-21 LAB — LIPASE, BLOOD: LIPASE: 22 U/L (ref 22–51)

## 2014-09-21 LAB — APTT: APTT: 30 s (ref 24–37)

## 2014-09-21 LAB — PROTIME-INR
INR: 1.15 (ref 0.00–1.49)
Prothrombin Time: 14.8 seconds (ref 11.6–15.2)

## 2014-09-21 MED ORDER — DEXTROSE 5 % IV SOLN
2.0000 g | Freq: Three times a day (TID) | INTRAVENOUS | Status: DC
  Administered 2014-09-22 – 2014-09-23 (×5): 2 g via INTRAVENOUS
  Filled 2014-09-21 (×8): qty 2

## 2014-09-21 MED ORDER — LORATADINE 10 MG PO TABS
10.0000 mg | ORAL_TABLET | Freq: Every day | ORAL | Status: DC
  Administered 2014-09-22 – 2014-09-24 (×3): 10 mg via ORAL
  Filled 2014-09-21 (×4): qty 1

## 2014-09-21 MED ORDER — ACETAMINOPHEN 500 MG PO TABS
1000.0000 mg | ORAL_TABLET | Freq: Once | ORAL | Status: AC
  Administered 2014-09-21: 1000 mg via ORAL
  Filled 2014-09-21: qty 2

## 2014-09-21 MED ORDER — DEXTROSE 5 % IV SOLN
2.0000 g | Freq: Once | INTRAVENOUS | Status: AC
  Administered 2014-09-21: 2 g via INTRAVENOUS
  Filled 2014-09-21: qty 2

## 2014-09-21 MED ORDER — B COMPLEX-C PO TABS
1.0000 | ORAL_TABLET | Freq: Every day | ORAL | Status: DC
  Administered 2014-09-22: 1 via ORAL
  Filled 2014-09-21: qty 1

## 2014-09-21 MED ORDER — MORPHINE SULFATE 4 MG/ML IJ SOLN
4.0000 mg | Freq: Once | INTRAMUSCULAR | Status: AC
  Administered 2014-09-21: 4 mg via INTRAVENOUS
  Filled 2014-09-21: qty 1

## 2014-09-21 MED ORDER — VANCOMYCIN HCL 10 G IV SOLR
1250.0000 mg | INTRAVENOUS | Status: AC
  Administered 2014-09-21: 1250 mg via INTRAVENOUS
  Filled 2014-09-21: qty 1250

## 2014-09-21 MED ORDER — ACETAMINOPHEN 325 MG PO TABS: 650.0000 mg | ORAL_TABLET | Freq: Four times a day (QID) | ORAL | Status: DC | PRN

## 2014-09-21 MED ORDER — CALCIUM CARBONATE-VITAMIN D 500-200 MG-UNIT PO TABS
1.0000 | ORAL_TABLET | Freq: Two times a day (BID) | ORAL | Status: DC
Start: 2014-09-21 — End: 2014-09-22
  Filled 2014-09-21 (×2): qty 1

## 2014-09-21 MED ORDER — OXYCODONE HCL 5 MG PO TABS: 5.0000 mg | ORAL_TABLET | ORAL | Status: DC | PRN

## 2014-09-21 MED ORDER — HYDROMORPHONE HCL 1 MG/ML IJ SOLN: 0.5000 mg | INTRAMUSCULAR | Status: DC | PRN

## 2014-09-21 MED ORDER — ACETAMINOPHEN 650 MG RE SUPP: 650.0000 mg | Freq: Four times a day (QID) | RECTAL | Status: DC | PRN

## 2014-09-21 MED ORDER — ALPRAZOLAM 0.5 MG PO TABS
0.5000 mg | ORAL_TABLET | Freq: Every evening | ORAL | Status: DC | PRN
  Administered 2014-09-22 – 2014-09-23 (×2): 0.5 mg via ORAL
  Filled 2014-09-21 (×2): qty 1

## 2014-09-21 MED ORDER — ALUM & MAG HYDROXIDE-SIMETH 200-200-20 MG/5ML PO SUSP
30.0000 mL | Freq: Four times a day (QID) | ORAL | Status: DC | PRN
  Filled 2014-09-21: qty 30

## 2014-09-21 MED ORDER — SODIUM CHLORIDE 0.9 % IV SOLN
INTRAVENOUS | Status: DC
  Administered 2014-09-22: 01:00:00 via INTRAVENOUS
  Administered 2014-09-23: 1000 mL via INTRAVENOUS

## 2014-09-21 MED ORDER — SODIUM CHLORIDE 0.9 % IV BOLUS (SEPSIS)
1000.0000 mL | INTRAVENOUS | Status: AC
  Administered 2014-09-21 (×2): 1000 mL via INTRAVENOUS

## 2014-09-21 MED ORDER — VANCOMYCIN HCL IN DEXTROSE 750-5 MG/150ML-% IV SOLN
750.0000 mg | Freq: Two times a day (BID) | INTRAVENOUS | Status: DC
  Administered 2014-09-22 – 2014-09-23 (×3): 750 mg via INTRAVENOUS
  Filled 2014-09-21 (×3): qty 150

## 2014-09-21 MED ORDER — ONDANSETRON HCL 4 MG/2ML IJ SOLN: 4.0000 mg | Freq: Four times a day (QID) | INTRAMUSCULAR | Status: DC | PRN

## 2014-09-21 MED ORDER — ONDANSETRON HCL 4 MG PO TABS: 4.0000 mg | ORAL_TABLET | Freq: Four times a day (QID) | ORAL | Status: DC | PRN

## 2014-09-21 MED ORDER — ATORVASTATIN CALCIUM 10 MG PO TABS
10.0000 mg | ORAL_TABLET | Freq: Every day | ORAL | Status: DC
  Administered 2014-09-22 – 2014-09-23 (×2): 10 mg via ORAL
  Filled 2014-09-21 (×4): qty 1

## 2014-09-21 MED ORDER — METOPROLOL SUCCINATE ER 50 MG PO TB24
50.0000 mg | ORAL_TABLET | Freq: Every day | ORAL | Status: DC
  Administered 2014-09-22 – 2014-09-24 (×3): 50 mg via ORAL
  Filled 2014-09-21 (×2): qty 1
  Filled 2014-09-21: qty 2
  Filled 2014-09-21: qty 1

## 2014-09-21 MED ORDER — ADULT MULTIVITAMIN W/MINERALS CH
1.0000 | ORAL_TABLET | Freq: Every day | ORAL | Status: DC
  Administered 2014-09-22 – 2014-09-24 (×3): 1 via ORAL
  Filled 2014-09-21 (×5): qty 1

## 2014-09-21 NOTE — Progress Notes (Addendum)
ANTIBIOTIC CONSULT NOTE - INITIAL  Pharmacy Consult for Vancomycin Indication: Febrile neutropenia  No Known Allergies  Patient Measurements: Height: 5' 0.75" (154.3 cm) Weight: 124 lb (56.246 kg) IBW/kg (Calculated) : 47.23  Vital Signs: Temp: 98.6 F (37 C) (05/08 1649) Temp Source: Oral (05/08 1649) BP: 140/66 mmHg (05/08 1649) Pulse Rate: 69 (05/08 1649) Intake/Output from previous day:   Intake/Output from this shift:    Labs:  Recent Labs  09/21/14 1706  WBC 1.2*  HGB 10.4*  PLT 190  CREATININE 0.75   Estimated Creatinine Clearance: 52.2 mL/min (by C-G formula based on Cr of 0.75). No results for input(s): VANCOTROUGH, VANCOPEAK, VANCORANDOM, GENTTROUGH, GENTPEAK, GENTRANDOM, TOBRATROUGH, TOBRAPEAK, TOBRARND, AMIKACINPEAK, AMIKACINTROU, AMIKACIN in the last 72 hours.   Microbiology: No results found for this or any previous visit (from the past 720 hour(s)).  Medical History: Past Medical History  Diagnosis Date  . Allergic rhinitis   . HTN (hypertension)   . HLD (hyperlipidemia)   . Tobacco abuse   . Family history of malignant neoplasm of breast   . Family history of other kidney diseases   . Disorder of bone and cartilage, unspecified   . Breast cancer of lower-outer quadrant of right female breast 06/19/2014  . Anxiety   . PONV (postoperative nausea and vomiting)     Medications:  Scheduled:  . acetaminophen  1,000 mg Oral Once   Infusions:  . ceFEPime (MAXIPIME) IV    . sodium chloride 1,000 mL (09/21/14 1822)  . vancomycin     PRN:   Assessment: 64 yo female with breast cancer s/p lumpectomy and the first cycle of chemotherapy on 5/1 who presents with fever of 101.7. Patient is neutropenic with ANC = 0.1, received Neupogen on 5/2. Cefepime 2g IV ordered in ED and Pharmacy consulted to dose vancomycin.  Goal of Therapy:  Vancomycin trough level 15-20 mcg/ml  Plan:   Vancomycin 1250mg  IV x 1, then 750mg  IV q12h Check trough at steady  state Follow up renal function & cultures Continue cefepime on admission?  Peggyann Juba, PharmD, BCPS Pager: [phone] 09/21/2014,6:50 PM   Addendum: Pharmacy is now consulted to dose Cefepime Continue with Cefepime 2g IV q8h  Peggyann Juba, PharmD, BCPS 09/21/2014 8:40 PM

## 2014-09-21 NOTE — ED Notes (Signed)
Pt presents with c/o abdominal pain (Tighness, tenderness and soreness) and dysuria onset was 6 days ago when she had her first chemo treatment. Also states while at home she took her temp and it was 101.7 F.

## 2014-09-21 NOTE — ED Notes (Signed)
Nurse drawing all labs

## 2014-09-21 NOTE — ED Notes (Signed)
Patient transported to X-ray 

## 2014-09-21 NOTE — H&P (Addendum)
[facility] Admission History and Physical       Jamie Dillon GQQ:[id number] DOB: 07/09/1950 DOA: 09/21/2014  Referring physician: EDP PCP: Loura Pardon, MD  Specialists:   Chief Complaint: Fever and Chills  HPI: Jamie Dillon is a 64 y.o. female with history of Rt Breast Cancer Dx 07/2014 and S/P Lumpectomy and currently undergoing chemotherapy with the first chemotherapy on 09/15/2014 who presents to the ED with complaints of fever , Chills and lower ABD discomfort and dysuria x 2 days.   She had a fever at home to 101.7.   She denies any nausea or vomiting but reports a loss of appetite due to dysgeusia.     She denies any sore throat, or sore tongue or diarrhea.     She was evaluated in the ED and was found to have sinus Tachycardia , Neutropenia as well as a Lactic Acidosis and a UTI.  She was started on the Sepsis Protocol and IV Vancomycin and Cefepime were Initiated.        Review of Systems:  Constitutional: No Weight Loss, No Weight Gain, Night Sweats, +Fevers, +Chills, Dizziness, Light Headedness, Fatigue, +Generalized Weakness HEENT: No Headaches, Difficulty Swallowing,Tooth/Dental Problems,Sore Throat,  No Sneezing, Rhinitis, Ear Ache, Nasal Congestion, or Post Nasal Drip,  Cardio-vascular:  No Chest pain, Orthopnea, PND, Edema in Lower Extremities, Anasarca, Dizziness, Palpitations  Resp: No Dyspnea, No DOE, No Productive Cough, No Non-Productive Cough, No Hemoptysis, No Wheezing.    GI: No Heartburn, Indigestion, +Abdominal Pain, Nausea, Vomiting, Diarrhea, Constipation, Hematemesis, Hematochezia, Melena, Change in Bowel Habits,  Loss of Appetite  GU:  +Dysuria, No Change in Color of Urine, No Urgency or Urinary Frequency, No Flank pain.  Musculoskeletal: No Joint Pain or Swelling, No Decreased Range of Motion, No Back Pain.  Neurologic: No Syncope, No Seizures, Muscle Weakness, Paresthesia, Vision Disturbance or Loss, No Diplopia, No Vertigo, No Difficulty Walking,    Skin: No Rash or Lesions. Psych: No Change in Mood or Affect, No Depression or Anxiety, No Memory loss, No Confusion, or Hallucinations   Past Medical History  Diagnosis Date  . Allergic rhinitis   . HTN (hypertension)   . HLD (hyperlipidemia)   . Tobacco abuse   . Family history of malignant neoplasm of breast   . Family history of other kidney diseases   . Disorder of bone and cartilage, unspecified   . Breast cancer of lower-outer quadrant of right female breast 06/19/2014  . Anxiety   . PONV (postoperative nausea and vomiting)      Past Surgical History  Procedure Laterality Date  . Total vaginal hysterectomy  1996    endometriosis  . Abdominal hysterectomy    . Appendectomy    . Colonoscopy    . Dilation and curettage of uterus    . Breast lumpectomy with needle localization and axillary sentinel lymph node bx Right 07/21/2014    Procedure: BREAST LUMPECTOMY WITH NEEDLE LOCALIZATION AND AXILLARY SENTINEL LYMPH NODE BIOPSY;  Surgeon: Rolm Bookbinder, MD;  Location: [place];  Service: General;  Laterality: Right;  . Portacath placement Right 09/10/2014    Procedure: INSERTION PORT-A-CATH;  Surgeon: Rolm Bookbinder, MD;  Location: [place];  Service: General;  Laterality: Right;      Prior to Admission medications   Medication Sig Start Date End Date Taking? Authorizing Provider  ALPRAZolam Duanne Moron) 0.5 MG tablet Take 1 tablet (0.5 mg total) by mouth daily as needed for anxiety. Patient taking differently: Take 0.5 mg by mouth at  bedtime as needed for anxiety.  07/08/14  Yes Abner Greenspan, MD  atorvastatin (LIPITOR) 10 MG tablet Take 1 tablet (10 mg total) by mouth daily. 04/25/14  Yes Abner Greenspan, MD  b complex vitamins tablet Take 1 tablet by mouth daily.    Yes Historical Provider, MD  Calcium Carbonate-Vitamin D (CALCIUM-VITAMIN D) 500-200 MG-UNIT per tablet Take 3 tablets by mouth every morning.     Yes Historical Provider, MD  cetirizine (ZYRTEC) 10 MG  tablet Take 10 mg by mouth daily as needed for allergies.    Yes Historical Provider, MD  dexamethasone (DECADRON) 4 MG tablet Take 2 tablets (8 mg total) by mouth 2 (two) times daily. Start the day before Taxotere. Then again the day after chemo for 3 days. 09/09/14  Yes Chauncey Cruel, MD  diphenhydrAMINE (BENADRYL) 25 MG tablet Take 25 mg by mouth daily as needed for allergies.    Yes Historical Provider, MD  fluticasone (FLONASE) 50 MCG/ACT nasal spray Place 2 sprays into both nostrils daily. Patient taking differently: Place 2 sprays into both nostrils daily as needed for allergies.  01/10/14  Yes Abner Greenspan, MD  lidocaine-prilocaine (EMLA) cream Apply to affected area once 09/09/14  Yes Chauncey Cruel, MD  lisinopril-hydrochlorothiazide (PRINZIDE,ZESTORETIC) 20-25 MG per tablet Take 1 tablet by mouth daily. 07/08/14  Yes Abner Greenspan, MD  metoprolol succinate (TOPROL-XL) 50 MG 24 hr tablet Take 1 tablet (50 mg total) by mouth daily. Take with or immediately following a meal. 04/25/14  Yes Abner Greenspan, MD  Multiple Vitamin (MULTIVITAMIN) tablet Take 1 tablet by mouth daily.     Yes Historical Provider, MD  ondansetron (ZOFRAN) 8 MG tablet Take twice a day. Start the evening of chemo day and continue for 3 days. Then take as needed for nausea or vomiting. 09/09/14  Yes Chauncey Cruel, MD  oxyCODONE-acetaminophen (PERCOCET) 10-325 MG per tablet Take 1 tablet by mouth every 6 (six) hours as needed for pain. 09/10/14 09/10/15 Yes Rolm Bookbinder, MD  pegfilgrastim (NEULASTA ONPRO KIT) 6 MG/0.6ML injection Inject 6 mg into the skin once. Inject via provided programmed delivery device.   Yes Historical Provider, MD  [place]   Yes Historical Provider, MD  prochlorperazine (COMPAZINE) 10 MG tablet Take one tablet 4 times a day (before meals and at bedtime) starting the evening of chemo day and continuing for 3 days for nausea 09/09/14  Yes Chauncey Cruel, MD    tobramycin-dexamethasone [facility]) ophthalmic solution Place 1 drop into both eyes 2 (two) times daily. 09/09/14  Yes Chauncey Cruel, MD  UNABLE TO FIND 1 each by Other route once. Dispense per medical necessity cranial prothesis due to alopecia induced by chemotherapy for breast cancer diagnosis 08/18/14  Yes Chauncey Cruel, MD  LORazepam (ATIVAN) 0.5 MG tablet Take 1 tablet (0.5 mg total) by mouth at bedtime as needed for sleep. Patient not taking: Reported on 09/21/2014 09/09/14   Chauncey Cruel, MD     No Known Allergies  Social History:  Married Able to perform all of her ADLs  reports that she quit smoking about 4 years ago. Her smoking use included Cigarettes. She smoked 1.00 pack per day. She does not have any smokeless tobacco history on file. She reports that she drinks alcohol. She reports that she does not use illicit drugs.    Family History  Problem Relation Age of Onset  . Breast cancer Sister   .  Hypertension Mother   . Kidney disease Father   . Diabetes Paternal Aunt        Physical Exam:  GEN:  Pleasant Ill Appearing Well Nourished and Well developed 64 y.o. Caucasian female examined and in no acute distress; cooperative with exam Filed Vitals:   09/21/14 1649 09/21/14 1754 09/21/14 1916  BP: 140/66  127/70  Pulse: 69  127  Temp: 98.6 F (37 C)    TempSrc: Oral    Resp: 17  22  Height:  5' 0.75" (1.543 m)   Weight:  56.246 kg (124 lb)   SpO2: 97%  94%   Blood pressure 127/70, pulse 127, temperature 98.6 F (37 C), temperature source Oral, resp. rate 22, height 5' 0.75" (1.543 m), weight 56.246 kg (124 lb), SpO2 94 %. PSYCH: She is alert and oriented x4; does not appear anxious does not appear depressed; affect is normal HEENT: Normocephalic and Atraumatic, Mucous membranes pink; PERRLA; EOM intact; Fundi:  Benign;  No scleral icterus, Nares: Patent, Oropharynx: Clear, Fair Dentition,    Neck:  FROM, No Cervical Lymphadenopathy nor Thyromegaly or Carotid  Bruit; No JVD; Breasts:: Not examined CHEST WALL: No tenderness CHEST: Normal respiration, clear to auscultation bilaterally HEART: Regular rate and rhythm; no murmurs rubs or gallops BACK: No kyphosis or scoliosis; No CVA tenderness ABDOMEN: Positive Bowel Sounds, Soft  Mildly Tender Lower ABD, No Rebound or Guarding; No Masses, No Organomegaly. Rectal Exam: Not done EXTREMITIES: No Cyanosis, Clubbing, or Edema; No Ulcerations. Genitalia: not examined PULSES: 2+ and symmetric SKIN: Normal hydration no rash or ulceration CNS:  Alert and Oriented x 4, No Focal Deficits Vascular: pulses palpable throughout    Labs on Admission:  Basic Metabolic Panel:  Recent Labs Lab 09/15/14 1029 09/21/14 1706  NA 139 128*  K 4.6 4.3  CL  --  95*  CO2 22 23  GLUCOSE 143* 133*  BUN 26.2* 15  CREATININE 0.9 0.75  CALCIUM 10.3 8.6*   Liver Function Tests:  Recent Labs Lab 09/15/14 1029 09/21/14 1706  AST 27 30  ALT 39 16  ALKPHOS 84 71  BILITOT 0.59 1.9*  PROT 7.9 6.6  ALBUMIN 4.3 3.2*    Recent Labs Lab 09/21/14 1706  LIPASE 22   No results for input(s): AMMONIA in the last 168 hours. CBC:  Recent Labs Lab 09/15/14 1029 09/21/14 1706  WBC 18.1* 1.2*  NEUTROABS 16.6* 0.1*  HGB 15.0 10.4*  HCT 42.6 29.8*  MCV 92.8 92.8  PLT 300 190   Cardiac Enzymes: No results for input(s): CKTOTAL, CKMB, CKMBINDEX, TROPONINI in the last 168 hours.  BNP (last 3 results) No results for input(s): BNP in the last 8760 hours.  ProBNP (last 3 results) No results for input(s): PROBNP in the last 8760 hours.  CBG: No results for input(s): GLUCAP in the last 168 hours.  Radiological Exams on Admission: Dg Chest 2 View  09/21/2014   CLINICAL DATA:  Fever and nausea. Abdominal tightness, 2 days duration. Personal history of right breast cancer. Last chemotherapy 09/15/2014.  EXAM: CHEST  2 VIEW  COMPARISON:  09/10/2014  FINDINGS: Heart size is normal. 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 The lungs  are clear. The vascularity is normal. Power port inserted from the right internal jugular approach has its tip in the SVC 2 cm above right atrium. No effusions. No bony abnormalities. Surgical clips in the region of the right breast.  IMPRESSION: No active disease.  Unfolded aorta.  Power port.   Electronically Signed  By: Nelson Chimes M.D.   On: 09/21/2014 17:28     EKG: Independently reviewed. Sinus Tachycardia rate =132 No Acute S-T changes   Assessment/Plan:   64 y.o. female with  Principal Problem:   1.   Neutropenic fever   Neutropenic Precautions   Stepdown Unit   Sepsis Protocol   Anti-Pyretics PRN   Active Problems:   2.   Sepsis   Sepsis Protocol initiated    Blood and Urine Cxs Sent   IV Vancomycin and Cefepime      3.   Sinus tachycardia   IVFs   Cardiac Monitoring     4.   Hyponatremia   Send Urine Electrolytes and OSM  To evaluated for SIADH     Associated with Paraneoplastic Syndrome   IVFs with NSS       5.   Breast cancer of lower-outer quadrant of right female breast   On Chemotherapy   Oncologist is Dr Gunnar Bulla Magrinat    Notify in AM     6.   HYPERCHOLESTEROLEMIA, PURE   Continue Atorvastatin Rx     7.   Essential hypertension   Continue Toprol as BP Tolerates   Hold Prinizide   Monitor BPs              8.    DVT Prophylaxis    SCDs       Note: Corrected Calcium level = 9.2   Code Status:     FULL CODE      Family Communication:   Husband at Bedside       Disposition Plan:    Inpatient  Status        Time spent: [place] [facility] Pager [phone]   If [place] Please Contact the Day Rounding Team MD for [facility]  If 7PM-7AM, Please Contact Night-Floor Coverage  [email or web address] Password [facility] 09/21/2014, 7:48 PM     ADDENDUM:   Patient was seen and examined on 09/21/2014

## 2014-09-21 NOTE — ED Notes (Signed)
Dr Regenia Skeeter notified of abnormal lactic acid.

## 2014-09-21 NOTE — ED Provider Notes (Signed)
CSN: [id number]     Arrival date & time 09/21/14  1640 History   First MD Initiated Contact with Patient 09/21/14 1646     Chief Complaint  Patient presents with  . Abdominal Pain  . Oncology Pt      (Consider location/radiation/quality/duration/timing/severity/associated sxs/prior Treatment) HPI  64 year old female with a history of breast cancer presents with a fever that started about one hour ago. Temperature was 101.7 orally. The patient has felt hot and cold all day. She recently had a lumpectomy and started chemotherapy for the first time 6 days ago. The patient has not had any headache, cough, shortness of breath. Has been having abdominal pain/tightness for the past 2 days. Had a large and painful bowel movement 2 days ago and since then his been having rectal pain and some bright red blood. She feels like she poor something. Is also been having dark almost black stools as well. Has had intermittent nausea but none now. No vomiting. Patient denies pain over her recent port site. Has been having dysuria but states it feels a little different than a UTI that she's had in the past. Did not take anything for her temperature prior to arrival.  Past Medical History  Diagnosis Date  . Allergic rhinitis   . HTN (hypertension)   . HLD (hyperlipidemia)   . Tobacco abuse   . Family history of malignant neoplasm of breast   . Family history of other kidney diseases   . Disorder of bone and cartilage, unspecified   . Breast cancer of lower-outer quadrant of right female breast 06/19/2014  . Anxiety   . PONV (postoperative nausea and vomiting)    Past Surgical History  Procedure Laterality Date  . Total vaginal hysterectomy  1996    endometriosis  . Abdominal hysterectomy    . Appendectomy    . Colonoscopy    . Dilation and curettage of uterus    . Breast lumpectomy with needle localization and axillary sentinel lymph node bx Right 07/21/2014    Procedure: BREAST LUMPECTOMY WITH NEEDLE  LOCALIZATION AND AXILLARY SENTINEL LYMPH NODE BIOPSY;  Surgeon: Rolm Bookbinder, MD;  Location: [place];  Service: General;  Laterality: Right;  . Portacath placement Right 09/10/2014    Procedure: INSERTION PORT-A-CATH;  Surgeon: Rolm Bookbinder, MD;  Location: [place];  Service: General;  Laterality: Right;   Family History  Problem Relation Age of Onset  . Breast cancer Sister   . Hypertension Mother   . Kidney disease Father   . Diabetes Paternal Aunt    History  Substance Use Topics  . Smoking status: Former Smoker -- 1.00 packs/day    Types: Cigarettes    Quit date: 06/25/2010  . Smokeless tobacco: Not on file  . Alcohol Use: 0.0 oz/week    0 Standard drinks or equivalent per week     Comment: occasional wine   OB History    No data available     Review of Systems  Constitutional: Positive for fever and chills.  Respiratory: Negative for cough and shortness of breath.   Gastrointestinal: Positive for nausea, abdominal pain, constipation and blood in stool. Negative for vomiting.  Genitourinary: Positive for dysuria.  Neurological: Negative for headaches.  All other systems reviewed and are negative.     Allergies  Review of patient's allergies indicates no known allergies.  Home Medications   Prior to Admission medications   Medication Sig Start Date End Date Taking? Authorizing Provider  PRESCRIPTION MEDICATION Chemo -  [place]   Yes Historical Provider, MD  ALPRAZolam Duanne Moron) 0.5 MG tablet Take 1 tablet (0.5 mg total) by mouth daily as needed for anxiety. 07/08/14   Abner Greenspan, MD  atorvastatin (LIPITOR) 10 MG tablet Take 1 tablet (10 mg total) by mouth daily. Patient taking differently: Take 10 mg by mouth daily at 6 PM.  04/25/14   Abner Greenspan, MD  b complex vitamins tablet Take 1 tablet by mouth daily.     Historical Provider, MD  Calcium Carbonate-Vitamin D (CALCIUM-VITAMIN D) 500-200 MG-UNIT per tablet Take 3 tablets by mouth every  morning.      Historical Provider, MD  cetirizine (ZYRTEC) 10 MG tablet Take 10 mg by mouth daily as needed for allergies.     Historical Provider, MD  dexamethasone (DECADRON) 4 MG tablet Take 2 tablets (8 mg total) by mouth 2 (two) times daily. Start the day before Taxotere. Then again the day after chemo for 3 days. 09/09/14   Chauncey Cruel, MD  diphenhydrAMINE (BENADRYL) 25 MG tablet Take 25 mg by mouth daily as needed for allergies.     Historical Provider, MD  fluticasone (FLONASE) 50 MCG/ACT nasal spray Place 2 sprays into both nostrils daily. Patient taking differently: Place 2 sprays into both nostrils daily as needed for allergies.  01/10/14   Abner Greenspan, MD  lidocaine-prilocaine (EMLA) cream Apply to affected area once 09/09/14   Chauncey Cruel, MD  lisinopril-hydrochlorothiazide (PRINZIDE,ZESTORETIC) 20-25 MG per tablet Take 1 tablet by mouth daily. 07/08/14   Abner Greenspan, MD  LORazepam (ATIVAN) 0.5 MG tablet Take 1 tablet (0.5 mg total) by mouth at bedtime as needed for sleep. 09/09/14   Chauncey Cruel, MD  metoprolol succinate (TOPROL-XL) 50 MG 24 hr tablet Take 1 tablet (50 mg total) by mouth daily. Take with or immediately following a meal. 04/25/14   Abner Greenspan, MD  Multiple Vitamin (MULTIVITAMIN) tablet Take 1 tablet by mouth daily.      Historical Provider, MD  ondansetron (ZOFRAN) 8 MG tablet Take twice a day. Start the evening of chemo day and continue for 3 days. Then take as needed for nausea or vomiting. 09/09/14   Chauncey Cruel, MD  oxyCODONE-acetaminophen (PERCOCET) 10-325 MG per tablet Take 1 tablet by mouth every 6 (six) hours as needed for pain. 09/10/14 09/10/15  Rolm Bookbinder, MD  prochlorperazine (COMPAZINE) 10 MG tablet Take one tablet 4 times a day (before meals and at bedtime) starting the evening of chemo day and continuing for 3 days for nausea 09/09/14   Chauncey Cruel, MD  tobramycin-dexamethasone [facility]) ophthalmic solution Place 1 drop  into both eyes 2 (two) times daily. 09/09/14   Chauncey Cruel, MD  UNABLE TO FIND 1 each by Other route once. Dispense per medical necessity cranial prothesis due to alopecia induced by chemotherapy for breast cancer diagnosis 08/18/14   Chauncey Cruel, MD   BP 140/66 mmHg  Pulse 69  Temp(Src) 98.6 F (37 C) (Oral)  Resp 17  SpO2 97% Physical Exam  Constitutional: She is oriented to person, place, and time. She appears well-developed and well-nourished.  HENT:  Head: Normocephalic and atraumatic.  Right Ear: External ear normal.  Left Ear: External ear normal.  Nose: Nose normal.  Eyes: Right eye exhibits no discharge. Left eye exhibits no discharge.  Cardiovascular: Normal heart sounds.  An irregular rhythm present. Tachycardia present.   Pulmonary/Chest: Effort normal and breath sounds normal. She has no  rales.  Right chest port with no swelling, erythema or tenderness  Abdominal: Soft. There is tenderness in the right lower quadrant, suprapubic area and left lower quadrant.  Genitourinary: Rectal exam shows fissure. Rectal exam shows no external hemorrhoid and no mass. Guaiac negative stool (no stool on rectal exam).     Neurological: She is alert and oriented to person, place, and time.  Skin: Skin is warm and dry.  Nursing note and vitals reviewed.   ED Course  Procedures (including critical care time) Labs Review Labs Reviewed  CBC WITH DIFFERENTIAL/PLATELET - Abnormal; Notable for the following:    WBC 1.2 (*)    RBC 3.21 (*)    Hemoglobin 10.4 (*)    HCT 29.8 (*)    Neutrophils Relative % 11 (*)    Monocytes Relative 42 (*)    Neutro Abs 0.1 (*)    Lymphs Abs 0.6 (*)    All other components within normal limits  COMPREHENSIVE METABOLIC PANEL - Abnormal; Notable for the following:    Sodium 128 (*)    Chloride 95 (*)    Glucose, Bld 133 (*)    Calcium 8.6 (*)    Albumin 3.2 (*)    Total Bilirubin 1.9 (*)    All other components within normal limits    URINALYSIS, ROUTINE W REFLEX MICROSCOPIC - Abnormal; Notable for the following:    APPearance CLOUDY (*)    Hgb urine dipstick MODERATE (*)    Protein, ur 30 (*)    Nitrite POSITIVE (*)    Leukocytes, UA MODERATE (*)    All other components within normal limits  URINE MICROSCOPIC-ADD ON - Abnormal; Notable for the following:    Bacteria, UA MANY (*)    All other components within normal limits  I-STAT CG4 LACTIC ACID, ED - Abnormal; Notable for the following:    Lactic Acid, Venous 2.62 (*)    All other components within normal limits  CULTURE, BLOOD (ROUTINE X 2)  CULTURE, BLOOD (ROUTINE X 2)  URINE CULTURE  MRSA PCR SCREENING  LIPASE, BLOOD  PROCALCITONIN  PROTIME-INR  APTT  PATHOLOGIST SMEAR REVIEW  OSMOLALITY, URINE  NA AND K (SODIUM & POTASSIUM), RAND UR  BASIC METABOLIC PANEL  CBC  POC OCCULT BLOOD, ED  I-STAT CG4 LACTIC ACID, ED    Imaging Review Dg Chest 2 View  09/21/2014   CLINICAL DATA:  Fever and nausea. Abdominal tightness, 2 days duration. Personal history of right breast cancer. Last chemotherapy 09/15/2014.  EXAM: CHEST  2 VIEW  COMPARISON:  09/10/2014  FINDINGS: Heart size is normal. 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 The lungs are clear. The vascularity is normal. Power port inserted from the right internal jugular approach has its tip in the SVC 2 cm above right atrium. No effusions. No bony abnormalities. Surgical clips in the region of the right breast.  IMPRESSION: No active disease.  Unfolded aorta.  Power port.   Electronically Signed   By: Nelson Chimes M.D.   On: 09/21/2014 17:28     EKG Interpretation   Date/Time:  Sunday Sep 21 2014 17:34:35 EDT Ventricular Rate:  132 PR Interval:  127 QRS Duration: 86 QT Interval:  313 QTC Calculation: 464 R Axis:   -47 Text Interpretation:  Sinus tachycardia Multiform ventricular premature  complexes Left anterior fascicular block Abnormal R-wave progression, late  transition Baseline wander in lead(s) V4 V5 V6 No old  tracing to compare  Confirmed by Terrica Duecker  MD, [place] [phone]) on 09/21/2014 5:58:06  PM      MDM   Final diagnoses:  Neutropenic fever  UTI (lower urinary tract infection)    Patient with neutropenic fever that appears to be due to a UTI. Patient became tachycardic all of a sudden which appears to be a sinus tachycardia. Mildly improved with IV fluids. Minimal lactate elevation, but given her neutropenia she will need broad-spectrum IV antibiotics and admission to the hospital for further monitoring and supportive care. No hypotension or signs of severe sepsis. Will admit to the hospitalist.    Sherwood Gambler, MD 09/22/14 0005

## 2014-09-22 ENCOUNTER — Telehealth: Payer: Self-pay | Admitting: *Deleted

## 2014-09-22 DIAGNOSIS — Z170 Estrogen receptor positive status [ER+]: Secondary | ICD-10-CM

## 2014-09-22 DIAGNOSIS — N390 Urinary tract infection, site not specified: Secondary | ICD-10-CM

## 2014-09-22 DIAGNOSIS — N3000 Acute cystitis without hematuria: Secondary | ICD-10-CM

## 2014-09-22 DIAGNOSIS — R5081 Fever presenting with conditions classified elsewhere: Secondary | ICD-10-CM

## 2014-09-22 LAB — BASIC METABOLIC PANEL
Anion gap: 9 (ref 5–15)
BUN: 9 mg/dL (ref 6–20)
CHLORIDE: 106 mmol/L (ref 101–111)
CO2: 18 mmol/L — ABNORMAL LOW (ref 22–32)
Calcium: 7.1 mg/dL — ABNORMAL LOW (ref 8.9–10.3)
Creatinine, Ser: 0.72 mg/dL (ref 0.44–1.00)
GFR calc Af Amer: >60 mL/min (ref >60)
GFR calc non Af Amer: >60 mL/min (ref >60)
Glucose, Bld: 131 mg/dL — ABNORMAL HIGH (ref 70–99)
POTASSIUM: 3.6 mmol/L (ref 3.5–5.1)
Sodium: 133 mmol/L — ABNORMAL LOW (ref 135–145)

## 2014-09-22 LAB — NA AND K (SODIUM & POTASSIUM), RAND UR
Potassium Urine: 25 mmol/L
Sodium, Ur: 13 mmol/L

## 2014-09-22 LAB — CBC
HEMATOCRIT: 31.1 % — AB (ref 36.0–46.0)
HEMOGLOBIN: 10.7 g/dL — AB (ref 12.0–15.0)
MCH: 32.1 pg (ref 26.0–34.0)
MCHC: 34.4 g/dL (ref 30.0–36.0)
MCV: 93.4 fL (ref 78.0–100.0)
Platelets: 128 10*3/uL — ABNORMAL LOW (ref 150–400)
RBC: 3.33 MIL/uL — AB (ref 3.87–5.11)
RDW: 13.1 % (ref 11.5–15.5)
WBC: 1.2 10*3/uL — AB (ref 4.0–10.5)

## 2014-09-22 LAB — OSMOLALITY, URINE: Osmolality, Ur: 278 mOsm/kg — ABNORMAL LOW (ref 390–1090)

## 2014-09-22 LAB — MRSA PCR SCREENING: MRSA by PCR: NEGATIVE

## 2014-09-22 MED ORDER — POLYETHYLENE GLYCOL 3350 17 G PO PACK: 17.0000 g | PACK | Freq: Every day | ORAL | Status: DC

## 2014-09-22 MED ORDER — BOOST / RESOURCE BREEZE PO LIQD
1.0000 | Freq: Three times a day (TID) | ORAL | Status: DC
  Administered 2014-09-22: 1 via ORAL

## 2014-09-22 MED ORDER — ENSURE ENLIVE PO LIQD
237.0000 mL | Freq: Two times a day (BID) | ORAL | Status: DC
  Administered 2014-09-22: 237 mL via ORAL
  Filled 2014-09-22: qty 237

## 2014-09-22 MED ORDER — DOCUSATE SODIUM 100 MG PO CAPS
200.0000 mg | ORAL_CAPSULE | Freq: Two times a day (BID) | ORAL | Status: DC
  Administered 2014-09-22: 200 mg via ORAL
  Filled 2014-09-22 (×4): qty 2

## 2014-09-22 MED ORDER — VITAMINS A & D EX OINT
TOPICAL_OINTMENT | CUTANEOUS | Status: AC
  Administered 2014-09-22: 22:00:00
  Filled 2014-09-22: qty 5

## 2014-09-22 MED ORDER — POLYETHYLENE GLYCOL 3350 17 G PO PACK
17.0000 g | PACK | Freq: Two times a day (BID) | ORAL | Status: DC
  Administered 2014-09-22: 17 g via ORAL
  Filled 2014-09-22 (×4): qty 1

## 2014-09-22 MED ORDER — TOBRAMYCIN-DEXAMETHASONE 0.3-0.1 % OP SUSP
1.0000 [drp] | Freq: Two times a day (BID) | OPHTHALMIC | Status: DC
  Administered 2014-09-22 – 2014-09-24 (×5): 1 [drp] via OPHTHALMIC
  Filled 2014-09-22 (×3): qty 2.5

## 2014-09-22 NOTE — Discharge Instructions (Signed)

## 2014-09-22 NOTE — Progress Notes (Addendum)
MD, Sarajane Jews notified of RN assessment of irregular heart rhythm as well as fluctuating heart rate between mid 50's to low 100's.  MD ordered an EKG. EKG results communicated to MD.

## 2014-09-22 NOTE — Progress Notes (Signed)
Patient with 4 loose bms today since taking miralax and colace this am. Will hold tonight. Paged and let K Schorr NP know about it but no new orders.

## 2014-09-22 NOTE — Plan of Care (Signed)
Problem: Consults Goal: Nutrition Consult-if indicated Outcome: Progressing Pt has poor appetite, loosing weight. Nutritional drink order to boost nutrition.   Problem: Phase I Progression Outcomes Goal: Voiding-avoid urinary catheter unless indicated Outcome: Progressing Avoiding urinary catheter, pt neutropenic.Pt voiding without difficulty.

## 2014-09-22 NOTE — Care Management Note (Signed)
Case Management Note  Patient Details  Name: Jamie Dillon MRN: [id number] Date of Birth: Aug 30, 1950  Subjective/Objective:            sepsis        Action/Plan: Will follow for needs  Expected Discharge Date:  09/24/14               Expected Discharge Plan:  Home/Self Care  In-House Referral:  NA  Discharge planning Services  CM Consult, NA  Post Acute Care Choice:    Choice offered to:  NA  DME Arranged:    DME Agency:     HH Arranged:    HH Agency:     Status of Service:  In process, will continue to follow  Medicare Important Message Given:    Date Medicare IM Given:    Medicare IM give by:    Date Additional Medicare IM Given:    Additional Medicare Important Message give by:     If discussed at [place] of Stay Meetings, dates discussed:    Additional Comments:  Leeroy Cha, RN 09/22/2014, 9:21 AM

## 2014-09-22 NOTE — Consult Note (Signed)
[place]  [phone] Fax:(336) [phone]     ID: Jamie Dillon DOB: Dec 20, 1950  MR#: [id number]  GDJ#:[id number]  Patient Care Team: Abner Greenspan, MD as PCP - General Rolm Bookbinder, MD as Consulting Physician (General Surgery) Chauncey Cruel, MD as Consulting Physician (Oncology) Eppie Gibson, MD as Attending Physician (Radiation Oncology) Rockwell Germany, RN as Registered Nurse Mauro Kaufmann, RN as Registered Nurse Holley Bouche, NP as Nurse Practitioner (Nurse Practitioner) PCP: Loura Pardon, MD OTHER MD:  CHIEF COMPLAINT: stage II breast cancer  CURRENT TREATMENT: adjuvant chemotherapy   INTERVAL HISTORY: Jamie Dillon had her first cycle of cytoxan/ taxotere 09/15/2014. She receieved neulasta through the [facility] system 05/03. She did well through Friday but over the weekend started having hard BMs and a feeling her insides were being "torn up." This wasn't exactly a cramping discomfort. She thinks she has tear on her bottom. She also had burning w urination, no hematuria. She noted a temperature >101 at home and presented to the ED 05/08, where she was pancultured and started on cefepime and vancomycin under sepsis protocol.  REVIEW OF SYSTEMS: She still feels weak. Had a small BM yesterday, still hard. No blood in stool. No tarry looking stools. No cramps. Denies unusual h/a, N/V, dysphagia, cough, phlegm production or SOB. A detailed ROS today was otherwise stable BREAST CANCER HISTORY:  From the original intake note:  Jamie Dillon had routine screening mammography at [facility] 06/05/2014. The breast density was category B. There was a 6 mm irregular mass in the right breast. On 06/11/2014 the patient underwent right breast ultrasonography at [facility] This confirmed a 7 mm all her than wide irregular mass in the right blast at the 8:00 position. Biopsy of this mass 06/17/2014 showed ([id number]) and invasive ductal carcinoma, grade 2, estrogen receptor 98%  positive with strong staining intensity, progesterone receptor 7% positive with moderate staining intensity, with an MIB-1 of 22%, and no HER-2 amplification, the signals ratio being 0.91 and the number per cell 1.50.  The patient's subsequent history is as detailed below  PAST MEDICAL HISTORY: Past Medical History  Diagnosis Date  . Allergic rhinitis   . HTN (hypertension)   . HLD (hyperlipidemia)   . Tobacco abuse   . Family history of malignant neoplasm of breast   . Family history of other kidney diseases   . Disorder of bone and cartilage, unspecified   . Breast cancer of lower-outer quadrant of right female breast 06/19/2014  . Anxiety   . PONV (postoperative nausea and vomiting)     PAST SURGICAL HISTORY: Past Surgical History  Procedure Laterality Date  . Total vaginal hysterectomy  1996    endometriosis  . Abdominal hysterectomy    . Appendectomy    . Colonoscopy    . Dilation and curettage of uterus    . Breast lumpectomy with needle localization and axillary sentinel lymph node bx Right 07/21/2014    Procedure: BREAST LUMPECTOMY WITH NEEDLE LOCALIZATION AND AXILLARY SENTINEL LYMPH NODE BIOPSY;  Surgeon: Rolm Bookbinder, MD;  Location: [place];  Service: General;  Laterality: Right;  . Portacath placement Right 09/10/2014    Procedure: INSERTION PORT-A-CATH;  Surgeon: Rolm Bookbinder, MD;  Location: [place];  Service: General;  Laterality: Right;    FAMILY HISTORY Family History  Problem Relation Age of Onset  . Breast cancer Sister   . Hypertension Mother   . Kidney disease Father   . Diabetes Paternal Aunt  GYNECOLOGIC HISTORY:  No LMP recorded. Patient has had a hysterectomy. Menarche age 1, first live birth age 47. The patient is GX P2. She had a total abdominal hysterectomy with bilateral salpingo-oophorectomy in 1996. She took hormone replacement for approximately 10 years, until 2009. She also took oral contraceptives for approximately  7 years remotely, with no complications  SOCIAL HISTORY:  The patient is currently retired, though she still works part-time at the [employer] Her husband "[name]" Kandiss Ihrig is retired from Press photographer. Son [name] "[name]" Bicknell teaches English at [employer] in [place]. Daughter [name] lives in [place] . She works as a Publishing rights manager MAINTENANCE: History  Substance Use Topics  . Smoking status: Former Smoker -- 1.00 packs/day    Types: Cigarettes    Quit date: 06/25/2010  . Smokeless tobacco: Not on file  . Alcohol Use: 0.0 oz/week    0 Standard drinks or equivalent per week     Comment: occasional wine      No Known Allergies  Current Facility-Administered Medications  Medication Dose Route Frequency Provider Last Rate Last Dose  . 0.9 %  sodium chloride infusion   Intravenous Continuous Theressa Millard, MD 100 mL/hr at 09/22/14 0039    . acetaminophen (TYLENOL) tablet 650 mg  650 mg Oral Q6H PRN Theressa Millard, MD       Or  . acetaminophen (TYLENOL) suppository 650 mg  650 mg Rectal Q6H PRN Theressa Millard, MD      . ALPRAZolam Duanne Moron) tablet 0.5 mg  0.5 mg Oral QHS PRN Theressa Millard, MD      . alum & mag hydroxide-simeth (MAALOX/MYLANTA) 200-200-20 MG/5ML suspension 30 mL  30 mL Oral Q6H PRN Theressa Millard, MD      . atorvastatin (LIPITOR) tablet 10 mg  10 mg Oral Daily Theressa Millard, MD      . B-complex with vitamin C tablet 1 tablet  1 tablet Oral Daily Harvette Evonnie Dawes, MD      . calcium-vitamin D (OSCAL WITH D) 500-200 MG-UNIT per tablet 1 tablet  1 tablet Oral BID Theressa Millard, MD   1 tablet at 09/22/14 0047  . ceFEPIme (MAXIPIME) 2 g in dextrose 5 % 50 mL IVPB  2 g Intravenous 3 times per day Emiliano Dyer, RPH   2 g at 09/22/14 0152  . feeding supplement (RESOURCE BREEZE) (RESOURCE BREEZE) liquid 1 Container  1 Container Oral TID BM Harvette Evonnie Dawes, MD      . HYDROmorphone  (DILAUDID) injection 0.5-1 mg  0.5-1 mg Intravenous Q3H PRN Theressa Millard, MD      . loratadine (CLARITIN) tablet 10 mg  10 mg Oral Daily Harvette Evonnie Dawes, MD      . metoprolol succinate (TOPROL-XL) 24 hr tablet 50 mg  50 mg Oral Daily Harvette Evonnie Dawes, MD      . multivitamin with minerals tablet 1 tablet  1 tablet Oral Daily Harvette Evonnie Dawes, MD      . ondansetron (ZOFRAN) tablet 4 mg  4 mg Oral Q6H PRN Theressa Millard, MD       Or  . ondansetron (ZOFRAN) injection 4 mg  4 mg Intravenous Q6H PRN Theressa Millard, MD      . oxyCODONE (Oxy IR/ROXICODONE) immediate release tablet 5 mg  5 mg Oral Q4H PRN Theressa Millard, MD      . vancomycin (VANCOCIN) IVPB 750  mg/150 ml premix  750 mg Intravenous Q12H Emiliano Dyer, RPH        OBJECTIVE: middle aged White woman examined in bed Filed Vitals:   09/22/14 0700  BP: 128/75  Pulse: 108  Temp:   Resp: 21     Body mass index is 25.42 kg/(m^2).     Sclerae unicteric, pupils round and equal Oropharynx no thrush or other lesions No cervical or supraclavicular adenopathy Lungs no rales or rhonchi Heart regular rate and rhythm Abd soft, nontender, positive bowel sounds MSK no focal spinal tenderness, no upper extremity lymphedema Neuro: nonfocal, well oriented, appropriate affect Breasts: deferred     LAB RESULTS:  CMP     Component Value Date/Time   NA 133* 09/22/2014 0330   NA 139 09/15/2014 1029   K 3.6 09/22/2014 0330   K 4.6 09/15/2014 1029   CL 106 09/22/2014 0330   CO2 18* 09/22/2014 0330   CO2 22 09/15/2014 1029   GLUCOSE 131* 09/22/2014 0330   GLUCOSE 143* 09/15/2014 1029   BUN 9 09/22/2014 0330   BUN 26.2* 09/15/2014 1029   CREATININE 0.72 09/22/2014 0330   CREATININE 0.9 09/15/2014 1029   CALCIUM 7.1* 09/22/2014 0330   CALCIUM 10.3 09/15/2014 1029   PROT 6.6 09/21/2014 1706   PROT 7.9 09/15/2014 1029   ALBUMIN 3.2* 09/21/2014 1706   ALBUMIN 4.3 09/15/2014 1029   AST 30 09/21/2014 1706   AST  27 09/15/2014 1029   ALT 16 09/21/2014 1706   ALT 39 09/15/2014 1029   ALKPHOS 71 09/21/2014 1706   ALKPHOS 84 09/15/2014 1029   BILITOT 1.9* 09/21/2014 1706   BILITOT 0.59 09/15/2014 1029   GFRNONAA >60 09/22/2014 0330   GFRAA >60 09/22/2014 0330    INo results found for: SPEP, UPEP  Lab Results  Component Value Date   WBC 1.2* 09/22/2014   NEUTROABS 0.1* 09/21/2014   HGB 10.7* 09/22/2014   HCT 31.1* 09/22/2014   MCV 93.4 09/22/2014   PLT 128* 09/22/2014    _0 @  No results found for: LABCA2  No components found for: LABCA125   Recent Labs Lab 09/21/14 2111  INR 1.15    Urinalysis    Component Value Date/Time   COLORURINE YELLOW 09/21/2014 1809   APPEARANCEUR CLOUDY* 09/21/2014 1809   LABSPEC 1.009 09/21/2014 1809   PHURINE 7.0 09/21/2014 1809   GLUCOSEU NEGATIVE 09/21/2014 1809   HGBUR MODERATE* 09/21/2014 1809   HGBUR negative 02/13/2008 0911   BILIRUBINUR NEGATIVE 09/21/2014 1809   KETONESUR NEGATIVE 09/21/2014 1809   PROTEINUR 30* 09/21/2014 1809   UROBILINOGEN 0.2 09/21/2014 1809   NITRITE POSITIVE* 09/21/2014 1809   LEUKOCYTESUR MODERATE* 09/21/2014 1809    STUDIES: Dg Chest 2 View  09/21/2014   CLINICAL DATA:  Fever and nausea. Abdominal tightness, 2 days duration. Personal history of right breast cancer. Last chemotherapy 09/15/2014.  EXAM: CHEST  2 VIEW  COMPARISON:  09/10/2014  FINDINGS: Heart size is normal. 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 The lungs are clear. The vascularity is normal. Power port inserted from the right internal jugular approach has its tip in the SVC 2 cm above right atrium. No effusions. No bony abnormalities. Surgical clips in the region of the right breast.  IMPRESSION: No active disease.  Unfolded aorta.  Power port.   Electronically Signed   By: Nelson Chimes M.D.   On: 09/21/2014 17:28   Dg Chest Port 1 View  09/10/2014   CLINICAL DATA:  Port-A-Cath placement.  Right breast cancer.  EXAM: PORTABLE CHEST - 1 VIEW   COMPARISON:  None.  FINDINGS: Power injectable right-sided Port-A-Cath noted with tip projecting over the lower SVC. No pneumothorax. Upper normal heart size. Low lung volumes are present, causing crowding of the pulmonary vasculature.  IMPRESSION: 1. Port-A-Cath tip: Lower SVC. No pneumothorax or complicating feature.   Electronically Signed   By: Van Clines M.D.   On: 09/10/2014 10:43   Dg Fluoro Guide Cv Line-no Report  09/10/2014   CLINICAL DATA:    FLOURO GUIDE CV LINE  Fluoroscopy was utilized by the requesting physician.  No radiographic  interpretation.     ASSESSMENT: 64 y.o. [place] woman admitted with febrile neutropenia, currently day 8 cycle 1 cytoxan/ taxotere  (0) status post right breast biopsy 06/17/2014 for a clinical T1b N0, stage I invasive ductal carcinoma, grade 2, estrogen receptor strongly positive, progesterone receptor moderately positive, with no HER-2 amplification and an MIB-1 of 22%.  (1) status post right lumpectomy and right axillary sentinel lymph node sampling 07/21/2014 for a pT1b pN1a, stage IIA invasive ductal carcinoma, grade 1, with negative margins, repeat HER-2/neu again negative.  (2) adjuvant chemotherapy consisting of cyclophosphamide and docetaxel given every 3 weeks with Neulasta support, started 09/15/2014  (3) the patient will require adjuvant radiation therapy  (4) anti-estrogens to follow radiation    PLAN: Given her presenting symptoms, the source for her infection likely GI. She received neulasta 05/03 and I would anticipate WBC recovery next 24-72 hours.  Agree w current ABXs. She c/o a rectal tear that may need localized attention. Will add bowel prophylaxis. Will follow with you.  Greatly appreciate your help to this patient!   Chauncey Cruel, MD   09/22/2014 7:37 AM Medical Oncology and Hematology [facility] [address] [place], [place] [address] Tel. [phone]    Fax. [phone]

## 2014-09-22 NOTE — Progress Notes (Signed)
Date:  Sep 22, 2014 U.R. performed for needs and level of care. Will continue to follow for Case Management needs.  Rhonda Davis, RN, BSN, CCM   [phone] 

## 2014-09-22 NOTE — Telephone Encounter (Signed)
Received a call from pt husband [name] informing of pt admission to hospital for fever, neutropenia and abdominal pain. Will cancel f/u with [facility] tomorrow for nadir check. Encourage pt to call with needs or questions.

## 2014-09-22 NOTE — Progress Notes (Signed)
Initial Nutrition Assessment  DOCUMENTATION CODES:  Not applicable  INTERVENTION:  Ensure Enlive (each supplement provides 350kcal and 20 grams of protein) (BID)  Encourage PO intake (small, frequent meals) RD to continue to monitor  NUTRITION DIAGNOSIS:  Increased nutrient needs related to cancer and cancer related treatments as evidenced by estimated needs.  GOAL:  Patient will meet greater than or equal to 90% of their needs  MONITOR:  PO intake, Supplement acceptance, Labs, Weight trends, Skin, I & O's  REASON FOR ASSESSMENT:  Malnutrition Screening Tool    ASSESSMENT: 64 y.o. female with history of Rt Breast Cancer Dx 07/2014 and S/P Lumpectomy and currently undergoing chemotherapy with the first chemotherapy on 09/15/2014 who presents to the ED with complaints of fever. She denies any nausea or vomiting but reports a loss of appetite due to dysgeusia.    Pt in room with husband at bedside. Pt was eating lunch of cream soup, pudding and chocolate milk. Pt states her appetite has improved since admission. Pt was only eating smoothies her husband provided her for the past 2 days. Pt states she has an aversion to overly sweet foods and prefers salty foods. Pt is + for taste changes, states her "gums do not feel right".   Per weight history and pt weight is stable. Pt has been ordered [employer] supplements but given that she does not like sweet foods at this time, will switch to Ensure supplements. Will follow-up for tolerance.  Nutrition focused physical exam shows no sign of depletion of muscle mass or body fat.  Labs reviewed: Low Na  Height:  Ht Readings from Last 1 Encounters:  09/22/14 5\' 1"  (1.549 m)    Weight:  Wt Readings from Last 1 Encounters:  09/22/14 137 lb (62.143 kg)    Ideal Body Weight:  47.7 kg  Wt Readings from Last 10 Encounters:  09/22/14 137 lb (62.143 kg)  09/10/14 134 lb (60.782 kg)  09/09/14 134 lb 3.2 oz (60.873 kg)   07/31/14 131 lb 12.8 oz (59.784 kg)  07/21/14 133 lb (60.328 kg)  07/15/14 133 lb 1.9 oz (60.383 kg)  07/08/14 136 lb (61.689 kg)  07/01/14 135 lb (61.236 kg)  06/25/14 134 lb 3.2 oz (60.873 kg)  04/25/14 135 lb (61.236 kg)    BMI:  Body mass index is 25.9 kg/(m^2).  Estimated Nutritional Needs:  Kcal:  1850-2050  Protein:  90-100g  Fluid:  1.9L/day     Skin:  Wound (see comment) (Neck incision)  Diet Order:  Diet full liquid Room service appropriate?: Yes; Fluid consistency:: Thin  EDUCATION NEEDS:  No education needs identified at this time   Intake/Output Summary (Last 24 hours) at 09/22/14 1255 Last data filed at 09/22/14 0700  Gross per 24 hour  Intake 1109.67 ml  Output    800 ml  Net 309.67 ml    Last BM:  5/8  Clayton Bibles, MS, RD, LDN Pager: [phone] After Hours Pager: [phone]

## 2014-09-22 NOTE — Progress Notes (Signed)
Pt transferred to [address] via wheelchair with all belongings. Family updated at bedside. Report given and all questions answered.

## 2014-09-22 NOTE — Progress Notes (Signed)
PROGRESS NOTE  Jamie Dillon PXT:[id number] DOB: [birth year] DOA: 09/21/2014 PCP: Loura Pardon, MD  Summary: 64 year old woman with breast cancer, received chemotherapy for first-time 5/2 presented with fever, lower abdominal pain, dysuria, blood with bowel movement. She was admitted for neutropenic fever and further evaluation.  Assessment/Plan: 1. Neutropenic fever with possible early sepsis. Hemodynamics stable. Lactic acid has returned normal. No hypoxia. 2. Abdominal pain, bright red blood per rectum, small anal fissure. Stable/improved. Monitor clinically. 3. UTI. Continue empiric antibiotics, follow-up culture data. 4. Hyponatremia. Nearly resolved with volume resuscitation. Monitor clinically. 5. Normocytic anemia. Stable. Likely related to chemotherapy. 6. Thrombocytopenia. Suspect secondary to chemotherapy, consider sepsis. 7. Modest hyperbilirubinemia. Not clearly significant. 8. Breast cancer diagnosed 07/2014, status post lumpectomy, first chemotherapy 5/2. Status post Neulasta 5/3. Appreciate Dr. Virgie Dad care.   Appears to be improving. Hemodynamics are stable. Transfer to medical floor.  Plan continue broad-spectrum antibiotics, follow-up culture data  CBC, CMP in the morning  Code Status: full code DVT prophylaxis: SCDs Family Communication: d/w husband at bedside in detail Disposition Plan: home, 5/11?  Murray Hodgkins, MD  [facility]  Pager [phone] If 7PM-7AM, please contact night-coverage at [email or web address], password [facility] 09/22/2014, 7:59 AM  LOS: 1 day   Consultants:  Oncology   Procedures:    Antibiotics:  Cefepime 5/8 >>  Vancomycin 5/8 >>  HPI/Subjective: Feels better, no SOB. Some lower abdominal discomfort. No vomiting.  Objective: Filed Vitals:   09/22/14 0400 09/22/14 0500 09/22/14 0600 09/22/14 0700  BP: 124/62 116/66 119/58 128/75  Pulse: 117 58 113 108  Temp: 99.9 F (37.7 C)  99.1 F (37.3 C)   TempSrc: Oral  Oral   Resp:  23 23 22 21   Height:      Weight:   61 kg (134 lb 7.7 oz)   SpO2: 96% 94% 95% 98%    Intake/Output Summary (Last 24 hours) at 09/22/14 0759 Last data filed at 09/22/14 0700  Gross per 24 hour  Intake 1109.67 ml  Output    800 ml  Net 309.67 ml     Filed Weights   09/21/14 1754 09/21/14 2242 09/22/14 0600  Weight: 56.246 kg (124 lb) 62 kg (136 lb 11 oz) 61 kg (134 lb 7.7 oz)    Exam:     Afebrile, maximum temperature 99.9, vital signs stable, modest tachycardia. Normotensive. No hypoxia. General: Appears calm and comfortable sitting inchair Eyes: PERRL, normal lids, irises   ENT: grossly normal hearing, lips   Cardiovascular: tachycardic, regular rhythm, no m/r/g. No LE edema. Telemetry: ST  Respiratory: CTA bilaterally, no w/r/r. Normal respiratory effort. Abdomen: soft, ntnd Skin: no rash or induration noted Musculoskeletal: grossly normal tone BUE/BLE Psychiatric: grossly normal mood and affect, speech fluent and appropriate  New data reviewed:  Urine output 800  Sodium 128 >> 133  Total bilirubin 1.9  Lactic acid 2.62 >> 1.15  Pro-calcitonin 0.12  WBC 1.2 >> 1.2  Hemoglobin 10.4 >> 10.7  Platelets 190  >> 128  Urinalysis grossly positive  Pertinent data since admission:  Chest x-ray no acute disease  EKG sinus tachycardia  Pending data:  Urine culture  Blood cultures  Scheduled Meds: . atorvastatin  10 mg Oral Daily  . B-complex with vitamin C  1 tablet Oral Daily  . calcium-vitamin D  1 tablet Oral BID  . ceFEPime (MAXIPIME) IV  2 g Intravenous 3 times per day  . docusate sodium  200 mg Oral BID  . feeding supplement (RESOURCE BREEZE)  1 Container  Oral TID BM  . loratadine  10 mg Oral Daily  . metoprolol succinate  50 mg Oral Daily  . multivitamin with minerals  1 tablet Oral Daily  . polyethylene glycol  17 g Oral Daily  . vancomycin  750 mg Intravenous Q12H   Continuous Infusions: . sodium chloride 100 mL/hr at 09/22/14 7414     Principal Problem:   Neutropenic fever Active Problems:   Essential hypertension   Breast cancer of lower-outer quadrant of right female breast   Sepsis   Hyponatremia   UTI (urinary tract infection)   Time spent 20 minutes

## 2014-09-23 ENCOUNTER — Other Ambulatory Visit: Payer: Self-pay

## 2014-09-23 ENCOUNTER — Ambulatory Visit: Payer: Self-pay | Admitting: Nurse Practitioner

## 2014-09-23 DIAGNOSIS — A4151 Sepsis due to Escherichia coli [E. coli]: Secondary | ICD-10-CM

## 2014-09-23 LAB — BASIC METABOLIC PANEL
Anion gap: 5 (ref 5–15)
BUN: 6 mg/dL (ref 6–20)
CALCIUM: 7.7 mg/dL — AB (ref 8.9–10.3)
CHLORIDE: 109 mmol/L (ref 101–111)
CO2: 21 mmol/L — ABNORMAL LOW (ref 22–32)
CREATININE: 0.6 mg/dL (ref 0.44–1.00)
GFR calc non Af Amer: >60 mL/min (ref >60)
Glucose, Bld: 95 mg/dL (ref 70–99)
Potassium: 3 mmol/L — ABNORMAL LOW (ref 3.5–5.1)
Sodium: 135 mmol/L (ref 135–145)

## 2014-09-23 LAB — URINE CULTURE: Colony Count: >=100000

## 2014-09-23 LAB — CBC
HEMATOCRIT: 29.4 % — AB (ref 36.0–46.0)
Hemoglobin: 10.2 g/dL — ABNORMAL LOW (ref 12.0–15.0)
MCH: 32.2 pg (ref 26.0–34.0)
MCHC: 34.7 g/dL (ref 30.0–36.0)
MCV: 92.7 fL (ref 78.0–100.0)
Platelets: 164 10*3/uL (ref 150–400)
RBC: 3.17 MIL/uL — ABNORMAL LOW (ref 3.87–5.11)
RDW: 13.1 % (ref 11.5–15.5)
WBC: 10.7 10*3/uL — AB (ref 4.0–10.5)

## 2014-09-23 MED ORDER — POTASSIUM CHLORIDE CRYS ER 20 MEQ PO TBCR
40.0000 meq | EXTENDED_RELEASE_TABLET | Freq: Once | ORAL | Status: AC
  Administered 2014-09-23: 40 meq via ORAL
  Filled 2014-09-23: qty 2

## 2014-09-23 MED ORDER — LOPERAMIDE HCL 2 MG PO CAPS
4.0000 mg | ORAL_CAPSULE | Freq: Once | ORAL | Status: AC
  Administered 2014-09-23: 4 mg via ORAL
  Filled 2014-09-23: qty 2

## 2014-09-23 MED ORDER — POTASSIUM CHLORIDE 10 MEQ/100ML IV SOLN
10.0000 meq | INTRAVENOUS | Status: AC
  Administered 2014-09-23 (×4): 10 meq via INTRAVENOUS
  Filled 2014-09-23 (×4): qty 100

## 2014-09-23 MED ORDER — CEFTRIAXONE SODIUM IN DEXTROSE 20 MG/ML IV SOLN
1.0000 g | INTRAVENOUS | Status: DC
  Administered 2014-09-23: 1 g via INTRAVENOUS
  Filled 2014-09-23: qty 50

## 2014-09-23 NOTE — Plan of Care (Signed)
Problem: Phase I Progression Outcomes Goal: OOB as tolerated unless otherwise ordered Outcome: Completed/Met Date Met:  09/23/14 oob in room on own

## 2014-09-23 NOTE — Progress Notes (Signed)
Still with loose stools after taking laxatives yesterday. Paged NP.

## 2014-09-23 NOTE — Progress Notes (Signed)
   09/23/14 1500  Clinical Encounter Type  Visited With Patient  Visit Type Follow-up;Spiritual support;Social support  Referral From (Bary Castilla, RN, Acupuncturist, [place])  Spiritual Encounters  Spiritual Needs Emotional;Grief support  Stress Factors  Patient Stress Factors Loss of control;Major life changes;Health changes;Exhausted   Met with Jamie Dillon for >60 min, providing pastoral presence and listening hospitality as she shared and processed her story:  caregiving challenges with her mom, sister's death four years ago, loss of control and significant suffering through chemo, unfulfilled hopes for retirement.  Explored themes of grief, healing, guilt, and coping and meaning-making tools.  Normalized experience/emotions.  Explored spiritual needs and resources.  Suggested Hospice as resource for free grief counseling, as past counseling maxed out insurance coverage.  [name] verbalized gratitude and plans to connect for further support (by appt/in infusion room), recognizing that Spiritual Care is a constructive and supportive resource for her.  Please also page as needs arise.  Thank you.  [place], [place]

## 2014-09-23 NOTE — Plan of Care (Signed)
Problem: Phase I Progression Outcomes Goal: Hemodynamically stable Outcome: Progressing Afebrile

## 2014-09-23 NOTE — Progress Notes (Signed)
PROGRESS NOTE  Jamie Dillon FWY:[id number] DOB: Mar 10, 1951 DOA: 09/21/2014 PCP: Loura Pardon, MD  Summary: 64 year old woman with breast cancer, received chemotherapy for first-time 5/2 presented with fever, lower abdominal pain, dysuria, blood with bowel movement. She was admitted for neutropenic fever and further evaluation.  Assessment/Plan: 1. Neutropenic fever with possible early sepsis. Much improved. Fever has resolved, no clinical features of sepsis. 2. E coli UTI. Stable. Change to Rocephin. 3. Abdominal pain, bright red blood per rectum, small anal fissure. Stable. 4. Hyponatremia. Resolved. 5. Normocytic anemia. Stable. Likely related to chemotherapy. 6. Thrombocytopenia. Resolved. Secondary to infection or chemotherapy. 7. Modest hyperbilirubinemia.  8. Breast cancer diagnosed 07/2014, status post lumpectomy, first chemotherapy 5/2. Status post Neulasta 5/3.    Rapidly improving. Narrow therapy to ceftriaxone.  Likely home on oral antibiotics 5/11.  Code Status: full code DVT prophylaxis: SCDs Family Communication: d/w husband at bedside 5/10 Disposition Plan: home, 5/11  Murray Hodgkins, MD  [facility]  Pager [phone] If 7PM-7AM, please contact night-coverage at [email or web address], password [facility] 09/23/2014, 3:17 PM  LOS: 2 days   Consultants:  Oncology   Procedures:    Antibiotics:  Cefepime 5/8 >>  Vancomycin 5/8 >> 5/10  HPI/Subjective: Feeling better, no pain, tolerating diet. No n/v.  Objective: Filed Vitals:   09/22/14 2025 09/23/14 0122 09/23/14 0453 09/23/14 1321  BP: 115/57  135/61 126/65  Pulse: 98  98 100  Temp: 99.7 F (37.6 C) 98.7 F (37.1 C) 99.6 F (37.6 C) 99.1 F (37.3 C)  TempSrc: Oral Oral Oral Oral  Resp: 20  20 16   Height:      Weight:      SpO2: 100%  96% 98%    Intake/Output Summary (Last 24 hours) at 09/23/14 1517 Last data filed at 09/23/14 1321  Gross per 24 hour  Intake 2814.58 ml  Output   2200 ml  Net  614.58 ml     Filed Weights   09/21/14 2242 09/22/14 0600 09/22/14 1138  Weight: 62 kg (136 lb 11 oz) 61 kg (134 lb 7.7 oz) 62.143 kg (137 lb)    Exam:    Afebrile, vital signs stable. No hypoxia. General:  Appears comfortable, calm. Cardiovascular: Regular rate and rhythm, no murmur, rub or gallop. No lower extremity edema. Respiratory: Clear to auscultation bilaterally, no wheezes, rales or rhonchi. Normal respiratory effort. Psychiatric: grossly normal mood and affect, speech fluent and appropriate  New data reviewed:  Urine output 1300  Sodium is normalized. Potassium 3.0. CO2 has improved, 21.  WBC up to 10.2. Hemoglobin stable 10.2. Platelet count has normalized, 164.   Urine culture E coli sensitive to ceftriaxone  Pertinent data since admission:  Chest x-ray no acute disease  EKG sinus tachycardia  Pending data:  Blood cultures  Scheduled Meds: . atorvastatin  10 mg Oral Daily  . ceFEPime (MAXIPIME) IV  2 g Intravenous 3 times per day  . feeding supplement (ENSURE ENLIVE)  237 mL Oral BID BM  . loratadine  10 mg Oral Daily  . metoprolol succinate  50 mg Oral Daily  . multivitamin with minerals  1 tablet Oral Daily  . tobramycin-dexamethasone  1 drop Both Eyes BID   Continuous Infusions:    Principal Problem:   Neutropenic fever Active Problems:   Essential hypertension   Breast cancer of lower-outer quadrant of right female breast   Sepsis   Hyponatremia   UTI (urinary tract infection)   Time spent 20 minutes

## 2014-09-23 NOTE — Progress Notes (Signed)
K+ 3.0 this am. Paged NP Schorr with results via amion.

## 2014-09-24 ENCOUNTER — Telehealth: Payer: Self-pay | Admitting: Nurse Practitioner

## 2014-09-24 ENCOUNTER — Other Ambulatory Visit: Payer: Self-pay | Admitting: Oncology

## 2014-09-24 LAB — BASIC METABOLIC PANEL
Anion gap: 9 (ref 5–15)
BUN: 5 mg/dL — AB (ref 6–20)
CHLORIDE: 103 mmol/L (ref 101–111)
CO2: 23 mmol/L (ref 22–32)
CREATININE: 0.79 mg/dL (ref 0.44–1.00)
Calcium: 7.9 mg/dL — ABNORMAL LOW (ref 8.9–10.3)
Glucose, Bld: 127 mg/dL — ABNORMAL HIGH (ref 70–99)
Potassium: 3.5 mmol/L (ref 3.5–5.1)
Sodium: 135 mmol/L (ref 135–145)

## 2014-09-24 MED ORDER — ENSURE ENLIVE PO LIQD: 237.0000 mL | Freq: Two times a day (BID) | ORAL | Status: DC

## 2014-09-24 MED ORDER — CIPROFLOXACIN HCL 500 MG PO TABS: 500.0000 mg | ORAL_TABLET | Freq: Two times a day (BID) | ORAL | Status: DC

## 2014-09-24 NOTE — Progress Notes (Signed)
Pt with husband at bedside given d/c instructions, f/u appts, when to call MD, s/s infection, bleeding, prescriptions given, medications reviewed--pt voiced understanding and asked appropriate questions.  Pt belongings packed, home with husband in stable condition.

## 2014-09-24 NOTE — Telephone Encounter (Signed)
Called and left a message with a new follow up appointment

## 2014-09-24 NOTE — Discharge Summary (Signed)
Physician Discharge Summary  Jamie Dillon NAT:[id number] DOB: 1950-11-26 DOA: 09/21/2014  PCP: Loura Pardon, MD  Admit date: 09/21/2014 Discharge date: 09/24/2014  Time spent: 30 minutes  Recommendations for Outpatient Follow-up:  1. Follow up with PCP and oncology as recommended  Discharge Diagnoses:  Principal Problem:   Neutropenic fever Active Problems:   Essential hypertension   Breast cancer of lower-outer quadrant of right female breast   Sepsis   Hyponatremia   UTI (urinary tract infection)   Discharge Condition: much improved.   Diet recommendation: low sodium diet.   Filed Weights   09/21/14 2242 09/22/14 0600 09/22/14 1138  Weight: 62 kg (136 lb 11 oz) 61 kg (134 lb 7.7 oz) 62.143 kg (137 lb)    History of present illness:  64 year old woman with breast cancer, received chemotherapy for first-time 5/2 presented with fever, lower abdominal pain, dysuria, blood with bowel movement. She was admitted for neutropenic fever and further evaluation.  Hospital Course:  1. Neutropenic fever with possible early sepsis. Much improved. Fever has resolved, no clinical features of sepsis at the time of discharge. bp stable. 2. E coli UTI. Plan to complete the course of the antibiotic. 3. Abdominal pain, bright red blood per rectum, small anal fissure. Stable. 4. Hyponatremia. Resolved. 5. Normocytic anemia. Stable. Likely related to chemotherapy. 6. Thrombocytopenia. Resolved. Secondary to infection or chemotherapy. 7. Modest hyperbilirubinemia.  8. Breast cancer diagnosed 07/2014, status post lumpectomy, first chemotherapy 5/2. Status post Neulasta 5/3.outpatient follow up with oncology in one week. Scheduled .  9. Hypertension: bp soft on admission. Plan to hold BP medications for a week and resume it as per your PCP in one week.    Procedures:  none  Consultations:  oncology  Discharge Exam: Filed Vitals:   09/24/14 0548  BP: 126/68  Pulse: 84  Temp: 98.6 F (37  C)  Resp: 16    General: alert afebrile comfortable Cardiovascular: s1s2 Respiratory: ctab  Discharge Instructions   Discharge Instructions    Diet - low sodium heart healthy    Complete by:  As directed      Discharge instructions    Complete by:  As directed   Follow up with oncology as recommended.          Current Discharge Medication List    START taking these medications   Details  ciprofloxacin (CIPRO) 500 MG tablet Take 1 tablet (500 mg total) by mouth 2 (two) times daily. Qty: 8 tablet, Refills: 0    feeding supplement, ENSURE ENLIVE, (ENSURE ENLIVE) LIQD Take 237 mLs by mouth 2 (two) times daily between meals. Qty: 237 mL, Refills: 12      CONTINUE these medications which have NOT CHANGED   Details  ALPRAZolam (XANAX) 0.5 MG tablet Take 1 tablet (0.5 mg total) by mouth daily as needed for anxiety. Qty: 30 tablet, Refills: 1    atorvastatin (LIPITOR) 10 MG tablet Take 1 tablet (10 mg total) by mouth daily. Qty: 30 tablet, Refills: 11    b complex vitamins tablet Take 1 tablet by mouth daily.     Calcium Carbonate-Vitamin D (CALCIUM-VITAMIN D) 500-200 MG-UNIT per tablet Take 3 tablets by mouth every morning.      cetirizine (ZYRTEC) 10 MG tablet Take 10 mg by mouth daily as needed for allergies.     dexamethasone (DECADRON) 4 MG tablet Take 2 tablets (8 mg total) by mouth 2 (two) times daily. Start the day before Taxotere. Then again the day after chemo for 3  days. Otho Darner: 30 tablet, Refills: 1   Associated Diagnoses: Breast cancer of lower-outer quadrant of right female breast    diphenhydrAMINE (BENADRYL) 25 MG tablet Take 25 mg by mouth daily as needed for allergies.     fluticasone (FLONASE) 50 MCG/ACT nasal spray Place 2 sprays into both nostrils daily. Qty: 16 g, Refills: 6    metoprolol succinate (TOPROL-XL) 50 MG 24 hr tablet Take 1 tablet (50 mg total) by mouth daily. Take with or immediately following a meal. Qty: 30 tablet, Refills: 11     Multiple Vitamin (MULTIVITAMIN) tablet Take 1 tablet by mouth daily.      ondansetron (ZOFRAN) 8 MG tablet Take twice a day. Start the evening of chemo day and continue for 3 days. Then take as needed for nausea or vomiting. Qty: 30 tablet, Refills: 1   Associated Diagnoses: Breast cancer of lower-outer quadrant of right female breast    oxyCODONE-acetaminophen (PERCOCET) 10-325 MG per tablet Take 1 tablet by mouth every 6 (six) hours as needed for pain. Qty: 10 tablet, Refills: 0    pegfilgrastim (NEULASTA ONPRO KIT) 6 MG/0.6ML injection Inject 6 mg into the skin once. Inject via provided programmed delivery device.    PRESCRIPTION MEDICATION Chemo - [facility]    prochlorperazine (COMPAZINE) 10 MG tablet Take one tablet 4 times a day (before meals and at bedtime) starting the evening of chemo day and continuing for 3 days for nausea Qty: 30 tablet, Refills: 1   Associated Diagnoses: Breast cancer of lower-outer quadrant of right female breast    tobramycin-dexamethasone (TOBRADEX) ophthalmic solution Place 1 drop into both eyes 2 (two) times daily. Qty: 5 mL, Refills: 0   Associated Diagnoses: Breast cancer of lower-outer quadrant of right female breast    UNABLE TO FIND 1 each by Other route once. Dispense per medical necessity cranial prothesis due to alopecia induced by chemotherapy for breast cancer diagnosis Qty: 1 each, Refills: 1      STOP taking these medications     lidocaine-prilocaine (EMLA) cream      lisinopril-hydrochlorothiazide (PRINZIDE,ZESTORETIC) 20-25 MG per tablet      LORazepam (ATIVAN) 0.5 MG tablet        No Known Allergies Follow-up Information    Follow up with Loura Pardon, MD. Schedule an appointment as soon as possible for a visit in 1 week.   Specialties:  Family Medicine, Radiology   Contact information:   [address] [place] [address] [phone]        The results of significant diagnostics from this  hospitalization (including imaging, microbiology, ancillary and laboratory) are listed below for reference.    Significant Diagnostic Studies: Dg Chest 2 View  09/21/2014   CLINICAL DATA:  Fever and nausea. Abdominal tightness, 2 days duration. Personal history of right breast cancer. Last chemotherapy 09/15/2014.  EXAM: CHEST  2 VIEW  COMPARISON:  09/10/2014  FINDINGS: Heart size is normal. 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 The lungs are clear. The vascularity is normal. Power port inserted from the right internal jugular approach has its tip in the SVC 2 cm above right atrium. No effusions. No bony abnormalities. Surgical clips in the region of the right breast.  IMPRESSION: No active disease.  Unfolded aorta.  Power port.   Electronically Signed   By: Nelson Chimes M.D.   On: 09/21/2014 17:28   Dg Chest Port 1 View  09/10/2014   CLINICAL DATA:  Port-A-Cath placement.  Right breast cancer.  EXAM: PORTABLE CHEST - 1 VIEW  COMPARISON:  None.  FINDINGS: Power injectable right-sided Port-A-Cath noted with tip projecting over the lower SVC. No pneumothorax. Upper normal heart size. Low lung volumes are present, causing crowding of the pulmonary vasculature.  IMPRESSION: 1. Port-A-Cath tip: Lower SVC. No pneumothorax or complicating feature.   Electronically Signed   By: Van Clines M.D.   On: 09/10/2014 10:43   Dg Fluoro Guide Cv Line-no Report  09/10/2014   CLINICAL DATA:    FLOURO GUIDE CV LINE  Fluoroscopy was utilized by the requesting physician.  No radiographic  interpretation.     Microbiology: Recent Results (from the past 240 hour(s))  Blood Culture (routine x 2)     Status: None (Preliminary result)   Collection Time: 09/21/14  5:06 PM  Result Value Ref Range Status   Specimen Description BLOOD LEFT FOREARM  Final   Special Requests BOTTLES DRAWN AEROBIC AND ANAEROBIC 5CC  Final   Culture   Final           BLOOD CULTURE RECEIVED NO GROWTH TO DATE CULTURE WILL BE HELD FOR 5 DAYS BEFORE ISSUING A  FINAL NEGATIVE REPORT Performed at [facility]    Report Status PENDING  Incomplete  Blood Culture (routine x 2)     Status: None (Preliminary result)   Collection Time: 09/21/14  5:11 PM  Result Value Ref Range Status   Specimen Description BLOOD LEFT HAND  Final   Special Requests BOTTLES DRAWN AEROBIC AND ANAEROBIC 5CC  Final   Culture   Final           BLOOD CULTURE RECEIVED NO GROWTH TO DATE CULTURE WILL BE HELD FOR 5 DAYS BEFORE ISSUING A FINAL NEGATIVE REPORT Performed at [facility]    Report Status PENDING  Incomplete  Urine culture     Status: None   Collection Time: 09/21/14  6:09 PM  Result Value Ref Range Status   Specimen Description URINE, CLEAN CATCH  Final   Special Requests NONE  Final   Colony Count   Final    >=100,000 COLONIES/ML Performed at [facility]    Culture   Final    ESCHERICHIA COLI Performed at [facility]    Report Status 09/23/2014 FINAL  Final   Organism ID, Bacteria ESCHERICHIA COLI  Final      Susceptibility   Escherichia coli - MIC*    AMPICILLIN RESISTANT      CEFAZOLIN 8 SENSITIVE Sensitive     CEFTRIAXONE <=1 SENSITIVE Sensitive     CIPROFLOXACIN <=0.25 SENSITIVE Sensitive     GENTAMICIN <=1 SENSITIVE Sensitive     LEVOFLOXACIN <=0.12 SENSITIVE Sensitive     NITROFURANTOIN <=16 SENSITIVE Sensitive     TOBRAMYCIN <=1 SENSITIVE Sensitive     TRIMETH/SULFA <=20 SENSITIVE Sensitive     PIP/TAZO <=4 SENSITIVE Sensitive     * ESCHERICHIA COLI  MRSA PCR Screening     Status: None   Collection Time: 09/22/14 12:00 AM  Result Value Ref Range Status   MRSA by PCR NEGATIVE NEGATIVE Final    Comment:        The GeneXpert MRSA Assay (FDA approved for NASAL specimens only), is one component of a comprehensive MRSA colonization surveillance program. It is not intended to diagnose MRSA infection nor to guide or monitor treatment for MRSA infections.      Labs: Basic Metabolic Panel:  Recent  Labs Lab 09/21/14 1706 09/22/14 0330 09/23/14 0419 09/24/14  0925  NA 128* 133* 135 135  K 4.3 3.6 3.0* 3.5  CL 95* 106 109 103  CO2 23 18* 21* 23  GLUCOSE 133* 131* 95 127*  BUN _0 5*  CREATININE 0.75 0.72 0.60 0.79  CALCIUM 8.6* 7.1* 7.7* 7.9*   Liver Function Tests:  Recent Labs Lab 09/21/14 1706  AST 30  ALT 16  ALKPHOS 71  BILITOT 1.9*  PROT 6.6  ALBUMIN 3.2*    Recent Labs Lab 09/21/14 1706  LIPASE 22   No results for input(s): AMMONIA in the last 168 hours. CBC:  Recent Labs Lab 09/21/14 1706 09/22/14 0330 09/23/14 0419  WBC 1.2* 1.2* 10.7*  NEUTROABS 0.1*  --   --   HGB 10.4* 10.7* 10.2*  HCT 29.8* 31.1* 29.4*  MCV 92.8 93.4 92.7  PLT 190 128* 164   Cardiac Enzymes: No results for input(s): CKTOTAL, CKMB, CKMBINDEX, TROPONINI in the last 168 hours. BNP: BNP (last 3 results) No results for input(s): BNP in the last 8760 hours.  ProBNP (last 3 results) No results for input(s): PROBNP in the last 8760 hours.  CBG: No results for input(s): GLUCAP in the last 168 hours.     SignedHosie Poisson  [facility] 09/24/2014, 11:05 AM

## 2014-09-24 NOTE — Care Management Note (Signed)
Case Management Note  Patient Details  Name: Jamie Dillon MRN: [id number] Date of Birth: 1951-05-02    Expected Discharge Date:  09/24/14               Expected Discharge Plan:  Home/Self Care  In-House Referral:  NA  Discharge planning Services  CM Consult, NA  Post Acute Care Choice:    Choice offered to:  NA  DME Arranged:    DME Agency:     HH Arranged:    HH Agency:     Status of Service:  In process, will continue to follow  Medicare Important Message Given:  Yes Date Medicare IM Given:  09/24/14 Medicare IM give by:  Marney Doctor RN,BSN,NCM Date Additional Medicare IM Given:    Additional Medicare Important Message give by:     If discussed at [place] of Stay Meetings, dates discussed:    Additional Comments:  Lynnell Catalan, RN 09/24/2014, 11:18 AM

## 2014-09-24 NOTE — Progress Notes (Unsigned)
COURTESY NOTE:  Diena 's counts have recovered, the e coli from her urine is pan-Sn; agree w plan to d/c to home, likely on levoquin. She will see [place] again next week before resuming therapy 2 weeks from now.  Greatly appreciate your help to this patient!

## 2014-09-27 LAB — CULTURE, BLOOD (ROUTINE X 2)
Culture: NO GROWTH
Culture: NO GROWTH

## 2014-09-30 ENCOUNTER — Other Ambulatory Visit: Payer: Self-pay | Admitting: *Deleted

## 2014-09-30 DIAGNOSIS — C50511 Malignant neoplasm of lower-outer quadrant of right female breast: Secondary | ICD-10-CM

## 2014-10-01 ENCOUNTER — Ambulatory Visit (HOSPITAL_BASED_OUTPATIENT_CLINIC_OR_DEPARTMENT_OTHER): Payer: Medicare Other | Admitting: Nurse Practitioner

## 2014-10-01 ENCOUNTER — Other Ambulatory Visit (HOSPITAL_BASED_OUTPATIENT_CLINIC_OR_DEPARTMENT_OTHER): Payer: Medicare Other

## 2014-10-01 ENCOUNTER — Encounter: Payer: Self-pay | Admitting: Nurse Practitioner

## 2014-10-01 VITALS — BP 146/72 | HR 84 | Temp 98.3°F | Resp 18 | Ht 61.0 in | Wt 134.9 lb

## 2014-10-01 DIAGNOSIS — C50511 Malignant neoplasm of lower-outer quadrant of right female breast: Secondary | ICD-10-CM

## 2014-10-01 DIAGNOSIS — Z170 Estrogen receptor positive status [ER+]: Secondary | ICD-10-CM | POA: Diagnosis not present

## 2014-10-01 LAB — COMPREHENSIVE METABOLIC PANEL (CC13)
ALT: 25 U/L (ref 0–55)
AST: 19 U/L (ref 5–34)
Albumin: 3.2 g/dL — ABNORMAL LOW (ref 3.5–5.0)
Alkaline Phosphatase: 95 U/L (ref 40–150)
Anion Gap: 10 mEq/L (ref 3–11)
BUN: 13 mg/dL (ref 7.0–26.0)
CALCIUM: 9.9 mg/dL (ref 8.4–10.4)
CHLORIDE: 103 meq/L (ref 98–109)
CO2: 28 mEq/L (ref 22–29)
Creatinine: 0.7 mg/dL (ref 0.6–1.1)
EGFR: 89 mL/min/{1.73_m2} — ABNORMAL LOW (ref >90)
Glucose: 110 mg/dl (ref 70–140)
Potassium: 3.9 mEq/L (ref 3.5–5.1)
SODIUM: 141 meq/L (ref 136–145)
TOTAL PROTEIN: 6.9 g/dL (ref 6.4–8.3)
Total Bilirubin: 0.24 mg/dL (ref 0.20–1.20)

## 2014-10-01 LAB — CBC WITH DIFFERENTIAL/PLATELET
BASO%: 1.4 % (ref 0.0–2.0)
BASOS ABS: 0.1 10*3/uL (ref 0.0–0.1)
EOS%: 0 % (ref 0.0–7.0)
Eosinophils Absolute: 0 10*3/uL (ref 0.0–0.5)
HCT: 34.1 % — ABNORMAL LOW (ref 34.8–46.6)
HEMOGLOBIN: 11.4 g/dL — AB (ref 11.6–15.9)
LYMPH%: 17.6 % (ref 14.0–49.7)
MCH: 31.6 pg (ref 25.1–34.0)
MCHC: 33.5 g/dL (ref 31.5–36.0)
MCV: 94.4 fL (ref 79.5–101.0)
MONO#: 0.6 10*3/uL (ref 0.1–0.9)
MONO%: 7.2 % (ref 0.0–14.0)
NEUT#: 6.3 10*3/uL (ref 1.5–6.5)
NEUT%: 73.8 % (ref 38.4–76.8)
Platelets: 383 10*3/uL (ref 145–400)
RBC: 3.62 10*6/uL — ABNORMAL LOW (ref 3.70–5.45)
RDW: 13.4 % (ref 11.2–14.5)
WBC: 8.5 10*3/uL (ref 3.9–10.3)
lymph#: 1.5 10*3/uL (ref 0.9–3.3)

## 2014-10-01 NOTE — Progress Notes (Signed)
[facility]  [phone]) [phone] [phone]) [phone]     ID: Jamie Dillon DOB: 1950/08/29  MR#: [id number]  LDJ#:[id number]  Patient Care Team: Abner Greenspan, MD as PCP - General Rolm Bookbinder, MD as Consulting Physician (General Surgery) Chauncey Cruel, MD as Consulting Physician (Oncology) Eppie Gibson, MD as Attending Physician (Radiation Oncology) Rockwell Germany, RN as Registered Nurse Mauro Kaufmann, RN as Registered Nurse Holley Bouche, NP as Nurse Practitioner (Nurse Practitioner) OTHER MD:  CHIEF COMPLAINT:  Estrogen receptor positive breast cancer  CURRENT TREATMENT: adjuvant chemotherapy   BREAST CANCER HISTORY: From the original intake note:  [name] had routine screening mammography at [facility] 06/05/2014. The breast density was category B. There was a 6 mm irregular mass in the right breast. On 06/11/2014 the patient underwent right breast ultrasonography at [facility]. This confirmed a 7 mm all her than wide irregular mass in the right blast at the 8:00 position. 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  The patient's subsequent history is as detailed below  INTERVAL HISTORY: Sherrika returns today for follow-up of her breast cancer, accompanied by her husband Shanon Dillon. Today is 17, cycle 1 of 4 planned cycles of cyclophosphamide and docetaxel given every 3 weeks with Neulasta support. She was hospitalized on day 7 because of neutropenic fevers and a UTI. She was released 2 days later.   REVIEW OF SYSTEMS: [name] denies fevers, chills, nausea, vomiting, or changes in bowel or bladder habits. During her admission she had abdominal pain and an anal fissure that was painful,  but neither of these have been issues at this time. Her appetite is working its way back to normal, but she has struggled to keep up with her fluids. She is using xanax instead of ativan for sleep/nerves as she has been on this drug for longer and it works better. A detailed review of systems is otherwise stable.  PAST MEDICAL HISTORY: Past Medical History  Diagnosis Date  . Allergic rhinitis   . HTN (hypertension)   . HLD (hyperlipidemia)   . Tobacco abuse   . Family history of malignant neoplasm of breast   . Family history of other kidney diseases   . Disorder of bone and cartilage, unspecified   . Breast cancer of lower-outer quadrant of right female breast 06/19/2014  . Anxiety   . PONV (postoperative nausea and vomiting)     PAST SURGICAL HISTORY: Past Surgical History  Procedure Laterality Date  . Total vaginal hysterectomy  1996    endometriosis  . Abdominal hysterectomy    . Appendectomy    . Colonoscopy    . Dilation and curettage of uterus    . Breast lumpectomy with needle localization and axillary sentinel lymph node bx Right 07/21/2014    Procedure: BREAST LUMPECTOMY WITH NEEDLE LOCALIZATION AND AXILLARY SENTINEL LYMPH NODE BIOPSY;  Surgeon: Rolm Bookbinder, MD;  Location: [facility];  Service: General;  Laterality: Right;  . Portacath placement Right 09/10/2014    Procedure: INSERTION PORT-A-CATH;  Surgeon: Rolm Bookbinder, MD;  Location: [facility];  Service: General;  Laterality: Right;    FAMILY HISTORY Family History  Problem Relation Age of Onset  . Breast cancer Sister   . Hypertension Mother   .  Kidney disease Father   . Diabetes Paternal Aunt    the patient's father died from kidney failure at the age of 20. The patient's mother is still living at age 52. The patient had no brothers, one sister. That sister was diagnosed with breast cancer at age 60. The patient's father's mother was diagnosed with mouth cancer at the age of 67.  GYNECOLOGIC  HISTORY:  No LMP recorded. Patient has had a hysterectomy. Menarche age 47, first live birth age 55. The patient is GX P2. She had a total abdominal hysterectomy with bilateral salpingo-oophorectomy in 1996. She took hormone replacement for approximately 10 years, until 2009. She also took oral contraceptives for approximately 7 years remotely, with no complications  SOCIAL HISTORY:  The patient is currently retired, though she still works part-time at the [employer] Her husband "Shanon Dillon" Rhyli Depaula is retired from Press [employer]. Son Shanon Dillon "Jamie Dillon" Null teaches English at [employer] in [place]. Daughter Jamie Dillon lives in [place] . She works as a Marine scientist at the [employer]    [place]: In place   HEALTH MAINTENANCE: History  Substance Use Topics  . Smoking status: Former Smoker -- 1.00 packs/day    Types: Cigarettes    Quit date: 06/25/2010  . Smokeless tobacco: Not on file  . Alcohol Use: 0.0 oz/week    0 Standard drinks or equivalent per week     Comment: occasional wine     Colonoscopy: 2010  PAP:  Bone density: December 2015  Lipid panel:  No Known Allergies  Current Outpatient Prescriptions  Medication Sig Dispense Refill  . ALPRAZolam (XANAX) 0.5 MG tablet Take 1 tablet (0.5 mg total) by mouth daily as needed for anxiety. (Patient taking differently: Take 0.5 mg by mouth at bedtime as needed for anxiety. ) 30 tablet 1  . atorvastatin (LIPITOR) 10 MG tablet Take 1 tablet (10 mg total) by mouth daily. 30 tablet 11  . b complex vitamins tablet Take 1 tablet by mouth daily.     . Calcium Carbonate-Vitamin D (CALCIUM-VITAMIN D) 500-200 MG-UNIT per tablet Take 3 tablets by mouth every morning.      . metoprolol succinate (TOPROL-XL) 50 MG 24 hr tablet Take 1 tablet (50 mg total) by mouth daily. Take with or immediately following a meal. 30 tablet 11  . Multiple Vitamin (MULTIVITAMIN) tablet Take 1 tablet by mouth daily.       Marland Kitchen tobramycin-dexamethasone (TOBRADEX) ophthalmic solution Place 1 drop into both eyes 2 (two) times daily. 5 mL 0  . cetirizine (ZYRTEC) 10 MG tablet Take 10 mg by mouth daily as needed for allergies.     Marland Kitchen dexamethasone (DECADRON) 4 MG tablet Take 2 tablets (8 mg total) by mouth 2 (two) times daily. Start the day before Taxotere. Then again the day after chemo for 3 days. (Patient not taking: Reported on 10/01/2014) 30 tablet 1  . diphenhydrAMINE (BENADRYL) 25 MG tablet Take 25 mg by mouth daily as needed for allergies.     . feeding supplement, ENSURE ENLIVE, (ENSURE ENLIVE) LIQD Take 237 mLs by mouth 2 (two) times daily between meals. (Patient not taking: Reported on 10/01/2014) 237 mL 12  . fluticasone (FLONASE) 50 MCG/ACT nasal spray Place 2 sprays into both nostrils daily. (Patient not taking: Reported on 10/01/2014) 16 g 6  . ondansetron (ZOFRAN) 8 MG tablet Take twice a day. Start the evening of chemo day and continue for 3 days. Then take as needed for nausea or  vomiting. (Patient not taking: Reported on 10/01/2014) 30 tablet 1  . oxyCODONE-acetaminophen (PERCOCET) 10-325 MG per tablet Take 1 tablet by mouth every 6 (six) hours as needed for pain. (Patient not taking: Reported on 10/01/2014) 10 tablet 0  . pegfilgrastim (NEULASTA ONPRO KIT) 6 MG/0.6ML injection Inject 6 mg into the skin once. Inject via provided programmed delivery device.    Marland Kitchen PRESCRIPTION MEDICATION Chemo - [facility]    . prochlorperazine (COMPAZINE) 10 MG tablet Take one tablet 4 times a day (before meals and at bedtime) starting the evening of chemo day and continuing for 3 days for nausea (Patient not taking: Reported on 10/01/2014) 30 tablet 1  . UNABLE TO FIND 1 each by Other route once. Dispense per medical necessity cranial prothesis due to alopecia induced by chemotherapy for breast cancer diagnosis (Patient not taking: Reported on 10/01/2014) 1 each 1   No current facility-administered medications for this visit.     OBJECTIVE: Middle-aged white woman in no acute distress Filed Vitals:   10/01/14 1345  BP: 146/72  Pulse: 84  Temp: 98.3 F (36.8 C)  Resp: 18     Body mass index is 25.5 kg/(m^2).    ECOG FS:0 - Asymptomatic  Skin: warm, dry  HEENT: sclerae anicteric, conjunctivae pink, oropharynx clear. No thrush or mucositis.  Lymph Nodes: No cervical or supraclavicular lymphadenopathy  Lungs: clear to auscultation bilaterally, no rales, wheezes, or rhonci  Heart: regular rate and rhythm  Abdomen: round, soft, non tender, positive bowel sounds  Musculoskeletal: No focal spinal tenderness, no peripheral edema  Neuro: non focal, well oriented, positive affect  Breasts: deferred   LAB RESULTS:  CMP     Component Value Date/Time   NA 141 10/01/2014 1335   NA 135 09/24/2014 0925   K 3.9 10/01/2014 1335   K 3.5 09/24/2014 0925   CL 103 09/24/2014 0925   CO2 28 10/01/2014 1335   CO2 23 09/24/2014 0925   GLUCOSE 110 10/01/2014 1335   GLUCOSE 127* 09/24/2014 0925   BUN 13.0 10/01/2014 1335   BUN 5* 09/24/2014 0925   CREATININE 0.7 10/01/2014 1335   CREATININE 0.79 09/24/2014 0925   CALCIUM 9.9 10/01/2014 1335   CALCIUM 7.9* 09/24/2014 0925   PROT 6.9 10/01/2014 1335   PROT 6.6 09/21/2014 1706   ALBUMIN 3.2* 10/01/2014 1335   ALBUMIN 3.2* 09/21/2014 1706   AST 19 10/01/2014 1335   AST 30 09/21/2014 1706   ALT 25 10/01/2014 1335   ALT 16 09/21/2014 1706   ALKPHOS 95 10/01/2014 1335   ALKPHOS 71 09/21/2014 1706   BILITOT 0.24 10/01/2014 1335   BILITOT 1.9* 09/21/2014 1706   GFRNONAA >60 09/24/2014 0925   GFRAA >60 09/24/2014 0925    INo results found for: SPEP, UPEP  Lab Results  Component Value Date   WBC 8.5 10/01/2014   NEUTROABS 6.3 10/01/2014   HGB 11.4* 10/01/2014   HCT 34.1* 10/01/2014   MCV 94.4 10/01/2014   PLT 383 10/01/2014      Chemistry      Component Value Date/Time   NA 141 10/01/2014 1335   NA 135 09/24/2014 0925   K 3.9 10/01/2014 1335   K 3.5  09/24/2014 0925   CL 103 09/24/2014 0925   CO2 28 10/01/2014 1335   CO2 23 09/24/2014 0925   BUN 13.0 10/01/2014 1335   BUN 5* 09/24/2014 0925   CREATININE 0.7 10/01/2014 1335   CREATININE 0.79 09/24/2014 0925      Component Value  Date/Time   CALCIUM 9.9 10/01/2014 1335   CALCIUM 7.9* 09/24/2014 0925   ALKPHOS 95 10/01/2014 1335   ALKPHOS 71 09/21/2014 1706   AST 19 10/01/2014 1335   AST 30 09/21/2014 1706   ALT 25 10/01/2014 1335   ALT 16 09/21/2014 1706   BILITOT 0.24 10/01/2014 1335   BILITOT 1.9* 09/21/2014 1706       No results found for: LABCA2  No components found for: LABCA125  No results for input(s): INR in the last 168 hours.  Urinalysis    Component Value Date/Time   COLORURINE YELLOW 09/21/2014 1809   APPEARANCEUR CLOUDY* 09/21/2014 1809   LABSPEC 1.009 09/21/2014 1809   PHURINE 7.0 09/21/2014 1809   GLUCOSEU NEGATIVE 09/21/2014 1809   HGBUR MODERATE* 09/21/2014 1809   HGBUR negative 02/13/2008 0911   BILIRUBINUR NEGATIVE 09/21/2014 1809   KETONESUR NEGATIVE 09/21/2014 1809   PROTEINUR 30* 09/21/2014 1809   UROBILINOGEN 0.2 09/21/2014 1809   NITRITE POSITIVE* 09/21/2014 1809   LEUKOCYTESUR MODERATE* 09/21/2014 1809    STUDIES: Dg Chest 2 View  09/21/2014   CLINICAL DATA:  Fever and nausea. Abdominal tightness, 2 days duration. Personal history of right breast cancer. Last chemotherapy 09/15/2014.  EXAM: CHEST  2 VIEW  COMPARISON:  09/10/2014  FINDINGS: Heart size is normal. 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 The lungs are clear. The vascularity is normal. Power port inserted from the right internal jugular approach has its tip in the SVC 2 cm above right atrium. No effusions. No bony abnormalities. Surgical clips in the region of the right breast.  IMPRESSION: No active disease.  Unfolded aorta.  Power port.   Electronically Signed   By: Nelson Chimes M.D.   On: 09/21/2014 17:28   Dg Chest Port 1 View  09/10/2014   CLINICAL DATA:  Port-A-Cath placement.  Right  breast cancer.  EXAM: PORTABLE CHEST - 1 VIEW  COMPARISON:  None.  FINDINGS: Power injectable right-sided Port-A-Cath noted with tip projecting over the lower SVC. No pneumothorax. Upper normal heart size. Low lung volumes are present, causing crowding of the pulmonary vasculature.  IMPRESSION: 1. Port-A-Cath tip: Lower SVC. No pneumothorax or complicating feature.   Electronically Signed   By: Van Clines M.D.   On: 09/10/2014 10:43   Dg Fluoro Guide Cv Line-no Report  09/10/2014   CLINICAL DATA:    FLOURO GUIDE CV LINE  Fluoroscopy was utilized by the requesting physician.  No radiographic  interpretation.      ASSESSMENT: 64 y.o. [place] woman status post right breast biopsy 06/17/2014 for a clinical T1b N0, stage I invasive ductal carcinoma, grade 2, estrogen receptor strongly positive, progesterone receptor moderately positive, with no HER-2 amplification and an MIB-1 of 22%.  (1) status post right lumpectomy and right axillary sentinel lymph node sampling 07/21/2014 for a pT1b pN1a, stage IIA invasive ductal carcinoma, grade 1, with negative margins, repeat HER-2/neu again negative.  (2) adjuvant chemotherapy will consist of cyclophosphamide and docetaxel given every 3 weeks with Neulasta support, start date 09/15/2014. Neutropenic fever and hospitalization 5 days after the first cycle. Dose subsequently reduced.  (3) the patient will require adjuvant radiation therapy  (4) anti-estrogens to follow radiation   PLAN: The labs were reviewed in detail and were entirely stable. Her ANC is back up to 6.3 today. She is feeling much improved. We will likely reduce the dose of her treatments as a result of her hospitalization from the first cycle.   I have encouraged her to try flavored  water and teas to help with her fluid intake.  Despite her reservations, Carlise will return next week to start cycle 2 of cyclophosphamide and docetaxel. She understands and agrees with this plan. She  knows the goal of treatment in her case is cure. She has been encouraged to call with any issues that might arise before her next visit here.  Laurie Panda, NP   10/01/2014 2:47 PM

## 2014-10-02 ENCOUNTER — Telehealth: Payer: Self-pay | Admitting: *Deleted

## 2014-10-02 ENCOUNTER — Other Ambulatory Visit: Payer: Self-pay | Admitting: *Deleted

## 2014-10-02 DIAGNOSIS — C50511 Malignant neoplasm of lower-outer quadrant of right female breast: Secondary | ICD-10-CM

## 2014-10-02 NOTE — Telephone Encounter (Signed)
Per staff message and POF I have scheduled appts. Advised scheduler of appts. JMW  

## 2014-10-06 ENCOUNTER — Ambulatory Visit (HOSPITAL_BASED_OUTPATIENT_CLINIC_OR_DEPARTMENT_OTHER): Payer: Medicare Other

## 2014-10-06 ENCOUNTER — Ambulatory Visit (HOSPITAL_BASED_OUTPATIENT_CLINIC_OR_DEPARTMENT_OTHER): Payer: Medicare Other | Admitting: Nurse Practitioner

## 2014-10-06 ENCOUNTER — Other Ambulatory Visit (HOSPITAL_BASED_OUTPATIENT_CLINIC_OR_DEPARTMENT_OTHER): Payer: Medicare Other

## 2014-10-06 ENCOUNTER — Other Ambulatory Visit: Payer: Self-pay

## 2014-10-06 ENCOUNTER — Encounter: Payer: Self-pay | Admitting: *Deleted

## 2014-10-06 ENCOUNTER — Other Ambulatory Visit: Payer: Self-pay | Admitting: Oncology

## 2014-10-06 ENCOUNTER — Encounter: Payer: Self-pay | Admitting: Nurse Practitioner

## 2014-10-06 VITALS — BP 124/56 | HR 91 | Temp 97.7°F | Resp 17 | Ht 61.0 in | Wt 134.8 lb

## 2014-10-06 DIAGNOSIS — Z5189 Encounter for other specified aftercare: Secondary | ICD-10-CM | POA: Diagnosis not present

## 2014-10-06 DIAGNOSIS — Z5111 Encounter for antineoplastic chemotherapy: Secondary | ICD-10-CM | POA: Diagnosis not present

## 2014-10-06 DIAGNOSIS — C50511 Malignant neoplasm of lower-outer quadrant of right female breast: Secondary | ICD-10-CM

## 2014-10-06 DIAGNOSIS — Z170 Estrogen receptor positive status [ER+]: Secondary | ICD-10-CM | POA: Diagnosis not present

## 2014-10-06 LAB — CBC WITH DIFFERENTIAL/PLATELET
BASO%: 0.1 % (ref 0.0–2.0)
BASOS ABS: 0 10*3/uL (ref 0.0–0.1)
EOS ABS: 0 10*3/uL (ref 0.0–0.5)
EOS%: 0 % (ref 0.0–7.0)
HEMATOCRIT: 33.7 % — AB (ref 34.8–46.6)
HGB: 11.4 g/dL — ABNORMAL LOW (ref 11.6–15.9)
LYMPH#: 1.3 10*3/uL (ref 0.9–3.3)
LYMPH%: 9.1 % — ABNORMAL LOW (ref 14.0–49.7)
MCH: 32.6 pg (ref 25.1–34.0)
MCHC: 33.8 g/dL (ref 31.5–36.0)
MCV: 96.3 fL (ref 79.5–101.0)
MONO#: 0.8 10*3/uL (ref 0.1–0.9)
MONO%: 5.7 % (ref 0.0–14.0)
NEUT#: 12.3 10*3/uL — ABNORMAL HIGH (ref 1.5–6.5)
NEUT%: 85.1 % — ABNORMAL HIGH (ref 38.4–76.8)
Platelets: 475 10*3/uL — ABNORMAL HIGH (ref 145–400)
RBC: 3.5 10*6/uL — AB (ref 3.70–5.45)
RDW: 14.1 % (ref 11.2–14.5)
WBC: 14.4 10*3/uL — ABNORMAL HIGH (ref 3.9–10.3)

## 2014-10-06 LAB — COMPREHENSIVE METABOLIC PANEL (CC13)
ALK PHOS: 69 U/L (ref 40–150)
ALT: 21 U/L (ref 0–55)
AST: 15 U/L (ref 5–34)
Albumin: 3.4 g/dL — ABNORMAL LOW (ref 3.5–5.0)
Anion Gap: 14 mEq/L — ABNORMAL HIGH (ref 3–11)
BUN: 21.2 mg/dL (ref 7.0–26.0)
CALCIUM: 9.1 mg/dL (ref 8.4–10.4)
CO2: 21 mEq/L — ABNORMAL LOW (ref 22–29)
Chloride: 105 mEq/L (ref 98–109)
Creatinine: 0.8 mg/dL (ref 0.6–1.1)
EGFR: 78 mL/min/{1.73_m2} — ABNORMAL LOW (ref >90)
Glucose: 145 mg/dl — ABNORMAL HIGH (ref 70–140)
POTASSIUM: 3.8 meq/L (ref 3.5–5.1)
Sodium: 141 mEq/L (ref 136–145)
TOTAL PROTEIN: 6.7 g/dL (ref 6.4–8.3)
Total Bilirubin: 0.34 mg/dL (ref 0.20–1.20)

## 2014-10-06 MED ORDER — SODIUM CHLORIDE 0.9 % IV SOLN
Freq: Once | INTRAVENOUS | Status: AC
  Administered 2014-10-06: 10:00:00 via INTRAVENOUS
  Filled 2014-10-06: qty 8

## 2014-10-06 MED ORDER — SODIUM CHLORIDE 0.9 % IV SOLN
Freq: Once | INTRAVENOUS | Status: AC
  Administered 2014-10-06: 10:00:00 via INTRAVENOUS

## 2014-10-06 MED ORDER — HEPARIN SOD (PORK) LOCK FLUSH 100 UNIT/ML IV SOLN
500.0000 [IU] | Freq: Once | INTRAVENOUS | Status: AC | PRN
  Administered 2014-10-06: 500 [IU]
  Filled 2014-10-06: qty 5

## 2014-10-06 MED ORDER — SODIUM CHLORIDE 0.9 % IJ SOLN
10.0000 mL | INTRAMUSCULAR | Status: DC | PRN
  Administered 2014-10-06: 10 mL
  Filled 2014-10-06: qty 10

## 2014-10-06 MED ORDER — PEGFILGRASTIM 6 MG/0.6ML ~~LOC~~ PSKT
6.0000 mg | PREFILLED_SYRINGE | Freq: Once | SUBCUTANEOUS | Status: AC
  Administered 2014-10-06: 6 mg via SUBCUTANEOUS
  Filled 2014-10-06: qty 0.6

## 2014-10-06 MED ORDER — SODIUM CHLORIDE 0.9 % IV SOLN
500.0000 mg/m2 | Freq: Once | INTRAVENOUS | Status: AC
  Administered 2014-10-06: 800 mg via INTRAVENOUS
  Filled 2014-10-06: qty 40

## 2014-10-06 MED ORDER — DOCETAXEL CHEMO INJECTION 160 MG/16ML
60.0000 mg/m2 | Freq: Once | INTRAVENOUS | Status: AC
  Administered 2014-10-06: 100 mg via INTRAVENOUS
  Filled 2014-10-06: qty 10

## 2014-10-06 NOTE — Progress Notes (Addendum)
[facility]  [phone]) [phone] [phone]) [phone]     ID: Jamie Dillon DOB: 1950-11-07  MR#: [id number]  VAP#:[id number]  Patient Care Team: Abner Greenspan, MD as PCP - General Rolm Bookbinder, MD as Consulting Physician (General Surgery) Chauncey Cruel, MD as Consulting Physician (Oncology) Eppie Gibson, MD as Attending Physician (Radiation Oncology) Rockwell Germany, RN as Registered Nurse Mauro Kaufmann, RN as Registered Nurse Holley Bouche, NP as Nurse Practitioner (Nurse Practitioner) OTHER MD:  CHIEF COMPLAINT:  Estrogen receptor positive breast cancer  CURRENT TREATMENT: adjuvant chemotherapy   BREAST CANCER HISTORY: From the original intake note:  Jamie Dillon had routine screening mammography at [facility] 06/05/2014. The breast density was category B. There was a 6 mm irregular mass in the right breast. On 06/11/2014 the patient underwent right breast ultrasonography at [facility]. This confirmed a 7 mm all her than wide irregular mass in the right blast at the 8:00 position. 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  The patient's subsequent history is as detailed below  INTERVAL HISTORY: Jamie Dillon returns today for follow-up of her breast cancer, accompanied by her husband Jamie Dillon. Today is 1, cycle 2 of 4 planned cycles of cyclophosphamide and docetaxel given every 3 weeks with Neulasta support.   REVIEW OF SYSTEMS: Jamie Dillon denies fevers, chills, nausea, vomiting, or changes in bowel or bladder habits. She complains of some fatigue over the weekend. She denies shortness of breath, chest pain, cough, or palpitations. She has no mouth sores, rashes, or peripheral neuropathy symptoms.  She is eating and drinking well. A detailed review of systems is otherwise stable.  PAST MEDICAL HISTORY: Past Medical History  Diagnosis Date  . Allergic rhinitis   . HTN (hypertension)   . HLD (hyperlipidemia)   . Tobacco abuse   . Family history of malignant neoplasm of breast   . Family history of other kidney diseases   . Disorder of bone and cartilage, unspecified   . Breast cancer of lower-outer quadrant of right female breast 06/19/2014  . Anxiety   . PONV (postoperative nausea and vomiting)     PAST SURGICAL HISTORY: Past Surgical History  Procedure Laterality Date  . Total vaginal hysterectomy  1996    endometriosis  . Abdominal hysterectomy    . Appendectomy    . Colonoscopy    . Dilation and curettage of uterus    . Breast lumpectomy with needle localization and axillary sentinel lymph node bx Right 07/21/2014    Procedure: BREAST LUMPECTOMY WITH NEEDLE LOCALIZATION AND AXILLARY SENTINEL LYMPH NODE BIOPSY;  Surgeon: Rolm Bookbinder, MD;  Location: [facility];  Service: General;  Laterality: Right;  . Portacath placement Right 09/10/2014    Procedure: INSERTION PORT-A-CATH;  Surgeon: Rolm Bookbinder, MD;  Location: [facility];  Service: General;  Laterality: Right;    FAMILY HISTORY Family History  Problem Relation Age of Onset  . Breast cancer Sister   . Hypertension Mother   . Kidney disease Father   . Diabetes Paternal Aunt    the patient's father died from kidney failure at the age of 13. The patient's mother is still living at age 28. The patient had no brothers, one sister. That sister was diagnosed with breast cancer at age  85. The patient's father's mother was diagnosed with mouth cancer at the age of 10.  GYNECOLOGIC HISTORY:  No LMP recorded. Patient has had a hysterectomy. Menarche age 30, first live birth age 14. The patient is GX P2. She had a total abdominal hysterectomy with bilateral salpingo-oophorectomy in 1996. She took hormone  replacement for approximately 10 years, until 2009. She also took oral contraceptives for approximately 7 years remotely, with no complications  SOCIAL HISTORY:  The patient is currently retired, though she still works part-time at the [employer] Her husband "Jamie Dillon" Jamie Dillon is retired from Press [employer]. Son Jamie Dillon "Jamie Cornea" Dillon teaches English at [employer] in [place]. Daughter Jamie Dillon lives in [place] . She works as a Marine scientist at the [employer]    [place]: In place   HEALTH MAINTENANCE: History  Substance Use Topics  . Smoking status: Former Smoker -- 1.00 packs/day    Types: Cigarettes    Quit date: 06/25/2010  . Smokeless tobacco: Not on file  . Alcohol Use: 0.0 oz/week    0 Standard drinks or equivalent per week     Comment: occasional wine     Colonoscopy: 2010  PAP:  Bone density: December 2015  Lipid panel:  No Known Allergies  Current Outpatient Prescriptions  Medication Sig Dispense Refill  . atorvastatin (LIPITOR) 10 MG tablet Take 1 tablet (10 mg total) by mouth daily. 30 tablet 11  . b complex vitamins tablet Take 1 tablet by mouth daily.     . Calcium Carbonate-Vitamin D (CALCIUM-VITAMIN D) 500-200 MG-UNIT per tablet Take 3 tablets by mouth every morning.      Marland Kitchen dexamethasone (DECADRON) 4 MG tablet Take 2 tablets (8 mg total) by mouth 2 (two) times daily. Start the day before Taxotere. Then again the day after chemo for 3 days. 30 tablet 1  . diphenhydrAMINE (BENADRYL) 25 MG tablet Take 25 mg by mouth daily as needed for allergies.     . metoprolol succinate (TOPROL-XL) 50 MG 24 hr tablet Take 1 tablet (50 mg total) by mouth daily. Take with or immediately following a meal. 30 tablet 11  . Multiple Vitamin (MULTIVITAMIN) tablet Take 1 tablet by mouth daily.      . ondansetron (ZOFRAN) 8 MG tablet Take twice a day. Start the evening of chemo day and continue for 3 days. Then take as needed for nausea or  vomiting. 30 tablet 1  . pegfilgrastim (NEULASTA ONPRO KIT) 6 MG/0.6ML injection Inject 6 mg into the skin once. Inject via provided programmed delivery device.    Marland Kitchen PRESCRIPTION MEDICATION Chemo - [facility]    . prochlorperazine (COMPAZINE) 10 MG tablet Take one tablet 4 times a day (before meals and at bedtime) starting the evening of chemo day and continuing for 3 days for nausea 30 tablet 1  . tobramycin-dexamethasone (TOBRADEX) ophthalmic solution Place 1 drop into both eyes 2 (two) times daily. 5 mL 0  . UNABLE TO FIND 1 each by Other route once. Dispense per medical necessity cranial prothesis due to alopecia induced by chemotherapy for breast cancer diagnosis 1 each 1  . ALPRAZolam (XANAX) 0.5 MG tablet Take 1 tablet (0.5 mg total) by mouth daily as needed for anxiety. (Patient not taking: Reported on 10/06/2014) 30 tablet 1  . cetirizine (ZYRTEC) 10 MG tablet Take 10 mg by mouth daily as needed for allergies.     . feeding supplement, ENSURE ENLIVE, (ENSURE ENLIVE) LIQD Take 237 mLs by mouth 2 (  two) times daily between meals. (Patient not taking: Reported on 10/01/2014) 237 mL 12  . fluticasone (FLONASE) 50 MCG/ACT nasal spray Place 2 sprays into both nostrils daily. (Patient not taking: Reported on 10/01/2014) 16 g 6  . oxyCODONE-acetaminophen (PERCOCET) 10-325 MG per tablet Take 1 tablet by mouth every 6 (six) hours as needed for pain. (Patient not taking: Reported on 10/01/2014) 10 tablet 0   No current facility-administered medications for this visit.    OBJECTIVE: Middle-aged white woman in no acute distress Filed Vitals:   10/06/14 0908  BP:   Pulse: 91  Temp:   Resp:      Body mass index is 25.48 kg/(m^2).    ECOG FS:0 - Asymptomatic  Sclerae unicteric, pupils round and equal Oropharynx clear and moist-- no thrush or other lesions No cervical or supraclavicular adenopathy Lungs no rales or rhonchi Heart regular rate and rhythm Abd soft, nontender, positive bowel sounds MSK no  focal spinal tenderness, no upper extremity lymphedema Neuro: nonfocal, well oriented, appropriate affect Breasts: deferred  LAB RESULTS:  CMP     Component Value Date/Time   NA 141 10/06/2014 0830   NA 135 09/24/2014 0925   K 3.8 10/06/2014 0830   K 3.5 09/24/2014 0925   CL 103 09/24/2014 0925   CO2 21* 10/06/2014 0830   CO2 23 09/24/2014 0925   GLUCOSE 145* 10/06/2014 0830   GLUCOSE 127* 09/24/2014 0925   BUN 21.2 10/06/2014 0830   BUN 5* 09/24/2014 0925   CREATININE 0.8 10/06/2014 0830   CREATININE 0.79 09/24/2014 0925   CALCIUM 9.1 10/06/2014 0830   CALCIUM 7.9* 09/24/2014 0925   PROT 6.7 10/06/2014 0830   PROT 6.6 09/21/2014 1706   ALBUMIN 3.4* 10/06/2014 0830   ALBUMIN 3.2* 09/21/2014 1706   AST 15 10/06/2014 0830   AST 30 09/21/2014 1706   ALT 21 10/06/2014 0830   ALT 16 09/21/2014 1706   ALKPHOS 69 10/06/2014 0830   ALKPHOS 71 09/21/2014 1706   BILITOT 0.34 10/06/2014 0830   BILITOT 1.9* 09/21/2014 1706   GFRNONAA >60 09/24/2014 0925   GFRAA >60 09/24/2014 0925    INo results found for: SPEP, UPEP  Lab Results  Component Value Date   WBC 14.4* 10/06/2014   NEUTROABS 12.3* 10/06/2014   HGB 11.4* 10/06/2014   HCT 33.7* 10/06/2014   MCV 96.3 10/06/2014   PLT 475* 10/06/2014      Chemistry      Component Value Date/Time   NA 141 10/06/2014 0830   NA 135 09/24/2014 0925   K 3.8 10/06/2014 0830   K 3.5 09/24/2014 0925   CL 103 09/24/2014 0925   CO2 21* 10/06/2014 0830   CO2 23 09/24/2014 0925   BUN 21.2 10/06/2014 0830   BUN 5* 09/24/2014 0925   CREATININE 0.8 10/06/2014 0830   CREATININE 0.79 09/24/2014 0925      Component Value Date/Time   CALCIUM 9.1 10/06/2014 0830   CALCIUM 7.9* 09/24/2014 0925   ALKPHOS 69 10/06/2014 0830   ALKPHOS 71 09/21/2014 1706   AST 15 10/06/2014 0830   AST 30 09/21/2014 1706   ALT 21 10/06/2014 0830   ALT 16 09/21/2014 1706   BILITOT 0.34 10/06/2014 0830   BILITOT 1.9* 09/21/2014 1706       No results  found for: LABCA2  No components found for: LABCA125  No results for input(s): INR in the last 168 hours.  Urinalysis    Component Value Date/Time   COLORURINE YELLOW 09/21/2014  1809   APPEARANCEUR CLOUDY* 09/21/2014 1809   LABSPEC 1.009 09/21/2014 1809   PHURINE 7.0 09/21/2014 1809   GLUCOSEU NEGATIVE 09/21/2014 1809   HGBUR MODERATE* 09/21/2014 1809   HGBUR negative 02/13/2008 0911   BILIRUBINUR NEGATIVE 09/21/2014 1809   KETONESUR NEGATIVE 09/21/2014 1809   PROTEINUR 30* 09/21/2014 1809   UROBILINOGEN 0.2 09/21/2014 1809   NITRITE POSITIVE* 09/21/2014 1809   LEUKOCYTESUR MODERATE* 09/21/2014 1809    STUDIES: Dg Chest 2 View  09/21/2014   CLINICAL DATA:  Fever and nausea. Abdominal tightness, 2 days duration. Personal history of right breast cancer. Last chemotherapy 09/15/2014.  EXAM: CHEST  2 VIEW  COMPARISON:  09/10/2014  FINDINGS: Heart size is normal. 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 The lungs are clear. The vascularity is normal. Power port inserted from the right internal jugular approach has its tip in the SVC 2 cm above right atrium. No effusions. No bony abnormalities. Surgical clips in the region of the right breast.  IMPRESSION: No active disease.  Unfolded aorta.  Power port.   Electronically Signed   By: Nelson Chimes M.D.   On: 09/21/2014 17:28   Dg Chest Port 1 View  09/10/2014   CLINICAL DATA:  Port-A-Cath placement.  Right breast cancer.  EXAM: PORTABLE CHEST - 1 VIEW  COMPARISON:  None.  FINDINGS: Power injectable right-sided Port-A-Cath noted with tip projecting over the lower SVC. No pneumothorax. Upper normal heart size. Low lung volumes are present, causing crowding of the pulmonary vasculature.  IMPRESSION: 1. Port-A-Cath tip: Lower SVC. No pneumothorax or complicating feature.   Electronically Signed   By: Van Clines M.D.   On: 09/10/2014 10:43   Dg Fluoro Guide Cv Line-no Report  09/10/2014   CLINICAL DATA:    FLOURO GUIDE CV LINE  Fluoroscopy was utilized by  the requesting physician.  No radiographic  interpretation.      ASSESSMENT: 64 y.o.  woman status post right breast biopsy 06/17/2014 for a clinical T1b N0, stage I invasive ductal carcinoma, grade 2, estrogen receptor strongly positive, progesterone receptor moderately positive, with no HER-2 amplification and an MIB-1 of 22%.  (1) status post right lumpectomy and right axillary sentinel lymph node sampling 07/21/2014 for a pT1b pN1a, stage IIA invasive ductal carcinoma, grade 1, with negative margins, repeat HER-2/neu again negative.  (2) adjuvant chemotherapy will consist of cyclophosphamide and docetaxel given every 3 weeks with Neulasta support, start date 09/15/2014. Neutropenic fever and hospitalization 5 days after the first cycle. Dose subsequently reduced.  (3) the patient will require adjuvant radiation therapy  (4) anti-estrogens to follow radiation   PLAN: Leza is understandably nervous today, but otherwise feels fine. The labs were reviewed in detail and were entirely stable. She will proceed with cycle 2 of docetaxel and cyclophosphamide. This will be dose reduced.    She will return in 1 week for labs and a nadir visit. She understands and agrees with this plan. She knows the goal of treatment in her case is cure. She has been encouraged to call with any issues that might arise before her next visit here.   Laurie Panda, NP   10/06/2014 9:09 AM

## 2014-10-06 NOTE — Patient Instructions (Signed)
[facility] Discharge Instructions for Patients Receiving Chemotherapy  Today you received the following chemotherapy agents Taxotere and Cytoxan.  To help prevent nausea and vomiting after your treatment, we encourage you to take your nausea medication as prescribed.   If you develop nausea and vomiting that is not controlled by your nausea medication, call the clinic.   BELOW ARE SYMPTOMS THAT SHOULD BE REPORTED IMMEDIATELY:  *FEVER GREATER THAN 100.5 F  *CHILLS WITH OR WITHOUT FEVER  NAUSEA AND VOMITING THAT IS NOT CONTROLLED WITH YOUR NAUSEA MEDICATION  *UNUSUAL SHORTNESS OF BREATH  *UNUSUAL BRUISING OR BLEEDING  TENDERNESS IN MOUTH AND THROAT WITH OR WITHOUT PRESENCE OF ULCERS  *URINARY PROBLEMS  *BOWEL PROBLEMS  UNUSUAL RASH Items with * indicate a potential emergency and should be followed up as soon as possible.  Feel free to call the clinic you have any questions or concerns. The clinic phone number is [phone].  Please show the [facility] at check-in to the Emergency Department and triage nurse.

## 2014-10-06 NOTE — Progress Notes (Signed)
Met with pt during cycle 2 AC. Relate she was "dreading" coming in d/t back experience from s/e from cycle 1. Discussed anti-nausea meds and gave emotional support and encouragement. Request pt call with needs or questions. Received verbal understanding.

## 2014-10-07 ENCOUNTER — Ambulatory Visit: Payer: Self-pay

## 2014-10-09 NOTE — Addendum Note (Signed)
Addended by: Marcelino Duster on: 10/09/2014 12:06 PM   Modules accepted: Orders

## 2014-10-10 ENCOUNTER — Encounter: Payer: Self-pay | Admitting: General Practice

## 2014-10-10 NOTE — Progress Notes (Signed)
Spiritual Care Note  Followed up by phone after seeing [facility] inpatient when she was admitted for neutropenic fever.  Today she reported that her main struggle is with tiredness from chemo, noticing how much her life has changed just from the lack of energy.  Overall, she reports good family support, better spirits, and better experience with chemo.  Provided reflective listening, normalization of feelings, affirmation of strengths, and encouragement.  Reminded her of ongoing chaplain availability for support.  Please also page as needs arise.  [place], [place]

## 2014-10-14 ENCOUNTER — Encounter: Payer: Self-pay | Admitting: Nurse Practitioner

## 2014-10-14 ENCOUNTER — Ambulatory Visit (HOSPITAL_BASED_OUTPATIENT_CLINIC_OR_DEPARTMENT_OTHER): Payer: Medicare Other | Admitting: Nurse Practitioner

## 2014-10-14 ENCOUNTER — Other Ambulatory Visit (HOSPITAL_BASED_OUTPATIENT_CLINIC_OR_DEPARTMENT_OTHER): Payer: Medicare Other

## 2014-10-14 VITALS — BP 119/80 | HR 91 | Temp 98.0°F | Resp 18 | Ht 61.0 in | Wt 132.6 lb

## 2014-10-14 DIAGNOSIS — Z170 Estrogen receptor positive status [ER+]: Secondary | ICD-10-CM

## 2014-10-14 DIAGNOSIS — C50511 Malignant neoplasm of lower-outer quadrant of right female breast: Secondary | ICD-10-CM

## 2014-10-14 LAB — CBC WITH DIFFERENTIAL/PLATELET
BASO%: 0.6 % (ref 0.0–2.0)
Basophils Absolute: 0.4 10*3/uL — ABNORMAL HIGH (ref 0.0–0.1)
EOS%: 0.1 % (ref 0.0–7.0)
Eosinophils Absolute: 0.1 10*3/uL (ref 0.0–0.5)
HCT: 34 % — ABNORMAL LOW (ref 34.8–46.6)
HEMOGLOBIN: 11.6 g/dL (ref 11.6–15.9)
LYMPH%: 4.2 % — ABNORMAL LOW (ref 14.0–49.7)
MCH: 32.5 pg (ref 25.1–34.0)
MCHC: 34.1 g/dL (ref 31.5–36.0)
MCV: 95.2 fL (ref 79.5–101.0)
MONO#: 2.6 10*3/uL — AB (ref 0.1–0.9)
MONO%: 4.7 % (ref 0.0–14.0)
NEUT#: 50.9 10*3/uL — ABNORMAL HIGH (ref 1.5–6.5)
NEUT%: 90.4 % — ABNORMAL HIGH (ref 38.4–76.8)
Platelets: 315 10*3/uL (ref 145–400)
RBC: 3.57 10*6/uL — ABNORMAL LOW (ref 3.70–5.45)
RDW: 14.3 % (ref 11.2–14.5)
WBC: 56.3 10*3/uL (ref 3.9–10.3)
lymph#: 2.4 10*3/uL (ref 0.9–3.3)
nRBC: 0 % (ref 0–0)

## 2014-10-14 LAB — COMPREHENSIVE METABOLIC PANEL (CC13)
ALT: 24 U/L (ref 0–55)
AST: 26 U/L (ref 5–34)
Albumin: 3.2 g/dL — ABNORMAL LOW (ref 3.5–5.0)
Alkaline Phosphatase: 122 U/L (ref 40–150)
Anion Gap: 10 mEq/L (ref 3–11)
BILIRUBIN TOTAL: 0.26 mg/dL (ref 0.20–1.20)
BUN: 11.8 mg/dL (ref 7.0–26.0)
CO2: 24 meq/L (ref 22–29)
Calcium: 9.1 mg/dL (ref 8.4–10.4)
Chloride: 103 mEq/L (ref 98–109)
Creatinine: 0.8 mg/dL (ref 0.6–1.1)
EGFR: 76 mL/min/{1.73_m2} — ABNORMAL LOW (ref >90)
Glucose: 114 mg/dl (ref 70–140)
Potassium: 3.6 mEq/L (ref 3.5–5.1)
Sodium: 137 mEq/L (ref 136–145)
TOTAL PROTEIN: 6.4 g/dL (ref 6.4–8.3)

## 2014-10-14 NOTE — Progress Notes (Signed)
[place]  [phone] [phone]) [phone]     ID: Jamie Dillon DOB: Aug 17, 1950  MR#: [id number]  KCL#:[id number]  Patient Care Team: Abner Greenspan, MD as PCP - General Rolm Bookbinder, MD as Consulting Physician (General Surgery) Chauncey Cruel, MD as Consulting Physician (Oncology) Eppie Gibson, MD as Attending Physician (Radiation Oncology) Rockwell Germany, RN as Registered Nurse Mauro Kaufmann, RN as Registered Nurse Holley Bouche, NP as Nurse Practitioner (Nurse Practitioner) OTHER MD:  CHIEF COMPLAINT:  Estrogen receptor positive breast cancer  CURRENT TREATMENT: adjuvant chemotherapy   BREAST CANCER HISTORY: From the original intake note:  Jamie Dillon had routine screening mammography at [facility] 06/05/2014. The breast density was category B. There was a 6 mm irregular mass in the right breast. On 06/11/2014 the patient underwent right breast ultrasonography at [facility]. This confirmed a 7 mm all her than wide irregular mass in the right blast at the 8:00 position. 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  The patient's subsequent history is as detailed below  INTERVAL HISTORY: Jamie Dillon returns today for follow-up of her breast cancer, accompanied by her husband Jamie Dillon. Today is 8, cycle 2 of 4 planned cycles of cyclophosphamide and docetaxel given every 3 weeks with Neulasta support.   REVIEW OF SYSTEMS: Maeley denies fevers, chills, or changes in bowel or bladder habits. Her nausea was minimal. Saturday and Sunday were her "bad days" where her appetite and energy levels were low, but they began to improve yesterday. She had just mild joint aches from the neulasta, but  this improved some after walking on the treadmill. She has been holding her BP meds, and her pressures have been normal. She feels dizzy when she takes them. A detailed review of systems is otherwise stable.  PAST MEDICAL HISTORY: Past Medical History  Diagnosis Date  . Allergic rhinitis   . HTN (hypertension)   . HLD (hyperlipidemia)   . Tobacco abuse   . Family history of malignant neoplasm of breast   . Family history of other kidney diseases   . Disorder of bone and cartilage, unspecified   . Breast cancer of lower-outer quadrant of right female breast 06/19/2014  . Anxiety   . PONV (postoperative nausea and vomiting)     PAST SURGICAL HISTORY: Past Surgical History  Procedure Laterality Date  . Total vaginal hysterectomy  1996    endometriosis  . Abdominal hysterectomy    . Appendectomy    . Colonoscopy    . Dilation and curettage of uterus    . Breast lumpectomy with needle localization and axillary sentinel lymph node bx Right 07/21/2014    Procedure: BREAST LUMPECTOMY WITH NEEDLE LOCALIZATION AND AXILLARY SENTINEL LYMPH NODE BIOPSY;  Surgeon: Rolm Bookbinder, MD;  Location: [place];  Service: General;  Laterality: Right;  . Portacath placement Right 09/10/2014    Procedure: INSERTION PORT-A-CATH;  Surgeon: Rolm Bookbinder, MD;  Location: [place];  Service: General;  Laterality: Right;    FAMILY HISTORY Family History  Problem Relation Age of Onset  . Breast cancer Sister   . Hypertension Mother   . Kidney disease Father   . Diabetes Paternal Aunt    the patient's father died from kidney failure at the age  of 92. The patient's mother is still living at age 27. The patient had no brothers, one sister. That sister was diagnosed with breast cancer at age 62. The patient's father's mother was diagnosed with mouth cancer at the age of 68.  GYNECOLOGIC HISTORY:  No LMP recorded. Patient has had a hysterectomy. Menarche age 74, first live birth age 42. The  patient is GX P2. She had a total abdominal hysterectomy with bilateral salpingo-oophorectomy in 1996. She took hormone replacement for approximately 10 years, until 2009. She also took oral contraceptives for approximately 7 years remotely, with no complications  SOCIAL HISTORY:  The patient is currently retired, though she still works part-time at the [employer] Her husband "Jamie Dillon" Ciin Brazzel is retired from Press photographer. Son Jamie Dillon "Jamie Dillon" Spittler teaches English at [employer] in [place]. Daughter Jamie Dillon lives in [place] . She works as a Marine scientist at the [employer]    [place]: In place   HEALTH MAINTENANCE: History  Substance Use Topics  . Smoking status: Former Smoker -- 1.00 packs/day    Types: Cigarettes    Quit date: 06/25/2010  . Smokeless tobacco: Not on file  . Alcohol Use: 0.0 oz/week    0 Standard drinks or equivalent per week     Comment: occasional wine     Colonoscopy: 2010  PAP:  Bone density: December 2015  Lipid panel:  No Known Allergies  Current Outpatient Prescriptions  Medication Sig Dispense Refill  . ALPRAZolam (XANAX) 0.5 MG tablet Take 1 tablet (0.5 mg total) by mouth daily as needed for anxiety. 30 tablet 1  . atorvastatin (LIPITOR) 10 MG tablet Take 1 tablet (10 mg total) by mouth daily. 30 tablet 11  . b complex vitamins tablet Take 1 tablet by mouth daily.     . Calcium Carbonate-Vitamin D (CALCIUM-VITAMIN D) 500-200 MG-UNIT per tablet Take 3 tablets by mouth every morning.      Marland Kitchen dexamethasone (DECADRON) 4 MG tablet Take 2 tablets (8 mg total) by mouth 2 (two) times daily. Start the day before Taxotere. Then again the day after chemo for 3 days. 30 tablet 1  . feeding supplement, ENSURE ENLIVE, (ENSURE ENLIVE) LIQD Take 237 mLs by mouth 2 (two) times daily between meals. 237 mL 12  . fluticasone (FLONASE) 50 MCG/ACT nasal spray Place 2 sprays into both nostrils daily. 16 g 6  . metoprolol  succinate (TOPROL-XL) 50 MG 24 hr tablet Take 1 tablet (50 mg total) by mouth daily. Take with or immediately following a meal. 30 tablet 11  . Multiple Vitamin (MULTIVITAMIN) tablet Take 1 tablet by mouth daily.      . ondansetron (ZOFRAN) 8 MG tablet Take twice a day. Start the evening of chemo day and continue for 3 days. Then take as needed for nausea or vomiting. 30 tablet 1  . pegfilgrastim (NEULASTA ONPRO KIT) 6 MG/0.6ML injection Inject 6 mg into the skin once. Inject via provided programmed delivery device.    Marland Kitchen PRESCRIPTION MEDICATION Chemo - [facility]    . prochlorperazine (COMPAZINE) 10 MG tablet Take one tablet 4 times a day (before meals and at bedtime) starting the evening of chemo day and continuing for 3 days for nausea 30 tablet 1  . tobramycin-dexamethasone (TOBRADEX) ophthalmic solution Place 1 drop into both eyes 2 (two) times daily. 5 mL 0  . UNABLE TO FIND 1 each by Other route once. Dispense per medical necessity cranial prothesis due to alopecia induced by chemotherapy  for breast cancer diagnosis 1 each 1  . cetirizine (ZYRTEC) 10 MG tablet Take 10 mg by mouth daily as needed for allergies.     . diphenhydrAMINE (BENADRYL) 25 MG tablet Take 25 mg by mouth daily as needed for allergies.     Marland Kitchen lisinopril-hydrochlorothiazide (PRINZIDE,ZESTORETIC) 20-25 MG per tablet Take 1 tablet by mouth daily.     Marland Kitchen oxyCODONE-acetaminophen (PERCOCET) 10-325 MG per tablet Take 1 tablet by mouth every 6 (six) hours as needed for pain. (Patient not taking: Reported on 10/01/2014) 10 tablet 0   No current facility-administered medications for this visit.    OBJECTIVE: Middle-aged white woman in no acute distress Filed Vitals:   10/14/14 1418  BP: 119/80  Pulse: 91  Temp: 98 F (36.7 C)  Resp: 18     Body mass index is 25.07 kg/(m^2).    ECOG FS:0 - Asymptomatic  Skin: warm, dry  HEENT: sclerae anicteric, conjunctivae pink, oropharynx clear. No thrush or mucositis.  Lymph Nodes: No cervical  or supraclavicular lymphadenopathy  Lungs: clear to auscultation bilaterally, no rales, wheezes, or rhonci  Heart: regular rate and rhythm  Abdomen: round, soft, non tender, positive bowel sounds  Musculoskeletal: No focal spinal tenderness, no peripheral edema  Neuro: non focal, well oriented, positive affect  Breasts: deferred  LAB RESULTS:  CMP     Component Value Date/Time   NA 137 10/14/2014 1358   NA 135 09/24/2014 0925   K 3.6 10/14/2014 1358   K 3.5 09/24/2014 0925   CL 103 09/24/2014 0925   CO2 24 10/14/2014 1358   CO2 23 09/24/2014 0925   GLUCOSE 114 10/14/2014 1358   GLUCOSE 127* 09/24/2014 0925   BUN 11.8 10/14/2014 1358   BUN 5* 09/24/2014 0925   CREATININE 0.8 10/14/2014 1358   CREATININE 0.79 09/24/2014 0925   CALCIUM 9.1 10/14/2014 1358   CALCIUM 7.9* 09/24/2014 0925   PROT 6.4 10/14/2014 1358   PROT 6.6 09/21/2014 1706   ALBUMIN 3.2* 10/14/2014 1358   ALBUMIN 3.2* 09/21/2014 1706   AST 26 10/14/2014 1358   AST 30 09/21/2014 1706   ALT 24 10/14/2014 1358   ALT 16 09/21/2014 1706   ALKPHOS 122 10/14/2014 1358   ALKPHOS 71 09/21/2014 1706   BILITOT 0.26 10/14/2014 1358   BILITOT 1.9* 09/21/2014 1706   GFRNONAA >60 09/24/2014 0925   GFRAA >60 09/24/2014 0925    INo results found for: SPEP, UPEP  Lab Results  Component Value Date   WBC 56.3* 10/14/2014   NEUTROABS 50.9* 10/14/2014   HGB 11.6 10/14/2014   HCT 34.0* 10/14/2014   MCV 95.2 10/14/2014   PLT 315 10/14/2014      Chemistry      Component Value Date/Time   NA 137 10/14/2014 1358   NA 135 09/24/2014 0925   K 3.6 10/14/2014 1358   K 3.5 09/24/2014 0925   CL 103 09/24/2014 0925   CO2 24 10/14/2014 1358   CO2 23 09/24/2014 0925   BUN 11.8 10/14/2014 1358   BUN 5* 09/24/2014 0925   CREATININE 0.8 10/14/2014 1358   CREATININE 0.79 09/24/2014 0925      Component Value Date/Time   CALCIUM 9.1 10/14/2014 1358   CALCIUM 7.9* 09/24/2014 0925   ALKPHOS 122 10/14/2014 1358   ALKPHOS 71  09/21/2014 1706   AST 26 10/14/2014 1358   AST 30 09/21/2014 1706   ALT 24 10/14/2014 1358   ALT 16 09/21/2014 1706   BILITOT 0.26 10/14/2014 1358  BILITOT 1.9* 09/21/2014 1706       No results found for: LABCA2  No components found for: [id number]  No results for input(s): INR in the last 168 hours.  Urinalysis    Component Value Date/Time   COLORURINE YELLOW 09/21/2014 1809   APPEARANCEUR CLOUDY* 09/21/2014 1809   LABSPEC 1.009 09/21/2014 1809   PHURINE 7.0 09/21/2014 1809   GLUCOSEU NEGATIVE 09/21/2014 1809   HGBUR MODERATE* 09/21/2014 1809   HGBUR negative 02/13/2008 0911   BILIRUBINUR NEGATIVE 09/21/2014 1809   KETONESUR NEGATIVE 09/21/2014 1809   PROTEINUR 30* 09/21/2014 1809   UROBILINOGEN 0.2 09/21/2014 1809   NITRITE POSITIVE* 09/21/2014 1809   LEUKOCYTESUR MODERATE* 09/21/2014 1809    STUDIES: Dg Chest 2 View  09/21/2014   CLINICAL DATA:  Fever and nausea. Abdominal tightness, 2 days duration. Personal history of right breast cancer. Last chemotherapy 09/15/2014.  EXAM: CHEST  2 VIEW  COMPARISON:  09/10/2014  FINDINGS: Heart size is normal. 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 The lungs are clear. The vascularity is normal. Power port inserted from the right internal jugular approach has its tip in the SVC 2 cm above right atrium. No effusions. No bony abnormalities. Surgical clips in the region of the right breast.  IMPRESSION: No active disease.  Unfolded aorta.  Power port.   Electronically Signed   By: Nelson Chimes M.D.   On: 09/21/2014 17:28     ASSESSMENT: 64 y.o. [place] woman status post right breast biopsy 06/17/2014 for a clinical T1b N0, stage I invasive ductal carcinoma, grade 2, estrogen receptor strongly positive, progesterone receptor moderately positive, with no HER-2 amplification and an MIB-1 of 22%.  (1) status post right lumpectomy and right axillary sentinel lymph node sampling 07/21/2014 for a pT1b pN1a, stage IIA invasive ductal carcinoma, grade 1, with  negative margins, repeat HER-2/neu again negative.  (2) adjuvant chemotherapy will consist of cyclophosphamide and docetaxel given every 3 weeks with Neulasta support, start date 09/15/2014. Neutropenic fever and hospitalization 5 days after the first cycle. Dose subsequently reduced.  (3) the patient will require adjuvant radiation therapy  (4) anti-estrogens to follow radiation   PLAN: Jamie Dillon tolerated her 2nd treatment remarkably well. The labs were reviewed in detail and her [place] is particularly high this week, as a result of the neulasta given last week on day 2. She is afebrile and denies other symptoms of infection.  She meets tomorrow with her PCP to discuss her blood pressure meds. I imagine they will be discontinued.   Jamie Dillon will return in 2 weeks for the start of cycle 2. She understands and agrees with this plan. She knows the goal of treatment in her case is cure. She has been encouraged to call with any issues that might arise before her next visit here.    Jamie Panda, NP   10/14/2014 3:22 PM

## 2014-10-15 ENCOUNTER — Telehealth: Payer: Self-pay | Admitting: Nurse Practitioner

## 2014-10-15 ENCOUNTER — Ambulatory Visit (INDEPENDENT_AMBULATORY_CARE_PROVIDER_SITE_OTHER): Payer: Medicare Other | Admitting: Family Medicine

## 2014-10-15 ENCOUNTER — Encounter: Payer: Self-pay | Admitting: Family Medicine

## 2014-10-15 VITALS — BP 118/72 | HR 89 | Temp 99.0°F | Ht 61.0 in | Wt 134.0 lb

## 2014-10-15 DIAGNOSIS — I10 Essential (primary) hypertension: Secondary | ICD-10-CM

## 2014-10-15 DIAGNOSIS — N3000 Acute cystitis without hematuria: Secondary | ICD-10-CM

## 2014-10-15 LAB — POCT URINALYSIS DIPSTICK
Bilirubin, UA: NEGATIVE
Glucose, UA: NEGATIVE
KETONES UA: NEGATIVE
LEUKOCYTES UA: NEGATIVE
Nitrite, UA: NEGATIVE
PH UA: 6
PROTEIN UA: NEGATIVE
RBC UA: NEGATIVE
Urobilinogen, UA: 0.2

## 2014-10-15 NOTE — Progress Notes (Signed)
Pre visit review using our clinic review tool, if applicable. No additional management support is needed unless otherwise documented below in the visit note. 

## 2014-10-15 NOTE — Telephone Encounter (Signed)
Chemo added to 7/5 per pof   anne

## 2014-10-15 NOTE — Progress Notes (Signed)
Subjective:    Patient ID: Jamie Dillon, female    DOB: 1951/03/03, 64 y.o.   MRN: [id number]  HPI Here for f/u of BP   Was hosp for neutropenic fever 5/8- 5/11  ecoli uti- treated and done with abx/no symptoms  No fever (though 99 temp here today- after coming in from hot)   Neulasta chemo- 2nd treatment-tolerated better Lower dose this time Planned for total of 4 doses  For breast cancer   No further urinary symptoms at all  No frequency or urgency or hematuria No bladder pain or dysuria  Results for orders placed or performed in visit on 10/15/14  POCT urinalysis dipstick  Result Value Ref Range   Color, UA Yellow    Clarity, UA Clear    Glucose, UA Neg.    Bilirubin, UA Neg.    Ketones, UA Neg.    Spec Grav, UA <=1.005    Blood, UA Neg.    pH, UA 6.0    Protein, UA Neg.    Urobilinogen, UA 0.2    Nitrite, UA Neg.    Leukocytes, UA Negative      Results for orders placed or performed in visit on 10/14/14  CBC with Differential  Result Value Ref Range   WBC 56.3 (HH) 3.9 - 10.3 10e3/uL   NEUT# 50.9 (H) 1.5 - 6.5 10e3/uL   HGB 11.6 11.6 - 15.9 g/dL   HCT 34.0 (L) 34.8 - 46.6 %   Platelets 315 145 - 400 10e3/uL   MCV 95.2 79.5 - 101.0 fL   MCH 32.5 25.1 - 34.0 pg   MCHC 34.1 31.5 - 36.0 g/dL   RBC 3.57 (L) 3.70 - 5.45 10e6/uL   RDW 14.3 11.2 - 14.5 %   lymph# 2.4 0.9 - 3.3 10e3/uL   MONO# 2.6 (H) 0.1 - 0.9 10e3/uL   Eosinophils Absolute 0.1 0.0 - 0.5 10e3/uL   Basophils Absolute 0.4 (H) 0.0 - 0.1 10e3/uL   NEUT% 90.4 (H) 38.4 - 76.8 %   LYMPH% 4.2 (L) 14.0 - 49.7 %   MONO% 4.7 0.0 - 14.0 %   EOS% 0.1 0.0 - 7.0 %   BASO% 0.6 0.0 - 2.0 %   nRBC 0 0 - 0 %  Comprehensive metabolic panel  Result Value Ref Range   Sodium 137 136 - 145 mEq/L   Potassium 3.6 3.5 - 5.1 mEq/L   Chloride 103 98 - 109 mEq/L   CO2 24 22 - 29 mEq/L   Glucose 114 70 - 140 mg/dl   BUN 11.8 7.0 - 26.0 mg/dL   Creatinine 0.8 0.6 - 1.1 mg/dL   Total Bilirubin 0.26 0.20 - 1.20 mg/dL    Alkaline Phosphatase 122 40 - 150 U/L   AST 26 5 - 34 U/L   ALT 24 0 - 55 U/L   Total Protein 6.4 6.4 - 8.3 g/dL   Albumin 3.2 (L) 3.5 - 5.0 g/dL   Calcium 9.1 8.4 - 10.4 mg/dL   Anion Gap 10 3 - 11 mEq/L   EGFR 76 (L) >90 ml/min/1.73 m2    bp was low in the hospital  Holding lisinopril  bp was as low as 78/55 at home over the weekend - she was wiped out /tired  No syncope or near syncope  Now it is starting to come back up   BP Readings from Last 3 Encounters:  10/15/14 118/72  10/14/14 119/80  10/06/14 124/56    Still taking metoprolol   No  urinary symptoms at all   Patient Active Problem List   Diagnosis Date Noted  . UTI (urinary tract infection) 09/22/2014  . Neutropenic fever 09/21/2014  . Sepsis 09/21/2014  . Sinus tachycardia 09/21/2014  . Hyponatremia 09/21/2014  . UTI (lower urinary tract infection)   . Anxiety 07/08/2014  . Breast cancer of lower-outer quadrant of right female breast 06/19/2014  . Encounter for Medicare annual wellness exam 04/25/2014  . Estrogen deficiency 04/25/2014  . ETD (eustachian tube dysfunction) 01/10/2014  . Stress reaction 01/03/2014  . Palpitations 01/03/2014  . Gynecological examination 03/04/2011  . Routine general medical examination at a health care facility 02/26/2011  . HYPERCHOLESTEROLEMIA, PURE 02/01/2007  . Essential hypertension 09/08/2006  . ALLERGIC RHINITIS 09/08/2006  . TOBACCO ABUSE, HX OF 09/08/2006   Past Medical History  Diagnosis Date  . Allergic rhinitis   . HTN (hypertension)   . HLD (hyperlipidemia)   . Tobacco abuse   . Family history of malignant neoplasm of breast   . Family history of other kidney diseases   . Disorder of bone and cartilage, unspecified   . Breast cancer of lower-outer quadrant of right female breast 06/19/2014  . Anxiety   . PONV (postoperative nausea and vomiting)    Past Surgical History  Procedure Laterality Date  . Total vaginal hysterectomy  1996    endometriosis    . Abdominal hysterectomy    . Appendectomy    . Colonoscopy    . Dilation and curettage of uterus    . Breast lumpectomy with needle localization and axillary sentinel lymph node bx Right 07/21/2014    Procedure: BREAST LUMPECTOMY WITH NEEDLE LOCALIZATION AND AXILLARY SENTINEL LYMPH NODE BIOPSY;  Surgeon: Rolm Bookbinder, MD;  Location: [place];  Service: General;  Laterality: Right;  . Portacath placement Right 09/10/2014    Procedure: INSERTION PORT-A-CATH;  Surgeon: Rolm Bookbinder, MD;  Location: [place];  Service: General;  Laterality: Right;   History  Substance Use Topics  . Smoking status: Former Smoker -- 1.00 packs/day    Types: Cigarettes    Quit date: 06/25/2010  . Smokeless tobacco: Not on file  . Alcohol Use: 0.0 oz/week    0 Standard drinks or equivalent per week     Comment: occasional wine   Family History  Problem Relation Age of Onset  . Breast cancer Sister   . Hypertension Mother   . Kidney disease Father   . Diabetes Paternal Aunt    No Known Allergies Current Outpatient Prescriptions on File Prior to Visit  Medication Sig Dispense Refill  . ALPRAZolam (XANAX) 0.5 MG tablet Take 1 tablet (0.5 mg total) by mouth daily as needed for anxiety. 30 tablet 1  . atorvastatin (LIPITOR) 10 MG tablet Take 1 tablet (10 mg total) by mouth daily. 30 tablet 11  . b complex vitamins tablet Take 1 tablet by mouth daily.     . Calcium Carbonate-Vitamin D (CALCIUM-VITAMIN D) 500-200 MG-UNIT per tablet Take 3 tablets by mouth every morning.      . cetirizine (ZYRTEC) 10 MG tablet Take 10 mg by mouth daily as needed for allergies.     Marland Kitchen dexamethasone (DECADRON) 4 MG tablet Take 2 tablets (8 mg total) by mouth 2 (two) times daily. Start the day before Taxotere. Then again the day after chemo for 3 days. 30 tablet 1  . diphenhydrAMINE (BENADRYL) 25 MG tablet Take 25 mg by mouth daily as needed for allergies.     . feeding  supplement, ENSURE ENLIVE, (ENSURE  ENLIVE) LIQD Take 237 mLs by mouth 2 (two) times daily between meals. 237 mL 12  . fluticasone (FLONASE) 50 MCG/ACT nasal spray Place 2 sprays into both nostrils daily. 16 g 6  . metoprolol succinate (TOPROL-XL) 50 MG 24 hr tablet Take 1 tablet (50 mg total) by mouth daily. Take with or immediately following a meal. 30 tablet 11  . Multiple Vitamin (MULTIVITAMIN) tablet Take 1 tablet by mouth daily.      . ondansetron (ZOFRAN) 8 MG tablet Take twice a day. Start the evening of chemo day and continue for 3 days. Then take as needed for nausea or vomiting. 30 tablet 1  . oxyCODONE-acetaminophen (PERCOCET) 10-325 MG per tablet Take 1 tablet by mouth every 6 (six) hours as needed for pain. 10 tablet 0  . pegfilgrastim (NEULASTA ONPRO KIT) 6 MG/0.6ML injection Inject 6 mg into the skin once. Inject via provided programmed delivery device.    Marland Kitchen PRESCRIPTION MEDICATION Chemo - [facility]    . prochlorperazine (COMPAZINE) 10 MG tablet Take one tablet 4 times a day (before meals and at bedtime) starting the evening of chemo day and continuing for 3 days for nausea 30 tablet 1  . tobramycin-dexamethasone (TOBRADEX) ophthalmic solution Place 1 drop into both eyes 2 (two) times daily. 5 mL 0  . UNABLE TO FIND 1 each by Other route once. Dispense per medical necessity cranial prothesis due to alopecia induced by chemotherapy for breast cancer diagnosis 1 each 1  . lisinopril-hydrochlorothiazide (PRINZIDE,ZESTORETIC) 20-25 MG per tablet Take 1 tablet by mouth daily.      No current facility-administered medications on file prior to visit.    Review of Systems Review of Systems  Constitutional: Negative for fever, appetite change,  and unexpected weight change. (pos for fatigue/malaise with chemo that is better today) Eyes: Negative for pain and visual disturbance.  Respiratory: Negative for cough and shortness of breath.   Cardiovascular: Negative for cp or palpitations    Gastrointestinal: Negative for nausea,  diarrhea and constipation.  Genitourinary: Negative for urgency and frequency.  Skin: Negative for pallor or rash   Neurological: Negative for weakness, numbness and headaches. pos for dizziness when BP is low  Hematological: Negative for adenopathy. Does not bruise/bleed easily.  Psychiatric/Behavioral: Negative for dysphoric mood. The patient is not nervous/anxious.         Objective:   Physical Exam  Constitutional: She appears well-developed and well-nourished. No distress.  Well appearing today  HENT:  Head: Normocephalic and atraumatic.  Mouth/Throat: Oropharynx is clear and moist.  Eyes: Conjunctivae and EOM are normal. Pupils are equal, round, and reactive to light.  Neck: Normal range of motion. Neck supple. No JVD present. Carotid bruit is not present. No thyromegaly present.  Cardiovascular: Normal rate, regular rhythm, normal heart sounds and intact distal pulses.  Exam reveals no gallop.   Pulmonary/Chest: Effort normal and breath sounds normal. No respiratory distress. She has no wheezes. She has no rales.  No crackles  Abdominal: Soft. Bowel sounds are normal. She exhibits no distension, no abdominal bruit and no mass. There is no tenderness.  No suprapubic tenderness or fullness    No cva tenderness   Musculoskeletal: She exhibits no edema or tenderness.  Lymphadenopathy:    She has no cervical adenopathy.  Neurological: She is alert. She has normal reflexes.  Skin: Skin is warm and dry. No rash noted.  Psychiatric: She has a normal mood and affect.  Cheerful  Assessment & Plan:   Problem List Items Addressed This Visit    Essential hypertension - Primary    bp down significantly since starting Neulasta for breast cancer (has had 2 tx out of 4 ) with one hosp for neutropenic fever/uti Will continue to hold lisinopril hct for now  Then hold metoprolol for lower bp or dizziness (pt aware) Will keep [place] updated  Reassuring exam today      UTI  (urinary tract infection)    Re check ua today -normal  No symptoms  99 temp but very hot outside-pt states she feels fine  Rev hosp notes and studies from prev uti/neutropenic fever assoc with chemo      Relevant Orders   POCT urinalysis dipstick (Completed)

## 2014-10-15 NOTE — Patient Instructions (Signed)
Hold the lisinipril HCT until further notice-will watch your blood pressure  If BP goes below 90s/50s- then also hold metoprolol  If any new symptoms let me know, or if fever  Leave a urine sample on the way out  Good luck finishing treatments

## 2014-10-15 NOTE — Assessment & Plan Note (Signed)
bp down significantly since starting Neulasta for breast cancer (has had 2 tx out of 4 ) with one hosp for neutropenic fever/uti Will continue to hold lisinopril hct for now  Then hold metoprolol for lower bp or dizziness (pt aware) Will keep [place] updated  Reassuring exam today

## 2014-10-15 NOTE — Assessment & Plan Note (Addendum)
Re check ua today -normal  No symptoms  99 temp but very hot outside-pt states she feels fine  Rev hosp notes and studies from prev uti/neutropenic fever assoc with chemo

## 2014-10-27 ENCOUNTER — Telehealth: Payer: Self-pay | Admitting: Nurse Practitioner

## 2014-10-27 ENCOUNTER — Encounter: Payer: Self-pay | Admitting: Nurse Practitioner

## 2014-10-27 ENCOUNTER — Other Ambulatory Visit: Payer: Self-pay | Admitting: Oncology

## 2014-10-27 ENCOUNTER — Encounter: Payer: Self-pay | Admitting: *Deleted

## 2014-10-27 ENCOUNTER — Ambulatory Visit (HOSPITAL_BASED_OUTPATIENT_CLINIC_OR_DEPARTMENT_OTHER): Payer: Medicare Other

## 2014-10-27 ENCOUNTER — Ambulatory Visit (HOSPITAL_BASED_OUTPATIENT_CLINIC_OR_DEPARTMENT_OTHER): Payer: Medicare Other | Admitting: Nurse Practitioner

## 2014-10-27 ENCOUNTER — Other Ambulatory Visit (HOSPITAL_BASED_OUTPATIENT_CLINIC_OR_DEPARTMENT_OTHER): Payer: Medicare Other

## 2014-10-27 VITALS — BP 154/83 | HR 88 | Temp 98.1°F | Resp 18 | Ht 61.0 in | Wt 136.1 lb

## 2014-10-27 DIAGNOSIS — C50511 Malignant neoplasm of lower-outer quadrant of right female breast: Secondary | ICD-10-CM

## 2014-10-27 DIAGNOSIS — Z5189 Encounter for other specified aftercare: Secondary | ICD-10-CM | POA: Diagnosis not present

## 2014-10-27 DIAGNOSIS — Z5111 Encounter for antineoplastic chemotherapy: Secondary | ICD-10-CM | POA: Diagnosis not present

## 2014-10-27 DIAGNOSIS — Z170 Estrogen receptor positive status [ER+]: Secondary | ICD-10-CM | POA: Diagnosis not present

## 2014-10-27 LAB — CBC WITH DIFFERENTIAL/PLATELET
BASO%: 0.5 % (ref 0.0–2.0)
Basophils Absolute: 0.1 10*3/uL (ref 0.0–0.1)
EOS%: 0 % (ref 0.0–7.0)
Eosinophils Absolute: 0 10*3/uL (ref 0.0–0.5)
HCT: 32.2 % — ABNORMAL LOW (ref 34.8–46.6)
HGB: 11 g/dL — ABNORMAL LOW (ref 11.6–15.9)
LYMPH%: 4 % — AB (ref 14.0–49.7)
MCH: 32.7 pg (ref 25.1–34.0)
MCHC: 34 g/dL (ref 31.5–36.0)
MCV: 96.1 fL (ref 79.5–101.0)
MONO#: 0.8 10*3/uL (ref 0.1–0.9)
MONO%: 5.3 % (ref 0.0–14.0)
NEUT#: 12.8 10*3/uL — ABNORMAL HIGH (ref 1.5–6.5)
NEUT%: 90.2 % — AB (ref 38.4–76.8)
PLATELETS: 330 10*3/uL (ref 145–400)
RBC: 3.35 10*6/uL — ABNORMAL LOW (ref 3.70–5.45)
RDW: 15.4 % — ABNORMAL HIGH (ref 11.2–14.5)
WBC: 14.2 10*3/uL — ABNORMAL HIGH (ref 3.9–10.3)
lymph#: 0.6 10*3/uL — ABNORMAL LOW (ref 0.9–3.3)

## 2014-10-27 LAB — COMPREHENSIVE METABOLIC PANEL (CC13)
ALK PHOS: 78 U/L (ref 40–150)
ALT: 19 U/L (ref 0–55)
AST: 16 U/L (ref 5–34)
Albumin: 3.5 g/dL (ref 3.5–5.0)
Anion Gap: 11 mEq/L (ref 3–11)
BUN: 11.3 mg/dL (ref 7.0–26.0)
CHLORIDE: 109 meq/L (ref 98–109)
CO2: 21 meq/L — AB (ref 22–29)
CREATININE: 0.7 mg/dL (ref 0.6–1.1)
Calcium: 9.3 mg/dL (ref 8.4–10.4)
EGFR: 86 mL/min/{1.73_m2} — AB (ref >90)
GLUCOSE: 140 mg/dL (ref 70–140)
POTASSIUM: 4.1 meq/L (ref 3.5–5.1)
Sodium: 141 mEq/L (ref 136–145)
Total Bilirubin: 0.36 mg/dL (ref 0.20–1.20)
Total Protein: 6.6 g/dL (ref 6.4–8.3)

## 2014-10-27 MED ORDER — SODIUM CHLORIDE 0.9 % IV SOLN
Freq: Once | INTRAVENOUS | Status: AC
  Administered 2014-10-27: 10:00:00 via INTRAVENOUS

## 2014-10-27 MED ORDER — SODIUM CHLORIDE 0.9 % IV SOLN
500.0000 mg/m2 | Freq: Once | INTRAVENOUS | Status: AC
  Administered 2014-10-27: 800 mg via INTRAVENOUS
  Filled 2014-10-27: qty 40

## 2014-10-27 MED ORDER — HEPARIN SOD (PORK) LOCK FLUSH 100 UNIT/ML IV SOLN
500.0000 [IU] | Freq: Once | INTRAVENOUS | Status: AC | PRN
  Administered 2014-10-27: 500 [IU]
  Filled 2014-10-27: qty 5

## 2014-10-27 MED ORDER — SODIUM CHLORIDE 0.9 % IV SOLN
Freq: Once | INTRAVENOUS | Status: AC
  Administered 2014-10-27: 10:00:00 via INTRAVENOUS
  Filled 2014-10-27: qty 8

## 2014-10-27 MED ORDER — SODIUM CHLORIDE 0.9 % IJ SOLN
10.0000 mL | INTRAMUSCULAR | Status: DC | PRN
  Administered 2014-10-27: 10 mL
  Filled 2014-10-27: qty 10

## 2014-10-27 MED ORDER — DOCETAXEL CHEMO INJECTION 160 MG/16ML
60.0000 mg/m2 | Freq: Once | INTRAVENOUS | Status: AC
  Administered 2014-10-27: 100 mg via INTRAVENOUS
  Filled 2014-10-27: qty 10

## 2014-10-27 MED ORDER — PEGFILGRASTIM 6 MG/0.6ML ~~LOC~~ PSKT
6.0000 mg | PREFILLED_SYRINGE | Freq: Once | SUBCUTANEOUS | Status: AC
  Administered 2014-10-27: 6 mg via SUBCUTANEOUS
  Filled 2014-10-27: qty 0.6

## 2014-10-27 NOTE — Progress Notes (Signed)
[place]  [phone] [phone]     ID: Jamie Dillon DOB: 1950-08-23  MR#: [id number]  KGU#:[id number]  Patient Care Team: Jamie Greenspan, MD as PCP - General Jamie Bookbinder, MD as Consulting Physician (General Surgery) Jamie Cruel, MD as Consulting Physician (Oncology) Jamie Gibson, MD as Attending Physician (Radiation Oncology) Jamie Germany, RN as Registered Nurse Jamie Kaufmann, RN as Registered Nurse Jamie Bouche, NP as Nurse Practitioner (Nurse Practitioner) OTHER MD:  CHIEF COMPLAINT:  Estrogen receptor positive breast cancer  CURRENT TREATMENT: adjuvant chemotherapy   BREAST CANCER HISTORY: From the original intake note:  Jamie Dillon had routine screening mammography at [facility] 06/05/2014. The breast density was category B. There was a 6 mm irregular mass in the right breast. On 06/11/2014 the patient underwent right breast ultrasonography at [facility]. This confirmed a 7 mm all her than wide irregular mass in the right blast at the 8:00 position. 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  The patient's subsequent history is as detailed below  INTERVAL HISTORY: Jamie Dillon returns today for follow-up of her breast cancer, accompanied by her husband Jamie Dillon. Today is 1, cycle 3 of 4 planned cycles of cyclophosphamide and docetaxel given every 3 weeks with Neulasta support.   REVIEW OF SYSTEMS: Jamie Dillon denies fevers, chills, nausea, vomiting, or changes in bowel or bladder habits. Her appetite is improved and she is no longer needing to supplement with ensure. She denies mouth sores, rashes, or neuropathy symptoms. She uses benadryl PRN for sleep. The tobradex  helps with her eye irritation. Her PCP is holding her lisinopril. A detailed review of systems is otherwise stable.  PAST MEDICAL HISTORY: Past Medical History  Diagnosis Date  . Allergic rhinitis   . HTN (hypertension)   . HLD (hyperlipidemia)   . Tobacco abuse   . Family history of malignant neoplasm of breast   . Family history of other kidney diseases   . Disorder of bone and cartilage, unspecified   . Breast cancer of lower-outer quadrant of right female breast 06/19/2014  . Anxiety   . PONV (postoperative nausea and vomiting)     PAST SURGICAL HISTORY: Past Surgical History  Procedure Laterality Date  . Total vaginal hysterectomy  1996    endometriosis  . Abdominal hysterectomy    . Appendectomy    . Colonoscopy    . Dilation and curettage of uterus    . Breast lumpectomy with needle localization and axillary sentinel lymph node bx Right 07/21/2014    Procedure: BREAST LUMPECTOMY WITH NEEDLE LOCALIZATION AND AXILLARY SENTINEL LYMPH NODE BIOPSY;  Surgeon: Jamie Bookbinder, MD;  Location: [place];  Service: General;  Laterality: Right;  . Portacath placement Right 09/10/2014    Procedure: INSERTION PORT-A-CATH;  Surgeon: Jamie Bookbinder, MD;  Location: [place];  Service: General;  Laterality: Right;    FAMILY HISTORY Family History  Problem Relation Age of Onset  . Breast cancer Sister   . Hypertension Mother   . Kidney disease Father   . Diabetes Paternal Aunt    the patient's father died from kidney failure at the age of 85. The patient's mother is still living at age 1. The patient had no brothers, one sister. That sister  was diagnosed with breast cancer at age 39. The patient's father's mother was diagnosed with mouth cancer at the age of 46.  GYNECOLOGIC HISTORY:  No LMP recorded. Patient has had a hysterectomy. Menarche age 15, first live birth age 86. The patient is GX P2. She had a total abdominal hysterectomy with bilateral salpingo-oophorectomy  in 1996. She took hormone replacement for approximately 10 years, until 2009. She also took oral contraceptives for approximately 7 years remotely, with no complications  SOCIAL HISTORY:  The patient is currently retired, though she still works part-time at the [employer] Her husband "Jamie Dillon" Kahlen Dillon is retired from Press [employer]. Son Jamie Dillon "Jamie Dillon" Jamie Dillon teaches English at [employer] in [place]. Daughter Jamie Dillon lives in [place] . She works as a Marine scientist at the [employer]    Jamie Dillon: In place   HEALTH MAINTENANCE: History  Substance Use Topics  . Smoking status: Former Smoker -- 1.00 packs/day    Types: Cigarettes    Quit date: 06/25/2010  . Smokeless tobacco: Not on file  . Alcohol Use: 0.0 oz/week    0 Standard drinks or equivalent per week     Comment: occasional wine     Colonoscopy: 2010  PAP:  Bone density: December 2015  Lipid panel:  No Known Allergies  Current Outpatient Prescriptions  Medication Sig Dispense Refill  . ALPRAZolam (XANAX) 0.5 MG tablet Take 1 tablet (0.5 mg total) by mouth daily as needed for anxiety. 30 tablet 1  . atorvastatin (LIPITOR) 10 MG tablet Take 1 tablet (10 mg total) by mouth daily. 30 tablet 11  . b complex vitamins tablet Take 1 tablet by mouth daily.     . Calcium Carbonate-Vitamin D (CALCIUM-VITAMIN D) 500-200 MG-UNIT per tablet Take 3 tablets by mouth every morning.      . cetirizine (ZYRTEC) 10 MG tablet Take 10 mg by mouth daily as needed for allergies.     Marland Kitchen dexamethasone (DECADRON) 4 MG tablet Take 2 tablets (8 mg total) by mouth 2 (two) times daily. Start the day before Taxotere. Then again the day after chemo for 3 days. 30 tablet 1  . diphenhydrAMINE (BENADRYL) 25 MG tablet Take 25 mg by mouth daily as needed for allergies.     . fluticasone (FLONASE) 50 MCG/ACT nasal spray Place 2 sprays into both nostrils daily. 16 g 6  . metoprolol succinate (TOPROL-XL) 50 MG 24 hr  tablet Take 1 tablet (50 mg total) by mouth daily. Take with or immediately following a meal. 30 tablet 11  . Multiple Vitamin (MULTIVITAMIN) tablet Take 1 tablet by mouth daily.      . ondansetron (ZOFRAN) 8 MG tablet Take twice a day. Start the evening of chemo day and continue for 3 days. Then take as needed for nausea or vomiting. 30 tablet 1  . pegfilgrastim (NEULASTA ONPRO KIT) 6 MG/0.6ML injection Inject 6 mg into the skin once. Inject via provided programmed delivery device.    Marland Kitchen PRESCRIPTION MEDICATION Chemo - [facility]    . prochlorperazine (COMPAZINE) 10 MG tablet Take one tablet 4 times a day (before meals and at bedtime) starting the evening of chemo day and continuing for 3 days for nausea 30 tablet 1  . tobramycin-dexamethasone (TOBRADEX) ophthalmic solution Place 1 drop into both eyes 2 (two) times daily. 5 mL 0  . UNABLE TO FIND 1 each by Other route once. Dispense per medical necessity cranial prothesis due to alopecia induced by chemotherapy for breast cancer  diagnosis 1 each 1  . feeding supplement, ENSURE ENLIVE, (ENSURE ENLIVE) LIQD Take 237 mLs by mouth 2 (two) times daily between meals. (Patient not taking: Reported on 10/27/2014) 237 mL 12  . oxyCODONE-acetaminophen (PERCOCET) 10-325 MG per tablet Take 1 tablet by mouth every 6 (six) hours as needed for pain. (Patient not taking: Reported on 10/27/2014) 10 tablet 0   No current facility-administered medications for this visit.    OBJECTIVE: Middle-aged white woman in no acute distress Filed Vitals:   10/27/14 0858  BP: 154/83  Pulse: 88  Temp: 98.1 F (36.7 C)  Resp: 18     Body mass index is 25.73 kg/(m^2).    ECOG FS:0 - Asymptomatic  Sclerae unicteric, pupils round and equal Oropharynx clear and moist-- no thrush or other lesions No cervical or supraclavicular adenopathy Lungs no rales or rhonchi Heart regular rate and rhythm Abd soft, nontender, positive bowel sounds MSK no focal spinal tenderness, no upper  extremity lymphedema Neuro: nonfocal, well oriented, appropriate affect Breasts: deferred  LAB RESULTS:  CMP     Component Value Date/Time   NA 141 10/27/2014 0841   NA 135 09/24/2014 0925   K 4.1 10/27/2014 0841   K 3.5 09/24/2014 0925   CL 103 09/24/2014 0925   CO2 21* 10/27/2014 0841   CO2 23 09/24/2014 0925   GLUCOSE 140 10/27/2014 0841   GLUCOSE 127* 09/24/2014 0925   BUN 11.3 10/27/2014 0841   BUN 5* 09/24/2014 0925   CREATININE 0.7 10/27/2014 0841   CREATININE 0.79 09/24/2014 0925   CALCIUM 9.3 10/27/2014 0841   CALCIUM 7.9* 09/24/2014 0925   PROT 6.6 10/27/2014 0841   PROT 6.6 09/21/2014 1706   ALBUMIN 3.5 10/27/2014 0841   ALBUMIN 3.2* 09/21/2014 1706   AST 16 10/27/2014 0841   AST 30 09/21/2014 1706   ALT 19 10/27/2014 0841   ALT 16 09/21/2014 1706   ALKPHOS 78 10/27/2014 0841   ALKPHOS 71 09/21/2014 1706   BILITOT 0.36 10/27/2014 0841   BILITOT 1.9* 09/21/2014 1706   GFRNONAA >60 09/24/2014 0925   GFRAA >60 09/24/2014 0925    INo results found for: SPEP, UPEP  Lab Results  Component Value Date   WBC 14.2* 10/27/2014   NEUTROABS 12.8* 10/27/2014   HGB 11.0* 10/27/2014   HCT 32.2* 10/27/2014   MCV 96.1 10/27/2014   PLT 330 10/27/2014      Chemistry      Component Value Date/Time   NA 141 10/27/2014 0841   NA 135 09/24/2014 0925   K 4.1 10/27/2014 0841   K 3.5 09/24/2014 0925   CL 103 09/24/2014 0925   CO2 21* 10/27/2014 0841   CO2 23 09/24/2014 0925   BUN 11.3 10/27/2014 0841   BUN 5* 09/24/2014 0925   CREATININE 0.7 10/27/2014 0841   CREATININE 0.79 09/24/2014 0925      Component Value Date/Time   CALCIUM 9.3 10/27/2014 0841   CALCIUM 7.9* 09/24/2014 0925   ALKPHOS 78 10/27/2014 0841   ALKPHOS 71 09/21/2014 1706   AST 16 10/27/2014 0841   AST 30 09/21/2014 1706   ALT 19 10/27/2014 0841   ALT 16 09/21/2014 1706   BILITOT 0.36 10/27/2014 0841   BILITOT 1.9* 09/21/2014 1706       No results found for: LABCA2  No components  found for: [id number]  No results for input(s): INR in the last 168 hours.  Urinalysis    Component Value Date/Time   COLORURINE YELLOW 09/21/2014 1809  APPEARANCEUR CLOUDY* 09/21/2014 1809   LABSPEC 1.009 09/21/2014 1809   PHURINE 7.0 09/21/2014 1809   GLUCOSEU NEGATIVE 09/21/2014 1809   HGBUR MODERATE* 09/21/2014 1809   HGBUR negative 02/13/2008 0911   BILIRUBINUR Neg. 10/15/2014 1248   BILIRUBINUR NEGATIVE 09/21/2014 1809   KETONESUR NEGATIVE 09/21/2014 1809   PROTEINUR Neg. 10/15/2014 1248   PROTEINUR 30* 09/21/2014 1809   UROBILINOGEN 0.2 10/15/2014 1248   UROBILINOGEN 0.2 09/21/2014 1809   NITRITE Neg. 10/15/2014 1248   NITRITE POSITIVE* 09/21/2014 1809   LEUKOCYTESUR Negative 10/15/2014 1248    STUDIES: No results found.   ASSESSMENT: 64 y.o. [place] woman status post right breast biopsy 06/17/2014 for a clinical T1b N0, stage I invasive ductal carcinoma, grade 2, estrogen receptor strongly positive, progesterone receptor moderately positive, with no HER-2 amplification and an MIB-1 of 22%.  (1) status post right lumpectomy and right axillary sentinel lymph node sampling 07/21/2014 for a pT1b pN1a, stage IIA invasive ductal carcinoma, grade 1, with negative margins, repeat HER-2/neu again negative.  (2) adjuvant chemotherapy will consist of cyclophosphamide and docetaxel given every 3 weeks with Neulasta support, start date 09/15/2014. Neutropenic fever and hospitalization 5 days after the first cycle. Dose subsequently reduced.  (3) the patient will require adjuvant radiation therapy  (4) anti-estrogens to follow radiation   PLAN: Taressa is doing well today. The labs were reviewed in detail and were entirely stable. She will proceed with cycle 3 of cyclophosphamide and docetaxel as planned today.  Valene will return in 1 week for labs and a nadir visit. She understands and agrees with this plan. She knows the goal of treatment in her case is cure. She has been  encouraged to call with any issues that might arise before her next visit here.   Laurie Panda, NP   10/27/2014 9:19 AM

## 2014-10-27 NOTE — Telephone Encounter (Signed)
Appointments added per pof and patient will get a new avs in chemo

## 2014-10-27 NOTE — Progress Notes (Signed)
Oncology Nurse Navigator Documentation  Oncology Nurse Navigator Flowsheets 10/27/2014  Navigator Encounter Type Treatment  Patient Visit Type Medonc  Treatment Phase Treatment  Barriers/Navigation Needs No barriers at this time  Time Spent with Patient 15   Met with pt during chemo therapy treatment. Relate doing well and without complaints.

## 2014-10-27 NOTE — Patient Instructions (Signed)
[facility] Discharge Instructions for Patients Receiving Chemotherapy  Today you received the following chemotherapy agents Taxotere and Cytoxan.  To help prevent nausea and vomiting after your treatment, we encourage you to take your nausea medication as prescribed.   If you develop nausea and vomiting that is not controlled by your nausea medication, call the clinic.   BELOW ARE SYMPTOMS THAT SHOULD BE REPORTED IMMEDIATELY:  *FEVER GREATER THAN 100.5 F  *CHILLS WITH OR WITHOUT FEVER  NAUSEA AND VOMITING THAT IS NOT CONTROLLED WITH YOUR NAUSEA MEDICATION  *UNUSUAL SHORTNESS OF BREATH  *UNUSUAL BRUISING OR BLEEDING  TENDERNESS IN MOUTH AND THROAT WITH OR WITHOUT PRESENCE OF ULCERS  *URINARY PROBLEMS  *BOWEL PROBLEMS  UNUSUAL RASH Items with * indicate a potential emergency and should be followed up as soon as possible.  Feel free to call the clinic you have any questions or concerns. The clinic phone number is [phone].  Please show the [facility] at check-in to the Emergency Department and triage nurse.

## 2014-10-28 ENCOUNTER — Ambulatory Visit (HOSPITAL_BASED_OUTPATIENT_CLINIC_OR_DEPARTMENT_OTHER): Payer: Medicare Other

## 2014-10-28 VITALS — BP 155/100 | HR 85 | Temp 98.2°F

## 2014-10-28 DIAGNOSIS — Z5189 Encounter for other specified aftercare: Secondary | ICD-10-CM | POA: Diagnosis not present

## 2014-10-28 DIAGNOSIS — C50511 Malignant neoplasm of lower-outer quadrant of right female breast: Secondary | ICD-10-CM

## 2014-10-28 MED ORDER — PEGFILGRASTIM INJECTION 6 MG/0.6ML ~~LOC~~
6.0000 mg | PREFILLED_SYRINGE | Freq: Once | SUBCUTANEOUS | Status: AC
  Administered 2014-10-28: 6 mg via SUBCUTANEOUS
  Filled 2014-10-28: qty 0.6

## 2014-10-29 ENCOUNTER — Encounter: Payer: Self-pay | Admitting: Genetic Counselor

## 2014-11-03 ENCOUNTER — Telehealth: Payer: Self-pay | Admitting: Nurse Practitioner

## 2014-11-03 ENCOUNTER — Other Ambulatory Visit (HOSPITAL_BASED_OUTPATIENT_CLINIC_OR_DEPARTMENT_OTHER): Payer: Medicare Other

## 2014-11-03 ENCOUNTER — Ambulatory Visit (HOSPITAL_BASED_OUTPATIENT_CLINIC_OR_DEPARTMENT_OTHER): Payer: Medicare Other | Admitting: Nurse Practitioner

## 2014-11-03 ENCOUNTER — Encounter: Payer: Self-pay | Admitting: Nurse Practitioner

## 2014-11-03 VITALS — BP 127/87 | HR 54 | Temp 98.3°F | Resp 18 | Wt 136.0 lb

## 2014-11-03 DIAGNOSIS — Z170 Estrogen receptor positive status [ER+]: Secondary | ICD-10-CM

## 2014-11-03 DIAGNOSIS — C50511 Malignant neoplasm of lower-outer quadrant of right female breast: Secondary | ICD-10-CM

## 2014-11-03 LAB — COMPREHENSIVE METABOLIC PANEL (CC13)
ALBUMIN: 3.1 g/dL — AB (ref 3.5–5.0)
ALT: 21 U/L (ref 0–55)
ANION GAP: 7 meq/L (ref 3–11)
AST: 26 U/L (ref 5–34)
Alkaline Phosphatase: 96 U/L (ref 40–150)
BUN: 10.7 mg/dL (ref 7.0–26.0)
CALCIUM: 9.7 mg/dL (ref 8.4–10.4)
CHLORIDE: 103 meq/L (ref 98–109)
CO2: 32 mEq/L — ABNORMAL HIGH (ref 22–29)
CREATININE: 1 mg/dL (ref 0.6–1.1)
EGFR: 63 mL/min/{1.73_m2} — AB (ref >90)
GLUCOSE: 103 mg/dL (ref 70–140)
Potassium: 4 mEq/L (ref 3.5–5.1)
Sodium: 142 mEq/L (ref 136–145)
Total Bilirubin: 0.38 mg/dL (ref 0.20–1.20)
Total Protein: 6 g/dL — ABNORMAL LOW (ref 6.4–8.3)

## 2014-11-03 LAB — CBC WITH DIFFERENTIAL/PLATELET
BASO%: 0.4 % (ref 0.0–2.0)
BASOS ABS: 0.1 10*3/uL (ref 0.0–0.1)
EOS ABS: 0.1 10*3/uL (ref 0.0–0.5)
EOS%: 0.5 % (ref 0.0–7.0)
HCT: 31.3 % — ABNORMAL LOW (ref 34.8–46.6)
HEMOGLOBIN: 10.4 g/dL — AB (ref 11.6–15.9)
LYMPH%: 7.7 % — AB (ref 14.0–49.7)
MCH: 32.5 pg (ref 25.1–34.0)
MCHC: 33.2 g/dL (ref 31.5–36.0)
MCV: 97.7 fL (ref 79.5–101.0)
MONO#: 2.4 10*3/uL — AB (ref 0.1–0.9)
MONO%: 8.4 % (ref 0.0–14.0)
NEUT%: 83 % — ABNORMAL HIGH (ref 38.4–76.8)
NEUTROS ABS: 23.4 10*3/uL — AB (ref 1.5–6.5)
Platelets: 217 10*3/uL (ref 145–400)
RBC: 3.2 10*6/uL — ABNORMAL LOW (ref 3.70–5.45)
RDW: 16 % — ABNORMAL HIGH (ref 11.2–14.5)
WBC: 28.2 10*3/uL — AB (ref 3.9–10.3)
lymph#: 2.2 10*3/uL (ref 0.9–3.3)

## 2014-11-03 NOTE — Telephone Encounter (Signed)
Radiation appointment referral noted and they will call the patient with her appointment

## 2014-11-03 NOTE — Progress Notes (Signed)
[place]  [phone]) [phone] [phone]     ID: Jamie Dillon DOB: Sep 16, 1950  MR#: [id number]  WOE#:[id number]  Patient Care Team: Abner Greenspan, MD as PCP - General Rolm Bookbinder, MD as Consulting Physician (General Surgery) Chauncey Cruel, MD as Consulting Physician (Oncology) Eppie Gibson, MD as Attending Physician (Radiation Oncology) Rockwell Germany, RN as Registered Nurse Mauro Kaufmann, RN as Registered Nurse Holley Bouche, NP as Nurse Practitioner (Nurse Practitioner) OTHER MD:  CHIEF COMPLAINT:  Estrogen receptor positive breast cancer  CURRENT TREATMENT: adjuvant chemotherapy   BREAST CANCER HISTORY: From the original intake note:  Jamie Dillon had routine screening mammography at [facility] 06/05/2014. The breast density was category B. There was a 6 mm irregular mass in the right breast. On 06/11/2014 the patient underwent right breast ultrasonography at [facility]. This confirmed a 7 mm all her than wide irregular mass in the right blast at the 8:00 position. 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  The patient's subsequent history is as detailed below  INTERVAL HISTORY: Jamie Dillon returns today for follow-up of her breast cancer, accompanied by her husband Jamie Dillon. Today is 8, cycle 3 of 4 planned cycles of cyclophosphamide and docetaxel given every 3 weeks with Neulasta support.   REVIEW OF SYSTEMS: Jamie Dillon denies fevers, chills, nausea, vomiting, or changes in bowel or bladder habits. She is eating and drinking well. She denies mouth sores, rashes, or neuropathy symptoms. She uses benadryl PRN for sleep, and so far she has some dwindling energy during the day so far.  The tobradex helps with her eye irritation. Her PCP is holding her lisinopril. A detailed review of systems is otherwise stable.  PAST MEDICAL HISTORY: Past Medical History  Diagnosis Date  . Allergic rhinitis   . HTN (hypertension)   . HLD (hyperlipidemia)   . Tobacco abuse   . Family history of malignant neoplasm of breast   . Family history of other kidney diseases   . Disorder of bone and cartilage, unspecified   . Breast cancer of lower-outer quadrant of right female breast 06/19/2014  . Anxiety   . PONV (postoperative nausea and vomiting)     PAST SURGICAL HISTORY: Past Surgical History  Procedure Laterality Date  . Total vaginal hysterectomy  1996    endometriosis  . Abdominal hysterectomy    . Appendectomy    . Colonoscopy    . Dilation and curettage of uterus    . Breast lumpectomy with needle localization and axillary sentinel lymph node bx Right 07/21/2014    Procedure: BREAST LUMPECTOMY WITH NEEDLE LOCALIZATION AND AXILLARY SENTINEL LYMPH NODE BIOPSY;  Surgeon: Rolm Bookbinder, MD;  Location: [facility];  Service: General;  Laterality: Right;  . Portacath placement Right 09/10/2014    Procedure: INSERTION PORT-A-CATH;  Surgeon: Rolm Bookbinder, MD;  Location: [facility];  Service: General;  Laterality: Right;    FAMILY HISTORY Family History  Problem Relation Age of Onset  . Breast cancer Sister 59  . Hypertension Mother   . Kidney disease Father   . Diabetes Paternal Aunt    the patient's father died from kidney failure at the age of 108. The patient's mother is still living at age 3. The patient had no  brothers, one sister. That sister was diagnosed with breast cancer at age 44. The patient's father's mother was diagnosed with mouth cancer at the age of 7.  GYNECOLOGIC HISTORY:  No LMP recorded. Patient has had a hysterectomy. Menarche age 36, first live birth age 69. The patient is GX P2. She had a total abdominal hysterectomy with bilateral  salpingo-oophorectomy in 1996. She took hormone replacement for approximately 10 years, until 2009. She also took oral contraceptives for approximately 7 years remotely, with no complications  SOCIAL HISTORY:  The patient is currently retired, though she still works part-time at the [employer] Her husband "Jamie Dillon" Jamie Dillon is retired from Press [employer]. Son Jamie Dillon "Jamie Cornea" Dillon teaches English at [employer] in [place]. Daughter Jamie Dillon lives in [place] . She works as a Marine scientist at the [employer]    [place]: In place   HEALTH MAINTENANCE: History  Substance Use Topics  . Smoking status: Former Smoker -- 1.00 packs/day    Types: Cigarettes    Quit date: 06/25/2010  . Smokeless tobacco: Not on file  . Alcohol Use: 0.0 oz/week    0 Standard drinks or equivalent per week     Comment: occasional wine     Colonoscopy: 2010  PAP:  Bone density: December 2015  Lipid panel:  No Known Allergies  Current Outpatient Prescriptions  Medication Sig Dispense Refill  . ALPRAZolam (XANAX) 0.5 MG tablet Take 1 tablet (0.5 mg total) by mouth daily as needed for anxiety. 30 tablet 1  . atorvastatin (LIPITOR) 10 MG tablet Take 1 tablet (10 mg total) by mouth daily. 30 tablet 11  . b complex vitamins tablet Take 1 tablet by mouth daily.     . Calcium Carbonate-Vitamin D (CALCIUM-VITAMIN D) 500-200 MG-UNIT per tablet Take 3 tablets by mouth every morning.      . metoprolol succinate (TOPROL-XL) 50 MG 24 hr tablet Take 1 tablet (50 mg total) by mouth daily. Take with or immediately following a meal. 30 tablet 11  . Multiple Vitamin (MULTIVITAMIN) tablet Take 1 tablet by mouth daily.      . pegfilgrastim (NEULASTA ONPRO KIT) 6 MG/0.6ML injection Inject 6 mg into the skin once. Inject via provided programmed delivery device.    Marland Kitchen PRESCRIPTION MEDICATION Chemo - [facility]    . tobramycin-dexamethasone (TOBRADEX) ophthalmic solution Place 1 drop into  both eyes 2 (two) times daily. 5 mL 0  . UNABLE TO FIND 1 each by Other route once. Dispense per medical necessity cranial prothesis due to alopecia induced by chemotherapy for breast cancer diagnosis 1 each 1  . cetirizine (ZYRTEC) 10 MG tablet Take 10 mg by mouth daily as needed for allergies.     Marland Kitchen dexamethasone (DECADRON) 4 MG tablet Take 2 tablets (8 mg total) by mouth 2 (two) times daily. Start the day before Taxotere. Then again the day after chemo for 3 days. (Patient not taking: Reported on 11/03/2014) 30 tablet 1  . diphenhydrAMINE (BENADRYL) 25 MG tablet Take 25 mg by mouth daily as needed for allergies.     . feeding supplement, ENSURE ENLIVE, (ENSURE ENLIVE) LIQD Take 237 mLs by mouth 2 (two) times daily between meals. (Patient not taking: Reported on 10/27/2014) 237 mL 12  . fluticasone (FLONASE) 50 MCG/ACT nasal spray Place 2 sprays into both nostrils daily. (Patient not taking: Reported on 11/03/2014) 16 g 6  . ondansetron (ZOFRAN) 8 MG tablet Take twice a day. Start the evening of chemo day and  continue for 3 days. Then take as needed for nausea or vomiting. (Patient not taking: Reported on 11/03/2014) 30 tablet 1  . oxyCODONE-acetaminophen (PERCOCET) 10-325 MG per tablet Take 1 tablet by mouth every 6 (six) hours as needed for pain. (Patient not taking: Reported on 10/27/2014) 10 tablet 0  . prochlorperazine (COMPAZINE) 10 MG tablet Take one tablet 4 times a day (before meals and at bedtime) starting the evening of chemo day and continuing for 3 days for nausea (Patient not taking: Reported on 11/03/2014) 30 tablet 1   No current facility-administered medications for this visit.    OBJECTIVE: Middle-aged white woman in no acute distress Filed Vitals:   11/03/14 1409  BP: 127/87  Pulse: 54  Temp: 98.3 F (36.8 C)  Resp: 18     Body mass index is 25.71 kg/(m^2).    ECOG FS:0 - Asymptomatic   Skin: warm, dry  HEENT: sclerae anicteric, conjunctivae pink, oropharynx clear. No thrush  or mucositis.  Lymph Nodes: No cervical or supraclavicular lymphadenopathy  Lungs: clear to auscultation bilaterally, no rales, wheezes, or rhonci  Heart: regular rate and rhythm  Abdomen: round, soft, non tender, positive bowel sounds  Musculoskeletal: No focal spinal tenderness, no peripheral edema  Neuro: non focal, well oriented, positive affect  Breasts: deferred  LAB RESULTS:  CMP     Component Value Date/Time   NA 142 11/03/2014 1351   NA 135 09/24/2014 0925   K 4.0 11/03/2014 1351   K 3.5 09/24/2014 0925   CL 103 09/24/2014 0925   CO2 32* 11/03/2014 1351   CO2 23 09/24/2014 0925   GLUCOSE 103 11/03/2014 1351   GLUCOSE 127* 09/24/2014 0925   BUN 10.7 11/03/2014 1351   BUN 5* 09/24/2014 0925   CREATININE 1.0 11/03/2014 1351   CREATININE 0.79 09/24/2014 0925   CALCIUM 9.7 11/03/2014 1351   CALCIUM 7.9* 09/24/2014 0925   PROT 6.0* 11/03/2014 1351   PROT 6.6 09/21/2014 1706   ALBUMIN 3.1* 11/03/2014 1351   ALBUMIN 3.2* 09/21/2014 1706   AST 26 11/03/2014 1351   AST 30 09/21/2014 1706   ALT 21 11/03/2014 1351   ALT 16 09/21/2014 1706   ALKPHOS 96 11/03/2014 1351   ALKPHOS 71 09/21/2014 1706   BILITOT 0.38 11/03/2014 1351   BILITOT 1.9* 09/21/2014 1706   GFRNONAA >60 09/24/2014 0925   GFRAA >60 09/24/2014 0925    INo results found for: SPEP, UPEP  Lab Results  Component Value Date   WBC 28.2* 11/03/2014   NEUTROABS 23.4* 11/03/2014   HGB 10.4* 11/03/2014   HCT 31.3* 11/03/2014   MCV 97.7 11/03/2014   PLT 217 11/03/2014      Chemistry      Component Value Date/Time   NA 142 11/03/2014 1351   NA 135 09/24/2014 0925   K 4.0 11/03/2014 1351   K 3.5 09/24/2014 0925   CL 103 09/24/2014 0925   CO2 32* 11/03/2014 1351   CO2 23 09/24/2014 0925   BUN 10.7 11/03/2014 1351   BUN 5* 09/24/2014 0925   CREATININE 1.0 11/03/2014 1351   CREATININE 0.79 09/24/2014 0925      Component Value Date/Time   CALCIUM 9.7 11/03/2014 1351   CALCIUM 7.9* 09/24/2014 0925     ALKPHOS 96 11/03/2014 1351   ALKPHOS 71 09/21/2014 1706   AST 26 11/03/2014 1351   AST 30 09/21/2014 1706   ALT 21 11/03/2014 1351   ALT 16 09/21/2014 1706   BILITOT 0.38 11/03/2014 1351  BILITOT 1.9* 09/21/2014 1706       No results found for: LABCA2  No components found for: [id number]  No results for input(s): INR in the last 168 hours.  Urinalysis    Component Value Date/Time   COLORURINE YELLOW 09/21/2014 1809   APPEARANCEUR CLOUDY* 09/21/2014 1809   LABSPEC 1.009 09/21/2014 1809   PHURINE 7.0 09/21/2014 1809   GLUCOSEU NEGATIVE 09/21/2014 1809   HGBUR MODERATE* 09/21/2014 1809   HGBUR negative 02/13/2008 0911   BILIRUBINUR Neg. 10/15/2014 1248   BILIRUBINUR NEGATIVE 09/21/2014 1809   KETONESUR NEGATIVE 09/21/2014 1809   PROTEINUR Neg. 10/15/2014 1248   PROTEINUR 30* 09/21/2014 1809   UROBILINOGEN 0.2 10/15/2014 1248   UROBILINOGEN 0.2 09/21/2014 1809   NITRITE Neg. 10/15/2014 1248   NITRITE POSITIVE* 09/21/2014 1809   LEUKOCYTESUR Negative 10/15/2014 1248    STUDIES: No results found.   ASSESSMENT: 64 y.o. [place] woman status post right breast biopsy 06/17/2014 for a clinical T1b N0, stage I invasive ductal carcinoma, grade 2, estrogen receptor strongly positive, progesterone receptor moderately positive, with no HER-2 amplification and an MIB-1 of 22%.  (1) status post right lumpectomy and right axillary sentinel lymph node sampling 07/21/2014 for a pT1b pN1a, stage IIA invasive ductal carcinoma, grade 1, with negative margins, repeat HER-2/neu again negative.  (2) adjuvant chemotherapy will consist of cyclophosphamide and docetaxel given every 3 weeks with Neulasta support, start date 09/15/2014. Neutropenic fever and hospitalization 5 days after the first cycle. Dose subsequently reduced.  (3) the patient will require adjuvant radiation therapy  (4) anti-estrogens to follow radiation   PLAN: Marcina has tolerated chemo remarkably well for her past  few cycles. The labs were reviewed in detail and showed mild treatment related anemia and leukocytosis from the neulasta of course.   I have referred her to Dr. Isidore Moos for a radiation consultation visit, as she is approaching the end of her chemotherapy regimen.   Allyiah will return in 2 weeks for the start of cycle 4 of cyclophosphamide and docetaxel. She understands and agrees with this plan. She knows the goal of treatment in her case is cure. She has been encouraged to call with any issues that might arise before her next visit here.  Laurie Panda, NP   11/03/2014 2:41 PM

## 2014-11-18 ENCOUNTER — Other Ambulatory Visit (HOSPITAL_BASED_OUTPATIENT_CLINIC_OR_DEPARTMENT_OTHER): Payer: Medicare Other

## 2014-11-18 ENCOUNTER — Encounter: Payer: Self-pay | Admitting: *Deleted

## 2014-11-18 ENCOUNTER — Ambulatory Visit (HOSPITAL_BASED_OUTPATIENT_CLINIC_OR_DEPARTMENT_OTHER): Payer: Medicare Other

## 2014-11-18 ENCOUNTER — Encounter: Payer: Self-pay | Admitting: Nurse Practitioner

## 2014-11-18 ENCOUNTER — Ambulatory Visit (HOSPITAL_BASED_OUTPATIENT_CLINIC_OR_DEPARTMENT_OTHER): Payer: Medicare Other | Admitting: Nurse Practitioner

## 2014-11-18 VITALS — BP 142/83 | HR 102 | Temp 98.6°F | Resp 18 | Ht 61.0 in | Wt 138.8 lb

## 2014-11-18 DIAGNOSIS — Z5111 Encounter for antineoplastic chemotherapy: Secondary | ICD-10-CM | POA: Diagnosis not present

## 2014-11-18 DIAGNOSIS — C50511 Malignant neoplasm of lower-outer quadrant of right female breast: Secondary | ICD-10-CM

## 2014-11-18 DIAGNOSIS — Z5189 Encounter for other specified aftercare: Secondary | ICD-10-CM

## 2014-11-18 LAB — COMPREHENSIVE METABOLIC PANEL (CC13)
ALBUMIN: 3.9 g/dL (ref 3.5–5.0)
ALK PHOS: 78 U/L (ref 40–150)
ALT: 20 U/L (ref 0–55)
AST: 17 U/L (ref 5–34)
Anion Gap: 14 mEq/L — ABNORMAL HIGH (ref 3–11)
BUN: 16.5 mg/dL (ref 7.0–26.0)
CALCIUM: 10.3 mg/dL (ref 8.4–10.4)
CO2: 21 mEq/L — ABNORMAL LOW (ref 22–29)
CREATININE: 1 mg/dL (ref 0.6–1.1)
Chloride: 106 mEq/L (ref 98–109)
EGFR: 63 mL/min/{1.73_m2} — ABNORMAL LOW (ref >90)
Glucose: 197 mg/dl — ABNORMAL HIGH (ref 70–140)
POTASSIUM: 4 meq/L (ref 3.5–5.1)
Sodium: 141 mEq/L (ref 136–145)
Total Bilirubin: 0.43 mg/dL (ref 0.20–1.20)
Total Protein: 6.9 g/dL (ref 6.4–8.3)

## 2014-11-18 LAB — CBC WITH DIFFERENTIAL/PLATELET
BASO%: 0.3 % (ref 0.0–2.0)
BASOS ABS: 0 10*3/uL (ref 0.0–0.1)
EOS%: 0 % (ref 0.0–7.0)
Eosinophils Absolute: 0 10*3/uL (ref 0.0–0.5)
HEMATOCRIT: 32.1 % — AB (ref 34.8–46.6)
HEMOGLOBIN: 10.9 g/dL — AB (ref 11.6–15.9)
LYMPH#: 0.6 10*3/uL — AB (ref 0.9–3.3)
LYMPH%: 4.3 % — ABNORMAL LOW (ref 14.0–49.7)
MCH: 33.7 pg (ref 25.1–34.0)
MCHC: 33.8 g/dL (ref 31.5–36.0)
MCV: 99.8 fL (ref 79.5–101.0)
MONO#: 0.5 10*3/uL (ref 0.1–0.9)
MONO%: 3.6 % (ref 0.0–14.0)
NEUT#: 13.7 10*3/uL — ABNORMAL HIGH (ref 1.5–6.5)
NEUT%: 91.8 % — ABNORMAL HIGH (ref 38.4–76.8)
Platelets: 368 10*3/uL (ref 145–400)
RBC: 3.22 10*6/uL — ABNORMAL LOW (ref 3.70–5.45)
RDW: 17.5 % — ABNORMAL HIGH (ref 11.2–14.5)
WBC: 14.9 10*3/uL — ABNORMAL HIGH (ref 3.9–10.3)

## 2014-11-18 MED ORDER — DOCETAXEL CHEMO INJECTION 160 MG/16ML
60.0000 mg/m2 | Freq: Once | INTRAVENOUS | Status: AC
  Administered 2014-11-18: 100 mg via INTRAVENOUS
  Filled 2014-11-18: qty 10

## 2014-11-18 MED ORDER — SODIUM CHLORIDE 0.9 % IV SOLN
Freq: Once | INTRAVENOUS | Status: AC
  Administered 2014-11-18: 15:00:00 via INTRAVENOUS

## 2014-11-18 MED ORDER — SODIUM CHLORIDE 0.9 % IV SOLN
500.0000 mg/m2 | Freq: Once | INTRAVENOUS | Status: AC
  Administered 2014-11-18: 800 mg via INTRAVENOUS
  Filled 2014-11-18: qty 40

## 2014-11-18 MED ORDER — HEPARIN SOD (PORK) LOCK FLUSH 100 UNIT/ML IV SOLN
500.0000 [IU] | Freq: Once | INTRAVENOUS | Status: AC | PRN
  Administered 2014-11-18: 500 [IU]
  Filled 2014-11-18: qty 5

## 2014-11-18 MED ORDER — PEGFILGRASTIM 6 MG/0.6ML ~~LOC~~ PSKT
6.0000 mg | PREFILLED_SYRINGE | Freq: Once | SUBCUTANEOUS | Status: AC
  Administered 2014-11-18: 6 mg via SUBCUTANEOUS
  Filled 2014-11-18: qty 0.6

## 2014-11-18 MED ORDER — SODIUM CHLORIDE 0.9 % IV SOLN
Freq: Once | INTRAVENOUS | Status: AC
  Administered 2014-11-18: 15:00:00 via INTRAVENOUS
  Filled 2014-11-18: qty 8

## 2014-11-18 MED ORDER — SODIUM CHLORIDE 0.9 % IJ SOLN
10.0000 mL | INTRAMUSCULAR | Status: DC | PRN
  Administered 2014-11-18: 10 mL
  Filled 2014-11-18: qty 10

## 2014-11-18 NOTE — Patient Instructions (Signed)
[facility] Discharge Instructions for Patients Receiving Chemotherapy  Today you received the following chemotherapy agents: Taxotere, Cytoxan   To help prevent nausea and vomiting after your treatment, we encourage you to take your nausea medication as directed.    If you develop nausea and vomiting that is not controlled by your nausea medication, call the clinic.   BELOW ARE SYMPTOMS THAT SHOULD BE REPORTED IMMEDIATELY:  *FEVER GREATER THAN 100.5 F  *CHILLS WITH OR WITHOUT FEVER  NAUSEA AND VOMITING THAT IS NOT CONTROLLED WITH YOUR NAUSEA MEDICATION  *UNUSUAL SHORTNESS OF BREATH  *UNUSUAL BRUISING OR BLEEDING  TENDERNESS IN MOUTH AND THROAT WITH OR WITHOUT PRESENCE OF ULCERS  *URINARY PROBLEMS  *BOWEL PROBLEMS  UNUSUAL RASH Items with * indicate a potential emergency and should be followed up as soon as possible.  Feel free to call the clinic you have any questions or concerns. The clinic phone number is [phone].  Please show the CHEMO ALERT CARD at check-in to the Emergency Department and triage nurse.   

## 2014-11-18 NOTE — Progress Notes (Signed)
[place]  [phone]) [phone] Fax:(336) [phone]     ID: Jamie Dillon DOB: October 24, 1950  MR#: [id number]  ZHY#:[id number]  Patient Care Team: Abner Greenspan, MD as PCP - General Rolm Bookbinder, MD as Consulting Physician (General Surgery) Chauncey Cruel, MD as Consulting Physician (Oncology) Eppie Gibson, MD as Attending Physician (Radiation Oncology) Rockwell Germany, RN as Registered Nurse Mauro Kaufmann, RN as Registered Nurse Holley Bouche, NP as Nurse Practitioner (Nurse Practitioner) OTHER MD:  CHIEF COMPLAINT:  Estrogen receptor positive breast cancer  CURRENT TREATMENT: adjuvant chemotherapy   BREAST CANCER HISTORY: From the original intake note:  Jamie Dillon had routine screening mammography at [facility] 06/05/2014. The breast density was category B. There was a 6 mm irregular mass in the right breast. On 06/11/2014 the patient underwent right breast ultrasonography at [facility]. This confirmed a 7 mm all her than wide irregular mass in the right blast at the 8:00 position. 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  The patient's subsequent history is as detailed below  INTERVAL HISTORY: Jamie Dillon returns today for follow-up of her breast cancer, accompanied by her husband Jamie Dillon. Today is 1, cycle 4 of 4 planned cycles of cyclophosphamide and docetaxel given every 3 weeks with Neulasta support.   REVIEW OF SYSTEMS: Jamie Dillon has few complaints today. She denies fevers, chills, nausea, vomiting, or changes in bowel or bladder habits. She is eating and drinking well. She denies mouth sores, rashes, or neuropathy symptoms. Her excessive tearing comes and goes. She maintains a good energy  level and sleeps well at night. She endorses some anxiety but not depression. A detailed review of systems is otherwise stable.  PAST MEDICAL HISTORY: Past Medical History  Diagnosis Date  . Allergic rhinitis   . HTN (hypertension)   . HLD (hyperlipidemia)   . Tobacco abuse   . Family history of malignant neoplasm of breast   . Family history of other kidney diseases   . Disorder of bone and cartilage, unspecified   . Breast cancer of lower-outer quadrant of right female breast 06/19/2014  . Anxiety   . PONV (postoperative nausea and vomiting)     PAST SURGICAL HISTORY: Past Surgical History  Procedure Laterality Date  . Total vaginal hysterectomy  1996    endometriosis  . Abdominal hysterectomy    . Appendectomy    . Colonoscopy    . Dilation and curettage of uterus    . Breast lumpectomy with needle localization and axillary sentinel lymph node bx Right 07/21/2014    Procedure: BREAST LUMPECTOMY WITH NEEDLE LOCALIZATION AND AXILLARY SENTINEL LYMPH NODE BIOPSY;  Surgeon: Rolm Bookbinder, MD;  Location: [place];  Service: General;  Laterality: Right;  . Portacath placement Right 09/10/2014    Procedure: INSERTION PORT-A-CATH;  Surgeon: Rolm Bookbinder, MD;  Location: [place];  Service: General;  Laterality: Right;    FAMILY HISTORY Family History  Problem Relation Age of Onset  . Breast cancer Sister 73  . Hypertension Mother   . Kidney disease Father   . Diabetes Paternal Aunt    the patient's father died from kidney failure at the age of 23. The patient's mother is still living at age 32. The patient had no brothers, one sister.  That sister was diagnosed with breast cancer at age 44. The patient's father's mother was diagnosed with mouth cancer at the age of 21.  GYNECOLOGIC HISTORY:  No LMP recorded. Patient has had a hysterectomy. Menarche age 70, first live birth age 41. The patient is GX P2. She had a total abdominal hysterectomy with bilateral  salpingo-oophorectomy in 1996. She took hormone replacement for approximately 10 years, until 2009. She also took oral contraceptives for approximately 7 years remotely, with no complications  SOCIAL HISTORY:  The patient is currently retired, though she still works part-time at the [employer] Her husband "Jamie Dillon" Jamie Dillon is retired from Press photographer. Son Jamie Dillon "Jamie Dillon" Jamie Dillon teaches English at [employer] in [place]. Daughter Jamie Dillon lives in [place] . She works as a Marine scientist at the [employer]    [place]: In place   HEALTH MAINTENANCE: History  Substance Use Topics  . Smoking status: Former Smoker -- 1.00 packs/day    Types: Cigarettes    Quit date: 06/25/2010  . Smokeless tobacco: Not on file  . Alcohol Use: 0.0 oz/week    0 Standard drinks or equivalent per week     Comment: occasional wine     Colonoscopy: 2010  PAP:  Bone density: December 2015  Lipid panel:  No Known Allergies  Current Outpatient Prescriptions  Medication Sig Dispense Refill  . ALPRAZolam (XANAX) 0.5 MG tablet Take 1 tablet (0.5 mg total) by mouth daily as needed for anxiety. 30 tablet 1  . atorvastatin (LIPITOR) 10 MG tablet Take 1 tablet (10 mg total) by mouth daily. 30 tablet 11  . b complex vitamins tablet Take 1 tablet by mouth daily.     . Calcium Carbonate-Vitamin D (CALCIUM-VITAMIN D) 500-200 MG-UNIT per tablet Take 3 tablets by mouth every morning.      . cetirizine (ZYRTEC) 10 MG tablet Take 10 mg by mouth daily as needed for allergies.     Marland Kitchen dexamethasone (DECADRON) 4 MG tablet Take 2 tablets (8 mg total) by mouth 2 (two) times daily. Start the day before Taxotere. Then again the day after chemo for 3 days. 30 tablet 1  . diphenhydrAMINE (BENADRYL) 25 MG tablet Take 25 mg by mouth daily as needed for allergies.     . metoprolol succinate (TOPROL-XL) 50 MG 24 hr tablet Take 1 tablet (50 mg total) by mouth daily. Take with or  immediately following a meal. 30 tablet 11  . Multiple Vitamin (MULTIVITAMIN) tablet Take 1 tablet by mouth daily.      . ondansetron (ZOFRAN) 8 MG tablet Take twice a day. Start the evening of chemo day and continue for 3 days. Then take as needed for nausea or vomiting. 30 tablet 1  . pegfilgrastim (NEULASTA ONPRO KIT) 6 MG/0.6ML injection Inject 6 mg into the skin once. Inject via provided programmed delivery device.    Marland Kitchen PRESCRIPTION MEDICATION Chemo - [facility]    . prochlorperazine (COMPAZINE) 10 MG tablet Take one tablet 4 times a day (before meals and at bedtime) starting the evening of chemo day and continuing for 3 days for nausea 30 tablet 1  . tobramycin-dexamethasone (TOBRADEX) ophthalmic solution Place 1 drop into both eyes 2 (two) times daily. 5 mL 0  . UNABLE TO FIND 1 each by Other route once. Dispense per medical necessity cranial prothesis due to alopecia induced by chemotherapy for breast cancer diagnosis 1 each 1  . feeding supplement, ENSURE ENLIVE, (ENSURE ENLIVE) LIQD Take 237 mLs  by mouth 2 (two) times daily between meals. (Patient not taking: Reported on 10/27/2014) 237 mL 12  . fluticasone (FLONASE) 50 MCG/ACT nasal spray Place 2 sprays into both nostrils daily. (Patient not taking: Reported on 11/03/2014) 16 g 6  . oxyCODONE-acetaminophen (PERCOCET) 10-325 MG per tablet Take 1 tablet by mouth every 6 (six) hours as needed for pain. (Patient not taking: Reported on 10/27/2014) 10 tablet 0   No current facility-administered medications for this visit.    OBJECTIVE: Middle-aged white woman in no acute distress Filed Vitals:   11/18/14 1325  BP: 142/83  Pulse: 102  Temp: 98.6 F (37 C)  Resp: 18     Body mass index is 26.24 kg/(m^2).    ECOG FS:0 - Asymptomatic   Sclerae unicteric, pupils round and equal Oropharynx clear and moist-- no thrush or other lesions No cervical or supraclavicular adenopathy Lungs no rales or rhonchi Heart regular rate and rhythm Abd soft,  nontender, positive bowel sounds MSK no focal spinal tenderness, no upper extremity lymphedema Neuro: nonfocal, well oriented, appropriate affect Breasts: deferred  LAB RESULTS:  CMP     Component Value Date/Time   NA 141 11/18/2014 1307   NA 135 09/24/2014 0925   K 4.0 11/18/2014 1307   K 3.5 09/24/2014 0925   CL 103 09/24/2014 0925   CO2 21* 11/18/2014 1307   CO2 23 09/24/2014 0925   GLUCOSE 197* 11/18/2014 1307   GLUCOSE 127* 09/24/2014 0925   BUN 16.5 11/18/2014 1307   BUN 5* 09/24/2014 0925   CREATININE 1.0 11/18/2014 1307   CREATININE 0.79 09/24/2014 0925   CALCIUM 10.3 11/18/2014 1307   CALCIUM 7.9* 09/24/2014 0925   PROT 6.9 11/18/2014 1307   PROT 6.6 09/21/2014 1706   ALBUMIN 3.9 11/18/2014 1307   ALBUMIN 3.2* 09/21/2014 1706   AST 17 11/18/2014 1307   AST 30 09/21/2014 1706   ALT 20 11/18/2014 1307   ALT 16 09/21/2014 1706   ALKPHOS 78 11/18/2014 1307   ALKPHOS 71 09/21/2014 1706   BILITOT 0.43 11/18/2014 1307   BILITOT 1.9* 09/21/2014 1706   GFRNONAA >60 09/24/2014 0925   GFRAA >60 09/24/2014 0925    INo results found for: SPEP, UPEP  Lab Results  Component Value Date   WBC 14.9* 11/18/2014   NEUTROABS 13.7* 11/18/2014   HGB 10.9* 11/18/2014   HCT 32.1* 11/18/2014   MCV 99.8 11/18/2014   PLT 368 11/18/2014      Chemistry      Component Value Date/Time   NA 141 11/18/2014 1307   NA 135 09/24/2014 0925   K 4.0 11/18/2014 1307   K 3.5 09/24/2014 0925   CL 103 09/24/2014 0925   CO2 21* 11/18/2014 1307   CO2 23 09/24/2014 0925   BUN 16.5 11/18/2014 1307   BUN 5* 09/24/2014 0925   CREATININE 1.0 11/18/2014 1307   CREATININE 0.79 09/24/2014 0925      Component Value Date/Time   CALCIUM 10.3 11/18/2014 1307   CALCIUM 7.9* 09/24/2014 0925   ALKPHOS 78 11/18/2014 1307   ALKPHOS 71 09/21/2014 1706   AST 17 11/18/2014 1307   AST 30 09/21/2014 1706   ALT 20 11/18/2014 1307   ALT 16 09/21/2014 1706   BILITOT 0.43 11/18/2014 1307   BILITOT  1.9* 09/21/2014 1706       No results found for: LABCA2  No components found for: LABCA125  No results for input(s): INR in the last 168 hours.  Urinalysis    Component  Value Date/Time   COLORURINE YELLOW 09/21/2014 1809   APPEARANCEUR CLOUDY* 09/21/2014 1809   LABSPEC 1.009 09/21/2014 1809   PHURINE 7.0 09/21/2014 1809   GLUCOSEU NEGATIVE 09/21/2014 1809   HGBUR MODERATE* 09/21/2014 1809   HGBUR negative 02/13/2008 0911   BILIRUBINUR Neg. 10/15/2014 1248   BILIRUBINUR NEGATIVE 09/21/2014 1809   KETONESUR NEGATIVE 09/21/2014 1809   PROTEINUR Neg. 10/15/2014 1248   PROTEINUR 30* 09/21/2014 1809   UROBILINOGEN 0.2 10/15/2014 1248   UROBILINOGEN 0.2 09/21/2014 1809   NITRITE Neg. 10/15/2014 1248   NITRITE POSITIVE* 09/21/2014 1809   LEUKOCYTESUR Negative 10/15/2014 1248    STUDIES: No results found.   ASSESSMENT: 64 y.o. [place] woman status post right breast biopsy 06/17/2014 for a clinical T1b N0, stage I invasive ductal carcinoma, grade 2, estrogen receptor strongly positive, progesterone receptor moderately positive, with no HER-2 amplification and an MIB-1 of 22%.  (1) status post right lumpectomy and right axillary sentinel lymph node sampling 07/21/2014 for a pT1b pN1a, stage IIA invasive ductal carcinoma, grade 1, with negative margins, repeat HER-2/neu again negative.  (2) adjuvant chemotherapy will consist of cyclophosphamide and docetaxel given every 3 weeks with Neulasta support, start date 09/15/2014. Neutropenic fever and hospitalization 5 days after the first cycle. Dose subsequently reduced.  (3) the patient will require adjuvant radiation therapy  (4) anti-estrogens to follow radiation   PLAN: Goddess looks and feels well today. The labs were reviewed in detail and were entirely stable. She will proceed with her 4th and final cycle of cyclophosphamide and docetaxel as planned today.    Takyla will return in 1 week for labs and a nadir visit. She  understands and agrees with this plan. She knows the goal of treatment in her case is cure. She has been encouraged to call with any issues that might arise before her next visit here.  Laurie Panda, NP   11/18/2014 2:01 PM

## 2014-11-24 ENCOUNTER — Other Ambulatory Visit (HOSPITAL_BASED_OUTPATIENT_CLINIC_OR_DEPARTMENT_OTHER): Payer: Medicare Other

## 2014-11-24 ENCOUNTER — Ambulatory Visit (HOSPITAL_BASED_OUTPATIENT_CLINIC_OR_DEPARTMENT_OTHER): Payer: Medicare Other | Admitting: Nurse Practitioner

## 2014-11-24 ENCOUNTER — Encounter: Payer: Self-pay | Admitting: Nurse Practitioner

## 2014-11-24 VITALS — BP 129/73 | HR 103 | Temp 98.4°F | Resp 18 | Ht 61.0 in | Wt 137.8 lb

## 2014-11-24 DIAGNOSIS — Z170 Estrogen receptor positive status [ER+]: Secondary | ICD-10-CM

## 2014-11-24 DIAGNOSIS — C50511 Malignant neoplasm of lower-outer quadrant of right female breast: Secondary | ICD-10-CM

## 2014-11-24 LAB — COMPREHENSIVE METABOLIC PANEL (CC13)
ALT: 15 U/L (ref 0–55)
AST: 15 U/L (ref 5–34)
Albumin: 3.2 g/dL — ABNORMAL LOW (ref 3.5–5.0)
Alkaline Phosphatase: 84 U/L (ref 40–150)
Anion Gap: 10 mEq/L (ref 3–11)
BILIRUBIN TOTAL: 0.68 mg/dL (ref 0.20–1.20)
BUN: 11 mg/dL (ref 7.0–26.0)
CO2: 25 mEq/L (ref 22–29)
CREATININE: 0.8 mg/dL (ref 0.6–1.1)
Calcium: 9.1 mg/dL (ref 8.4–10.4)
Chloride: 104 mEq/L (ref 98–109)
EGFR: 81 mL/min/{1.73_m2} — AB (ref >90)
Glucose: 125 mg/dl (ref 70–140)
Potassium: 3.6 mEq/L (ref 3.5–5.1)
SODIUM: 139 meq/L (ref 136–145)
TOTAL PROTEIN: 5.8 g/dL — AB (ref 6.4–8.3)

## 2014-11-24 LAB — CBC WITH DIFFERENTIAL/PLATELET
BASO%: 0.6 % (ref 0.0–2.0)
Basophils Absolute: 0 10*3/uL (ref 0.0–0.1)
EOS%: 5.1 % (ref 0.0–7.0)
Eosinophils Absolute: 0.2 10*3/uL (ref 0.0–0.5)
HEMATOCRIT: 32.1 % — AB (ref 34.8–46.6)
HGB: 10.6 g/dL — ABNORMAL LOW (ref 11.6–15.9)
LYMPH%: 30 % (ref 14.0–49.7)
MCH: 33.4 pg (ref 25.1–34.0)
MCHC: 32.9 g/dL (ref 31.5–36.0)
MCV: 101.5 fL — AB (ref 79.5–101.0)
MONO#: 0.6 10*3/uL (ref 0.1–0.9)
MONO%: 20.4 % — ABNORMAL HIGH (ref 0.0–14.0)
NEUT%: 43.9 % (ref 38.4–76.8)
NEUTROS ABS: 1.3 10*3/uL — AB (ref 1.5–6.5)
PLATELETS: 162 10*3/uL (ref 145–400)
RBC: 3.17 10*6/uL — ABNORMAL LOW (ref 3.70–5.45)
RDW: 16.8 % — ABNORMAL HIGH (ref 11.2–14.5)
WBC: 2.9 10*3/uL — AB (ref 3.9–10.3)
lymph#: 0.9 10*3/uL (ref 0.9–3.3)

## 2014-11-24 NOTE — Progress Notes (Signed)
[place]  Telephone:(336) [phone] Fax:(336) [phone]     ID: Jamie Dillon DOB: 10/19/1950  MR#: [id number]  ZHY#:[id number]  Patient Care Team: Abner Greenspan, MD as PCP - General Rolm Bookbinder, MD as Consulting Physician (General Surgery) Chauncey Cruel, MD as Consulting Physician (Oncology) Eppie Gibson, MD as Attending Physician (Radiation Oncology) Rockwell Germany, RN as Registered Nurse Mauro Kaufmann, RN as Registered Nurse Holley Bouche, NP as Nurse Practitioner (Nurse Practitioner) OTHER MD:  CHIEF COMPLAINT:  Estrogen receptor positive breast cancer  CURRENT TREATMENT: adjuvant chemotherapy   BREAST CANCER HISTORY: From the original intake note:  Deborh had routine screening mammography at [facility] 06/05/2014. The breast density was category B. There was a 6 mm irregular mass in the right breast. On 06/11/2014 the patient underwent right breast ultrasonography at [facility] This confirmed a 7 mm all her than wide irregular mass in the right blast at the 8:00 position. 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  The patient's subsequent history is as detailed below  INTERVAL HISTORY: Bethzaida returns today for follow-up of her breast cancer, accompanied by her husband [name]. Today is 64, cycle 4 of 4 planned cycles of cyclophosphamide and docetaxel given every 3 weeks with Neulasta support.   REVIEW OF SYSTEMS: Timaya had more bone pain this cycle than usual. She used a combination of claritin and ibuprofen and this is starting to fade. She denies fevers, chills, nausea, vomiting, or changes in bowel or bladder habits. She is eating and drinking well. She denies mouth sores,  rashes, or neuropathy symptoms. She had some fatigue during day 2 but this is improving. She endorses some anxiety but not depression. A detailed review of systems is otherwise stable.  PAST MEDICAL HISTORY: Past Medical History  Diagnosis Date  . Allergic rhinitis   . HTN (hypertension)   . HLD (hyperlipidemia)   . Tobacco abuse   . Family history of malignant neoplasm of breast   . Family history of other kidney diseases   . Disorder of bone and cartilage, unspecified   . Breast cancer of lower-outer quadrant of right female breast 06/19/2014  . Anxiety   . PONV (postoperative nausea and vomiting)     PAST SURGICAL HISTORY: Past Surgical History  Procedure Laterality Date  . Total vaginal hysterectomy  1996    endometriosis  . Abdominal hysterectomy    . Appendectomy    . Colonoscopy    . Dilation and curettage of uterus    . Breast lumpectomy with needle localization and axillary sentinel lymph node bx Right 07/21/2014    Procedure: BREAST LUMPECTOMY WITH NEEDLE LOCALIZATION AND AXILLARY SENTINEL LYMPH NODE BIOPSY;  Surgeon: Rolm Bookbinder, MD;  Location: [place];  Service: General;  Laterality: Right;  . Portacath placement Right 09/10/2014    Procedure: INSERTION PORT-A-CATH;  Surgeon: Rolm Bookbinder, MD;  Location: [place];  Service: General;  Laterality: Right;    FAMILY HISTORY Family History  Problem Relation Age of Onset  . Breast cancer Sister 54  . Hypertension Mother   . Kidney disease Father   . Diabetes Paternal Aunt    the patient's father died from kidney failure at the age of 51. The patient's mother is  still living at age 80. The patient had no brothers, one sister. That sister was diagnosed with breast cancer at age 72. The patient's father's mother was diagnosed with mouth cancer at the age of 48.  GYNECOLOGIC HISTORY:  No LMP recorded. Patient has had a hysterectomy. Menarche age 45, first live birth age 16. The patient is GX P2. She  had a total abdominal hysterectomy with bilateral salpingo-oophorectomy in 1996. She took hormone replacement for approximately 10 years, until 2009. She also took oral contraceptives for approximately 7 years remotely, with no complications  SOCIAL HISTORY:  The patient is currently retired, though she still works part-time at the [employer] Her husband "[name]" Nile Dorning is retired from Press photographer. Son [name] "[name]" Grego teaches English at [employer] in [place]. Daughter [name] lives in [place] . She works as a Marine scientist at the [employer]    [place]: In place   HEALTH MAINTENANCE: History  Substance Use Topics  . Smoking status: Former Smoker -- 1.00 packs/day    Types: Cigarettes    Quit date: 06/25/2010  . Smokeless tobacco: Not on file  . Alcohol Use: 0.0 oz/week    0 Standard drinks or equivalent per week     Comment: occasional wine     Colonoscopy: 2010  PAP:  Bone density: December 2015  Lipid panel:  No Known Allergies  Current Outpatient Prescriptions  Medication Sig Dispense Refill  . ALPRAZolam (XANAX) 0.5 MG tablet Take 1 tablet (0.5 mg total) by mouth daily as needed for anxiety. 30 tablet 1  . atorvastatin (LIPITOR) 10 MG tablet Take 1 tablet (10 mg total) by mouth daily. 30 tablet 11  . b complex vitamins tablet Take 1 tablet by mouth daily.     . Calcium Carbonate-Vitamin D (CALCIUM-VITAMIN D) 500-200 MG-UNIT per tablet Take 3 tablets by mouth every morning.      . cetirizine (ZYRTEC) 10 MG tablet Take 10 mg by mouth daily as needed for allergies.     . diphenhydrAMINE (BENADRYL) 25 MG tablet Take 25 mg by mouth daily as needed for allergies.     . metoprolol succinate (TOPROL-XL) 50 MG 24 hr tablet Take 1 tablet (50 mg total) by mouth daily. Take with or immediately following a meal. 30 tablet 11  . Multiple Vitamin (MULTIVITAMIN) tablet Take 1 tablet by mouth daily.      Marland Kitchen  tobramycin-dexamethasone (TOBRADEX) ophthalmic solution Place 1 drop into both eyes 2 (two) times daily. 5 mL 0  . UNABLE TO FIND 1 each by Other route once. Dispense per medical necessity cranial prothesis due to alopecia induced by chemotherapy for breast cancer diagnosis 1 each 1  . feeding supplement, ENSURE ENLIVE, (ENSURE ENLIVE) LIQD Take 237 mLs by mouth 2 (two) times daily between meals. (Patient not taking: Reported on 10/27/2014) 237 mL 12  . fluticasone (FLONASE) 50 MCG/ACT nasal spray Place 2 sprays into both nostrils daily. (Patient not taking: Reported on 11/03/2014) 16 g 6  . oxyCODONE-acetaminophen (PERCOCET) 10-325 MG per tablet Take 1 tablet by mouth every 6 (six) hours as needed for pain. (Patient not taking: Reported on 10/27/2014) 10 tablet 0  . pegfilgrastim (NEULASTA ONPRO KIT) 6 MG/0.6ML injection Inject 6 mg into the skin once. Inject via provided programmed delivery device.    Marland Kitchen PRESCRIPTION MEDICATION Chemo - [facility]     No current facility-administered medications for this visit.    OBJECTIVE: Middle-aged white woman in no acute distress Filed  Vitals:   11/24/14 1312  BP: 129/73  Pulse: 103  Temp: 98.4 F (36.9 C)  Resp: 18     Body mass index is 26.05 kg/(m^2).    ECOG FS:0 - Asymptomatic  Skin: warm, dry  HEENT: sclerae anicteric, conjunctivae pink, oropharynx clear. No thrush or mucositis.  Lymph Nodes: No cervical or supraclavicular lymphadenopathy  Lungs: clear to auscultation bilaterally, no rales, wheezes, or rhonci  Heart: regular rate and rhythm  Abdomen: round, soft, non tender, positive bowel sounds  Musculoskeletal: No focal spinal tenderness, no peripheral edema  Neuro: non focal, well oriented, positive affect  Breasts: deferred  LAB RESULTS:  CMP     Component Value Date/Time   NA 139 11/24/2014 1251   NA 135 09/24/2014 0925   K 3.6 11/24/2014 1251   K 3.5 09/24/2014 0925   CL 103 09/24/2014 0925   CO2 25 11/24/2014 1251   CO2 23  09/24/2014 0925   GLUCOSE 125 11/24/2014 1251   GLUCOSE 127* 09/24/2014 0925   BUN 11.0 11/24/2014 1251   BUN 5* 09/24/2014 0925   CREATININE 0.8 11/24/2014 1251   CREATININE 0.79 09/24/2014 0925   CALCIUM 9.1 11/24/2014 1251   CALCIUM 7.9* 09/24/2014 0925   PROT 5.8* 11/24/2014 1251   PROT 6.6 09/21/2014 1706   ALBUMIN 3.2* 11/24/2014 1251   ALBUMIN 3.2* 09/21/2014 1706   AST 15 11/24/2014 1251   AST 30 09/21/2014 1706   ALT 15 11/24/2014 1251   ALT 16 09/21/2014 1706   ALKPHOS 84 11/24/2014 1251   ALKPHOS 71 09/21/2014 1706   BILITOT 0.68 11/24/2014 1251   BILITOT 1.9* 09/21/2014 1706   GFRNONAA >60 09/24/2014 0925   GFRAA >60 09/24/2014 0925    INo results found for: SPEP, UPEP  Lab Results  Component Value Date   WBC 2.9* 11/24/2014   NEUTROABS 1.3* 11/24/2014   HGB 10.6* 11/24/2014   HCT 32.1* 11/24/2014   MCV 101.5* 11/24/2014   PLT 162 11/24/2014      Chemistry      Component Value Date/Time   NA 139 11/24/2014 1251   NA 135 09/24/2014 0925   K 3.6 11/24/2014 1251   K 3.5 09/24/2014 0925   CL 103 09/24/2014 0925   CO2 25 11/24/2014 1251   CO2 23 09/24/2014 0925   BUN 11.0 11/24/2014 1251   BUN 5* 09/24/2014 0925   CREATININE 0.8 11/24/2014 1251   CREATININE 0.79 09/24/2014 0925      Component Value Date/Time   CALCIUM 9.1 11/24/2014 1251   CALCIUM 7.9* 09/24/2014 0925   ALKPHOS 84 11/24/2014 1251   ALKPHOS 71 09/21/2014 1706   AST 15 11/24/2014 1251   AST 30 09/21/2014 1706   ALT 15 11/24/2014 1251   ALT 16 09/21/2014 1706   BILITOT 0.68 11/24/2014 1251   BILITOT 1.9* 09/21/2014 1706       No results found for: LABCA2  No components found for: LABCA125  No results for input(s): INR in the last 168 hours.  Urinalysis    Component Value Date/Time   COLORURINE YELLOW 09/21/2014 1809   APPEARANCEUR CLOUDY* 09/21/2014 1809   LABSPEC 1.009 09/21/2014 1809   PHURINE 7.0 09/21/2014 1809   GLUCOSEU NEGATIVE 09/21/2014 1809   HGBUR  MODERATE* 09/21/2014 1809   HGBUR negative 02/13/2008 0911   BILIRUBINUR Neg. 10/15/2014 [place] 09/21/2014 1809   KETONESUR NEGATIVE 09/21/2014 1809   PROTEINUR Neg. 10/15/2014 1248   PROTEINUR 30* 09/21/2014 1809   UROBILINOGEN  0.2 10/15/2014 1248   UROBILINOGEN 0.2 09/21/2014 1809   NITRITE Neg. 10/15/2014 1248   NITRITE POSITIVE* 09/21/2014 1809   LEUKOCYTESUR Negative 10/15/2014 1248    STUDIES: No results found.   ASSESSMENT: 64 y.o.  woman status post right breast biopsy 06/17/2014 for a clinical T1b N0, stage I invasive ductal carcinoma, grade 2, estrogen receptor strongly positive, progesterone receptor moderately positive, with no HER-2 amplification and an MIB-1 of 22%.  (1) status post right lumpectomy and right axillary sentinel lymph node sampling 07/21/2014 for a pT1b pN1a, stage IIA invasive ductal carcinoma, grade 1, with negative margins, repeat HER-2/neu again negative.  (2) adjuvant chemotherapy will consist of cyclophosphamide and docetaxel given every 3 weeks with Neulasta support, start date 09/15/2014. Neutropenic fever and hospitalization 5 days after the first cycle. Dose subsequently reduced.  (3) the patient will require adjuvant radiation therapy  (4) anti-estrogens to follow radiation   PLAN: Lynley is happy to have completed chemotherapy. The labs were reviewed in detail and were stable, with the expected drop in neutrophil count.   She is scheduled to meet with with Dr. Isidore Moos this Friday to discuss radiation plans.  Kawanna will return in 7 week for follow up with Dr. Jana Hakim. At this visit they will discuss antiestrogen therapy. She has already had a baseline bone density scan. She understands and agrees with this plan. She knows the goal of treatment in her case is cure. She has been encouraged to call with any issues that might arise before her next visit here.  Laurie Panda, NP   11/24/2014 1:48 PM

## 2014-11-26 NOTE — Progress Notes (Signed)
Location of Breast Cancer:  lower outer quadrant of right breast  Histology per Pathology Report:  Receptor Status: ER(98%+), PR (7%+), Her2-neu (-)  Did patient present with symptoms (if so, please note symptoms) or was this found on screening mammography?: Routine Mammography on 06/05/2014  Past/Anticipated interventions by surgeon, if any: 07/21/14: right breast lumpectomy with needle localization and axillary sentinel lymph node biopsy  Past/Anticipated interventions by medical oncology, if any: Chemotherapy, 09/15/2014: cyclophosphamide and docetaxel given every 3 weeks with Neulasta support.  Neutropenic fever and hospitalization 5 days after the fist cycle.  Dose subsequently reduced.    Lymphedema issues, if any: No  Pain issues, if any:No  SAFETY ISSUES:  Prior radiation? No     Pacemaker/ICD? No  Possible current pregnancy?No  Is the patient on methotrexate? No  Current Complaints / other details:  Sister diagnosed with breast cancer at 60,   Menarche age 14, first live birth age 42, 2 children. She had a total abdominal hysterectomy with bilateral salpingo-oophorectomy in 1996. She took hormone replacement for approximately 10 years, until 2009. She also took oral contraceptives for approximately 7 years remotely, with no complications BP 483/23 mmHg  Pulse 96  Temp(Src) 98 F (36.7 C)  Wt 138 lb 12.8 oz (62.959 kg)    Jenene Slicker, RN 11/26/2014,4:54 PM

## 2014-11-28 ENCOUNTER — Ambulatory Visit
Admission: RE | Admit: 2014-11-28 | Discharge: 2014-11-28 | Disposition: A | Discharge status: Home / Self Care | Payer: Medicare Other | Source: Ambulatory Visit | Attending: Radiation Oncology | Admitting: Radiation Oncology

## 2014-11-28 ENCOUNTER — Encounter: Payer: Self-pay | Admitting: Radiation Oncology

## 2014-11-28 VITALS — BP 149/88 | HR 96 | Temp 98.0°F | Wt 138.8 lb

## 2014-11-28 DIAGNOSIS — C50511 Malignant neoplasm of lower-outer quadrant of right female breast: Secondary | ICD-10-CM | POA: Insufficient documentation

## 2014-11-28 DIAGNOSIS — Z510 Encounter for antineoplastic radiation therapy: Secondary | ICD-10-CM | POA: Insufficient documentation

## 2014-11-28 NOTE — Addendum Note (Signed)
Encounter addended by: Norm Salt, RN on: 11/28/2014  3:52 PM<BR>     Documentation filed: Charges VN

## 2014-11-28 NOTE — Progress Notes (Signed)
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 Breast cancer of lower-outer quadrant of right female breast 174.5 C50.511     Narrative:  The patient returns today for routine follow-up. The pt underwent right lumpectomy and sentinel lymph node biopsy on 07/21/14 with regional right axillary resection. 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 Margins are negative and the tumor was 0.8 cm. 1 of 4 nodes were positive, but there was no ECE. Her receptor status is ER 98%, PR 7%, and HER2 negative. She has received chemotherapy in the form of cyclophosphamide and docetaxel every 3 weeks.  The pt states she completed chemotherapy on July 5. The pt states she has shooting breast pains at times. She has no additional complaints  ALLERGIES:  has No Known Allergies.  Meds: Current Outpatient Prescriptions  Medication Sig Dispense Refill  . ALPRAZolam (XANAX) 0.5 MG tablet Take 1 tablet (0.5 mg total) by mouth daily as needed for anxiety. 30 tablet 1  . atorvastatin (LIPITOR) 10 MG tablet Take 1 tablet (10 mg total) by mouth daily. 30 tablet 11  . b complex vitamins tablet Take 1 tablet by mouth daily.     . Calcium Carbonate-Vitamin D (CALCIUM-VITAMIN D) 500-200 MG-UNIT per tablet Take 3 tablets by mouth every morning.      . cetirizine (ZYRTEC) 10 MG tablet Take 10 mg by mouth daily as needed for allergies.     . diphenhydrAMINE (BENADRYL) 25 MG tablet Take 25 mg by mouth daily as needed for allergies.     . metoprolol succinate (TOPROL-XL) 50 MG 24 hr tablet Take 1 tablet (50 mg total) by mouth daily. Take with or immediately following a meal. 30 tablet 11    . Multiple Vitamin (MULTIVITAMIN) tablet Take 1 tablet by mouth daily.      Marland Kitchen tobramycin-dexamethasone (TOBRADEX) ophthalmic solution Place 1 drop into both eyes 2 (two) times daily. 5 mL 0  . UNABLE TO FIND 1 each by Other route once. Dispense per medical necessity cranial prothesis due to alopecia induced by chemotherapy for breast cancer diagnosis 1 each 1  . fluticasone (FLONASE) 50 MCG/ACT nasal spray Place 2 sprays into both nostrils daily. (Patient not taking: Reported on 11/03/2014) 16 g 6  . oxyCODONE-acetaminophen (PERCOCET) 10-325 MG per tablet Take 1 tablet by mouth every 6 (six) hours as needed for pain. (Patient not taking: Reported on 10/27/2014) 10 tablet 0   No current facility-administered medications for this encounter.    Physical Findings: The patient is in no acute distress. Patient is alert and oriented.  weight is 138 lb 12.8 oz (62.959 kg). Her temperature is 98 F (36.7 C). Her blood pressure is 149/88 and her pulse is 96.   General: Alert and oriented, in no acute distress HEENT: Head is normocephalic. [place]ck: Neck is supple, no palpable cervical or supraclavicular lymphadenopathy. Heart: Regular in rate and rhythm with no murmurs, rubs, or gallops. Chest: upper right PAC. Clear to auscultation bilaterally, with no rhonchi, wheezes, or rales. Abdomen: Soft, nontender, nondistended, with no rigidity or guarding. Extremities: No cyanosis or edema. Lymphatics: see Neck Exam Skin:  No concerning lesions. Musculoskeletal: ambulatory Neurologic: Cranial nerves II through XII are grossly intact. No obvious focalities. Speech is fluent.   Psychiatric: Judgment and insight are intact. Affect is appropriate. BREASTS: well healed right axillary and lumpectomy sites; no palpable masses b/l    Lab Findings: Lab Results  Component Value Date   WBC 2.9* 11/24/2014   HGB 10.6* 11/24/2014   HCT 32.1* 11/24/2014   MCV 101.5* 11/24/2014   PLT 162 11/24/2014     Radiographic Findings: No results found.  Impression/Plan: It was a pleasure meeting the patient today. We discussed the risks, benefits, and side effects of radiotherapy to the right breast and regional nodes. We discussed that radiation would take approximately 6 weeks to complete and that I would give the patient a few weeks to heal following surgery before starting treatment planning. We spoke about acute effects including skin irritation and fatigue as well as much less common late effects including lymphedema and lung  irritation. We spoke about the latest technology that is used to minimize the risk of late effects for breast cancer patients undergoing radiotherapy. No guarantees of treatment were given. The patient is enthusiastic about proceeding with treatment. I look forward to participating in the patient's care.  Her sim is scheduled on July 18 and anticipate her radiotherapy to start on July 27.   This document serves as a record of services personally performed by Eppie Gibson, MD. It was created on her behalf by Darcus Austin, a trained medical scribe. The creation of this record is based on the scribe's personal observations and the provider's statements to them. This document has been checked and approved by the attending provider.    _____________________________________   Eppie Gibson, MD

## 2014-11-28 NOTE — Progress Notes (Signed)
Please see the Nurse Progress Note in the MD Initial Consult Encounter for this patient. 

## 2014-12-01 ENCOUNTER — Ambulatory Visit
Admission: RE | Admit: 2014-12-01 | Discharge: 2014-12-01 | Disposition: A | Discharge status: Home / Self Care | Payer: Medicare Other | Source: Ambulatory Visit | Attending: Radiation Oncology | Admitting: Radiation Oncology

## 2014-12-01 DIAGNOSIS — Z510 Encounter for antineoplastic radiation therapy: Secondary | ICD-10-CM | POA: Diagnosis not present

## 2014-12-01 DIAGNOSIS — C50511 Malignant neoplasm of lower-outer quadrant of right female breast: Secondary | ICD-10-CM

## 2014-12-01 NOTE — Progress Notes (Signed)
  Radiation Oncology         ([phone]) [phone] ________________________________  Name: Jamie Dillon MRN: [id number]  Date: 12/01/2014  DOB: 1950/12/29  SIMULATION AND TREATMENT PLANNING NOTE    Outpatient  DIAGNOSIS:     ICD-9-CM ICD-10-CM   1. Breast cancer of lower-outer quadrant of right female breast 174.5 C50.511     NARRATIVE:  The patient was brought to the [facility].  Identity was confirmed.  All relevant records and images related to the planned course of therapy were reviewed.  The patient freely provided informed written consent to proceed with treatment after reviewing the details related to the planned course of therapy. The consent form was witnessed and verified by the simulation staff.    Then, the patient was set-up in a stable reproducible supine position for radiation therapy with her ipsilateral arm over her head, and her upper body secured in a custom-made Vac-lok device.  CT images were obtained.  Surface markings were placed.  The CT images were loaded into the planning software.    TREATMENT PLANNING NOTE: Treatment planning then occurred.  The radiation prescription was entered and confirmed.     A total of 3 medically necessary complex treatment devices were fabricated and supervised by me: 2 fields with MLCs for custom blocks to protect heart, and lungs;  and, a Vac-lok. I have requested : 3D Simulation  I have requested a DVH of the following structures: lungs, heart, lumpectomy cavity.    The patient will receive 50 Gy in 25 fractions to the R breast and axilla with 2 high tangential fields.    This will   be followed by a boost.  Optical Surface Tracking Plan:  Since intensity modulated radiotherapy (IMRT) and 3D conformal radiation treatment methods are predicated on accurate and precise positioning for treatment, intrafraction motion monitoring is medically necessary to ensure accurate and safe treatment delivery. The ability to quantify  intrafraction motion without excessive ionizing radiation dose can only be performed with optical surface tracking. Accordingly, surface imaging offers the opportunity to obtain 3D measurements of patient position throughout IMRT and 3D treatments without excessive radiation exposure. I am ordering optical surface tracking for this patient's upcoming course of radiotherapy.  ________________________________   Reference:  Ursula Alert, J, et al. Surface imaging-based analysis of intrafraction motion for breast radiotherapy patients.Journal of [place], n. 6, nov. 2014. ISSN [id number].  Available at: <http://www.jacmp.org/index.php/jacmp/article/view/4957>.    -----------------------------------  Eppie Gibson, MD

## 2014-12-03 DIAGNOSIS — Z510 Encounter for antineoplastic radiation therapy: Secondary | ICD-10-CM | POA: Diagnosis not present

## 2014-12-05 DIAGNOSIS — Z510 Encounter for antineoplastic radiation therapy: Secondary | ICD-10-CM | POA: Diagnosis not present

## 2014-12-09 ENCOUNTER — Ambulatory Visit
Admission: RE | Admit: 2014-12-09 | Discharge: 2014-12-09 | Disposition: A | Discharge status: Home / Self Care | Payer: Medicare Other | Source: Ambulatory Visit | Attending: Radiation Oncology | Admitting: Radiation Oncology

## 2014-12-09 DIAGNOSIS — Z510 Encounter for antineoplastic radiation therapy: Secondary | ICD-10-CM | POA: Diagnosis not present

## 2014-12-10 ENCOUNTER — Ambulatory Visit
Admission: RE | Admit: 2014-12-10 | Discharge: 2014-12-10 | Disposition: A | Discharge status: Home / Self Care | Payer: Medicare Other | Source: Ambulatory Visit | Attending: Radiation Oncology | Admitting: Radiation Oncology

## 2014-12-10 DIAGNOSIS — Z510 Encounter for antineoplastic radiation therapy: Secondary | ICD-10-CM | POA: Diagnosis not present

## 2014-12-11 ENCOUNTER — Ambulatory Visit
Admission: RE | Admit: 2014-12-11 | Discharge: 2014-12-11 | Disposition: A | Discharge status: Home / Self Care | Payer: Medicare Other | Source: Ambulatory Visit | Attending: Radiation Oncology | Admitting: Radiation Oncology

## 2014-12-11 DIAGNOSIS — Z510 Encounter for antineoplastic radiation therapy: Secondary | ICD-10-CM | POA: Diagnosis not present

## 2014-12-12 ENCOUNTER — Ambulatory Visit
Admission: RE | Admit: 2014-12-12 | Discharge: 2014-12-12 | Disposition: A | Discharge status: Home / Self Care | Payer: Medicare Other | Source: Ambulatory Visit | Attending: Radiation Oncology | Admitting: Radiation Oncology

## 2014-12-12 DIAGNOSIS — Z510 Encounter for antineoplastic radiation therapy: Secondary | ICD-10-CM | POA: Diagnosis not present

## 2014-12-15 ENCOUNTER — Ambulatory Visit
Admission: RE | Admit: 2014-12-15 | Discharge: 2014-12-15 | Disposition: A | Discharge status: Home / Self Care | Payer: Medicare Other | Source: Ambulatory Visit | Attending: Radiation Oncology | Admitting: Radiation Oncology

## 2014-12-15 ENCOUNTER — Telehealth: Payer: Self-pay | Admitting: *Deleted

## 2014-12-15 DIAGNOSIS — Z510 Encounter for antineoplastic radiation therapy: Secondary | ICD-10-CM | POA: Diagnosis not present

## 2014-12-15 NOTE — Telephone Encounter (Signed)
Spoke to pt concerning needs during xrt. Relate she is doing well and without complaints. Encourage pt to call with questions or needs. Received verbal understanding.

## 2014-12-16 ENCOUNTER — Ambulatory Visit
Admission: RE | Admit: 2014-12-16 | Discharge: 2014-12-16 | Disposition: A | Discharge status: Home / Self Care | Payer: Medicare Other | Source: Ambulatory Visit | Attending: Radiation Oncology | Admitting: Radiation Oncology

## 2014-12-16 ENCOUNTER — Encounter: Payer: Self-pay | Admitting: Radiation Oncology

## 2014-12-16 VITALS — BP 150/89 | HR 87 | Resp 16 | Wt 137.6 lb

## 2014-12-16 DIAGNOSIS — C50511 Malignant neoplasm of lower-outer quadrant of right female breast: Secondary | ICD-10-CM | POA: Diagnosis not present

## 2014-12-16 DIAGNOSIS — Z510 Encounter for antineoplastic radiation therapy: Secondary | ICD-10-CM | POA: Diagnosis not present

## 2014-12-16 MED ORDER — RADIAPLEXRX EX GEL
Freq: Once | CUTANEOUS | Status: AC
  Administered 2014-12-16: 16:00:00 via TOPICAL

## 2014-12-16 MED ORDER — ALRA NON-METALLIC DEODORANT (RAD-ONC)
1.0000 "application " | Freq: Once | TOPICAL | Status: AC
  Administered 2014-12-16: 1 via TOPICAL

## 2014-12-16 NOTE — Progress Notes (Addendum)
Weight and vitals stable. Denies pain. No skin changes noted within treatment field. Provided with radiaplex and alra then, directed upon use. Patient verbalized understanding. Patient denies fatigue.  BP 150/89 mmHg  Pulse 87  Resp 16  Wt 137 lb 9.6 oz (62.415 kg) Wt Readings from Last 3 Encounters:  12/16/14 137 lb 9.6 oz (62.415 kg)  11/28/14 138 lb 12.8 oz (62.959 kg)  11/24/14 137 lb 12.8 oz (62.506 kg)   Oriented patient to staff and routine of the clinic. Provided patient with RADIATION THERAPY AND YOU handbook then, reviewed pertinent information. Educated patient reference potential side effects and management such as, fatigue and skin changes. Provided patient with radiaplex and alra then, directed upon use. Patient verbalized understanding of all reviewed.

## 2014-12-16 NOTE — Progress Notes (Signed)
Weekly Management Note:  Site: Right breast Current Dose:  1000  cGy Projected Dose: 5000  cGy  Narrative: The patient is seen today for routine under treatment assessment. CBCT/MVCT images/port films were reviewed. The chart was reviewed.   She is without complaints today.  She had patient education today.  Physical Examination:  Filed Vitals:   12/16/14 1605  BP: 150/89  Pulse: 87  Resp: 16  .  Weight: 137 lb 9.6 oz (62.415 kg).  There are no significant skin changes along the right breast.  Impression: Tolerating radiation therapy well.  Plan: Continue radiation therapy as planned.

## 2014-12-17 ENCOUNTER — Ambulatory Visit
Admission: RE | Admit: 2014-12-17 | Discharge: 2014-12-17 | Disposition: A | Discharge status: Home / Self Care | Payer: Medicare Other | Source: Ambulatory Visit | Attending: Radiation Oncology | Admitting: Radiation Oncology

## 2014-12-17 ENCOUNTER — Ambulatory Visit: Payer: Medicare Other | Admitting: Radiation Oncology

## 2014-12-17 DIAGNOSIS — Z510 Encounter for antineoplastic radiation therapy: Secondary | ICD-10-CM | POA: Diagnosis not present

## 2014-12-18 ENCOUNTER — Ambulatory Visit
Admission: RE | Admit: 2014-12-18 | Discharge: 2014-12-18 | Disposition: A | Discharge status: Home / Self Care | Payer: Medicare Other | Source: Ambulatory Visit | Attending: Radiation Oncology | Admitting: Radiation Oncology

## 2014-12-18 DIAGNOSIS — Z510 Encounter for antineoplastic radiation therapy: Secondary | ICD-10-CM | POA: Diagnosis not present

## 2014-12-19 ENCOUNTER — Ambulatory Visit
Admission: RE | Admit: 2014-12-19 | Discharge: 2014-12-19 | Disposition: A | Discharge status: Home / Self Care | Payer: Medicare Other | Source: Ambulatory Visit | Attending: Radiation Oncology | Admitting: Radiation Oncology

## 2014-12-19 DIAGNOSIS — Z510 Encounter for antineoplastic radiation therapy: Secondary | ICD-10-CM | POA: Diagnosis not present

## 2014-12-22 ENCOUNTER — Encounter: Payer: Self-pay | Admitting: Radiation Oncology

## 2014-12-22 ENCOUNTER — Ambulatory Visit
Admission: RE | Admit: 2014-12-22 | Discharge: 2014-12-22 | Disposition: A | Discharge status: Home / Self Care | Payer: Medicare Other | Source: Ambulatory Visit | Attending: Radiation Oncology | Admitting: Radiation Oncology

## 2014-12-22 VITALS — BP 135/79 | HR 78 | Temp 98.1°F | Resp 16 | Wt 136.8 lb

## 2014-12-22 DIAGNOSIS — C50511 Malignant neoplasm of lower-outer quadrant of right female breast: Secondary | ICD-10-CM

## 2014-12-22 DIAGNOSIS — Z510 Encounter for antineoplastic radiation therapy: Secondary | ICD-10-CM | POA: Diagnosis not present

## 2014-12-22 NOTE — Progress Notes (Signed)
Weekly rad txs right breast 9/25 completed so far, no skin changes noted, skin intact, using radiaplex bid, no c/o pain, occasional shooting pains  In breast  Has been that since surgery stated patient, good appetite, no fatigue 9:18 AM BP 135/79 mmHg  Pulse 78  Temp(Src) 98.1 F (36.7 C)  Resp 16  Wt 136 lb 12.8 oz (62.052 kg)  Wt Readings from Last 3 Encounters:  12/22/14 136 lb 12.8 oz (62.052 kg)  12/16/14 137 lb 9.6 oz (62.415 kg)  11/28/14 138 lb 12.8 oz (62.959 kg)

## 2014-12-22 NOTE — Progress Notes (Signed)
   Weekly Management Note:  outpatient    ICD-9-CM ICD-10-CM   1. Breast cancer of lower-outer quadrant of right female breast 174.5 C50.511     Current Dose:  18 Gy  Projected Dose: 60 Gy   Narrative:  The patient presents for routine under treatment assessment.  CBCT/MVCT images/Port film x-rays were reviewed.  The chart was checked. No complaints  Physical Findings:  weight is 136 lb 12.8 oz (62.052 kg). Her temperature is 98.1 F (36.7 C). Her blood pressure is 135/79 and her pulse is 78. Her respiration is 16.   Wt Readings from Last 3 Encounters:  12/22/14 136 lb 12.8 oz (62.052 kg)  12/16/14 137 lb 9.6 oz (62.415 kg)  11/28/14 138 lb 12.8 oz (62.959 kg)   No skin changes over right breast  Impression:  The patient is tolerating radiotherapy.  Plan:  Continue radiotherapy as planned.    ________________________________   Eppie Gibson, M.D.

## 2014-12-23 ENCOUNTER — Ambulatory Visit
Admission: RE | Admit: 2014-12-23 | Discharge: 2014-12-23 | Disposition: A | Discharge status: Home / Self Care | Payer: Medicare Other | Source: Ambulatory Visit | Attending: Radiation Oncology | Admitting: Radiation Oncology

## 2014-12-23 ENCOUNTER — Other Ambulatory Visit: Payer: Self-pay | Admitting: Family Medicine

## 2014-12-23 DIAGNOSIS — Z510 Encounter for antineoplastic radiation therapy: Secondary | ICD-10-CM | POA: Diagnosis not present

## 2014-12-23 NOTE — Telephone Encounter (Signed)
Px written for call in   

## 2014-12-23 NOTE — Telephone Encounter (Signed)
Electronic refill request, pt has CPE scheduled on 04/28/15, last refilled on 07/08/14 #30 with 1 additional refill

## 2014-12-24 ENCOUNTER — Ambulatory Visit
Admission: RE | Admit: 2014-12-24 | Discharge: 2014-12-24 | Disposition: A | Discharge status: Home / Self Care | Payer: Medicare Other | Source: Ambulatory Visit | Attending: Radiation Oncology | Admitting: Radiation Oncology

## 2014-12-24 DIAGNOSIS — Z510 Encounter for antineoplastic radiation therapy: Secondary | ICD-10-CM | POA: Diagnosis not present

## 2014-12-24 NOTE — Telephone Encounter (Signed)
Rx called in as prescribed 

## 2014-12-25 ENCOUNTER — Ambulatory Visit
Admission: RE | Admit: 2014-12-25 | Discharge: 2014-12-25 | Disposition: A | Discharge status: Home / Self Care | Payer: Medicare Other | Source: Ambulatory Visit | Attending: Radiation Oncology | Admitting: Radiation Oncology

## 2014-12-25 DIAGNOSIS — Z510 Encounter for antineoplastic radiation therapy: Secondary | ICD-10-CM | POA: Diagnosis not present

## 2014-12-26 ENCOUNTER — Ambulatory Visit
Admission: RE | Admit: 2014-12-26 | Discharge: 2014-12-26 | Disposition: A | Discharge status: Home / Self Care | Payer: Medicare Other | Source: Ambulatory Visit | Attending: Radiation Oncology | Admitting: Radiation Oncology

## 2014-12-26 DIAGNOSIS — Z510 Encounter for antineoplastic radiation therapy: Secondary | ICD-10-CM | POA: Diagnosis not present

## 2014-12-29 ENCOUNTER — Encounter: Payer: Self-pay | Admitting: Radiation Oncology

## 2014-12-29 ENCOUNTER — Ambulatory Visit
Admission: RE | Admit: 2014-12-29 | Discharge: 2014-12-29 | Disposition: A | Discharge status: Home / Self Care | Payer: Medicare Other | Source: Ambulatory Visit | Attending: Radiation Oncology | Admitting: Radiation Oncology

## 2014-12-29 VITALS — BP 110/75 | HR 80 | Temp 97.9°F | Resp 12 | Wt 135.9 lb

## 2014-12-29 DIAGNOSIS — C50511 Malignant neoplasm of lower-outer quadrant of right female breast: Secondary | ICD-10-CM

## 2014-12-29 DIAGNOSIS — Z510 Encounter for antineoplastic radiation therapy: Secondary | ICD-10-CM | POA: Diagnosis not present

## 2014-12-29 NOTE — Progress Notes (Signed)
PAIN: She is currently in no pain. SKIN: Pt right breast- positive for slight erythema.  Pt denies edema.  Pt continues to apply Radiaplex as directed. OTHER: Pt complains of loss of sleep. BP 110/75 mmHg  Pulse 80  Temp(Src) 97.9 F (36.6 C) (Oral)  Resp 12  Wt 135 lb 14.4 oz (61.644 kg)  SpO2 100% Wt Readings from Last 3 Encounters:  12/29/14 135 lb 14.4 oz (61.644 kg)  12/22/14 136 lb 12.8 oz (62.052 kg)  12/16/14 137 lb 9.6 oz (62.415 kg)

## 2014-12-29 NOTE — Progress Notes (Signed)
   Weekly Management Note:  outpatient    ICD-9-CM ICD-10-CM   1. Breast cancer of lower-outer quadrant of right female breast 174.5 C50.511     Current Dose:  28 Gy  Projected Dose: 60 Gy   Narrative:  The patient presents for routine under treatment assessment.  CBCT/MVCT images/Port film x-rays were reviewed.  The chart was checked. No pain  Physical Findings:  weight is 135 lb 14.4 oz (61.644 kg). Her oral temperature is 97.9 F (36.6 C). Her blood pressure is 110/75 and her pulse is 80. Her respiration is 12 and oxygen saturation is 100%.   Wt Readings from Last 3 Encounters:  12/29/14 135 lb 14.4 oz (61.644 kg)  12/22/14 136 lb 12.8 oz (62.052 kg)  12/16/14 137 lb 9.6 oz (62.415 kg)   Slight erythema over right breast  Impression:  The patient is tolerating radiotherapy.  Plan:  Continue radiotherapy as planned.    ________________________________   Eppie Gibson, M.D.

## 2014-12-30 ENCOUNTER — Ambulatory Visit
Admission: RE | Admit: 2014-12-30 | Discharge: 2014-12-30 | Disposition: A | Discharge status: Home / Self Care | Payer: Medicare Other | Source: Ambulatory Visit | Attending: Radiation Oncology | Admitting: Radiation Oncology

## 2014-12-30 DIAGNOSIS — Z510 Encounter for antineoplastic radiation therapy: Secondary | ICD-10-CM | POA: Diagnosis not present

## 2014-12-31 ENCOUNTER — Ambulatory Visit
Admission: RE | Admit: 2014-12-31 | Discharge: 2014-12-31 | Disposition: A | Discharge status: Home / Self Care | Payer: Medicare Other | Source: Ambulatory Visit | Attending: Radiation Oncology | Admitting: Radiation Oncology

## 2014-12-31 ENCOUNTER — Encounter: Payer: Self-pay | Admitting: Radiation Oncology

## 2014-12-31 DIAGNOSIS — Z510 Encounter for antineoplastic radiation therapy: Secondary | ICD-10-CM | POA: Diagnosis not present

## 2015-01-01 ENCOUNTER — Ambulatory Visit
Admission: RE | Admit: 2015-01-01 | Discharge: 2015-01-01 | Disposition: A | Discharge status: Home / Self Care | Payer: Medicare Other | Source: Ambulatory Visit | Attending: Radiation Oncology | Admitting: Radiation Oncology

## 2015-01-01 DIAGNOSIS — Z510 Encounter for antineoplastic radiation therapy: Secondary | ICD-10-CM | POA: Diagnosis not present

## 2015-01-02 ENCOUNTER — Ambulatory Visit
Admission: RE | Admit: 2015-01-02 | Discharge: 2015-01-02 | Disposition: A | Discharge status: Home / Self Care | Payer: Medicare Other | Source: Ambulatory Visit | Attending: Radiation Oncology | Admitting: Radiation Oncology

## 2015-01-02 DIAGNOSIS — Z510 Encounter for antineoplastic radiation therapy: Secondary | ICD-10-CM | POA: Diagnosis not present

## 2015-01-05 ENCOUNTER — Encounter: Payer: Self-pay | Admitting: Radiation Oncology

## 2015-01-05 ENCOUNTER — Ambulatory Visit
Admission: RE | Admit: 2015-01-05 | Discharge: 2015-01-05 | Disposition: A | Discharge status: Home / Self Care | Payer: Medicare Other | Source: Ambulatory Visit | Attending: Radiation Oncology | Admitting: Radiation Oncology

## 2015-01-05 VITALS — BP 114/68 | HR 75 | Temp 97.7°F | Ht 61.0 in | Wt 136.0 lb

## 2015-01-05 DIAGNOSIS — C50511 Malignant neoplasm of lower-outer quadrant of right female breast: Secondary | ICD-10-CM | POA: Insufficient documentation

## 2015-01-05 DIAGNOSIS — Z510 Encounter for antineoplastic radiation therapy: Secondary | ICD-10-CM | POA: Diagnosis not present

## 2015-01-05 MED ORDER — RADIAPLEXRX EX GEL
Freq: Once | CUTANEOUS | Status: AC
Start: 2015-01-05 — End: 2015-01-05
  Administered 2015-01-05: 10:00:00 via TOPICAL

## 2015-01-05 NOTE — Progress Notes (Signed)
   Weekly Management Note:  outpatient    ICD-9-CM ICD-10-CM   1. Breast cancer of lower-outer quadrant of right female breast 174.5 C50.511 hyaluronate sodium (RADIAPLEXRX) gel    Current Dose:  38 Gy  Projected Dose: 60 Gy   Narrative:  The patient presents for routine under treatment assessment.  CBCT/MVCT images/Port film x-rays were reviewed.  The chart was checked. No new complaints  Physical Findings:  height is 5\' 1"  (1.549 m) and weight is 136 lb (61.689 kg). Her temperature is 97.7 F (36.5 C). Her blood pressure is 114/68 and her pulse is 75.   Wt Readings from Last 3 Encounters:  01/05/15 136 lb (61.689 kg)  12/29/14 135 lb 14.4 oz (61.644 kg)  12/22/14 136 lb 12.8 oz (62.052 kg)   Erythema over right breast - skin intact  Impression:  The patient is tolerating radiotherapy.  Plan:  Continue radiotherapy as planned.    ________________________________   Eppie Gibson, M.D.

## 2015-01-05 NOTE — Progress Notes (Signed)
Jamie Dillon has received 19 fractions to her right breast.  She denies any pain today.  Note erythema in the lateral inframmary fold region.  Skin intact.

## 2015-01-06 ENCOUNTER — Ambulatory Visit
Admission: RE | Admit: 2015-01-06 | Discharge: 2015-01-06 | Disposition: A | Discharge status: Home / Self Care | Payer: Medicare Other | Source: Ambulatory Visit | Attending: Radiation Oncology | Admitting: Radiation Oncology

## 2015-01-06 DIAGNOSIS — Z510 Encounter for antineoplastic radiation therapy: Secondary | ICD-10-CM | POA: Diagnosis not present

## 2015-01-07 ENCOUNTER — Ambulatory Visit
Admission: RE | Admit: 2015-01-07 | Discharge: 2015-01-07 | Disposition: A | Discharge status: Home / Self Care | Payer: Medicare Other | Source: Ambulatory Visit | Attending: Radiation Oncology | Admitting: Radiation Oncology

## 2015-01-07 DIAGNOSIS — Z510 Encounter for antineoplastic radiation therapy: Secondary | ICD-10-CM | POA: Diagnosis not present

## 2015-01-08 ENCOUNTER — Ambulatory Visit
Admission: RE | Admit: 2015-01-08 | Discharge: 2015-01-08 | Disposition: A | Discharge status: Home / Self Care | Payer: Medicare Other | Source: Ambulatory Visit | Attending: Radiation Oncology | Admitting: Radiation Oncology

## 2015-01-08 DIAGNOSIS — Z510 Encounter for antineoplastic radiation therapy: Secondary | ICD-10-CM | POA: Diagnosis not present

## 2015-01-09 ENCOUNTER — Ambulatory Visit
Admission: RE | Admit: 2015-01-09 | Discharge: 2015-01-09 | Disposition: A | Discharge status: Home / Self Care | Payer: Medicare Other | Source: Ambulatory Visit | Attending: Radiation Oncology | Admitting: Radiation Oncology

## 2015-01-09 DIAGNOSIS — Z510 Encounter for antineoplastic radiation therapy: Secondary | ICD-10-CM | POA: Diagnosis not present

## 2015-01-12 ENCOUNTER — Encounter: Payer: Self-pay | Admitting: Radiation Oncology

## 2015-01-12 ENCOUNTER — Ambulatory Visit
Admission: RE | Admit: 2015-01-12 | Discharge: 2015-01-12 | Disposition: A | Discharge status: Home / Self Care | Payer: Medicare Other | Source: Ambulatory Visit | Attending: Radiation Oncology | Admitting: Radiation Oncology

## 2015-01-12 ENCOUNTER — Other Ambulatory Visit (HOSPITAL_BASED_OUTPATIENT_CLINIC_OR_DEPARTMENT_OTHER): Payer: Medicare Other

## 2015-01-12 ENCOUNTER — Ambulatory Visit (HOSPITAL_BASED_OUTPATIENT_CLINIC_OR_DEPARTMENT_OTHER): Payer: Medicare Other | Admitting: Oncology

## 2015-01-12 ENCOUNTER — Telehealth: Payer: Self-pay | Admitting: Oncology

## 2015-01-12 VITALS — BP 120/78 | HR 77 | Resp 16 | Wt 133.7 lb

## 2015-01-12 VITALS — BP 156/76 | HR 70 | Temp 97.6°F | Resp 18 | Ht 61.0 in | Wt 134.8 lb

## 2015-01-12 DIAGNOSIS — Z510 Encounter for antineoplastic radiation therapy: Secondary | ICD-10-CM | POA: Diagnosis not present

## 2015-01-12 DIAGNOSIS — M8580 Other specified disorders of bone density and structure, unspecified site: Secondary | ICD-10-CM

## 2015-01-12 DIAGNOSIS — C50511 Malignant neoplasm of lower-outer quadrant of right female breast: Secondary | ICD-10-CM

## 2015-01-12 DIAGNOSIS — Z170 Estrogen receptor positive status [ER+]: Secondary | ICD-10-CM

## 2015-01-12 LAB — CBC WITH DIFFERENTIAL/PLATELET
BASO%: 1.1 % (ref 0.0–2.0)
Basophils Absolute: 0.1 10*3/uL (ref 0.0–0.1)
EOS ABS: 0.2 10*3/uL (ref 0.0–0.5)
EOS%: 3.3 % (ref 0.0–7.0)
HCT: 38.3 % (ref 34.8–46.6)
HGB: 13.1 g/dL (ref 11.6–15.9)
LYMPH%: 21 % (ref 14.0–49.7)
MCH: 33.5 pg (ref 25.1–34.0)
MCHC: 34.2 g/dL (ref 31.5–36.0)
MCV: 98 fL (ref 79.5–101.0)
MONO#: 0.6 10*3/uL (ref 0.1–0.9)
MONO%: 14.1 % — ABNORMAL HIGH (ref 0.0–14.0)
NEUT%: 60.5 % (ref 38.4–76.8)
NEUTROS ABS: 2.7 10*3/uL (ref 1.5–6.5)
Platelets: 196 10*3/uL (ref 145–400)
RBC: 3.91 10*6/uL (ref 3.70–5.45)
RDW: 12.8 % (ref 11.2–14.5)
WBC: 4.5 10*3/uL (ref 3.9–10.3)
lymph#: 0.9 10*3/uL (ref 0.9–3.3)

## 2015-01-12 LAB — COMPREHENSIVE METABOLIC PANEL (CC13)
ALT: 21 U/L (ref 0–55)
AST: 21 U/L (ref 5–34)
Albumin: 4 g/dL (ref 3.5–5.0)
Alkaline Phosphatase: 74 U/L (ref 40–150)
Anion Gap: 7 mEq/L (ref 3–11)
BUN: 15.7 mg/dL (ref 7.0–26.0)
CHLORIDE: 108 meq/L (ref 98–109)
CO2: 28 mEq/L (ref 22–29)
Calcium: 10.1 mg/dL (ref 8.4–10.4)
Creatinine: 0.8 mg/dL (ref 0.6–1.1)
EGFR: 79 mL/min/{1.73_m2} — ABNORMAL LOW (ref >90)
GLUCOSE: 91 mg/dL (ref 70–140)
Potassium: 4 mEq/L (ref 3.5–5.1)
SODIUM: 143 meq/L (ref 136–145)
Total Bilirubin: 0.45 mg/dL (ref 0.20–1.20)
Total Protein: 6.8 g/dL (ref 6.4–8.3)

## 2015-01-12 MED ORDER — TAMOXIFEN CITRATE 20 MG PO TABS: 20.0000 mg | ORAL_TABLET | Freq: Every day | ORAL | Status: DC

## 2015-01-12 NOTE — Progress Notes (Signed)
   Weekly Management Note:  outpatient    ICD-9-CM ICD-10-CM   1. Breast cancer of lower-outer quadrant of right female breast 174.5 C50.511     Current Dose:  48 Gy  Projected Dose: 60 Gy   Narrative:  The patient presents for routine under treatment assessment.  CBCT/MVCT images/Port film x-rays were reviewed.  The chart was checked. No new complaints  Physical Findings:  weight is 133 lb 11.2 oz (60.646 kg). Her blood pressure is 120/78 and her pulse is 77. Her respiration is 16.   Wt Readings from Last 3 Encounters:  01/12/15 134 lb 12.8 oz (61.145 kg)  01/12/15 133 lb 11.2 oz (60.646 kg)  01/05/15 136 lb (61.689 kg)   Mild dermatitis in right breast UIQ; skin intact  Impression:  The patient is tolerating radiotherapy.  Plan:  Continue radiotherapy as planned.   ________________________________   Eppie Gibson, M.D.

## 2015-01-12 NOTE — Progress Notes (Signed)
Weight and vitals stable. Denies pain. Reports she completes Amoxillicin for ear infection today. Right chest dermatitis less today than last week with the aid of hydrocortisone. Reports hyperpigmentation without desquamation within treatment field. Reports using radiaplex and alra as directed. Reports mild fatigue.  BP 120/78 mmHg  Pulse 77  Resp 16  Wt 133 lb 11.2 oz (60.646 kg) Wt Readings from Last 3 Encounters:  01/12/15 133 lb 11.2 oz (60.646 kg)  01/05/15 136 lb (61.689 kg)  12/29/14 135 lb 14.4 oz (61.644 kg)

## 2015-01-12 NOTE — Progress Notes (Signed)
Jamie Dillon  [phone]) [phone] [phone]) [phone]     ID: Jamie Dillon DOB: 1950-09-04  MR#: [id number]  NWG#:[id number]  Patient Care Team: Abner Greenspan, MD as PCP - General Rolm Bookbinder, MD as Consulting Physician (General Surgery) Chauncey Cruel, MD as Consulting Physician (Oncology) Eppie Gibson, MD as Attending Physician (Radiation Oncology) Rockwell Germany, RN as Registered Nurse Mauro Kaufmann, RN as Registered Nurse Holley Bouche, NP as Nurse Practitioner (Nurse Practitioner) OTHER MD:  CHIEF COMPLAINT:  Estrogen receptor positive breast cancer  CURRENT TREATMENT: adjuvant radiation   BREAST CANCER HISTORY: From the original intake note:  Jamie Dillon had routine screening mammography at [facility] 06/05/2014. The breast density was category B. There was a 6 mm irregular mass in the right breast. On 06/11/2014 the patient underwent right breast ultrasonography at [facility]. This confirmed a 7 mm all her than wide irregular mass in the right blast at the 8:00 position. 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  The patient's subsequent history is as detailed below  INTERVAL HISTORY: Jamie Dillon returns today for follow-up of her breast cancer, accompanied by her husband Jamie Dillon. She completed her chemotherapy treatments early July, and started her adjuvant radiation 12/16/2014. So far she is tolerating it well. She has very minimal fatigue. There have been no skin changes. Overall she feels "really good".  REVIEW OF SYSTEMS: Jamie Dillon and Jamie Dillon have not been able to due to much this summer because of all her treatments, but they're planning several "short trips" to the [facility] sent to  the beach once the weather changes. A detailed review of systems today was otherwise entirely negative.  PAST MEDICAL HISTORY: Past Medical History  Diagnosis Date  . Allergic rhinitis   . HTN (hypertension)   . HLD (hyperlipidemia)   . Tobacco abuse   . Family history of malignant neoplasm of breast   . Family history of other kidney diseases   . Disorder of bone and cartilage, unspecified   . Breast cancer of lower-outer quadrant of right female breast 06/19/2014  . Anxiety   . PONV (postoperative nausea and vomiting)     PAST SURGICAL HISTORY: Past Surgical History  Procedure Laterality Date  . Total vaginal hysterectomy  1996    endometriosis  . Abdominal hysterectomy    . Appendectomy    . Colonoscopy    . Dilation and curettage of uterus    . Breast lumpectomy with needle localization and axillary sentinel lymph node bx Right 07/21/2014    Procedure: BREAST LUMPECTOMY WITH NEEDLE LOCALIZATION AND AXILLARY SENTINEL LYMPH NODE BIOPSY;  Surgeon: Rolm Bookbinder, MD;  Location: [facility];  Service: General;  Laterality: Right;  . Portacath placement Right 09/10/2014    Procedure: INSERTION PORT-A-CATH;  Surgeon: Rolm Bookbinder, MD;  Location: [facility];  Service: General;  Laterality: Right;    FAMILY HISTORY Family History  Problem Relation Age of Onset  . Breast cancer Sister 76  . Hypertension Mother   . Kidney disease Father   . Diabetes Paternal Aunt    the patient's father died from kidney failure at the age of 81. The patient's mother is still living at age 33. The patient had no brothers, one sister. That sister was diagnosed  with breast cancer at age 54. The patient's father's mother was diagnosed with mouth cancer at the age of 76.  GYNECOLOGIC HISTORY:  No LMP recorded. Patient has had a hysterectomy. Menarche age 41, first live birth age 70. The patient is GX P2. She had a total abdominal hysterectomy with bilateral salpingo-oophorectomy in 1996.  She took hormone replacement for approximately 10 years, until 2009. She also took oral contraceptives for approximately 7 years remotely, with no complications  SOCIAL HISTORY:  The patient is currently retired, though she still works part-time at the [employer] Her husband "Jamie Dillon" Jamie Dillon is retired from Press photographer. Son Jamie Dillon "Jamie Dillon" Jamie Dillon teaches English at [employer] in [place]. Daughter Jamie Dillon lives in [place] . She works as a Marine scientist at the [employer]    Jamie Dillon: In [place]: Social History  Substance Use Topics  . Smoking status: Former Smoker -- 1.00 packs/day    Types: Cigarettes    Quit date: 06/25/2010  . Smokeless tobacco: Not on file  . Alcohol Use: 0.0 oz/week    0 Standard drinks or equivalent per week     Comment: occasional wine     Colonoscopy: 2010  PAP:  Bone density: December 2015  Lipid panel:  Allergies  Allergen Reactions  . Dust Mite Extract   . Pollen Extract     Current Outpatient Prescriptions  Medication Sig Dispense Refill  . ALPRAZolam (XANAX) 0.5 MG tablet TAKE 1 TABLET BY MOUTH EVERY DAY AS NEEDED 30 tablet 1  . amoxicillin (AMOXIL) 875 MG tablet     . atorvastatin (LIPITOR) 10 MG tablet Take 1 tablet (10 mg total) by mouth daily. 30 tablet 11  . b complex vitamins tablet Take 1 tablet by mouth daily.     . Calcium Carbonate-Vitamin D (CALCIUM-VITAMIN D) 500-200 MG-UNIT per tablet Take 3 tablets by mouth every morning.      . cetirizine (ZYRTEC) 10 MG tablet Take 10 mg by mouth daily as needed for allergies.     . diphenhydrAMINE (BENADRYL) 25 MG tablet Take 25 mg by mouth daily as needed for allergies.     . fluticasone (FLONASE) 50 MCG/ACT nasal spray Place 2 sprays into both nostrils daily. 16 g 6  . metoprolol succinate (TOPROL-XL) 50 MG 24 hr tablet Take 1 tablet (50 mg total) by mouth daily. Take with or immediately following a meal. 30 tablet 11  .  Multiple Vitamin (MULTIVITAMIN) tablet Take 1 tablet by mouth daily.      . non-metallic deodorant Jamie Dillon) MISC Apply 1 application topically daily as needed.    Marland Kitchen oxyCODONE-acetaminophen (PERCOCET) 10-325 MG per tablet Take 1 tablet by mouth every 6 (six) hours as needed for pain. 10 tablet 0  . tobramycin-dexamethasone (TOBRADEX) ophthalmic solution Place 1 drop into both eyes 2 (two) times daily. 5 mL 0  . Wound Cleansers (RADIAPLEX EX) Apply topically.     No current facility-administered medications for this visit.    OBJECTIVE: Middle-aged white woman who appears stated age 64 Vitals:   01/12/15 1014  BP: 156/76  Pulse: 70  Temp: 97.6 F (36.4 C)  Resp: 18     Body mass index is 25.48 kg/(m^2).    ECOG FS:0 - Asymptomatic  Sclerae unicteric, pupils round and equal Oropharynx clear and moist-- no thrush or other lesions No cervical or supraclavicular adenopathy Lungs no rales or rhonchi Heart regular rate and rhythm Abd soft, nontender, positive bowel sounds  MSK no focal spinal tenderness, no upper extremity lymphedema Neuro: nonfocal, well oriented, appropriate affect Breasts: The right breast is status post lumpectomy and is currently undergoing radiation. The cosmetic result is excellent. There is minimal erythema over the irradiated breast. There is no evidence of local recurrence. The right axilla is benign. The left breast is unremarkable.  LAB RESULTS:  CMP     Component Value Date/Time   NA 139 11/24/2014 1251   NA 135 09/24/2014 0925   K 3.6 11/24/2014 1251   K 3.5 09/24/2014 0925   CL 103 09/24/2014 0925   CO2 25 11/24/2014 1251   CO2 23 09/24/2014 0925   GLUCOSE 125 11/24/2014 1251   GLUCOSE 127* 09/24/2014 0925   BUN 11.0 11/24/2014 1251   BUN 5* 09/24/2014 0925   CREATININE 0.8 11/24/2014 1251   CREATININE 0.79 09/24/2014 0925   CALCIUM 9.1 11/24/2014 1251   CALCIUM 7.9* 09/24/2014 0925   PROT 5.8* 11/24/2014 1251   PROT 6.6 09/21/2014 1706    ALBUMIN 3.2* 11/24/2014 1251   ALBUMIN 3.2* 09/21/2014 1706   AST 15 11/24/2014 1251   AST 30 09/21/2014 1706   ALT 15 11/24/2014 1251   ALT 16 09/21/2014 1706   ALKPHOS 84 11/24/2014 1251   ALKPHOS 71 09/21/2014 1706   BILITOT 0.68 11/24/2014 1251   BILITOT 1.9* 09/21/2014 1706   GFRNONAA >60 09/24/2014 0925   GFRAA >60 09/24/2014 0925    INo results found for: SPEP, UPEP  Lab Results  Component Value Date   WBC 4.5 01/12/2015   NEUTROABS 2.7 01/12/2015   HGB 13.1 01/12/2015   HCT 38.3 01/12/2015   MCV 98.0 01/12/2015   PLT 196 01/12/2015      Chemistry      Component Value Date/Time   NA 139 11/24/2014 1251   NA 135 09/24/2014 0925   K 3.6 11/24/2014 1251   K 3.5 09/24/2014 0925   CL 103 09/24/2014 0925   CO2 25 11/24/2014 1251   CO2 23 09/24/2014 0925   BUN 11.0 11/24/2014 1251   BUN 5* 09/24/2014 0925   CREATININE 0.8 11/24/2014 1251   CREATININE 0.79 09/24/2014 0925      Component Value Date/Time   CALCIUM 9.1 11/24/2014 1251   CALCIUM 7.9* 09/24/2014 0925   ALKPHOS 84 11/24/2014 1251   ALKPHOS 71 09/21/2014 1706   AST 15 11/24/2014 1251   AST 30 09/21/2014 1706   ALT 15 11/24/2014 1251   ALT 16 09/21/2014 1706   BILITOT 0.68 11/24/2014 1251   BILITOT 1.9* 09/21/2014 1706       No results found for: LABCA2  No components found for: LABCA125  No results for input(s): INR in the last 168 hours.  Urinalysis    Component Value Date/Time   COLORURINE YELLOW 09/21/2014 1809   APPEARANCEUR CLOUDY* 09/21/2014 1809   LABSPEC 1.009 09/21/2014 1809   PHURINE 7.0 09/21/2014 1809   GLUCOSEU NEGATIVE 09/21/2014 1809   HGBUR MODERATE* 09/21/2014 1809   HGBUR negative 02/13/2008 0911   BILIRUBINUR Neg. 10/15/2014 1248   BILIRUBINUR NEGATIVE 09/21/2014 1809   KETONESUR NEGATIVE 09/21/2014 1809   PROTEINUR Neg. 10/15/2014 1248   PROTEINUR 30* 09/21/2014 1809   UROBILINOGEN 0.2 10/15/2014 1248   UROBILINOGEN 0.2 09/21/2014 1809   NITRITE Neg.  10/15/2014 1248   NITRITE POSITIVE* 09/21/2014 1809   LEUKOCYTESUR Negative 10/15/2014 1248    STUDIES: No results found.   ASSESSMENT: 64 y.o. [place] woman status post right breast biopsy 06/17/2014 for a  clinical T1b N0, stage I invasive ductal carcinoma, grade 2, estrogen receptor strongly positive, progesterone receptor moderately positive, with no HER-2 amplification and an MIB-1 of 22%.  (1) status post right lumpectomy and right axillary sentinel lymph node sampling 07/21/2014 for a pT1b pN1a, stage IIA invasive ductal carcinoma, grade 1, with negative margins, repeat HER-2/neu again negative.  (2) adjuvant chemotherapy consisting of cyclophosphamide and docetaxel started 09/15/2014, given every 3 weeks with Neulasta support, completed 11/18/2014  (a) Neutropenic fever and hospitalization 5 days after the first cycle: subsequent cycles 10% dose reduced  (3)adjuvant radiation therapy started 12/16/2014  (4) a to start tamoxifen 02/14/2015  (5) osteopenia: bone density January 2016 shows a T score of -1.1 at the femoral neck  PLAN: Zalea is doing very well with her radiation, which she will complete next week. Today we discussed antiestrogen's, and she understands this is the most effective treatment that she will receive for her breast cancer. In general antiestrogen's cut in half the risk of local recurrence, the risk of systemic recurrence, and the risk of developing a new breast cancer in either breast in the future.  We discussed the possible toxicities, side effects and complications of tamoxifen as compared to anastrozole. Because she status post hysterectomy, I think tamoxifen may be a better option for her in terms of quality of life. If she does wish to stay on tamoxifen she will probably want to continue for a total of 10 years to obtain the maximum effect from that medication.  I think it will take a little while for her to get over the admittedly mild side effects she  is experiencing from radiation, so we are not starting tamoxifen until early October. She is going to see me again in early December to assess tolerance.  Once Lanika is establishing an anti-estrogens we will see her every 3 months for the first 2 years, alternating with her surgeon.  She will be able to have her port removed anytime after mid-September  Chauncey Cruel, MD   01/12/2015 10:37 AM

## 2015-01-12 NOTE — Telephone Encounter (Signed)
Gave asv & calendar for December

## 2015-01-13 ENCOUNTER — Ambulatory Visit
Admission: RE | Admit: 2015-01-13 | Discharge: 2015-01-13 | Disposition: A | Discharge status: Home / Self Care | Payer: Medicare Other | Source: Ambulatory Visit | Attending: Radiation Oncology | Admitting: Radiation Oncology

## 2015-01-13 ENCOUNTER — Ambulatory Visit: Payer: Medicare Other

## 2015-01-13 DIAGNOSIS — Z510 Encounter for antineoplastic radiation therapy: Secondary | ICD-10-CM | POA: Diagnosis not present

## 2015-01-14 ENCOUNTER — Ambulatory Visit
Admission: RE | Admit: 2015-01-14 | Discharge: 2015-01-14 | Disposition: A | Discharge status: Home / Self Care | Payer: Medicare Other | Source: Ambulatory Visit | Attending: Radiation Oncology | Admitting: Radiation Oncology

## 2015-01-14 DIAGNOSIS — Z510 Encounter for antineoplastic radiation therapy: Secondary | ICD-10-CM | POA: Diagnosis not present

## 2015-01-15 ENCOUNTER — Ambulatory Visit
Admission: RE | Admit: 2015-01-15 | Discharge: 2015-01-15 | Disposition: A | Discharge status: Home / Self Care | Payer: Medicare Other | Source: Ambulatory Visit | Attending: Radiation Oncology | Admitting: Radiation Oncology

## 2015-01-15 DIAGNOSIS — Z510 Encounter for antineoplastic radiation therapy: Secondary | ICD-10-CM | POA: Diagnosis not present

## 2015-01-16 ENCOUNTER — Ambulatory Visit
Admission: RE | Admit: 2015-01-16 | Discharge: 2015-01-16 | Disposition: A | Discharge status: Home / Self Care | Payer: Medicare Other | Source: Ambulatory Visit | Attending: Radiation Oncology | Admitting: Radiation Oncology

## 2015-01-16 DIAGNOSIS — Z510 Encounter for antineoplastic radiation therapy: Secondary | ICD-10-CM | POA: Diagnosis not present

## 2015-01-20 ENCOUNTER — Ambulatory Visit: Payer: Medicare Other

## 2015-01-20 ENCOUNTER — Ambulatory Visit
Admission: RE | Admit: 2015-01-20 | Discharge: 2015-01-20 | Disposition: A | Discharge status: Home / Self Care | Payer: Medicare Other | Source: Ambulatory Visit | Attending: Radiation Oncology | Admitting: Radiation Oncology

## 2015-01-20 DIAGNOSIS — Z510 Encounter for antineoplastic radiation therapy: Secondary | ICD-10-CM | POA: Diagnosis not present

## 2015-01-21 ENCOUNTER — Encounter: Payer: Self-pay | Admitting: Radiation Oncology

## 2015-01-21 ENCOUNTER — Ambulatory Visit
Admission: RE | Admit: 2015-01-21 | Discharge: 2015-01-21 | Disposition: A | Discharge status: Home / Self Care | Payer: Medicare Other | Source: Ambulatory Visit | Attending: Radiation Oncology | Admitting: Radiation Oncology

## 2015-01-21 VITALS — BP 116/77 | HR 77 | Temp 98.0°F | Resp 12 | Wt 133.5 lb

## 2015-01-21 DIAGNOSIS — Z510 Encounter for antineoplastic radiation therapy: Secondary | ICD-10-CM | POA: Diagnosis not present

## 2015-01-21 DIAGNOSIS — C50511 Malignant neoplasm of lower-outer quadrant of right female breast: Secondary | ICD-10-CM | POA: Diagnosis not present

## 2015-01-21 MED ORDER — RADIAPLEXRX EX GEL
Freq: Once | CUTANEOUS | Status: AC
  Administered 2015-01-21: 11:00:00 via TOPICAL

## 2015-01-21 NOTE — Progress Notes (Signed)
   Weekly Management Note  Outpatient    ICD-9-CM ICD-10-CM   1. Breast cancer of lower-outer quadrant of right female breast 174.5 C50.511     Completed Radiotherapy. Total Dose:  60 Gy in 30 fractions  Narrative:  The patient presents for routine under treatment assessment on last day of radiotherapy.  CBCT/MVCT images/Port film x-rays were reviewed.  The chart was checked. No new complaints, doing well  Physical Findings:  weight is 133 lb 8 oz (60.555 kg). Her oral temperature is 98 F (36.7 C). Her blood pressure is 116/77 and her pulse is 77. Her respiration is 12 and oxygen saturation is 95%.   Erythematous right breast, low neck region, skin intact  Impression:  The patient has tolerated radiotherapy.  Plan:  Routine follow-up in one month.   ________________________________   Eppie Gibson, M.D.

## 2015-01-21 NOTE — Progress Notes (Signed)
PAIN: She is currently in no pain.  SKIN: Pt right breast- positive for Hyperpigmentation, Pruritus-slight, erythema and breast tenderness.  Pt denies edema.  Pt continues to apply Radiaplex as directed. OTHER: Pt complains of fatigue. BP 116/77 mmHg  Pulse 77  Temp(Src) 98 F (36.7 C) (Oral)  Resp 12  Wt 133 lb 8 oz (60.555 kg)  SpO2 95% Wt Readings from Last 3 Encounters:  01/21/15 133 lb 8 oz (60.555 kg)  01/12/15 134 lb 12.8 oz (61.145 kg)  01/12/15 133 lb 11.2 oz (60.646 kg)   Final treatment today!

## 2015-01-21 NOTE — Addendum Note (Signed)
Encounter addended by: Heywood Footman, RN on: 01/21/2015 10:58 AM<BR>     Documentation filed: Dx [employer], Inpatient MAR, Orders

## 2015-01-22 ENCOUNTER — Telehealth: Payer: Self-pay | Admitting: Oncology

## 2015-01-22 ENCOUNTER — Telehealth: Payer: Self-pay | Admitting: General Practice

## 2015-01-22 ENCOUNTER — Telehealth: Payer: Self-pay | Admitting: *Deleted

## 2015-01-22 DIAGNOSIS — C50511 Malignant neoplasm of lower-outer quadrant of right female breast: Secondary | ICD-10-CM

## 2015-01-22 NOTE — Telephone Encounter (Signed)
Appointments made  And patient called with survivorship

## 2015-01-22 NOTE — Telephone Encounter (Signed)
Called pt to congratulate on completion of xrt. Relate doing well and without complaints. Discussed survivorship referral and care plan. Received verbal understanding. Encourage pt to call with needs or questions.

## 2015-01-23 ENCOUNTER — Other Ambulatory Visit: Payer: Self-pay | Admitting: *Deleted

## 2015-01-23 MED ORDER — ATORVASTATIN CALCIUM 10 MG PO TABS: 10.0000 mg | ORAL_TABLET | Freq: Every day | ORAL | Status: DC

## 2015-01-23 NOTE — Telephone Encounter (Signed)
Received fax requesting Rx be changed to 90 day supply, done

## 2015-01-25 NOTE — Progress Notes (Signed)
  Radiation Oncology         [phone]) [phone] ________________________________  Name: Jamie Dillon MRN: [id number]  Date: 01/21/2015  DOB: [birth year]  End of Treatment Note  DIAGNOSIS:    ICD-9-CM ICD-10-CM   1. Breast cancer of lower-outer quadrant of right female breast 174.5 C50.511       STAGE II pT1b pN1 cM0 ER PR + HER2 (-) Right breast IDC  Indication for treatment:  Curative       Radiation treatment dates:   12/10/2014-01/21/2015  Site/dose:  1) Right breast/ axilla  // 50 Gy in 25 fractions 2) Right breast boost / 10 Gy in 5 fractions  Beams/energy:   1) 3D conformal / 6MV 2) Electron Monte Carlo / 12 MeV  Narrative: The patient tolerated radiation treatment relatively well.     Plan: The patient has completed radiation treatment. The patient will return to radiation oncology clinic for routine followup in one month. I advised them to call or return sooner if they have any questions or concerns related to their recovery or treatment.  -----------------------------------  Eppie Gibson, MD

## 2015-02-03 NOTE — Progress Notes (Signed)
Electron Holiday representative Note 12-31-14 Outpatient  Diagnosis: Breast Cancer   The patient's CT images from her initial simulation were reviewed to plan her boost treatment to her right breast  lumpectomy cavity.  Measurements were made regarding the size and depth of the surgical bed. The boost to the lumpectomy cavity will be delivered with 12 MeV electrons; 10 Gy in 5 fractions has been prescribed to the 100% isodose line.   An electron Monte Carlo plan was reviewed and approved.  A custom electron cut-out will be used for her boost field.    -----------------------------------  Eppie Gibson, MD

## 2015-02-10 ENCOUNTER — Encounter: Payer: Self-pay | Admitting: Nurse Practitioner

## 2015-02-10 ENCOUNTER — Ambulatory Visit (HOSPITAL_BASED_OUTPATIENT_CLINIC_OR_DEPARTMENT_OTHER): Payer: Medicare Other | Admitting: Nurse Practitioner

## 2015-02-10 VITALS — BP 131/70 | HR 69 | Temp 97.9°F | Resp 18 | Ht 61.0 in | Wt 135.0 lb

## 2015-02-10 DIAGNOSIS — C50511 Malignant neoplasm of lower-outer quadrant of right female breast: Secondary | ICD-10-CM

## 2015-02-10 MED ORDER — HEPARIN SOD (PORK) LOCK FLUSH 100 UNIT/ML IV SOLN
500.0000 [IU] | Freq: Once | INTRAVENOUS | Status: AC
  Administered 2015-02-10: 500 [IU] via INTRAVENOUS
  Filled 2015-02-10: qty 5

## 2015-02-10 MED ORDER — SODIUM CHLORIDE 0.9 % IJ SOLN
10.0000 mL | INTRAMUSCULAR | Status: DC | PRN
  Administered 2015-02-10: 10 mL via INTRAVENOUS
  Filled 2015-02-10: qty 10

## 2015-02-10 NOTE — Progress Notes (Signed)
CLINIC:  Cancer Survivorship   REASON FOR VISIT:  Routine follow-up post-treatment for a recent history of breast cancer.  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 HER2/neu repeated, negative (ratio 0.81)   07/21/2014 Pathologic Stage Stage IIA: pT1b pT1a pMx   09/15/2014 - 11/18/2014 Chemotherapy Adjuvant chemotherapy (Magrinat): cyclophosphamide and docetaxel q 3 weeks x 4 cycles; Neulasta support; neutropenic fever after C1 with 10% dose reductions made in both agents C2-C4.   12/10/2014 - 01/21/2015 Radiation Therapy Adjuvant RT completed Isidore Moos): Right breast 50 Gy over 25 fractions; right breast boost 10 Gy over 5 fractions. Total dose 60 Gy.    Anti-estrogen oral therapy Tamoxifen 20 mg daily to begin 02/14/2015. Planned duration of therapy: 10 years.    INTERVAL HISTORY:  Ms. Gebhardt presents to the [facility] today for our initial meeting to review her survivorship care plan detailing her treatment course for breast cancer, as well as monitoring long-term side effects of that treatment, education regarding health maintenance, screening, and overall wellness and health promotion.     Overall, Ms. Laverdure reports feeling quite well since completing her radiation therapy approximately 3 weeks ago. She continues with fatigue, but states that it is improving and states that the skin changes over her right breast are much  improved. She will have an occasional "shooting" pain along her right breast / chest wall, which have decreased in frequency.  These resolve without treatment. She reports a good appetite and denies any weight loss.  She denies any mass or nodularity about either breast, bone pain, or shortness of breath.  She has some headaches related to sinus drainage, but these are unchanged. She denies any fever or chills. She walks on the treadmill most days for 30 minutes.  She has not yet begun her Tamoxifen, but plans to start it February 14, 2015.  She has a follow up appointment with her radiation oncologist, Dr. Isidore Moos, in October 2016 and her medical oncologist, Dr. Jana Hakim, in December 2016.  She is awaiting scheduling of removal of her port-a-cath with Dr. Donne Hazel and will also see him for follow up in the Spring.  REVIEW OF SYSTEMS:  General: Headaches related to sinus / allergies as above. Denies night sweats. Cardiac: Occasional palpitation with anxiety symptoms, but this improves with deep breathing and medication.  Denies chest pain, and lower extremity edema.  Respiratory: Denies cough or dyspnea on exertion. GI: Denies abdominal pain, constipation, diarrhea, nausea, or vomiting.  GU: Occasional dysuria which clears when she increases her fluid intake.  Denies hematuria, vaginal bleeding, vaginal discharge, or vaginal dryness.  Musculoskeletal: Denies joint or bone pain.  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 Denies falls. Skin: Denies rash, pruritis, or open wounds.  Breast: Denies any new tenderness, nipple changes, or nipple discharge.  Psych: Occasional episodes of anxiety which predate cancer diagnosis and are manageable; improved by activity.  Also with some insomnia.  Denies depression.  A 14-point review of systems was completed and was negative, except as noted above.   ONCOLOGY TREATMENT TEAM:  1. Surgeon:  Dr. Donne Hazel at [facility]  2. Medical Oncologist: Dr. Jana Hakim 3. Radiation Oncologist: Dr. Isidore Moos    PAST MEDICAL/SURGICAL HISTORY:  Past Medical History  Diagnosis Date  . Allergic rhinitis   . HTN (hypertension)   . HLD (hyperlipidemia)   . Tobacco abuse   . Family history of malignant neoplasm of breast   . Family history of other kidney diseases   . Disorder of bone and cartilage, unspecified   . Breast cancer of lower-outer quadrant of right female breast 06/19/2014  . Anxiety   . PONV (postoperative nausea and vomiting)    Past Surgical History  Procedure Laterality Date  . Total vaginal hysterectomy  1996    endometriosis  . Abdominal hysterectomy    . Appendectomy    . Colonoscopy    . Dilation and curettage of uterus    . Breast lumpectomy with needle localization and axillary sentinel lymph node bx Right 07/21/2014    Procedure: BREAST LUMPECTOMY WITH NEEDLE LOCALIZATION AND AXILLARY SENTINEL LYMPH NODE BIOPSY;  Surgeon: Rolm Bookbinder, MD;  Location: [facility];  Service: General;  Laterality: Right;  . Portacath placement Right 09/10/2014    Procedure: INSERTION PORT-A-CATH;  Surgeon: Rolm Bookbinder, MD;  Location: [facility];  Service: General;  Laterality: Right;     ALLERGIES:  Allergies  Allergen Reactions  . Dust Mite Extract   . Pollen Extract      CURRENT MEDICATIONS:  Current Outpatient Prescriptions on File Prior to Visit  Medication Sig Dispense Refill  . ALPRAZolam (XANAX) 0.5 MG tablet TAKE 1 TABLET BY MOUTH EVERY DAY AS NEEDED 30 tablet 1  . atorvastatin (LIPITOR) 10 MG tablet Take 1 tablet (10 mg total) by mouth daily. 90 tablet 1  . b complex vitamins tablet Take 1 tablet by mouth daily.     . Calcium Carbonate-Vitamin D (CALCIUM-VITAMIN D) 500-200 MG-UNIT per tablet Take 3 tablets by mouth every morning.      . cetirizine (ZYRTEC) 10 MG tablet Take 10 mg by mouth daily as needed for allergies.     . diphenhydrAMINE (BENADRYL) 25 MG  tablet Take 25 mg by mouth daily as needed for allergies.     . fluticasone (FLONASE) 50 MCG/ACT nasal spray Place 2 sprays into both nostrils daily. 16 g 6  . metoprolol succinate (TOPROL-XL) 50 MG 24 hr tablet Take 1 tablet (50 mg total) by mouth daily. Take with or immediately following a meal. 30 tablet 11  . Multiple Vitamin (MULTIVITAMIN) tablet Take 1 tablet by mouth daily.      . non-metallic deodorant Jethro Poling) MISC Apply 1 application topically daily as needed.    . tobramycin-dexamethasone (TOBRADEX) ophthalmic solution Place 1 drop into both eyes 2 (two) times daily. 5 mL 0  . Wound Cleansers (RADIAPLEX EX) Apply topically.    . tamoxifen (NOLVADEX) 20 MG tablet Take 1 tablet (20 mg total) by mouth daily. (Patient not taking: Reported on 02/10/2015) 90 tablet 12   No current facility-administered medications on file prior to visit.     ONCOLOGIC FAMILY HISTORY:  Family History  Problem Relation Age of Onset  . Breast cancer Sister 9  . Hypertension Mother   . Kidney disease Father   . Diabetes Paternal Aunt      GENETIC COUNSELING/TESTING: None  SOCIAL HISTORY:  Arlyne Brandes is married and lives with her spouse in [place], [place].  She has 2 children.  Ms. Tatum is currently not working but is considering returning to work and potentially doing some volunteer work.  She denies any current use of tobacco or illicit drug use.  Past smoker.  Rare alcohol use on a social basis.   PHYSICAL EXAMINATION:  Vital Signs:   Filed Vitals:   02/10/15 0848  BP: 131/70  Pulse: 69  Temp: 97.9 F (36.6 C)  Resp: 18   ECOG Performance status: 0 General: Well-nourished, well-appearing female in no acute distress.  She is unaccompanied in clinic today.   HEENT: Head is atraumatic and normocephalic.  Pupils equal and reactive to light and accomodation. Conjunctivae clear without exudate.  Sclerae anicteric. Oral mucosa is pink, moist, and intact without lesions.  Oropharynx  is pink without lesions or erythema.  Lymph: No cervical, supraclavicular, infraclavicular, or axillary lymphadenopathy noted on palpation.  Cardiovascular: Regular rate and rhythm without murmurs, rubs, or gallops. Respiratory: Clear to auscultation bilaterally. Chest expansion symmetric without accessory muscle use on inspiration or expiration.  GI: Abdomen soft and round. No tenderness to palpation. Bowel sounds normoactive in 4 quadrants. GU: Deferred.  Musculoskeletal: Muscle strength 5/5 in all extremities.  Neuro: No focal deficits. Steady gait.  Psych: Mood and affect normal and appropriate for situation.  Extremities: No edema, cyanosis, or clubbing.  Skin: Warm and dry. No open lesions noted.   LABORATORY DATA:  None for this visit.  DIAGNOSTIC IMAGING:  None for this visit.     ASSESSMENT AND PLAN:   1. History of breast cancer: Stage IIA IDC (T1bN1M0) with DCIS, ER/PR+, HER2/neu negative, S/P lumpectomy, adjuvant chemotherapy with docetaxel and cyclophosphamide x 4 cycles, adjuvant radiation therapy, on endocrine therapy to begin February 14, 2015, followed in a program of surveillance.    Ms. Riedlinger is doing well without clinical symptoms worrisome for disease recurrence at this time.  Shewill follow-up with her medical oncologist,  Dr. Jana Hakim, in December 2016 and Dr. Isidore Moos, her radiation oncologist, in October 2016, with history and physical exam per surveillance protocol. I have asked her to report to Dr. Jana Hakim or myself if she begins to experience any side effects of the medication and I could see her back in clinic to help manage those side effects, as needed. Side effects of Tamoxifen were again reviewed with her as well. A comprehensive survivorship care plan and treatment summary was reviewed with the patient today detailing her breast cancer diagnosis, treatment course, potential late/long-term effects of treatment, appropriate follow-up care with recommendations for  the future, and patient education resources.  A copy of this summary, along with a letter will be sent to the patient's primary care provider via in basket message after today's visit.  Ms. Kendra is welcome to return to the Survivorship Clinic in the future, as needed; no follow-up will be scheduled at this time.  I will discuss her port-a-cath removal with Dr. Cristal Generous office ASAP so that we can get it scheduled.  As she has not had her port a cath flushed since completion of her chemotherapy (July 2016), we will attempt to flush it today.  2. Cancer screening:  Due to Ms. Lemelin's history and her age, she should receive screening for skin cancers and colon cancer. The information and recommendations are listed on the patient's comprehensive care plan/treatment summary and were reviewed in detail with the patient.    3.  Health maintenance and wellness promotion: Ms. Karney was encouraged to consume 5-7 servings of fruits and vegetables per day. We reviewed the "Nutrition Rainbow" handout, as well as the handout about "Nutrition for Breast Cancer Survivors."  She was also encouraged to continue to engage in moderate to vigorous exercise for 30 minutes per day most days of the week. We discussed the [employer] fitness program, which is designed for cancer survivors to help them become more physically fit after cancer treatments.  She was instructed to limit her alcohol consumption and continue to abstain from tobacco use.   4. Support services/counseling: It is not uncommon for this period of the patient's cancer care trajectory to be one of many emotions and stressors.  We discussed an opportunity for her to participate in the next session of [facility] ("Finding Your New Normal") support group series designed for patients after they have completed treatment, which she expressed interest in doing.   Ms. Arshad was encouraged to take advantage of our many other support services programs, support groups,  and/or counseling in coping with her new life as a cancer survivor after completing anti-cancer treatment.  She was offered support today through active listening and expressive supportive counseling.  She was given information regarding our available services and encouraged to contact me with any questions or for help enrolling in any of our support group/programs.    A total of 50 minutes of face-to-face time was spent with this patient with greater than 50% of that time in counseling and care-coordination.   Sylvan Cheese, NP  Survivorship Program [facility] [phone]   Note: PRIMARY CARE PROVIDER [place], [place] [phone]

## 2015-02-20 ENCOUNTER — Ambulatory Visit: Payer: Medicare Other | Admitting: Radiation Oncology

## 2015-02-27 ENCOUNTER — Ambulatory Visit
Admission: RE | Admit: 2015-02-27 | Discharge: 2015-02-27 | Disposition: A | Discharge status: Home / Self Care | Payer: Medicare Other | Source: Ambulatory Visit | Attending: Radiation Oncology | Admitting: Radiation Oncology

## 2015-02-27 VITALS — BP 106/64 | HR 84 | Temp 98.2°F | Ht 61.0 in | Wt 135.8 lb

## 2015-02-27 DIAGNOSIS — C50511 Malignant neoplasm of lower-outer quadrant of right female breast: Secondary | ICD-10-CM

## 2015-02-27 NOTE — Progress Notes (Signed)
Radiation Oncology         [phone] ________________________________  Name: Jamie Dillon MRN: [id number]  Date: 02/27/2015  DOB: 10/23/1950  Follow-Up Visit Note  Outpatient  CC: Jamie Tower, MD  Magrinat, Gustav C, MD  Diagnosis and Prior Radiotherapy:    ICD-9-CM ICD-10-CM   1. Breast cancer of lower-outer quadrant of right female breast (HCC) 174.5 C50.511    STAGE II pT1b pN1 cM0 ER PR + HER2 (-) Right breast IDC  Radiation Treatment Dates: 12/10/2014-01/21/2015 Site/dose:  1) Right breast/ axilla  // 50 Gy in 25 fractions          2) Right breast boost / 10 Gy in 5 fractions  Narrative:  The patient returns today for routine follow-up. She reports mild fatigue, especially if unable to sleep through the night. She is currently working part time doing office work. Her skin to her right breast is intact without any abnormalities.  She is currently taking Tamoxifen. She reports starting to take it on October 1. She denies any side effects at this time. She has no questions or concerns at this time.  ALLERGIES:  is allergic to dust mite extract and pollen extract.  Meds: Current Outpatient Prescriptions  Medication Sig Dispense Refill  . ALPRAZolam (XANAX) 0.5 MG tablet TAKE 1 TABLET BY MOUTH EVERY DAY AS NEEDED 30 tablet 1  . atorvastatin (LIPITOR) 10 MG tablet Take 1 tablet (10 mg total) by mouth daily. 90 tablet 1  . b complex vitamins tablet Take 1 tablet by mouth daily.     . Calcium Carbonate-Vitamin D (CALCIUM-VITAMIN D) 500-200 MG-UNIT per tablet Take 3 tablets by mouth every morning.      . cetirizine (ZYRTEC) 10 MG tablet Take 10 mg by mouth daily as needed for allergies.     . diphenhydrAMINE (BENADRYL) 25 MG tablet Take 25 mg by mouth daily as needed for allergies.     . fluticasone (FLONASE) 50 MCG/ACT nasal spray Place 2 sprays into both nostrils daily. 16 g 6  . lisinopril-hydrochlorothiazide (PRINZIDE,ZESTORETIC) 20-25 MG tablet Take 1 tablet by mouth daily.     . metoprolol succinate (TOPROL-XL) 50 MG 24 hr tablet Take 1 tablet (50 mg total) by mouth daily. Take with or immediately following a meal. 30 tablet 11  . Multiple Vitamin (MULTIVITAMIN) tablet Take 1 tablet by mouth daily.      . tamoxifen (NOLVADEX) 10 MG tablet Take 10 mg by mouth daily.     No current facility-administered medications for this encounter.    Physical Findings: The patient is in no acute distress. Patient is alert and oriented.  height is 5' 1" (1.549 m) and weight is 135 lb 12.8 oz (61.598 kg). Her temperature is 98.2 F (36.8 Dillon). Her blood pressure is 106/64 and her pulse is 84. .    The skin over her right breast has healed extremely well. No residual dryness or significant erythema.  Lab Findings: Lab Results  Component Value Date   WBC 4.5 01/12/2015   HGB 13.1 01/12/2015   HCT 38.3 01/12/2015   MCV 98.0 01/12/2015   PLT 196 01/12/2015    Radiographic Findings: No results found.  Impression/Plan: Healed well from radiation therapy. I encouraged her to continue with yearly mammography and followup with medical oncology. I will see her back on an as-needed basis. I have encouraged her to call if she has any issues or concerns in the future. I wished her the very best.   This document   serves as a record of services personally performed by Eppie Gibson, MD. It was created on her behalf by Darcus Austin, a trained medical scribe. The creation of this record is based on the scribe's personal observations and the provider's statements to them. This document has been checked and approved by the attending provider.     _____________________________________   Eppie Gibson, MD

## 2015-02-27 NOTE — Progress Notes (Signed)
Ms. Jamie Dillon is here for follow- up of radiation completed 01/21/2015 to her Right Breast. She reports mild fatigue, especially if unable to sleep through the night. She is currently working part time doing office work. Her skin to her right breast is intact without any abnormalities.  BP 106/64 mmHg  Pulse 84  Temp(Src) 98.2 F (36.8 C)  Ht 5\' 1"  (1.549 m)  Wt 135 lb 12.8 oz (61.598 kg)  BMI 25.67 kg/m2   Wt Readings from Last 3 Encounters:  02/27/15 135 lb 12.8 oz (61.598 kg)  02/10/15 135 lb (61.236 kg)  01/21/15 133 lb 8 oz (60.555 kg)

## 2015-03-05 ENCOUNTER — Encounter (HOSPITAL_BASED_OUTPATIENT_CLINIC_OR_DEPARTMENT_OTHER): Payer: Self-pay | Admitting: *Deleted

## 2015-03-09 ENCOUNTER — Encounter (HOSPITAL_BASED_OUTPATIENT_CLINIC_OR_DEPARTMENT_OTHER)
Admission: RE | Admit: 2015-03-09 | Discharge: 2015-03-09 | Disposition: A | Discharge status: Home / Self Care | Payer: Medicare Other | Source: Ambulatory Visit | Attending: General Surgery | Admitting: General Surgery

## 2015-03-09 ENCOUNTER — Other Ambulatory Visit: Payer: Self-pay | Admitting: General Surgery

## 2015-03-09 DIAGNOSIS — Z9221 Personal history of antineoplastic chemotherapy: Secondary | ICD-10-CM | POA: Diagnosis not present

## 2015-03-09 DIAGNOSIS — Z87891 Personal history of nicotine dependence: Secondary | ICD-10-CM | POA: Diagnosis not present

## 2015-03-09 DIAGNOSIS — Z853 Personal history of malignant neoplasm of breast: Secondary | ICD-10-CM | POA: Diagnosis not present

## 2015-03-09 DIAGNOSIS — Z452 Encounter for adjustment and management of vascular access device: Secondary | ICD-10-CM | POA: Diagnosis present

## 2015-03-09 DIAGNOSIS — E785 Hyperlipidemia, unspecified: Secondary | ICD-10-CM | POA: Diagnosis not present

## 2015-03-09 DIAGNOSIS — F419 Anxiety disorder, unspecified: Secondary | ICD-10-CM | POA: Diagnosis not present

## 2015-03-09 DIAGNOSIS — I10 Essential (primary) hypertension: Secondary | ICD-10-CM | POA: Diagnosis not present

## 2015-03-09 LAB — BASIC METABOLIC PANEL
ANION GAP: 9 (ref 5–15)
BUN: 16 mg/dL (ref 6–20)
CHLORIDE: 106 mmol/L (ref 101–111)
CO2: 26 mmol/L (ref 22–32)
Calcium: 9.2 mg/dL (ref 8.9–10.3)
Creatinine, Ser: 0.92 mg/dL (ref 0.44–1.00)
GFR calc Af Amer: >60 mL/min (ref >60)
GLUCOSE: 123 mg/dL — AB (ref 65–99)
POTASSIUM: 3.2 mmol/L — AB (ref 3.5–5.1)
Sodium: 141 mmol/L (ref 135–145)

## 2015-03-12 ENCOUNTER — Encounter (HOSPITAL_BASED_OUTPATIENT_CLINIC_OR_DEPARTMENT_OTHER): Payer: Self-pay

## 2015-03-12 ENCOUNTER — Encounter (HOSPITAL_BASED_OUTPATIENT_CLINIC_OR_DEPARTMENT_OTHER)
Admission: RE | Disposition: A | Discharge status: Home / Self Care | Payer: Self-pay | Source: Ambulatory Visit | Attending: General Surgery

## 2015-03-12 ENCOUNTER — Ambulatory Visit (HOSPITAL_BASED_OUTPATIENT_CLINIC_OR_DEPARTMENT_OTHER): Payer: Medicare Other | Admitting: Anesthesiology

## 2015-03-12 ENCOUNTER — Ambulatory Visit (HOSPITAL_BASED_OUTPATIENT_CLINIC_OR_DEPARTMENT_OTHER)
Admission: RE | Admit: 2015-03-12 | Discharge: 2015-03-12 | Disposition: A | Discharge status: Home / Self Care | Payer: Medicare Other | Source: Ambulatory Visit | Attending: General Surgery | Admitting: General Surgery

## 2015-03-12 DIAGNOSIS — Z853 Personal history of malignant neoplasm of breast: Secondary | ICD-10-CM | POA: Insufficient documentation

## 2015-03-12 DIAGNOSIS — E785 Hyperlipidemia, unspecified: Secondary | ICD-10-CM | POA: Insufficient documentation

## 2015-03-12 DIAGNOSIS — F419 Anxiety disorder, unspecified: Secondary | ICD-10-CM | POA: Insufficient documentation

## 2015-03-12 DIAGNOSIS — Z9221 Personal history of antineoplastic chemotherapy: Secondary | ICD-10-CM | POA: Diagnosis not present

## 2015-03-12 DIAGNOSIS — Z452 Encounter for adjustment and management of vascular access device: Secondary | ICD-10-CM | POA: Insufficient documentation

## 2015-03-12 DIAGNOSIS — Z87891 Personal history of nicotine dependence: Secondary | ICD-10-CM | POA: Diagnosis not present

## 2015-03-12 DIAGNOSIS — I10 Essential (primary) hypertension: Secondary | ICD-10-CM | POA: Insufficient documentation

## 2015-03-12 HISTORY — PX: PORT-A-CATH REMOVAL: SHX5289

## 2015-03-12 LAB — POCT HEMOGLOBIN-HEMACUE: Hemoglobin: 13.4 g/dL (ref 12.0–15.0)

## 2015-03-12 SURGERY — REMOVAL PORT-A-CATH
Anesthesia: Monitor Anesthesia Care | Site: Chest

## 2015-03-12 MED ORDER — FENTANYL CITRATE (PF) 100 MCG/2ML IJ SOLN
INTRAMUSCULAR | Status: DC | PRN
  Administered 2015-03-12 (×2): 50 ug via INTRAVENOUS

## 2015-03-12 MED ORDER — ONDANSETRON HCL 4 MG/2ML IJ SOLN: 4.0000 mg | Freq: Once | INTRAMUSCULAR | Status: DC | PRN

## 2015-03-12 MED ORDER — MEPERIDINE HCL 25 MG/ML IJ SOLN: 6.2500 mg | INTRAMUSCULAR | Status: DC | PRN

## 2015-03-12 MED ORDER — LACTATED RINGERS IV SOLN
INTRAVENOUS | Status: DC
  Administered 2015-03-12: 07:00:00 via INTRAVENOUS

## 2015-03-12 MED ORDER — PROPOFOL 10 MG/ML IV BOLUS
INTRAVENOUS | Status: AC
  Filled 2015-03-12: qty 20

## 2015-03-12 MED ORDER — LIDOCAINE HCL (CARDIAC) 20 MG/ML IV SOLN
INTRAVENOUS | Status: DC | PRN
  Administered 2015-03-12: 25 mg via INTRAVENOUS

## 2015-03-12 MED ORDER — SCOPOLAMINE 1 MG/3DAYS TD PT72: 1.0000 | MEDICATED_PATCH | Freq: Once | TRANSDERMAL | Status: DC | PRN

## 2015-03-12 MED ORDER — ONDANSETRON HCL 4 MG/2ML IJ SOLN
INTRAMUSCULAR | Status: AC
  Filled 2015-03-12: qty 2

## 2015-03-12 MED ORDER — MIDAZOLAM HCL 2 MG/2ML IJ SOLN
INTRAMUSCULAR | Status: AC
  Filled 2015-03-12: qty 4

## 2015-03-12 MED ORDER — PROPOFOL 10 MG/ML IV BOLUS
INTRAVENOUS | Status: DC | PRN
  Administered 2015-03-12 (×4): 20 mg via INTRAVENOUS

## 2015-03-12 MED ORDER — LIDOCAINE HCL (CARDIAC) 20 MG/ML IV SOLN
INTRAVENOUS | Status: AC
  Filled 2015-03-12: qty 5

## 2015-03-12 MED ORDER — BUPIVACAINE HCL (PF) 0.25 % IJ SOLN
INTRAMUSCULAR | Status: AC
  Filled 2015-03-12: qty 30

## 2015-03-12 MED ORDER — BUPIVACAINE HCL (PF) 0.25 % IJ SOLN
INTRAMUSCULAR | Status: DC | PRN
  Administered 2015-03-12: 5 mL

## 2015-03-12 MED ORDER — ONDANSETRON HCL 4 MG/2ML IJ SOLN
INTRAMUSCULAR | Status: DC | PRN
  Administered 2015-03-12: 4 mg via INTRAVENOUS

## 2015-03-12 MED ORDER — LIDOCAINE-EPINEPHRINE (PF) 1 %-1:200000 IJ SOLN
INTRAMUSCULAR | Status: DC | PRN
  Administered 2015-03-12: 5 mL

## 2015-03-12 MED ORDER — HYDROMORPHONE HCL 1 MG/ML IJ SOLN: 0.2500 mg | INTRAMUSCULAR | Status: DC | PRN

## 2015-03-12 MED ORDER — GLYCOPYRROLATE 0.2 MG/ML IJ SOLN: 0.2000 mg | Freq: Once | INTRAMUSCULAR | Status: DC | PRN

## 2015-03-12 MED ORDER — DEXAMETHASONE SODIUM PHOSPHATE 10 MG/ML IJ SOLN
INTRAMUSCULAR | Status: AC
  Filled 2015-03-12: qty 1

## 2015-03-12 MED ORDER — FENTANYL CITRATE (PF) 100 MCG/2ML IJ SOLN
INTRAMUSCULAR | Status: AC
  Filled 2015-03-12: qty 4

## 2015-03-12 MED ORDER — MIDAZOLAM HCL 5 MG/5ML IJ SOLN
INTRAMUSCULAR | Status: DC | PRN
  Administered 2015-03-12: 2 mg via INTRAVENOUS

## 2015-03-12 MED ORDER — LIDOCAINE-EPINEPHRINE (PF) 1 %-1:200000 IJ SOLN
INTRAMUSCULAR | Status: AC
  Filled 2015-03-12: qty 30

## 2015-03-12 SURGICAL SUPPLY — 32 items
BLADE SURG 15 STRL LF DISP TIS (BLADE) ×1 IMPLANT
BLADE SURG 15 STRL SS (BLADE) ×3
CHLORAPREP W/TINT 26ML (MISCELLANEOUS) ×3 IMPLANT
COVER BACK TABLE 60X90IN (DRAPES) ×3 IMPLANT
COVER MAYO STAND STRL (DRAPES) ×3 IMPLANT
DECANTER SPIKE VIAL GLASS SM (MISCELLANEOUS) ×1 IMPLANT
DRAPE LAPAROTOMY 100X72 PEDS (DRAPES) ×3 IMPLANT
ELECT COATED BLADE 2.86 ST (ELECTRODE) ×3 IMPLANT
ELECT REM PT RETURN 9FT ADLT (ELECTROSURGICAL) ×3
ELECTRODE REM PT RTRN 9FT ADLT (ELECTROSURGICAL) ×1 IMPLANT
GLOVE BIO SURGEON STRL SZ7 (GLOVE) ×3 IMPLANT
GLOVE BIOGEL PI IND STRL 7.0 (GLOVE) IMPLANT
GLOVE BIOGEL PI IND STRL 7.5 (GLOVE) ×1 IMPLANT
GLOVE BIOGEL PI INDICATOR 7.0 (GLOVE) ×4
GLOVE BIOGEL PI INDICATOR 7.5 (GLOVE) ×2
GLOVE ECLIPSE 6.5 STRL STRAW (GLOVE) ×2 IMPLANT
GOWN STRL REUS W/ TWL LRG LVL3 (GOWN DISPOSABLE) ×3 IMPLANT
GOWN STRL REUS W/TWL LRG LVL3 (GOWN DISPOSABLE) ×9
LIQUID BAND (GAUZE/BANDAGES/DRESSINGS) ×3 IMPLANT
MARKER SKIN DUAL TIP RULER LAB (MISCELLANEOUS) ×3 IMPLANT
NDL HYPO 25X1 1.5 SAFETY (NEEDLE) ×1 IMPLANT
NEEDLE HYPO 25X1 1.5 SAFETY (NEEDLE) ×3 IMPLANT
PACK BASIN DAY SURGERY FS (CUSTOM PROCEDURE TRAY) ×3 IMPLANT
PENCIL BUTTON HOLSTER BLD 10FT (ELECTRODE) ×3 IMPLANT
SLEEVE SCD COMPRESS KNEE MED (MISCELLANEOUS) IMPLANT
SUT MNCRL AB 4-0 PS2 18 (SUTURE) ×3 IMPLANT
SUT MON AB 4-0 PC3 18 (SUTURE) ×3 IMPLANT
SUT VIC AB 3-0 SH 27 (SUTURE) ×3
SUT VIC AB 3-0 SH 27X BRD (SUTURE) ×1 IMPLANT
SYR CONTROL 10ML LL (SYRINGE) ×3 IMPLANT
TOWEL OR 17X24 6PK STRL BLUE (TOWEL DISPOSABLE) ×3 IMPLANT
TOWEL OR NON WOVEN STRL DISP B (DISPOSABLE) ×3 IMPLANT

## 2015-03-12 NOTE — H&P (View-Only) (Signed)
CLINIC:  Cancer Survivorship   REASON FOR VISIT:  Routine follow-up post-treatment for a recent history of breast cancer.  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 HER2/neu repeated, negative (ratio 0.81)   07/21/2014 Pathologic Stage Stage IIA: pT1b pT1a pMx   09/15/2014 - 11/18/2014 Chemotherapy Adjuvant chemotherapy (Magrinat): cyclophosphamide and docetaxel q 3 weeks x 4 cycles; Neulasta support; neutropenic fever after C1 with 10% dose reductions made in both agents C2-C4.   12/10/2014 - 01/21/2015 Radiation Therapy Adjuvant RT completed Isidore Moos): Right breast 50 Gy over 25 fractions; right breast boost 10 Gy over 5 fractions. Total dose 60 Gy.    Anti-estrogen oral therapy Tamoxifen 20 mg daily to begin 02/14/2015. Planned duration of therapy: 10 years.    INTERVAL HISTORY:  Ms. Sandiford presents to the [facility] today for our initial meeting to review her survivorship care plan detailing her treatment course for breast cancer, as well as monitoring long-term side effects of that treatment, education regarding health maintenance, screening, and overall wellness and health promotion.     Overall, Ms. Olvera reports feeling quite well since completing her radiation therapy approximately 3 weeks ago. She continues with fatigue, but states that it is improving and states that the skin changes over her right breast are much  improved. She will have an occasional "shooting" pain along her right breast / chest wall, which have decreased in frequency.  These resolve without treatment. She reports a good appetite and denies any weight loss.  She denies any mass or nodularity about either breast, bone pain, or shortness of breath.  She has some headaches related to sinus drainage, but these are unchanged. She denies any fever or chills. She walks on the treadmill most days for 30 minutes.  She has not yet begun her Tamoxifen, but plans to start it February 14, 2015.  She has a follow up appointment with her radiation oncologist, Dr. Isidore Moos, in October 2016 and her medical oncologist, Dr. Jana Hakim, in December 2016.  She is awaiting scheduling of removal of her port-a-cath with Dr. Donne Hazel and will also see him for follow up in the Spring.  REVIEW OF SYSTEMS:  General: Headaches related to sinus / allergies as above. Denies night sweats. Cardiac: Occasional palpitation with anxiety symptoms, but this improves with deep breathing and medication.  Denies chest pain, and lower extremity edema.  Respiratory: Denies cough or dyspnea on exertion. GI: Denies abdominal pain, constipation, diarrhea, nausea, or vomiting.  GU: Occasional dysuria which clears when she increases her fluid intake.  Denies hematuria, vaginal bleeding, vaginal discharge, or vaginal dryness.  Musculoskeletal: Denies joint or bone pain.  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 Denies falls. Skin: Denies rash, pruritis, or open wounds.  Breast: Denies any new tenderness, nipple changes, or nipple discharge.  Psych: Occasional episodes of anxiety which predate cancer diagnosis and are manageable; improved by activity.  Also with some insomnia.  Denies depression.  A 14-point review of systems was completed and was negative, except as noted above.   ONCOLOGY TREATMENT TEAM:  1. Surgeon:  Dr. Donne Hazel at [facility]  2. Medical Oncologist: Dr. Jana Hakim 3. Radiation Oncologist: Dr. Isidore Moos    PAST MEDICAL/SURGICAL HISTORY:  Past Medical History  Diagnosis Date  . Allergic rhinitis   . HTN (hypertension)   . HLD (hyperlipidemia)   . Tobacco abuse   . Family history of malignant neoplasm of breast   . Family history of other kidney diseases   . Disorder of bone and cartilage, unspecified   . Breast cancer of lower-outer quadrant of right female breast 06/19/2014  . Anxiety   . PONV (postoperative nausea and vomiting)    Past Surgical History  Procedure Laterality Date  . Total vaginal hysterectomy  1996    endometriosis  . Abdominal hysterectomy    . Appendectomy    . Colonoscopy    . Dilation and curettage of uterus    . Breast lumpectomy with needle localization and axillary sentinel lymph node bx Right 07/21/2014    Procedure: BREAST LUMPECTOMY WITH NEEDLE LOCALIZATION AND AXILLARY SENTINEL LYMPH NODE BIOPSY;  Surgeon: Rolm Bookbinder, MD;  Location: [facility];  Service: General;  Laterality: Right;  . Portacath placement Right 09/10/2014    Procedure: INSERTION PORT-A-CATH;  Surgeon: Rolm Bookbinder, MD;  Location: [facility];  Service: General;  Laterality: Right;     ALLERGIES:  Allergies  Allergen Reactions  . Dust Mite Extract   . Pollen Extract      CURRENT MEDICATIONS:  Current Outpatient Prescriptions on File Prior to Visit  Medication Sig Dispense Refill  . ALPRAZolam (XANAX) 0.5 MG tablet TAKE 1 TABLET BY MOUTH EVERY DAY AS NEEDED 30 tablet 1  . atorvastatin (LIPITOR) 10 MG tablet Take 1 tablet (10 mg total) by mouth daily. 90 tablet 1  . b complex vitamins tablet Take 1 tablet by mouth daily.     . Calcium Carbonate-Vitamin D (CALCIUM-VITAMIN D) 500-200 MG-UNIT per tablet Take 3 tablets by mouth every morning.      . cetirizine (ZYRTEC) 10 MG tablet Take 10 mg by mouth daily as needed for allergies.     . diphenhydrAMINE (BENADRYL) 25 MG  tablet Take 25 mg by mouth daily as needed for allergies.     . fluticasone (FLONASE) 50 MCG/ACT nasal spray Place 2 sprays into both nostrils daily. 16 g 6  . metoprolol succinate (TOPROL-XL) 50 MG 24 hr tablet Take 1 tablet (50 mg total) by mouth daily. Take with or immediately following a meal. 30 tablet 11  . Multiple Vitamin (MULTIVITAMIN) tablet Take 1 tablet by mouth daily.      . non-metallic deodorant Jethro Poling) MISC Apply 1 application topically daily as needed.    . tobramycin-dexamethasone (TOBRADEX) ophthalmic solution Place 1 drop into both eyes 2 (two) times daily. 5 mL 0  . Wound Cleansers (RADIAPLEX EX) Apply topically.    . tamoxifen (NOLVADEX) 20 MG tablet Take 1 tablet (20 mg total) by mouth daily. (Patient not taking: Reported on 02/10/2015) 90 tablet 12   No current facility-administered medications on file prior to visit.     ONCOLOGIC FAMILY HISTORY:  Family History  Problem Relation Age of Onset  . Breast cancer Sister 96  . Hypertension Mother   . Kidney disease Father   . Diabetes Paternal Aunt      GENETIC COUNSELING/TESTING: None  SOCIAL HISTORY:  Lynett Brasil is married and lives with her spouse in [place], [place].  She has 2 children.  Ms. Cockerell is currently not working but is considering returning to work and potentially doing some volunteer work.  She denies any current use of tobacco or illicit drug use.  Past smoker.  Rare alcohol use on a social basis.   PHYSICAL EXAMINATION:  Vital Signs:   Filed Vitals:   02/10/15 0848  BP: 131/70  Pulse: 69  Temp: 97.9 F (36.6 C)  Resp: 18   ECOG Performance status: 0 General: Well-nourished, well-appearing female in no acute distress.  She is unaccompanied in clinic today.   HEENT: Head is atraumatic and normocephalic.  Pupils equal and reactive to light and accomodation. Conjunctivae clear without exudate.  Sclerae anicteric. Oral mucosa is pink, moist, and intact without lesions.  Oropharynx  is pink without lesions or erythema.  Lymph: No cervical, supraclavicular, infraclavicular, or axillary lymphadenopathy noted on palpation.  Cardiovascular: Regular rate and rhythm without murmurs, rubs, or gallops. Respiratory: Clear to auscultation bilaterally. Chest expansion symmetric without accessory muscle use on inspiration or expiration.  GI: Abdomen soft and round. No tenderness to palpation. Bowel sounds normoactive in 4 quadrants. GU: Deferred.  Musculoskeletal: Muscle strength 5/5 in all extremities.  Neuro: No focal deficits. Steady gait.  Psych: Mood and affect normal and appropriate for situation.  Extremities: No edema, cyanosis, or clubbing.  Skin: Warm and dry. No open lesions noted.   LABORATORY DATA:  None for this visit.  DIAGNOSTIC IMAGING:  None for this visit.     ASSESSMENT AND PLAN:   1. History of breast cancer: Stage IIA IDC (T1bN1M0) with DCIS, ER/PR+, HER2/neu negative, S/P lumpectomy, adjuvant chemotherapy with docetaxel and cyclophosphamide x 4 cycles, adjuvant radiation therapy, on endocrine therapy to begin February 14, 2015, followed in a program of surveillance.    Ms. Harewood is doing well without clinical symptoms worrisome for disease recurrence at this time.  Shewill follow-up with her medical oncologist,  Dr. Jana Hakim, in December 2016 and Dr. Isidore Moos, her radiation oncologist, in October 2016, with history and physical exam per surveillance protocol. I have asked her to report to Dr. Jana Hakim or myself if she begins to experience any side effects of the medication and I could see her back in clinic to help manage those side effects, as needed. Side effects of Tamoxifen were again reviewed with her as well. A comprehensive survivorship care plan and treatment summary was reviewed with the patient today detailing her breast cancer diagnosis, treatment course, potential late/long-term effects of treatment, appropriate follow-up care with recommendations for  the future, and patient education resources.  A copy of this summary, along with a letter will be sent to the patient's primary care provider via in basket message after today's visit.  Ms. Bartelt is welcome to return to the Survivorship Clinic in the future, as needed; no follow-up will be scheduled at this time.  I will discuss her port-a-cath removal with Dr. Cristal Generous office ASAP so that we can get it scheduled.  As she has not had her port a cath flushed since completion of her chemotherapy (July 2016), we will attempt to flush it today.  2. Cancer screening:  Due to Ms. Talton's history and her age, she should receive screening for skin cancers and colon cancer. The information and recommendations are listed on the patient's comprehensive care plan/treatment summary and were reviewed in detail with the patient.    3.  Health maintenance and wellness promotion: Ms. Louis was encouraged to consume 5-7 servings of fruits and vegetables per day. We reviewed the "Nutrition Rainbow" handout, as well as the handout about "Nutrition for Breast Cancer Survivors."  She was also encouraged to continue to engage in moderate to vigorous exercise for 30 minutes per day most days of the week. We discussed the [employer] fitness program, which is designed for cancer survivors to help them become more physically fit after cancer treatments.  She was instructed to limit her alcohol consumption and continue to abstain from tobacco use.   4. Support services/counseling: It is not uncommon for this period of the patient's cancer care trajectory to be one of many emotions and stressors.  We discussed an opportunity for her to participate in the next session of [facility] ("Finding Your New Normal") support group series designed for patients after they have completed treatment, which she expressed interest in doing.   Ms. Blakeman was encouraged to take advantage of our many other support services programs, support groups,  and/or counseling in coping with her new life as a cancer survivor after completing anti-cancer treatment.  She was offered support today through active listening and expressive supportive counseling.  She was given information regarding our available services and encouraged to contact me with any questions or for help enrolling in any of our support group/programs.    A total of 50 minutes of face-to-face time was spent with this patient with greater than 50% of that time in counseling and care-coordination.   Sylvan Cheese, NP  Survivorship Program [facility] [phone]   Note: PRIMARY CARE PROVIDER [place], [place] [phone]

## 2015-03-12 NOTE — Anesthesia Preprocedure Evaluation (Signed)
Anesthesia Evaluation  Patient identified by MRN, date of birth, ID band Patient awake    Reviewed: Allergy & Precautions, NPO status , Patient's Chart, lab work & pertinent test results  History of Anesthesia Complications (+) PONV  Airway Mallampati: I  TM Distance: >3 FB Neck ROM: Full    Dental   Pulmonary former smoker,    Pulmonary exam normal        Cardiovascular hypertension, Pt. on medications Normal cardiovascular exam     Neuro/Psych Anxiety    GI/Hepatic   Endo/Other    Renal/GU      Musculoskeletal   Abdominal   Peds  Hematology   Anesthesia Other Findings   Reproductive/Obstetrics                             Anesthesia Physical Anesthesia Plan  ASA: II  Anesthesia Plan: MAC   Post-op Pain Management:    Induction: Intravenous  Airway Management Planned:   Additional Equipment:   Intra-op Plan:   Post-operative Plan:   Informed Consent:   Plan Discussed with: CRNA and Surgeon  Anesthesia Plan Comments:         Anesthesia Quick Evaluation

## 2015-03-12 NOTE — Anesthesia Procedure Notes (Signed)
Procedure Name: MAC Date/Time: 03/12/2015 7:31 AM Performed by: Marrianne Mood Pre-anesthesia Checklist: Patient identified, Timeout performed, Emergency Drugs available, Suction available and Patient being monitored Patient Re-evaluated:Patient Re-evaluated prior to inductionOxygen Delivery Method: Simple face mask

## 2015-03-12 NOTE — Interval H&P Note (Signed)
History and Physical Interval Note:  03/12/2015 7:12 AM  Jamie Dillon  has presented today for surgery, with the diagnosis of RIGHT BREAST CANCER  The various methods of treatment have been discussed with the patient and family. After consideration of risks, benefits and other options for treatment, the patient has consented to  Procedure(s): REMOVAL PORT-A-CATH (N/A) as a surgical intervention .  The patient's history has been reviewed, patient examined, no change in status, stable for surgery.  I have reviewed the patient's chart and labs.  Questions were answered to the patient's satisfaction.     [name]

## 2015-03-12 NOTE — Op Note (Signed)
Preoperative diagnosis: breast cancer, no longer needs venous access Postoperative diagnosis: same as above Procedure: port removal Surgeon: Dr Serita Grammes EBL: minimal Anesthesia local mac Specimens none Drains none Complications none Sponge count correct dispo to recovery stable  Indications: This is a 3 yof presents for port removal after undergoing chemotherapy for breast cancer.  Procedure: After informed consent was obtained she was taken to the or. She was placed under MAC. She was prepped and draped in the standard sterile surgical fashion. Timeout was performed.  I infiltrated marcaine and lidocaine in region of port and old scar. I reentered the old incision. The port and line were removed in their entirety. Hemostasis was observed. I then closed with 3-0 vicryl, 4-0 monocryl and glue. She tolerated well and was transferred to recovery stable.

## 2015-03-12 NOTE — Discharge Instructions (Signed)
PORT-A-CATH: POST OP INSTRUCTIONS  Always review your discharge instruction sheet given to you by the facility where your surgery was performed.   1. A prescription for pain medication may be given to you upon discharge. Take your pain medication as prescribed, if needed. If narcotic pain medicine is not needed, then you make take acetaminophen (Tylenol) or ibuprofen (Advil) as needed.  2. Take your usually prescribed medications unless otherwise directed. 3. If you need a refill on your pain medication, please contact our office. All narcotic pain medicine now requires a paper prescription.  Phoned in and fax refills are no longer allowed by law.  Prescriptions will not be filled after 5 pm or on weekends.  4. You should follow a light diet for the remainder of the day after your procedure. 5. Most patients will experience some mild swelling and/or bruising in the area of the incision. It may take several days to resolve. 6. It is common to experience some constipation if taking pain medication after surgery. Increasing fluid intake and taking a stool softener (such as Colace) will usually help or prevent this problem from occurring. A mild laxative (Milk of Magnesia or Miralax) should be taken according to package directions if there are no bowel movements after 48 hours.  7. Unless discharge instructions indicate otherwise, you may remove your bandages 48 hours after surgery, and you may shower at that time. You may have steri-strips (small white skin tapes) in place directly over the incision.  These strips should be left on the skin for 7-10 days.  If your surgeon used Dermabond (skin glue) on the incision, you may shower in 24 hours.  The glue will flake off over the next 2-3 weeks.  8. If your port is left accessed at the end of surgery (needle left in port), the dressing cannot get wet and should only by changed by a healthcare professional. When the port is no longer accessed (when the  needle has been removed), follow step 7.   9. ACTIVITIES:  Limit activity involving your arms for the next 72 hours. Do no strenuous exercise or activity for 1 week. You may drive when you are no longer taking prescription pain medication, you can comfortably wear a seatbelt, and you can maneuver your car. 10.You may need to see your doctor in the office for a follow-up appointment.  Please       check with your doctor.  11.When you receive a new Port-a-Cath, you will get a product guide and        ID card.  Please keep them in case you need them.  WHEN TO CALL YOUR DOCTOR [phone]): 1. Fever over 101.0 2. Chills 3. Continued bleeding from incision 4. Increased redness and tenderness at the site 5. Shortness of breath, difficulty breathing   The clinic staff is available to answer your questions during regular business hours. Please dont hesitate to call and ask to speak to one of the nurses or medical assistants for clinical concerns. If you have a medical emergency, go to the nearest emergency room or call 911.  A surgeon from [facility] is always on call at the hospital.     For further information, please visit [email or web address]    Post Anesthesia Home Care Instructions  Activity: Get plenty of rest for the remainder of the day. A responsible adult should stay with you for 24 hours following the procedure.  For the next 24 hours, DO NOT: -Drive a  car -Paediatric nurse -Drink alcoholic beverages -Take any medication unless instructed by your physician -Make any legal decisions or sign important papers.  Meals: Start with liquid foods such as gelatin or soup. Progress to regular foods as tolerated. Avoid greasy, spicy, heavy foods. If nausea and/or vomiting occur, drink only clear liquids until the nausea and/or vomiting subsides. Call your physician if vomiting continues.  Special Instructions/Symptoms: Your throat may feel dry or sore from  the anesthesia or the breathing tube placed in your throat during surgery. If this causes discomfort, gargle with warm salt water. The discomfort should disappear within 24 hours.  If you had a scopolamine patch placed behind your ear for the management of post- operative nausea and/or vomiting:  1. The medication in the patch is effective for 72 hours, after which it should be removed.  Wrap patch in a tissue and discard in the trash. Wash hands thoroughly with soap and water. 2. You may remove the patch earlier than 72 hours if you experience unpleasant side effects which may include dry mouth, dizziness or visual disturbances. 3. Avoid touching the patch. Wash your hands with soap and water after contact with the patch.   Call your surgeon if you experience:   1.  Fever over 101.0. 2.  Inability to urinate. 3.  Nausea and/or vomiting. 4.  Extreme swelling or bruising at the surgical site. 5.  Continued bleeding from the incision. 6.  Increased pain, redness or drainage from the incision. 7.  Problems related to your pain medication. 8. Any change in color, movement and/or sensation 9. Any problems and/or concerns

## 2015-03-12 NOTE — Transfer of Care (Signed)
Immediate Anesthesia Transfer of Care Note  Patient: Jamie Dillon  Procedure(s) Performed: Procedure(s): REMOVAL PORT-A-CATH (N/A)  Patient Location: PACU  Anesthesia Type:MAC  Level of Consciousness: awake  Airway & Oxygen Therapy: Patient Spontanous Breathing  Post-op Assessment: Report given to RN and Post -op Vital signs reviewed and stable  Post vital signs: Reviewed and stable  Last Vitals:  Filed Vitals:   03/12/15 0753  BP:   Pulse: 77  Temp:   Resp: 27    Complications: No apparent anesthesia complications

## 2015-03-12 NOTE — Anesthesia Postprocedure Evaluation (Signed)
  Anesthesia Post-op Note  Patient: Jamie Dillon  Procedure(s) Performed: Procedure(s): REMOVAL PORT-A-CATH (N/A)  Patient Location: PACU  Anesthesia Type:MAC  Level of Consciousness: awake and alert   Airway and Oxygen Therapy: Patient Spontanous Breathing  Post-op Pain: none  Post-op Assessment: Post-op Vital signs reviewed              Post-op Vital Signs: Reviewed  Last Vitals:  Filed Vitals:   03/12/15 0848  BP: 119/64  Pulse: 65  Temp: 36.7 C  Resp: 16    Complications: No apparent anesthesia complications

## 2015-03-13 ENCOUNTER — Encounter (HOSPITAL_BASED_OUTPATIENT_CLINIC_OR_DEPARTMENT_OTHER): Payer: Self-pay | Admitting: General Surgery

## 2015-03-16 ENCOUNTER — Encounter: Payer: Self-pay | Admitting: Adult Health

## 2015-03-16 NOTE — Progress Notes (Signed)
A birthday card was mailed to the patient today on behalf of the Survivorship Program at [facility].   Derrin Currey, NP Survivorship Program [facility] [phone]  

## 2015-04-15 ENCOUNTER — Other Ambulatory Visit: Payer: Self-pay | Admitting: Family Medicine

## 2015-04-16 ENCOUNTER — Other Ambulatory Visit (HOSPITAL_BASED_OUTPATIENT_CLINIC_OR_DEPARTMENT_OTHER): Payer: Medicare Other

## 2015-04-16 DIAGNOSIS — C50511 Malignant neoplasm of lower-outer quadrant of right female breast: Secondary | ICD-10-CM

## 2015-04-16 LAB — COMPREHENSIVE METABOLIC PANEL (CC13)
ALBUMIN: 3.7 g/dL (ref 3.5–5.0)
ALT: 15 U/L (ref 0–55)
AST: 21 U/L (ref 5–34)
Alkaline Phosphatase: 53 U/L (ref 40–150)
Anion Gap: 8 mEq/L (ref 3–11)
BILIRUBIN TOTAL: 0.52 mg/dL (ref 0.20–1.20)
BUN: 18.9 mg/dL (ref 7.0–26.0)
CHLORIDE: 108 meq/L (ref 98–109)
CO2: 26 meq/L (ref 22–29)
CREATININE: 0.9 mg/dL (ref 0.6–1.1)
Calcium: 9.5 mg/dL (ref 8.4–10.4)
EGFR: 67 mL/min/{1.73_m2} — ABNORMAL LOW (ref >90)
GLUCOSE: 114 mg/dL (ref 70–140)
POTASSIUM: 3.5 meq/L (ref 3.5–5.1)
SODIUM: 141 meq/L (ref 136–145)
Total Protein: 6.6 g/dL (ref 6.4–8.3)

## 2015-04-16 LAB — CBC WITH DIFFERENTIAL/PLATELET
BASO%: 1.2 % (ref 0.0–2.0)
BASOS ABS: 0.1 10*3/uL (ref 0.0–0.1)
EOS%: 2.5 % (ref 0.0–7.0)
Eosinophils Absolute: 0.1 10*3/uL (ref 0.0–0.5)
HCT: 34.4 % — ABNORMAL LOW (ref 34.8–46.6)
HEMOGLOBIN: 12 g/dL (ref 11.6–15.9)
LYMPH%: 28.9 % (ref 14.0–49.7)
MCH: 32.5 pg (ref 25.1–34.0)
MCHC: 34.7 g/dL (ref 31.5–36.0)
MCV: 93.6 fL (ref 79.5–101.0)
MONO#: 0.7 10*3/uL (ref 0.1–0.9)
MONO%: 13.2 % (ref 0.0–14.0)
NEUT#: 2.9 10*3/uL (ref 1.5–6.5)
NEUT%: 54.2 % (ref 38.4–76.8)
Platelets: 217 10*3/uL (ref 145–400)
RBC: 3.68 10*6/uL — ABNORMAL LOW (ref 3.70–5.45)
RDW: 13.5 % (ref 11.2–14.5)
WBC: 5.4 10*3/uL (ref 3.9–10.3)
lymph#: 1.5 10*3/uL (ref 0.9–3.3)

## 2015-04-18 ENCOUNTER — Telehealth: Payer: Self-pay | Admitting: Family Medicine

## 2015-04-18 DIAGNOSIS — E7800 Pure hypercholesterolemia, unspecified: Secondary | ICD-10-CM

## 2015-04-18 DIAGNOSIS — I10 Essential (primary) hypertension: Secondary | ICD-10-CM

## 2015-04-18 NOTE — Telephone Encounter (Signed)
-----   Message from Ellamae Sia sent at 04/14/2015  4:16 PM EST ----- Regarding: Lab orders for, Monday 12.5.16 Patient is scheduled for CPX labs, please order future labs, Thanks , Karna Christmas

## 2015-04-20 ENCOUNTER — Other Ambulatory Visit (INDEPENDENT_AMBULATORY_CARE_PROVIDER_SITE_OTHER): Payer: Medicare Other

## 2015-04-20 DIAGNOSIS — E7800 Pure hypercholesterolemia, unspecified: Secondary | ICD-10-CM

## 2015-04-20 DIAGNOSIS — I10 Essential (primary) hypertension: Secondary | ICD-10-CM | POA: Diagnosis not present

## 2015-04-20 LAB — COMPREHENSIVE METABOLIC PANEL
ALBUMIN: 3.9 g/dL (ref 3.5–5.2)
ALT: 17 U/L (ref 0–35)
AST: 20 U/L (ref 0–37)
Alkaline Phosphatase: 47 U/L (ref 39–117)
BUN: 17 mg/dL (ref 6–23)
CO2: 27 mEq/L (ref 19–32)
CREATININE: 0.87 mg/dL (ref 0.40–1.20)
Calcium: 9.3 mg/dL (ref 8.4–10.5)
Chloride: 106 mEq/L (ref 96–112)
GFR: 69.22 mL/min (ref >60.00)
Glucose, Bld: 93 mg/dL (ref 70–99)
Potassium: 3.9 mEq/L (ref 3.5–5.1)
SODIUM: 142 meq/L (ref 135–145)
TOTAL PROTEIN: 6.8 g/dL (ref 6.0–8.3)
Total Bilirubin: 0.6 mg/dL (ref 0.2–1.2)

## 2015-04-20 LAB — CBC WITH DIFFERENTIAL/PLATELET
BASOS ABS: 0.1 10*3/uL (ref 0.0–0.1)
Basophils Relative: 1.2 % (ref 0.0–3.0)
EOS ABS: 0.1 10*3/uL (ref 0.0–0.7)
Eosinophils Relative: 2.5 % (ref 0.0–5.0)
HCT: 35.8 % — ABNORMAL LOW (ref 36.0–46.0)
HEMOGLOBIN: 12.2 g/dL (ref 12.0–15.0)
Lymphocytes Relative: 28.5 % (ref 12.0–46.0)
Lymphs Abs: 1.5 10*3/uL (ref 0.7–4.0)
MCHC: 34.2 g/dL (ref 30.0–36.0)
MCV: 94.4 fl (ref 78.0–100.0)
MONO ABS: 0.5 10*3/uL (ref 0.1–1.0)
Monocytes Relative: 9.6 % (ref 3.0–12.0)
Neutro Abs: 3 10*3/uL (ref 1.4–7.7)
Neutrophils Relative %: 58.2 % (ref 43.0–77.0)
Platelets: 223 10*3/uL (ref 150.0–400.0)
RBC: 3.79 Mil/uL — AB (ref 3.87–5.11)
RDW: 13.7 % (ref 11.5–15.5)
WBC: 5.2 10*3/uL (ref 4.0–10.5)

## 2015-04-20 LAB — LIPID PANEL
CHOLESTEROL: 148 mg/dL (ref 0–200)
HDL: 38.7 mg/dL — ABNORMAL LOW (ref >39.00)
LDL CALC: 77 mg/dL (ref 0–99)
NonHDL: 109.05
Total CHOL/HDL Ratio: 4
Triglycerides: 162 mg/dL — ABNORMAL HIGH (ref 0.0–149.0)
VLDL: 32.4 mg/dL (ref 0.0–40.0)

## 2015-04-20 LAB — TSH: TSH: 2.17 u[IU]/mL (ref 0.35–4.50)

## 2015-04-23 ENCOUNTER — Ambulatory Visit (HOSPITAL_BASED_OUTPATIENT_CLINIC_OR_DEPARTMENT_OTHER): Payer: Medicare Other | Admitting: Oncology

## 2015-04-23 VITALS — BP 125/74 | HR 89 | Temp 97.8°F | Resp 18 | Ht 61.0 in | Wt 132.7 lb

## 2015-04-23 DIAGNOSIS — Z170 Estrogen receptor positive status [ER+]: Secondary | ICD-10-CM

## 2015-04-23 DIAGNOSIS — Z79810 Long term (current) use of selective estrogen receptor modulators (SERMs): Secondary | ICD-10-CM | POA: Diagnosis not present

## 2015-04-23 DIAGNOSIS — M8580 Other specified disorders of bone density and structure, unspecified site: Secondary | ICD-10-CM

## 2015-04-23 DIAGNOSIS — C50511 Malignant neoplasm of lower-outer quadrant of right female breast: Secondary | ICD-10-CM

## 2015-04-23 MED ORDER — TAMOXIFEN CITRATE 10 MG PO TABS: 10.0000 mg | ORAL_TABLET | Freq: Every day | ORAL | Status: DC

## 2015-04-23 NOTE — Progress Notes (Signed)
[place]  [phone]) [phone] Fax:(336) [phone]     ID: Jamie Dillon DOB: [birth year]  MR#: [id number]  ACZ#:[id number]  Patient Care Team: Abner Greenspan, MD as PCP - General Rolm Bookbinder, MD as Consulting Physician (General Surgery) Chauncey Cruel, MD as Consulting Physician (Oncology) Eppie Gibson, MD as Attending Physician (Radiation Oncology) Rockwell Germany, RN as Registered Nurse Mauro Kaufmann, RN as Registered Nurse Holley Bouche, NP as Nurse Practitioner (Nurse Practitioner) Sylvan Cheese, NP as Nurse Practitioner (Nurse Practitioner) OTHER MD:  CHIEF COMPLAINT:  Estrogen receptor positive breast cancer  CURRENT TREATMENT: Tamoxifen   BREAST CANCER HISTORY: From the original intake note:  Jamie Dillon had routine screening mammography at [facility] 06/05/2014. The breast density was category B. There was a 6 mm irregular mass in the right breast. On 06/11/2014 the patient underwent right breast ultrasonography at [facility]. This confirmed a 7 mm all her than wide irregular mass in the right blast at the 8:00 position. 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  The patient's subsequent history is as detailed below  INTERVAL HISTORY: Jamie Dillon returns today for follow-up of her estrogen receptor positive breast cancer, accompanied by her husband Jamie Dillon. Since her last visit here she completed her radiation treatments. She found them "easier than the chemotherapy". Generally she did well although she still has some tenderness in the irradiated breast. Then on 02/14/2015 she started tamoxifen. So far she has had no hot flashes, vaginal discharge, or other side effects that  she is aware of. She obtains 3 months of the drug for $28.  REVIEW OF SYSTEMS: She is back to exercising regularly, at least 30 minutes a day and the treadmill. On good days they walk outside. Her hair is coming in slowly. A detailed review of systems today is otherwise noncontributory  PAST MEDICAL HISTORY: Past Medical History  Diagnosis Date  . Allergic rhinitis   . HTN (hypertension)   . HLD (hyperlipidemia)   . Tobacco abuse   . Family history of malignant neoplasm of breast   . Family history of other kidney diseases   . Disorder of bone and cartilage, unspecified   . Breast cancer of lower-outer quadrant of right female breast ([place]) 06/19/2014  . Anxiety   . PONV (postoperative nausea and vomiting)     PAST SURGICAL HISTORY: Past Surgical History  Procedure Laterality Date  . Total vaginal hysterectomy  1996    endometriosis  . Abdominal hysterectomy    . Appendectomy    . Colonoscopy    . Dilation and curettage of uterus    . Breast lumpectomy with needle localization and axillary sentinel lymph node bx Right 07/21/2014    Procedure: BREAST LUMPECTOMY WITH NEEDLE LOCALIZATION AND AXILLARY SENTINEL LYMPH NODE BIOPSY;  Surgeon: Rolm Bookbinder, MD;  Location: [facility];  Service: General;  Laterality: Right;  . Portacath placement Right 09/10/2014    Procedure: INSERTION PORT-A-CATH;  Surgeon: Rolm Bookbinder, MD;  Location: [place];  Service: General;  Laterality: Right;  . Port-a-cath removal N/A 03/12/2015    Procedure: REMOVAL PORT-A-CATH;  Surgeon: Rolm Bookbinder, MD;  Location: [facility];  Service: General;  Laterality: N/A;    FAMILY HISTORY Family History  Problem  Relation Age of Onset  . Breast cancer Sister 8  . Hypertension Mother   . Kidney disease Father   . Diabetes Paternal Aunt    the patient's father died from kidney failure at the age of 38. The patient's mother is still living at age 28. The patient had no brothers,  one sister. That sister was diagnosed with breast cancer at age 103. The patient's father's mother was diagnosed with mouth cancer at the age of 21.  GYNECOLOGIC HISTORY:  No LMP recorded. Patient has had a hysterectomy. Menarche age 77, first live birth age 66. The patient is GX P2. She had a total abdominal hysterectomy with bilateral salpingo-oophorectomy in 1996. She took hormone replacement for approximately 10 years, until 2009. She also took oral contraceptives for approximately 7 years remotely, with no complications  SOCIAL HISTORY:  The patient is currently retired, though she still works part-time at the [employer] Her husband "Jamie Dillon" Jamie Dillon is retired from Press photographer. Son Jamie Dillon "Jamie Dillon" Jamie Dillon teaches English at [employer] in [place]. Daughter Jamie Dillon lives in [place] . She works as a Marine scientist at the [employer]    [place]: In [place]: Social History  Substance Use Topics  . Smoking status: Former Smoker -- 1.00 packs/day    Types: Cigarettes    Quit date: 06/25/2010  . Smokeless tobacco: Not on file  . Alcohol Use: 0.0 oz/week    0 Standard drinks or equivalent per week     Comment: occasional wine     Colonoscopy: 2010  PAP:  Bone density: December 2015  Lipid panel:  Allergies  Allergen Reactions  . Dust Mite Extract   . Pollen Extract     Current Outpatient Prescriptions  Medication Sig Dispense Refill  . ALPRAZolam (XANAX) 0.5 MG tablet TAKE 1 TABLET BY MOUTH EVERY DAY AS NEEDED 30 tablet 1  . atorvastatin (LIPITOR) 10 MG tablet Take 1 tablet (10 mg total) by mouth daily. 90 tablet 1  . b complex vitamins tablet Take 1 tablet by mouth daily.     . Calcium Carbonate-Vitamin D (CALCIUM-VITAMIN D) 500-200 MG-UNIT per tablet Take 3 tablets by mouth every morning.      . cetirizine (ZYRTEC) 10 MG tablet Take 10 mg by mouth daily as needed for allergies.     . diphenhydrAMINE  (BENADRYL) 25 MG tablet Take 25 mg by mouth daily as needed for allergies.     . fluticasone (FLONASE) 50 MCG/ACT nasal spray Place 2 sprays into both nostrils daily. 16 g 6  . lisinopril-hydrochlorothiazide (PRINZIDE,ZESTORETIC) 20-25 MG tablet Take 1 tablet by mouth daily.    . metoprolol succinate (TOPROL-XL) 50 MG 24 hr tablet TAKE 1 TABLET EVERY DAY TAKE WITH OR IMMEDIATELY FOLLOWING A MEAL 30 tablet 0  . Multiple Vitamin (MULTIVITAMIN) tablet Take 1 tablet by mouth daily.      . tamoxifen (NOLVADEX) 10 MG tablet Take 10 mg by mouth daily.     No current facility-administered medications for this visit.    OBJECTIVE: Middle-aged white woman who appears well Filed Vitals:   04/23/15 1432  BP: 125/74  Pulse: 89  Temp: 97.8 F (36.6 C)  Resp: 18     Body mass index is 25.09 kg/(m^2).    ECOG FS:0 - Asymptomatic  Sclerae unicteric, EOMs intact Oropharynx clear, no thrush or other lesions No cervical or supraclavicular adenopathy Lungs no rales or rhonchi Heart regular rate and rhythm Abd soft,  nontender, positive bowel sounds MSK no focal spinal tenderness, no upper extremity lymphedema Neuro: nonfocal, well oriented, appropriate affect Breasts: The right breast is status post lumpectomy and radiation. There is mild hyperpigmentation. There are no masses and no skin or nipple changes of concern. The right axilla is benign. The left breast is unremarkable.  LAB RESULTS:  CMP     Component Value Date/Time   NA 142 04/20/2015 0819   NA 141 04/16/2015 1006   K 3.9 04/20/2015 0819   K 3.5 04/16/2015 1006   CL 106 04/20/2015 0819   CO2 27 04/20/2015 0819   CO2 26 04/16/2015 1006   GLUCOSE 93 04/20/2015 0819   GLUCOSE 114 04/16/2015 1006   BUN 17 04/20/2015 0819   BUN 18.9 04/16/2015 1006   CREATININE 0.87 04/20/2015 0819   CREATININE 0.9 04/16/2015 1006   CALCIUM 9.3 04/20/2015 0819   CALCIUM 9.5 04/16/2015 1006   PROT 6.8 04/20/2015 0819   PROT 6.6 04/16/2015 1006    ALBUMIN 3.9 04/20/2015 0819   ALBUMIN 3.7 04/16/2015 1006   AST 20 04/20/2015 0819   AST 21 04/16/2015 1006   ALT 17 04/20/2015 0819   ALT 15 04/16/2015 1006   ALKPHOS 47 04/20/2015 0819   ALKPHOS 53 04/16/2015 1006   BILITOT 0.6 04/20/2015 0819   BILITOT 0.52 04/16/2015 1006   GFRNONAA >60 03/09/2015 0847   GFRAA >60 03/09/2015 0847    INo results found for: SPEP, UPEP  Lab Results  Component Value Date   WBC 5.2 04/20/2015   NEUTROABS 3.0 04/20/2015   HGB 12.2 04/20/2015   HCT 35.8* 04/20/2015   MCV 94.4 04/20/2015   PLT 223.0 04/20/2015      Chemistry      Component Value Date/Time   NA 142 04/20/2015 0819   NA 141 04/16/2015 1006   K 3.9 04/20/2015 0819   K 3.5 04/16/2015 1006   CL 106 04/20/2015 0819   CO2 27 04/20/2015 0819   CO2 26 04/16/2015 1006   BUN 17 04/20/2015 0819   BUN 18.9 04/16/2015 1006   CREATININE 0.87 04/20/2015 0819   CREATININE 0.9 04/16/2015 1006      Component Value Date/Time   CALCIUM 9.3 04/20/2015 0819   CALCIUM 9.5 04/16/2015 1006   ALKPHOS 47 04/20/2015 0819   ALKPHOS 53 04/16/2015 1006   AST 20 04/20/2015 0819   AST 21 04/16/2015 1006   ALT 17 04/20/2015 0819   ALT 15 04/16/2015 1006   BILITOT 0.6 04/20/2015 0819   BILITOT 0.52 04/16/2015 1006       No results found for: LABCA2  No components found for: LABCA125  No results for input(s): INR in the last 168 hours.  Urinalysis    Component Value Date/Time   COLORURINE YELLOW 09/21/2014 1809   APPEARANCEUR CLOUDY* 09/21/2014 1809   LABSPEC 1.009 09/21/2014 1809   PHURINE 7.0 09/21/2014 1809   GLUCOSEU NEGATIVE 09/21/2014 1809   HGBUR MODERATE* 09/21/2014 1809   HGBUR negative 02/13/2008 0911   BILIRUBINUR Neg. 10/15/2014 1248   BILIRUBINUR NEGATIVE 09/21/2014 1809   KETONESUR NEGATIVE 09/21/2014 1809   PROTEINUR Neg. 10/15/2014 1248   PROTEINUR 30* 09/21/2014 1809   UROBILINOGEN 0.2 10/15/2014 1248   UROBILINOGEN 0.2 09/21/2014 1809   NITRITE Neg. 10/15/2014  1248   NITRITE POSITIVE* 09/21/2014 1809   LEUKOCYTESUR Negative 10/15/2014 1248    STUDIES: No results found.   ASSESSMENT: 64 y.o. [place] woman status post right breast biopsy 06/17/2014 for a clinical T1b N0,  stage I invasive ductal carcinoma, grade 2, estrogen receptor strongly positive, progesterone receptor moderately positive, with no HER-2 amplification and an MIB-1 of 22%.  (1) status post right lumpectomy and right axillary sentinel lymph node sampling 07/21/2014 for a pT1b pN1a, stage IIA invasive ductal carcinoma, grade 1, with negative margins, repeat HER-2/neu again negative.  (2) adjuvant chemotherapy consisting of cyclophosphamide and docetaxel started 09/15/2014, given every 3 weeks with Neulasta support, completed 11/18/2014  (a) Neutropenic fever and hospitalization 5 days after the first cycle: subsequent cycles 10% dose reduced  (3) adjuvant radiation 12/10/2014-01/21/2015: Right breast/ axilla // 50 Gy in 25 fractions Right breast boost / 10 Gy in 5 fractions  (4) started tamoxifen 02/14/2015  (5) osteopenia: bone density January 2016 shows a T score of -1.1 at the femoral neck  PLAN: Samanvi has completed her local treatment and chemotherapy and is doing very well with tamoxifen. The plan will be to continue that a minimum of 2 years, after which we can consider switching to an aromatase inhibitor. She can also consider continuing tamoxifen to 5 years and then switching or simply continuing on the same medication for the full 10 years.  Today we discussed the PALLAS in detail. She understands the possible benefits as well as the possible toxicities, side effects and complications of adding Palbociclib. She also understands this is a randomized study and she might get placebo instead of the active drug.  At this point she is not motivated but if she decides to explored further she will let us know and we will get an appointment for her with one of our research  nurses.  She will be seeing Dr. Donne Hazel in the spring. She will return to see me in 6 months. She knows to call for any problems that may develop before that visit.    Chauncey Cruel, MD   04/23/2015 2:49 PM

## 2015-04-28 ENCOUNTER — Encounter: Payer: Self-pay | Admitting: *Deleted

## 2015-04-28 ENCOUNTER — Encounter: Payer: Self-pay | Admitting: Family Medicine

## 2015-04-28 ENCOUNTER — Ambulatory Visit (INDEPENDENT_AMBULATORY_CARE_PROVIDER_SITE_OTHER): Payer: Medicare Other | Admitting: Family Medicine

## 2015-04-28 VITALS — BP 112/74 | HR 73 | Temp 98.7°F | Ht 61.0 in | Wt 131.5 lb

## 2015-04-28 DIAGNOSIS — E7800 Pure hypercholesterolemia, unspecified: Secondary | ICD-10-CM

## 2015-04-28 DIAGNOSIS — Z23 Encounter for immunization: Secondary | ICD-10-CM

## 2015-04-28 DIAGNOSIS — I10 Essential (primary) hypertension: Secondary | ICD-10-CM

## 2015-04-28 DIAGNOSIS — F419 Anxiety disorder, unspecified: Secondary | ICD-10-CM

## 2015-04-28 DIAGNOSIS — Z0000 Encounter for general adult medical examination without abnormal findings: Secondary | ICD-10-CM

## 2015-04-28 DIAGNOSIS — C50511 Malignant neoplasm of lower-outer quadrant of right female breast: Secondary | ICD-10-CM

## 2015-04-28 MED ORDER — METOPROLOL SUCCINATE ER 50 MG PO TB24: ORAL_TABLET | ORAL | Status: DC

## 2015-04-28 MED ORDER — LISINOPRIL-HYDROCHLOROTHIAZIDE 20-25 MG PO TABS: 1.0000 | ORAL_TABLET | Freq: Every day | ORAL | Status: DC

## 2015-04-28 MED ORDER — ATORVASTATIN CALCIUM 10 MG PO TABS: 10.0000 mg | ORAL_TABLET | Freq: Every day | ORAL | Status: DC

## 2015-04-28 NOTE — Patient Instructions (Signed)
prevnar vaccine today  Toxicology test for xanax today Take care of yourself  Keep exercising and watching diet  Labs look stable  No change in medicine

## 2015-04-28 NOTE — Progress Notes (Signed)
Pre visit review using our clinic review tool, if applicable. No additional management support is needed unless otherwise documented below in the visit note. 

## 2015-04-28 NOTE — Assessment & Plan Note (Signed)
Reviewed health habits including diet and exercise and skin cancer prevention Reviewed appropriate screening tests for age  Also reviewed health mt list, fam hx and immunization status , as well as social and family history   See HPI Labs reviewed  Enc continued exercise prevnar today  Due for mm in Jan - s/p breast ca and doing well

## 2015-04-28 NOTE — Assessment & Plan Note (Signed)
Reviewed health habits including diet and exercise and skin cancer prevention Reviewed appropriate screening tests for age  Also reviewed health mt list, fam hx and immunization status , as well as social and family history   See HPI Labs reviewed  Enc continued exercise prevnar today  Doing well s/p b cancer tx - for mm in Jan

## 2015-04-28 NOTE — Assessment & Plan Note (Signed)
Fairly good report Disc goals for lipids and reasons to control them Rev labs with pt Rev low sat fat diet in detail HDL not at goal- already exercises- she may try fish oil

## 2015-04-28 NOTE — Assessment & Plan Note (Signed)
bp in fair control at this time  BP Readings from Last 1 Encounters:  04/28/15 112/74   No changes needed Disc lifstyle change with low sodium diet and exercise  Labs reviewed

## 2015-04-28 NOTE — Assessment & Plan Note (Signed)
Doing well s/p surg/rad /chemo Continue oncol and surg f/u  Feels good! Tolerates tamoxifen well

## 2015-04-28 NOTE — Assessment & Plan Note (Signed)
Takes xanax infrequently  Last dose 2 d ago Due for tox screen today- expect it to be neg

## 2015-04-28 NOTE — Progress Notes (Signed)
Subjective:    Patient ID: Jamie Dillon, female    DOB: May 25, 1950, 64 y.o.   MRN: [id number]  HPI Here for annual medicare wellness visit as well as chronic/acute medical problems along with annual preventative exam  I have personally reviewed the Medicare Annual Wellness questionnaire and have noted 1. The patient's medical and social history 2. Their use of alcohol, tobacco or illicit drugs 3. Their current medications and supplements 4. The patient's functional ability including ADL's, fall risks, home safety risks and hearing or visual             impairment. 5. Diet and physical activities 6. Evidence for depression or mood disorders  The patients weight, height, BMI have been recorded in the chart and visual acuity is per eye clinic.  I have made referrals, counseling and provided education to the patient based review of the above and I have provided the pt with a written personalized care plan for preventive services. Reviewed and updated provider list, see scanned forms.  See scanned forms.  Routine anticipatory guidance given to patient.  See health maintenance. Colon cancer screening 11/10 - her recall was 10 years  Breast cancer screening- 1/16 dx with breast ca R - chemo and rad / and tamoxifen (tolerates well) Self breast exam-no new lumps  Flu vaccine 10/16 Tetanus vaccine 10/12 Pneumovax had the pna 12/15 , due for prevnar today Zoster vaccine 1/14  dexa 1/16 osteopenia - not bad/ she is taking her ca and D , exercising on treadmill every day  No falls or fracture  Advance directive- has a living will and poa  Cognitive function addressed- see scanned forms- and if abnormal then additional documentation follows. occ a little forgetful - walks in a room and forgets why / nothing more than that   [place] and [facility] reviewed  Meds, vitals, and allergies reviewed.   ROS: See HPI.  Otherwise negative.    Wt down 1 lb with bmi of 24  Eating pretty healthy  She would like  to loose a few lb    bp is stable today  No cp or palpitations or headaches or edema  No side effects to medicines  BP Readings from Last 3 Encounters:  04/28/15 112/74  04/23/15 125/74  03/12/15 119/64     Anxiety - has xanax - she still takes it once in a while  Last dose was 2 days ago   Cholesterol Lab Results  Component Value Date   CHOL 148 04/20/2015   CHOL 167 04/21/2014   CHOL 185 04/22/2013   Lab Results  Component Value Date   HDL 38.70* 04/20/2015   HDL 42.50 04/21/2014   HDL 42.40 04/22/2013   Lab Results  Component Value Date   LDLCALC 77 04/20/2015   [place] 95 04/21/2014   LDLCALC 116* 04/22/2013   Lab Results  Component Value Date   TRIG 162.0* 04/20/2015   TRIG 149.0 04/21/2014   TRIG 131.0 04/22/2013   Lab Results  Component Value Date   CHOLHDL 4 04/20/2015   CHOLHDL 4 04/21/2014   CHOLHDL 4 04/22/2013   Lab Results  Component Value Date   LDLDIRECT 127.3 03/01/2011    Pretty stable with diet /exercise and atorvastatin    Chemistry      Component Value Date/Time   NA 142 04/20/2015 0819   NA 141 04/16/2015 1006   K 3.9 04/20/2015 0819   K 3.5 04/16/2015 1006   CL 106 04/20/2015 0819   CO2  27 04/20/2015 0819   CO2 26 04/16/2015 1006   BUN 17 04/20/2015 0819   BUN 18.9 04/16/2015 1006   CREATININE 0.87 04/20/2015 0819   CREATININE 0.9 04/16/2015 1006      Component Value Date/Time   CALCIUM 9.3 04/20/2015 0819   CALCIUM 9.5 04/16/2015 1006   ALKPHOS 47 04/20/2015 0819   ALKPHOS 53 04/16/2015 1006   AST 20 04/20/2015 0819   AST 21 04/16/2015 1006   ALT 17 04/20/2015 0819   ALT 15 04/16/2015 1006   BILITOT 0.6 04/20/2015 0819   BILITOT 0.52 04/16/2015 1006     glucose 93 Lab Results  Component Value Date   TSH 2.17 04/20/2015    Lab Results  Component Value Date   WBC 5.2 04/20/2015   HGB 12.2 04/20/2015   HCT 35.8* 04/20/2015   MCV 94.4 04/20/2015   PLT 223.0 04/20/2015    Patient Active Problem List    Diagnosis Date Noted  . UTI (urinary tract infection) 09/22/2014  . Neutropenic fever ([place]) 09/21/2014  . Sepsis ([place]) 09/21/2014  . Sinus tachycardia ([place]) 09/21/2014  . Hyponatremia 09/21/2014  . UTI (lower urinary tract infection)   . Anxiety 07/08/2014  . Breast cancer of lower-outer quadrant of right female breast ([place]) 06/19/2014  . Encounter for Medicare annual wellness exam 04/25/2014  . Estrogen deficiency 04/25/2014  . ETD (eustachian tube dysfunction) 01/10/2014  . Stress reaction 01/03/2014  . Palpitations 01/03/2014  . Gynecological examination 03/04/2011  . Routine general medical examination at a health care facility 02/26/2011  . HYPERCHOLESTEROLEMIA, PURE 02/01/2007  . Essential hypertension 09/08/2006  . ALLERGIC RHINITIS 09/08/2006  . TOBACCO ABUSE, HX OF 09/08/2006   Past Medical History  Diagnosis Date  . Allergic rhinitis   . HTN (hypertension)   . HLD (hyperlipidemia)   . Tobacco abuse   . Family history of malignant neoplasm of breast   . Family history of other kidney diseases   . Disorder of bone and cartilage, unspecified   . Breast cancer of lower-outer quadrant of right female breast ([place]) 06/19/2014  . Anxiety   . PONV (postoperative nausea and vomiting)    Past Surgical History  Procedure Laterality Date  . Total vaginal hysterectomy  1996    endometriosis  . Abdominal hysterectomy    . Appendectomy    . Colonoscopy    . Dilation and curettage of uterus    . Breast lumpectomy with needle localization and axillary sentinel lymph node bx Right 07/21/2014    Procedure: BREAST LUMPECTOMY WITH NEEDLE LOCALIZATION AND AXILLARY SENTINEL LYMPH NODE BIOPSY;  Surgeon: Rolm Bookbinder, MD;  Location: [place];  Service: General;  Laterality: Right;  . Portacath placement Right 09/10/2014    Procedure: INSERTION PORT-A-CATH;  Surgeon: Rolm Bookbinder, MD;  Location: [place];  Service: General;  Laterality: Right;  . Port-a-cath removal  N/A 03/12/2015    Procedure: REMOVAL PORT-A-CATH;  Surgeon: Rolm Bookbinder, MD;  Location: [place];  Service: General;  Laterality: N/A;   Social History  Substance Use Topics  . Smoking status: Former Smoker -- 1.00 packs/day    Types: Cigarettes    Quit date: 06/25/2010  . Smokeless tobacco: None  . Alcohol Use: 0.0 oz/week    0 Standard drinks or equivalent per week     Comment: occasional wine   Family History  Problem Relation Age of Onset  . Breast cancer Sister 32  . Hypertension Mother   . Kidney disease Father   .  Diabetes Paternal Aunt    Allergies  Allergen Reactions  . Dust Mite Extract   . Pollen Extract    Current Outpatient Prescriptions on File Prior to Visit  Medication Sig Dispense Refill  . ALPRAZolam (XANAX) 0.5 MG tablet TAKE 1 TABLET BY MOUTH EVERY DAY AS NEEDED 30 tablet 1  . b complex vitamins tablet Take 1 tablet by mouth daily.     . Calcium Carbonate-Vitamin D (CALCIUM-VITAMIN D) 500-200 MG-UNIT per tablet Take 3 tablets by mouth every morning.      . cetirizine (ZYRTEC) 10 MG tablet Take 10 mg by mouth daily as needed for allergies.     . diphenhydrAMINE (BENADRYL) 25 MG tablet Take 25 mg by mouth daily as needed for allergies.     . fluticasone (FLONASE) 50 MCG/ACT nasal spray Place 2 sprays into both nostrils daily. 16 g 6  . Multiple Vitamin (MULTIVITAMIN) tablet Take 1 tablet by mouth daily.      . tamoxifen (NOLVADEX) 10 MG tablet Take 1 tablet (10 mg total) by mouth daily. 90 tablet 4   No current facility-administered medications on file prior to visit.    Review of Systems Review of Systems  Constitutional: Negative for fever, appetite change, fatigue and unexpected weight change.  Eyes: Negative for pain and visual disturbance.  Respiratory: Negative for cough and shortness of breath.   Cardiovascular: Negative for cp or palpitations    Gastrointestinal: Negative for nausea, diarrhea and constipation.    Genitourinary: Negative for urgency and frequency.  Skin: Negative for pallor or rash   Neurological: Negative for weakness, light-headedness, numbness and headaches.  Hematological: Negative for adenopathy. Does not bruise/bleed easily.  Psychiatric/Behavioral: Negative for dysphoric mood. The patient is not nervous/anxious.         Objective:   Physical Exam  Constitutional: She appears well-developed and well-nourished. No distress.  Well appearing   HENT:  Head: Normocephalic and atraumatic.  Right Ear: External ear normal.  Left Ear: External ear normal.  Mouth/Throat: Oropharynx is clear and moist.  Eyes: Conjunctivae and EOM are normal. Pupils are equal, round, and reactive to light. No scleral icterus.  Neck: Normal range of motion. Neck supple. No JVD present. Carotid bruit is not present. No thyromegaly present.  Cardiovascular: Normal rate, regular rhythm, normal heart sounds and intact distal pulses.  Exam reveals no gallop.   Pulmonary/Chest: Effort normal and breath sounds normal. No respiratory distress. She has no wheezes. She exhibits no tenderness.  Abdominal: Soft. Bowel sounds are normal. She exhibits no distension, no abdominal bruit and no mass. There is no tenderness.  Genitourinary: No breast swelling, tenderness, discharge or bleeding.  Post operative changes noted in R breast Breast exam: No mass, nodules, thickening, tenderness, bulging, retraction, inflamation, nipple discharge or skin changes noted.  No axillary or clavicular LA.      Musculoskeletal: Normal range of motion. She exhibits no edema or tenderness.  Lymphadenopathy:    She has no cervical adenopathy.  Neurological: She is alert. She has normal reflexes. No cranial nerve deficit. She exhibits normal muscle tone. Coordination normal.  Skin: Skin is warm and dry. No rash noted. No erythema. No pallor.  Some solar lentigo  Psychiatric: She has a normal mood and affect.          Assessment  & Plan:   Problem List Items Addressed This Visit      Cardiovascular and Mediastinum   Essential hypertension - Primary    bp in fair control  at this time  BP Readings from Last 1 Encounters:  04/28/15 112/74   No changes needed Disc lifstyle change with low sodium diet and exercise  Labs reviewed       Relevant Medications   atorvastatin (LIPITOR) 10 MG tablet   lisinopril-hydrochlorothiazide (PRINZIDE,ZESTORETIC) 20-25 MG tablet   metoprolol succinate (TOPROL-XL) 50 MG 24 hr tablet     Other   Anxiety    Takes xanax infrequently  Last dose 2 d ago Due for tox screen today- expect it to be neg       Breast cancer of lower-outer quadrant of right female breast [facility])    Doing well s/p surg/rad /chemo Continue oncol and surg f/u  Feels good! Tolerates tamoxifen well      Encounter for Medicare annual wellness exam    Reviewed health habits including diet and exercise and skin cancer prevention Reviewed appropriate screening tests for age  Also reviewed health mt list, fam hx and immunization status , as well as social and family history   See HPI Labs reviewed  Enc continued exercise prevnar today  Doing well s/p b cancer tx - for mm in Jan      HYPERCHOLESTEROLEMIA, PURE    Fairly good report Disc goals for lipids and reasons to control them Rev labs with pt Rev low sat fat diet in detail HDL not at goal- already exercises- she may try fish oil       Relevant Medications   atorvastatin (LIPITOR) 10 MG tablet   lisinopril-hydrochlorothiazide (PRINZIDE,ZESTORETIC) 20-25 MG tablet   metoprolol succinate (TOPROL-XL) 50 MG 24 hr tablet   Routine general medical examination at a health care facility    Reviewed health habits including diet and exercise and skin cancer prevention Reviewed appropriate screening tests for age  Also reviewed health mt list, fam hx and immunization status , as well as social and family history   See HPI Labs reviewed  Enc  continued exercise prevnar today  Due for mm in Jan - s/p breast ca and doing well         Other Visit Diagnoses    Need for vaccination with 13-polyvalent pneumococcal conjugate vaccine        Relevant Orders    Pneumococcal conjugate vaccine 13-valent (Completed)

## 2015-05-21 ENCOUNTER — Encounter: Payer: Self-pay | Admitting: Family Medicine

## 2015-07-22 ENCOUNTER — Other Ambulatory Visit: Payer: Self-pay | Admitting: *Deleted

## 2015-07-22 MED ORDER — METOPROLOL SUCCINATE ER 50 MG PO TB24: ORAL_TABLET | ORAL | Status: DC

## 2015-07-22 MED ORDER — LISINOPRIL-HYDROCHLOROTHIAZIDE 20-25 MG PO TABS: 1.0000 | ORAL_TABLET | Freq: Every day | ORAL | Status: DC

## 2015-07-22 NOTE — Telephone Encounter (Signed)
Received fax saying that pharmacy never received last refills, Rxs sent again

## 2015-07-27 ENCOUNTER — Encounter: Payer: Self-pay | Admitting: Family Medicine

## 2015-08-13 ENCOUNTER — Telehealth: Payer: Self-pay | Admitting: *Deleted

## 2015-08-13 NOTE — Telephone Encounter (Signed)
No entry 

## 2015-09-23 ENCOUNTER — Other Ambulatory Visit: Payer: Self-pay | Admitting: Family Medicine

## 2015-09-23 NOTE — Telephone Encounter (Signed)
Rx called in as prescribed 

## 2015-09-23 NOTE — Telephone Encounter (Signed)
Px written for call in   

## 2015-09-23 NOTE — Telephone Encounter (Signed)
Pt has CPE scheduled on 05/03/16, last refilled on 12/23/14 #30 with 1 additional refill, please advise

## 2015-10-14 ENCOUNTER — Other Ambulatory Visit: Payer: Self-pay | Admitting: *Deleted

## 2015-10-14 DIAGNOSIS — C50511 Malignant neoplasm of lower-outer quadrant of right female breast: Secondary | ICD-10-CM

## 2015-10-15 ENCOUNTER — Other Ambulatory Visit (HOSPITAL_BASED_OUTPATIENT_CLINIC_OR_DEPARTMENT_OTHER): Payer: Medicare Other

## 2015-10-15 DIAGNOSIS — C50511 Malignant neoplasm of lower-outer quadrant of right female breast: Secondary | ICD-10-CM | POA: Diagnosis not present

## 2015-10-15 LAB — COMPREHENSIVE METABOLIC PANEL
ALBUMIN: 3.9 g/dL (ref 3.5–5.0)
ALK PHOS: 52 U/L (ref 40–150)
ALT: 25 U/L (ref 0–55)
AST: 32 U/L (ref 5–34)
Anion Gap: 7 mEq/L (ref 3–11)
BUN: 18.3 mg/dL (ref 7.0–26.0)
CO2: 28 mEq/L (ref 22–29)
CREATININE: 0.9 mg/dL (ref 0.6–1.1)
Calcium: 9.9 mg/dL (ref 8.4–10.4)
Chloride: 106 mEq/L (ref 98–109)
EGFR: 68 mL/min/{1.73_m2} — ABNORMAL LOW (ref >90)
GLUCOSE: 88 mg/dL (ref 70–140)
POTASSIUM: 3.8 meq/L (ref 3.5–5.1)
SODIUM: 141 meq/L (ref 136–145)
Total Bilirubin: 0.48 mg/dL (ref 0.20–1.20)
Total Protein: 7 g/dL (ref 6.4–8.3)

## 2015-10-15 LAB — CBC WITH DIFFERENTIAL/PLATELET
BASO%: 1.4 % (ref 0.0–2.0)
BASOS ABS: 0.1 10*3/uL (ref 0.0–0.1)
EOS%: 3.3 % (ref 0.0–7.0)
Eosinophils Absolute: 0.2 10*3/uL (ref 0.0–0.5)
HCT: 36.8 % (ref 34.8–46.6)
HEMOGLOBIN: 12.7 g/dL (ref 11.6–15.9)
LYMPH#: 1.5 10*3/uL (ref 0.9–3.3)
LYMPH%: 27.7 % (ref 14.0–49.7)
MCH: 32.7 pg (ref 25.1–34.0)
MCHC: 34.4 g/dL (ref 31.5–36.0)
MCV: 95.1 fL (ref 79.5–101.0)
MONO#: 0.6 10*3/uL (ref 0.1–0.9)
MONO%: 10.9 % (ref 0.0–14.0)
NEUT%: 56.7 % (ref 38.4–76.8)
NEUTROS ABS: 3 10*3/uL (ref 1.5–6.5)
Platelets: 214 10*3/uL (ref 145–400)
RBC: 3.88 10*6/uL (ref 3.70–5.45)
RDW: 12.8 % (ref 11.2–14.5)
WBC: 5.3 10*3/uL (ref 3.9–10.3)

## 2015-10-22 ENCOUNTER — Telehealth: Payer: Self-pay | Admitting: Oncology

## 2015-10-22 ENCOUNTER — Ambulatory Visit (HOSPITAL_BASED_OUTPATIENT_CLINIC_OR_DEPARTMENT_OTHER): Payer: Medicare Other | Admitting: Oncology

## 2015-10-22 VITALS — BP 151/96 | HR 86 | Temp 98.3°F | Resp 18 | Ht 61.0 in | Wt 133.4 lb

## 2015-10-22 DIAGNOSIS — Z170 Estrogen receptor positive status [ER+]: Secondary | ICD-10-CM

## 2015-10-22 DIAGNOSIS — M8580 Other specified disorders of bone density and structure, unspecified site: Secondary | ICD-10-CM | POA: Diagnosis not present

## 2015-10-22 DIAGNOSIS — Z79810 Long term (current) use of selective estrogen receptor modulators (SERMs): Secondary | ICD-10-CM

## 2015-10-22 DIAGNOSIS — C50511 Malignant neoplasm of lower-outer quadrant of right female breast: Secondary | ICD-10-CM

## 2015-10-22 MED ORDER — GABAPENTIN 300 MG PO CAPS: 300.0000 mg | ORAL_CAPSULE | Freq: Every day | ORAL | Status: DC

## 2015-10-22 MED ORDER — TAMOXIFEN CITRATE 10 MG PO TABS: 10.0000 mg | ORAL_TABLET | Freq: Every day | ORAL | Status: DC

## 2015-10-22 NOTE — Progress Notes (Signed)
[place]  [phone] [phone]     ID: Jamie Dillon DOB: Jun 21, 1950  MR#: [id number]  RCV#:[id number]  Patient Care Team: Abner Greenspan, MD as PCP - General Rolm Bookbinder, MD as Consulting Physician (General Surgery) Chauncey Cruel, MD as Consulting Physician (Oncology) Eppie Gibson, MD as Attending Physician (Radiation Oncology) Rockwell Germany, RN as Registered Nurse Mauro Kaufmann, RN as Registered Nurse Holley Bouche, NP as Nurse Practitioner (Nurse Practitioner) Sylvan Cheese, NP as Nurse Practitioner (Nurse Practitioner) OTHER MD:  CHIEF COMPLAINT:  Estrogen receptor positive breast cancer  CURRENT TREATMENT: Tamoxifen   BREAST CANCER HISTORY: From the original intake note:  Jamie Dillon had routine screening mammography at [facility] 06/05/2014. The breast density was category B. There was a 6 mm irregular mass in the right breast. On 06/11/2014 the patient underwent right breast ultrasonography at [facility]. This confirmed a 7 mm all her than wide irregular mass in the right blast at the 8:00 position. 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  The patient's subsequent history is as detailed below  INTERVAL HISTORY: Jamie Dillon returns today for follow-up of her right-sided breast cancer. Jamie Dillon continues on tamoxifen, with night sweats as her only significant side effect. Jamie Dillon obtains it at a good price.   REVIEW OF SYSTEMS: Jamie Dillon is now working part-time again and enjoying it. Family is doing fine. Jamie Dillon exercises by walking in the [place] about 30 minutes with her husband are using her treadmill at home if the weather is bad. Jamie Dillon complains of stomach cramps at  times. Jamie Dillon was started on Gas-X and that seemed to help area there has not been any significant change in the bowel habits and particularly no significant diarrhea or constipation no blood in her stool or black or tarry stools. Jamie Dillon is a little anxious but not depressed. A detailed review of systems was otherwise stable  PAST MEDICAL HISTORY: Past Medical History  Diagnosis Date  . Allergic rhinitis   . HTN (hypertension)   . HLD (hyperlipidemia)   . Tobacco abuse   . Family history of malignant neoplasm of breast   . Family history of other kidney diseases   . Disorder of bone and cartilage, unspecified   . Breast cancer of lower-outer quadrant of right female breast ([place]) 06/19/2014  . Anxiety   . PONV (postoperative nausea and vomiting)     PAST SURGICAL HISTORY: Past Surgical History  Procedure Laterality Date  . Total vaginal hysterectomy  1996    endometriosis  . Abdominal hysterectomy    . Appendectomy    . Colonoscopy    . Dilation and curettage of uterus    . Breast lumpectomy with needle localization and axillary sentinel lymph node bx Right 07/21/2014    Procedure: BREAST LUMPECTOMY WITH NEEDLE LOCALIZATION AND AXILLARY SENTINEL LYMPH NODE BIOPSY;  Surgeon: Rolm Bookbinder, MD;  Location: [place];  Service: General;  Laterality: Right;  . Portacath placement Right 09/10/2014    Procedure: INSERTION PORT-A-CATH;  Surgeon: Rolm Bookbinder, MD;  Location: [place];  Service: General;  Laterality: Right;  . Port-a-cath removal N/A 03/12/2015    Procedure: REMOVAL PORT-A-CATH;  Surgeon: Rolm Bookbinder, MD;  Location: [place];  Service: General;  Laterality: N/A;    FAMILY HISTORY Family History  Problem Relation Age of Onset  . Breast cancer Sister 50  . Hypertension Mother   . Kidney disease Father   . Diabetes Paternal Aunt    the patient's father died from kidney failure at the age of 52. The patient's mother is still living at age  74. The patient had no brothers, one sister. That sister was diagnosed with breast cancer at age 34. The patient's father's mother was diagnosed with mouth cancer at the age of 14.  GYNECOLOGIC HISTORY:  No LMP recorded. Patient has had a hysterectomy. Menarche age 55, first live birth age 67. The patient is GX P2. Jamie Dillon had a total abdominal hysterectomy with bilateral salpingo-oophorectomy in 1996. Jamie Dillon took hormone replacement for approximately 10 years, until 2009. Jamie Dillon also took oral contraceptives for approximately 7 years remotely, with no complications  SOCIAL HISTORY:  The patient is currently retired, though Jamie Dillon still works part-time at the [employer] Her husband "[name]" Harmani Neto is retired from Press photographer. Son [name] "[name]" Delay teaches English at [employer] in [place]. Daughter [name] lives in [place] . 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 Smoking status: Former Smoker -- 1.00 packs/day    Types: Cigarettes    Quit date: 06/25/2010  . Smokeless tobacco: Not on file  . Alcohol Use: 0.0 oz/week    0 Standard drinks or equivalent per week     Comment: occasional wine     Colonoscopy: 2010  PAP:  Bone density: December 2015  Lipid panel:  Allergies  Allergen Reactions  . Dust Mite Extract   . Pollen Extract     Current Outpatient Prescriptions  Medication Sig Dispense Refill  . ALPRAZolam (XANAX) 0.5 MG tablet TAKE 1 TABLET EVERY DAY AS NEEDED 30 tablet 0  . atorvastatin (LIPITOR) 10 MG tablet Take 1 tablet (10 mg total) by mouth daily. 90 tablet 3  . b complex vitamins tablet Take 1 tablet by mouth daily.     . Calcium Carbonate-Vitamin D (CALCIUM-VITAMIN D) 500-200 MG-UNIT per tablet Take 3 tablets by mouth every morning.      . cetirizine (ZYRTEC) 10 MG tablet Take 10 mg by mouth daily as needed for allergies.       . diphenhydrAMINE (BENADRYL) 25 MG tablet Take 25 mg by mouth daily as needed for allergies.     . fluticasone (FLONASE) 50 MCG/ACT nasal spray Place 2 sprays into both nostrils daily. 16 g 6  . lisinopril-hydrochlorothiazide (PRINZIDE,ZESTORETIC) 20-25 MG tablet Take 1 tablet by mouth daily. 90 tablet 2  . metoprolol succinate (TOPROL-XL) 50 MG 24 hr tablet TAKE 1 TABLET EVERY DAY TAKE WITH OR IMMEDIATELY FOLLOWING A MEAL 90 tablet 2  . Multiple Vitamin (MULTIVITAMIN) tablet Take 1 tablet by mouth daily.      . tamoxifen (NOLVADEX) 10 MG tablet Take 1 tablet (10 mg total) by mouth daily. 90 tablet 4   No current facility-administered medications for this visit.    OBJECTIVE: Middle-aged white woman In no acute distress  Filed Vitals:   10/22/15 1359  BP: 151/96  Pulse: 86  Temp: 98.3 F (36.8 C)  Resp: 18     Body mass index is 25.22 kg/(m^2).    ECOG FS:0 - Asymptomatic  Sclerae unicteric, EOMs intact Oropharynx clear, dentition in good repair No  cervical or supraclavicular adenopathy Lungs no rales or rhonchi Heart regular rate and rhythm Abd soft, nontender, positive bowel sounds, no masses palpated MSK no focal spinal tenderness, no upper extremity lymphedema Neuro: nonfocal, well oriented, appropriate affect Breasts: The right breast is status post lumpectomy and radiation. There is no evidence of local recurrence. The right axilla is benign. Left breast is unremarkable  LAB RESULTS:  CMP     Component Value Date/Time   NA 141 10/15/2015 1133   NA 142 04/20/2015 0819   K 3.8 10/15/2015 1133   K 3.9 04/20/2015 0819   CL 106 04/20/2015 0819   CO2 28 10/15/2015 1133   CO2 27 04/20/2015 0819   GLUCOSE 88 10/15/2015 1133   GLUCOSE 93 04/20/2015 0819   BUN 18.3 10/15/2015 1133   BUN 17 04/20/2015 0819   CREATININE 0.9 10/15/2015 1133   CREATININE 0.87 04/20/2015 0819   CALCIUM 9.9 10/15/2015 1133   CALCIUM 9.3 04/20/2015 0819   PROT 7.0 10/15/2015 1133   PROT 6.8  04/20/2015 0819   ALBUMIN 3.9 10/15/2015 1133   ALBUMIN 3.9 04/20/2015 0819   AST 32 10/15/2015 1133   AST 20 04/20/2015 0819   ALT 25 10/15/2015 1133   ALT 17 04/20/2015 0819   ALKPHOS 52 10/15/2015 1133   ALKPHOS 47 04/20/2015 0819   BILITOT 0.48 10/15/2015 1133   BILITOT 0.6 04/20/2015 0819   GFRNONAA >60 03/09/2015 0847   GFRAA >60 03/09/2015 0847    INo results found for: SPEP, UPEP  Lab Results  Component Value Date   WBC 5.3 10/15/2015   NEUTROABS 3.0 10/15/2015   HGB 12.7 10/15/2015   HCT 36.8 10/15/2015   MCV 95.1 10/15/2015   PLT 214 10/15/2015      Chemistry      Component Value Date/Time   NA 141 10/15/2015 1133   NA 142 04/20/2015 0819   K 3.8 10/15/2015 1133   K 3.9 04/20/2015 0819   CL 106 04/20/2015 0819   CO2 28 10/15/2015 1133   CO2 27 04/20/2015 0819   BUN 18.3 10/15/2015 1133   BUN 17 04/20/2015 0819   CREATININE 0.9 10/15/2015 1133   CREATININE 0.87 04/20/2015 0819      Component Value Date/Time   CALCIUM 9.9 10/15/2015 1133   CALCIUM 9.3 04/20/2015 0819   ALKPHOS 52 10/15/2015 1133   ALKPHOS 47 04/20/2015 0819   AST 32 10/15/2015 1133   AST 20 04/20/2015 0819   ALT 25 10/15/2015 1133   ALT 17 04/20/2015 0819   BILITOT 0.48 10/15/2015 1133   BILITOT 0.6 04/20/2015 0819       No results found for: LABCA2  No components found for: LABCA125  No results for input(s): INR in the last 168 hours.  Urinalysis    Component Value Date/Time   COLORURINE YELLOW 09/21/2014 1809   APPEARANCEUR CLOUDY* 09/21/2014 1809   LABSPEC 1.009 09/21/2014 1809   PHURINE 7.0 09/21/2014 1809   GLUCOSEU NEGATIVE 09/21/2014 1809   HGBUR MODERATE* 09/21/2014 1809   HGBUR negative 02/13/2008 0911   BILIRUBINUR Neg. 10/15/2014 1248   BILIRUBINUR NEGATIVE 09/21/2014 1809   KETONESUR NEGATIVE 09/21/2014 1809   PROTEINUR Neg. 10/15/2014 1248   PROTEINUR 30* 09/21/2014 1809   UROBILINOGEN 0.2 10/15/2014 1248   UROBILINOGEN 0.2 09/21/2014 1809   NITRITE  Neg. 10/15/2014 1248   NITRITE POSITIVE* 09/21/2014 1809   LEUKOCYTESUR Negative 10/15/2014 1248    STUDIES: Mammography March 2017 at [facility] was unremarkable  ASSESSMENT: 65 y.o. [employer]  woman status post right breast biopsy 06/17/2014 for a clinical T1b N0, stage I invasive ductal carcinoma, grade 2, estrogen receptor strongly positive, progesterone receptor moderately positive, with no HER-2 amplification and an MIB-1 of 22%.  (1) status post right lumpectomy and right axillary sentinel lymph node sampling 07/21/2014 for a pT1b pN1a, stage IIA invasive ductal carcinoma, grade 1, with negative margins, repeat HER-2/neu again negative.  (2) adjuvant chemotherapy consisting of cyclophosphamide and docetaxel started 09/15/2014, given every 3 weeks with Neulasta support, completed 11/18/2014  (a) Neutropenic fever and hospitalization 5 days after the first cycle: subsequent cycles 10% dose reduced  (3) adjuvant radiation 12/10/2014-01/21/2015: Right breast/ axilla // 50 Gy in 25 fractions Right breast boost / 10 Gy in 5 fractions  (4) started tamoxifen 02/14/2015  (5) osteopenia: bone density January 2016 shows a T score of -1.1 at the femoral neck  PLAN: Jamie Dillon is now a little over a year out from definitive surgery for her breast cancer, with no evidence of disease recurrence. This is very favorable.  The plan is to continue tamoxifen likely for 5 years and then stop. Jamie Dillon is tolerating it well except for the nighttime hot flashes.  For that we discussed gabapentin today. Jamie Dillon has a good understanding of the possible toxicities, side effects and complications of this agent, which Jamie Dillon will take only at bedtime. Jamie Dillon will let us know if it does not work for her to do generally is very effective for this indication.  The lower  Abdominal cramps that Jamie Dillon gets are not going to be related to the tamoxifen. They're more likely to be related to her diverticular disease. There has not been sharp  pain or fever but I alerted her regarding that possibility. In the next 1 occurs I asked her to go back 24 hours and write down everything Jamie Dillon ate. Possibly after a few times we can figure out what it is in her diet that is provoking this.  Otherwise Jamie Dillon will return to see me in October. Jamie Dillon will see Dr. Donne Hazel again next April We will continue to "tag team her" in that fashion until Jamie Dillon completes her 5 years of treatment  Jamie Dillon knows to call for any problems that may develop before her next visit Chauncey Cruel, MD   10/22/2015 2:07 PM

## 2015-10-22 NOTE — Telephone Encounter (Signed)
appt made and avs printed °

## 2015-12-01 ENCOUNTER — Encounter: Payer: Self-pay | Admitting: Internal Medicine

## 2015-12-01 ENCOUNTER — Ambulatory Visit (INDEPENDENT_AMBULATORY_CARE_PROVIDER_SITE_OTHER): Payer: Medicare Other | Admitting: Internal Medicine

## 2015-12-01 VITALS — BP 134/82 | HR 88 | Temp 98.2°F | Wt 131.0 lb

## 2015-12-01 DIAGNOSIS — B349 Viral infection, unspecified: Secondary | ICD-10-CM

## 2015-12-01 DIAGNOSIS — J329 Chronic sinusitis, unspecified: Secondary | ICD-10-CM

## 2015-12-01 DIAGNOSIS — B9789 Other viral agents as the cause of diseases classified elsewhere: Secondary | ICD-10-CM

## 2015-12-01 DIAGNOSIS — J309 Allergic rhinitis, unspecified: Secondary | ICD-10-CM

## 2015-12-01 NOTE — Progress Notes (Signed)
Pre visit review using our clinic review tool, if applicable. No additional management support is needed unless otherwise documented below in the visit note. 

## 2015-12-01 NOTE — Progress Notes (Signed)
Subjective:    Patient ID: Jamie Dillon, female    DOB: 16-Jan-1951, 65 y.o.   MRN: [id number]  HPI  Pt presents to the clinic today with complaint of sinus pressure and right ear fullness. Her symptoms began 3 weeks ago, but have become worse over the last 2-3 days. She reports she lost her voice when symptoms started.  She reports cough, drainage down the back of her throat, decreased energy, and occasional red and itchy eyes.  She reports daily headaches described as a tightening at a severity of 7/10, but denies nausea, vomiting, photophobia, phonophobia, or aura.  She has tried Ibuprofen with relief.  She denies ear drainage, hearing loss, sputum production, or chest pain.  She has tried Zyrtec, Flonase, Benadryl, and an OTC cough suppressant with some relief.  She reports her daughter was sick with similar symptoms 4 weeks ago before her symptoms began.  Review of Systems   Past Medical History  Diagnosis Date  . Allergic rhinitis   . HTN (hypertension)   . HLD (hyperlipidemia)   . Tobacco abuse   . Family history of malignant neoplasm of breast   . Family history of other kidney diseases   . Disorder of bone and cartilage, unspecified   . Breast cancer of lower-outer quadrant of right female breast ([place]) 06/19/2014  . Anxiety   . PONV (postoperative nausea and vomiting)     Current Outpatient Prescriptions  Medication Sig Dispense Refill  . ALPRAZolam (XANAX) 0.5 MG tablet TAKE 1 TABLET EVERY DAY AS NEEDED 30 tablet 0  . atorvastatin (LIPITOR) 10 MG tablet Take 1 tablet (10 mg total) by mouth daily. 90 tablet 3  . b complex vitamins tablet Take 1 tablet by mouth daily.     . Calcium Carbonate-Vitamin D (CALCIUM-VITAMIN D) 500-200 MG-UNIT per tablet Take 3 tablets by mouth every morning.      . diphenhydrAMINE (BENADRYL) 25 MG tablet Take 25 mg by mouth daily as needed for allergies.     Marland Kitchen gabapentin (NEURONTIN) 300 MG capsule Take 1 capsule (300 mg total) by mouth at bedtime. 90  capsule 4  . lisinopril-hydrochlorothiazide (PRINZIDE,ZESTORETIC) 20-25 MG tablet Take 1 tablet by mouth daily. 90 tablet 2  . metoprolol succinate (TOPROL-XL) 50 MG 24 hr tablet TAKE 1 TABLET EVERY DAY TAKE WITH OR IMMEDIATELY FOLLOWING A MEAL 90 tablet 2  . Multiple Vitamin (MULTIVITAMIN) tablet Take 1 tablet by mouth daily.      . tamoxifen (NOLVADEX) 10 MG tablet Take 1 tablet (10 mg total) by mouth daily. 90 tablet 4   No current facility-administered medications for this visit.    Allergies  Allergen Reactions  . Dust Mite Extract   . Pollen Extract     Family History  Problem Relation Age of Onset  . Breast cancer Sister 32  . Hypertension Mother   . Kidney disease Father   . Diabetes Paternal Aunt     Social History   Social History  . Marital Status: Married    Spouse Name: N/A  . Number of Children: 2  . Years of Education: N/A   Occupational History  . Not on file.   Social History Main Topics  . Smoking status: Former Smoker -- 1.00 packs/day    Types: Cigarettes    Quit date: 06/25/2010  . Smokeless tobacco: Not on file  . Alcohol Use: 0.0 oz/week    0 Standard drinks or equivalent per week     Comment: occasional wine  .  Drug Use: No  . Sexual Activity: Not on file   Other Topics Concern  . Not on file   Social History Narrative   Gym and walking for exercise      Cares for elderly mother and sister      married     Const: Reports headache. HEENT: Reports ear fullness, nasal congestion, occasional red and itchy eyes.  Denies hearing loss, ear drainage, or sore throat. Pulm: Reports cough.  Denies sputum production. CV: Denies chest pain.   No other specific complaints in a complete review of systems (except as listed in HPI above).     Objective:   Physical Exam  BP 134/82 mmHg  Pulse 88  Temp(Src) 98.2 F (36.8 C) (Oral)  Wt 131 lb (59.421 kg)  SpO2 98%  General:  Well-appearing, appears stated age, in no acute  distress. HEENT:  No scleral icterus or conjunctival injection.  TMs pearly grey, cerumen present left ear, no erythema.  No erythema or discharge of nose.  No exudates or erythema of pharynx.  Frontal sinuses tender to palpation.   Neck: No lymphadenopathy present. Pulm: Clear to auscultation bilaterally.  No wheezes, rales, or rhonchi. CV: Regular rate and rhythm. No murmurs, rubs, or gallops.      Assessment & Plan:   Viral Sinusitis/Allergic Rhinitis:  80mg  Depo Medrol IM given today Continue Zyrtec and Flonase daily  Call if symptoms worsen or do not resolve

## 2015-12-01 NOTE — Patient Instructions (Signed)
Allergic Rhinitis Allergic rhinitis is when the mucous membranes in the nose respond to allergens. Allergens are particles in the air that cause your body to have an allergic reaction. This causes you to release allergic antibodies. Through a chain of events, these eventually cause you to release histamine into the blood stream. Although meant to protect the body, it is this release of histamine that causes your discomfort, such as frequent sneezing, congestion, and an itchy, runny nose.  CAUSES Seasonal allergic rhinitis (hay fever) is caused by pollen allergens that may come from grasses, trees, and weeds. Year-round allergic rhinitis (perennial allergic rhinitis) is caused by allergens such as house dust mites, pet dander, and mold spores. SYMPTOMS  Nasal stuffiness (congestion).  Itchy, runny nose with sneezing and tearing of the eyes. DIAGNOSIS Your health care provider can help you determine the allergen or allergens that trigger your symptoms. If you and your health care provider are unable to determine the allergen, skin or blood testing may be used. Your health care provider will diagnose your condition after taking your health history and performing a physical exam. Your health care provider may assess you for other related conditions, such as asthma, pink eye, or an ear infection. TREATMENT Allergic rhinitis does not have a cure, but it can be controlled by:  Medicines that block allergy symptoms. These may include allergy shots, nasal sprays, and oral antihistamines.  Avoiding the allergen. Hay fever may often be treated with antihistamines in pill or nasal spray forms. Antihistamines block the effects of histamine. There are over-the-counter medicines that may help with nasal congestion and swelling around the eyes. Check with your health care provider before taking or giving this medicine. If avoiding the allergen or the medicine prescribed do not work, there are many new medicines  your health care provider can prescribe. Stronger medicine may be used if initial measures are ineffective. Desensitizing injections can be used if medicine and avoidance does not work. Desensitization is when a patient is given ongoing shots until the body becomes less sensitive to the allergen. Make sure you follow up with your health care provider if problems continue. HOME CARE INSTRUCTIONS It is not possible to completely avoid allergens, but you can reduce your symptoms by taking steps to limit your exposure to them. It helps to know exactly what you are allergic to so that you can avoid your specific triggers. SEEK MEDICAL CARE IF:  You have a fever.  You develop a cough that does not stop easily (persistent).  You have shortness of breath.  You start wheezing.  Symptoms interfere with normal daily activities.   This information is not intended to replace advice given to you by your health care provider. Make sure you discuss any questions you have with your health care provider.   Document Released: 01/25/2001 Document Revised: 05/23/2014 Document Reviewed: 01/07/2013 Elsevier Interactive Patient Education 2016 Elsevier Inc.  

## 2015-12-07 MED ORDER — METHYLPREDNISOLONE ACETATE 80 MG/ML IJ SUSP
80.0000 mg | Freq: Once | INTRAMUSCULAR | Status: AC
  Administered 2015-12-01: 80 mg via INTRAMUSCULAR

## 2015-12-07 NOTE — Addendum Note (Signed)
Addended by: Lurlean Nanny on: 12/07/2015 09:32 AM   Modules accepted: Orders

## 2016-01-16 ENCOUNTER — Other Ambulatory Visit: Payer: Self-pay | Admitting: Family Medicine

## 2016-02-15 ENCOUNTER — Other Ambulatory Visit: Payer: Self-pay | Admitting: *Deleted

## 2016-02-15 DIAGNOSIS — C50511 Malignant neoplasm of lower-outer quadrant of right female breast: Secondary | ICD-10-CM

## 2016-02-16 ENCOUNTER — Other Ambulatory Visit (HOSPITAL_BASED_OUTPATIENT_CLINIC_OR_DEPARTMENT_OTHER): Payer: Medicare Other

## 2016-02-16 DIAGNOSIS — C50511 Malignant neoplasm of lower-outer quadrant of right female breast: Secondary | ICD-10-CM

## 2016-02-16 LAB — CBC WITH DIFFERENTIAL/PLATELET
BASO%: 1 % (ref 0.0–2.0)
BASOS ABS: 0.1 10*3/uL (ref 0.0–0.1)
EOS ABS: 0.2 10*3/uL (ref 0.0–0.5)
EOS%: 2.4 % (ref 0.0–7.0)
HCT: 36.7 % (ref 34.8–46.6)
HGB: 12.6 g/dL (ref 11.6–15.9)
LYMPH%: 26.7 % (ref 14.0–49.7)
MCH: 33.1 pg (ref 25.1–34.0)
MCHC: 34.3 g/dL (ref 31.5–36.0)
MCV: 96.3 fL (ref 79.5–101.0)
MONO#: 0.7 10*3/uL (ref 0.1–0.9)
MONO%: 9.2 % (ref 0.0–14.0)
NEUT%: 60.7 % (ref 38.4–76.8)
NEUTROS ABS: 4.3 10*3/uL (ref 1.5–6.5)
PLATELETS: 215 10*3/uL (ref 145–400)
RBC: 3.81 10*6/uL (ref 3.70–5.45)
RDW: 13.1 % (ref 11.2–14.5)
WBC: 7.1 10*3/uL (ref 3.9–10.3)
lymph#: 1.9 10*3/uL (ref 0.9–3.3)

## 2016-02-16 LAB — COMPREHENSIVE METABOLIC PANEL
ALK PHOS: 54 U/L (ref 40–150)
ALT: 27 U/L (ref 0–55)
ANION GAP: 11 meq/L (ref 3–11)
AST: 26 U/L (ref 5–34)
Albumin: 3.7 g/dL (ref 3.5–5.0)
BILIRUBIN TOTAL: 0.44 mg/dL (ref 0.20–1.20)
BUN: 17.1 mg/dL (ref 7.0–26.0)
CO2: 25 meq/L (ref 22–29)
Calcium: 10.1 mg/dL (ref 8.4–10.4)
Chloride: 106 mEq/L (ref 98–109)
Creatinine: 0.9 mg/dL (ref 0.6–1.1)
EGFR: 66 mL/min/{1.73_m2} — AB (ref >90)
Glucose: 84 mg/dl (ref 70–140)
POTASSIUM: 4.1 meq/L (ref 3.5–5.1)
Sodium: 143 mEq/L (ref 136–145)
TOTAL PROTEIN: 7.1 g/dL (ref 6.4–8.3)

## 2016-02-23 ENCOUNTER — Ambulatory Visit (HOSPITAL_BASED_OUTPATIENT_CLINIC_OR_DEPARTMENT_OTHER): Payer: Medicare Other | Admitting: Oncology

## 2016-02-23 VITALS — BP 120/68 | HR 81 | Temp 98.3°F | Resp 18 | Ht 61.0 in | Wt 135.6 lb

## 2016-02-23 DIAGNOSIS — C50511 Malignant neoplasm of lower-outer quadrant of right female breast: Secondary | ICD-10-CM

## 2016-02-23 DIAGNOSIS — Z79810 Long term (current) use of selective estrogen receptor modulators (SERMs): Secondary | ICD-10-CM

## 2016-02-23 DIAGNOSIS — Z170 Estrogen receptor positive status [ER+]: Secondary | ICD-10-CM | POA: Diagnosis not present

## 2016-02-23 NOTE — Progress Notes (Signed)
[place]  [phone] Fax:([phone]) [phone]     ID: Jamie Dillon DOB: [birth year]  MR#: [id number]  JKK#:[id number]  Patient Care Team: Abner Greenspan, MD as PCP - General Rolm Bookbinder, MD as Consulting Physician (General Surgery) Chauncey Cruel, MD as Consulting Physician (Oncology) Eppie Gibson, MD as Attending Physician (Radiation Oncology) Rockwell Germany, RN as Registered Nurse Mauro Kaufmann, RN as Registered Nurse Holley Bouche, NP as Nurse Practitioner (Nurse Practitioner) Sylvan Cheese, NP as Nurse Practitioner (Nurse Practitioner) OTHER MD:  CHIEF COMPLAINT:  Estrogen receptor positive breast cancer  CURRENT TREATMENT: Tamoxifen   BREAST CANCER HISTORY: From the original intake note:  Jamie Dillon had routine screening mammography at [facility] 06/05/2014. The breast density was category B. There was a 6 mm irregular mass in the right breast. On 06/11/2014 the patient underwent right breast ultrasonography at [facility]. This confirmed a 7 mm all her than wide irregular mass in the right blast at the 8:00 position. 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  The patient's subsequent history is as detailed below  INTERVAL HISTORY: Jamie Dillon returns today for follow-up of her estrogen receptor positive breast cancer. She is tolerating tamoxifen much better. Hot flashes have waned. She does not have a vaginal discharge. She was having a little bit of stomach discomfort but now she takes it with food and that problem went away. She obtains it about 7 or $8 per month  REVIEW OF SYSTEMS: She gets on the treadmill about 30 minutes or 5 days a week. She does of course all  her other usual activities of daily living. A detailed review of systems today was benign  PAST MEDICAL HISTORY: Past Medical History:  Diagnosis Date  . Allergic rhinitis   . Anxiety   . Breast cancer of lower-outer quadrant of right female breast ([place]) 06/19/2014  . Disorder of bone and cartilage, unspecified   . Family history of malignant neoplasm of breast   . Family history of other kidney diseases   . HLD (hyperlipidemia)   . HTN (hypertension)   . PONV (postoperative nausea and vomiting)   . Tobacco abuse     PAST SURGICAL HISTORY: Past Surgical History:  Procedure Laterality Date  . ABDOMINAL HYSTERECTOMY    . APPENDECTOMY    . BREAST LUMPECTOMY WITH NEEDLE LOCALIZATION AND AXILLARY SENTINEL LYMPH NODE BX Right 07/21/2014   Procedure: BREAST LUMPECTOMY WITH NEEDLE LOCALIZATION AND AXILLARY SENTINEL LYMPH NODE BIOPSY;  Surgeon: Rolm Bookbinder, MD;  Location: [place];  Service: General;  Laterality: Right;  . COLONOSCOPY    . DILATION AND CURETTAGE OF UTERUS    . PORT-A-CATH REMOVAL N/A 03/12/2015   Procedure: REMOVAL PORT-A-CATH;  Surgeon: Rolm Bookbinder, MD;  Location: [place];  Service: General;  Laterality: N/A;  . PORTACATH PLACEMENT Right 09/10/2014   Procedure: INSERTION PORT-A-CATH;  Surgeon: Rolm Bookbinder, MD;  Location: [place];  Service: General;  Laterality: Right;  . TOTAL VAGINAL HYSTERECTOMY  1996   endometriosis    FAMILY HISTORY Family History  Problem Relation Age of Onset  . Breast cancer Sister 3  . Hypertension Mother   . Kidney disease Father   . Diabetes Paternal Aunt    the  patient's father died from kidney failure at the age of 40. The patient's mother is still living at age 71. The patient had no brothers, one sister. That sister was diagnosed with breast cancer at age 29. The patient's father's mother was diagnosed with mouth cancer at the age of 87.  GYNECOLOGIC HISTORY:  No LMP recorded. Patient  has had a hysterectomy. Menarche age 22, first live birth age 28. The patient is GX P2. She had a total abdominal hysterectomy with bilateral salpingo-oophorectomy in 1996. She took hormone replacement for approximately 10 years, until 2009. She also took oral contraceptives for approximately 7 years remotely, with no complications  SOCIAL HISTORY:  The patient is currently retired, though she still works part-time at the [employer] Her husband "[name]" Steffanie Mingle is retired from Press photographer. Son [name] "[name]" Quraishi teaches English at [employer] in [place]. Daughter [name] lives in [place] . She works as a Marine scientist at the [employer]: In [place]: Social History  Substance Use Topics  . Smoking status: Former Smoker    Packs/day: 1.00    Types: Cigarettes    Quit date: 06/25/2010  . Smokeless tobacco: Not on file  . Alcohol use 0.0 oz/week     Comment: occasional wine     Colonoscopy: 2010  PAP:  Bone density: December 2015  Lipid panel:  Allergies  Allergen Reactions  . Dust Mite Extract   . Pollen Extract     Current Outpatient Prescriptions  Medication Sig Dispense Refill  . ALPRAZolam (XANAX) 0.5 MG tablet TAKE 1 TABLET EVERY DAY AS NEEDED 30 tablet 0  . atorvastatin (LIPITOR) 10 MG tablet Take 1 tablet (10 mg total) by mouth daily. 90 tablet 3  . b complex vitamins tablet Take 1 tablet by mouth daily.     . Calcium Carbonate-Vitamin D (CALCIUM-VITAMIN D) 500-200 MG-UNIT per tablet Take 3 tablets by mouth every morning.      . diphenhydrAMINE (BENADRYL) 25 MG tablet Take 25 mg by mouth daily as needed for allergies.     Marland Kitchen gabapentin (NEURONTIN) 300 MG capsule Take 1 capsule (300 mg total) by mouth at bedtime. 90 capsule 4  . lisinopril-hydrochlorothiazide (PRINZIDE,ZESTORETIC) 20-25 MG tablet Take 1 tablet by mouth daily. 90 tablet 2  . metoprolol succinate (TOPROL-XL) 50 MG 24 hr  tablet TAKE 1 TABLET EVERY DAY WITH OR IMMEDIATELY FOLLOWING A MEAL 90 tablet 1  . Multiple Vitamin (MULTIVITAMIN) tablet Take 1 tablet by mouth daily.      . tamoxifen (NOLVADEX) 10 MG tablet Take 1 tablet (10 mg total) by mouth daily. 90 tablet 4   No current facility-administered medications for this visit.     OBJECTIVE: Middle-aged white woman who appears well  Vitals:   02/23/16 1357  BP: 120/68  Pulse: 81  Resp: 18  Temp: 98.3 F (36.8 Dillon)     Body mass index is 25.62 kg/m.    ECOG FS:0 - Asymptomatic  Sclerae unicteric, pupils round and equal Oropharynx clear and moist-- no thrush or other lesions No cervical or supraclavicular adenopathy Lungs no rales or rhonchi Heart regular rate and rhythm Abd soft, nontender, positive bowel sounds MSK no focal spinal tenderness, no upper extremity lymphedema Neuro: nonfocal, well oriented, appropriate affect Breasts: The right breast is status post lumpectomy followed by radiation. There is no evidence of local recurrence. The right axilla is benign. Left breast is unremarkable. He is only  she usually only her he    LAB RESULTS:  CMP     Component Value Date/Time   NA 143 02/16/2016 1056   K 4.1 02/16/2016 1056   CL 106 04/20/2015 0819   CO2 25 02/16/2016 1056   GLUCOSE 84 02/16/2016 1056   BUN 17.1 02/16/2016 1056   CREATININE 0.9 02/16/2016 1056   CALCIUM 10.1 02/16/2016 1056   PROT 7.1 02/16/2016 1056   ALBUMIN 3.7 02/16/2016 1056   AST 26 02/16/2016 1056   ALT 27 02/16/2016 1056   ALKPHOS 54 02/16/2016 1056   BILITOT 0.44 02/16/2016 1056   GFRNONAA >60 03/09/2015 0847   GFRAA >60 03/09/2015 0847    INo results found for: SPEP, UPEP  Lab Results  Component Value Date   WBC 7.1 02/16/2016   NEUTROABS 4.3 02/16/2016   HGB 12.6 02/16/2016   HCT 36.7 02/16/2016   MCV 96.3 02/16/2016   PLT 215 02/16/2016      Chemistry      Component Value Date/Time   NA 143 02/16/2016 1056   K 4.1 02/16/2016 1056   CL  106 04/20/2015 0819   CO2 25 02/16/2016 1056   BUN 17.1 02/16/2016 1056   CREATININE 0.9 02/16/2016 1056      Component Value Date/Time   CALCIUM 10.1 02/16/2016 1056   ALKPHOS 54 02/16/2016 1056   AST 26 02/16/2016 1056   ALT 27 02/16/2016 1056   BILITOT 0.44 02/16/2016 1056       No results found for: LABCA2  No components found for: LABCA125  No results for input(s): INR in the last 168 hours.  Urinalysis    Component Value Date/Time   COLORURINE YELLOW 09/21/2014 1809   APPEARANCEUR CLOUDY (A) 09/21/2014 1809   LABSPEC 1.009 09/21/2014 1809   PHURINE 7.0 09/21/2014 1809   GLUCOSEU NEGATIVE 09/21/2014 1809   HGBUR MODERATE (A) 09/21/2014 1809   HGBUR negative 02/13/2008 0911   BILIRUBINUR Neg. 10/15/2014 1248   KETONESUR NEGATIVE 09/21/2014 1809   PROTEINUR Neg. 10/15/2014 1248   PROTEINUR 30 (A) 09/21/2014 1809   UROBILINOGEN 0.2 10/15/2014 1248   UROBILINOGEN 0.2 09/21/2014 1809   NITRITE Neg. 10/15/2014 1248   NITRITE POSITIVE (A) 09/21/2014 1809   LEUKOCYTESUR Negative 10/15/2014 1248    STUDIES: Mammography March 2017 at [facility] was unremarkable  ASSESSMENT: 65 y.o. [place] woman status post right breastLower outer quadrant biopsy 06/17/2014 for a clinical T1b N0, stage I invasive ductal carcinoma, grade 2, estrogen receptor strongly positive, progesterone receptor moderately positive, with no HER-2 amplification and an MIB-1 of 22%.  (1) status post right lumpectomy and right axillary sentinel lymph node sampling 07/21/2014 for a pT1b pN1a, stage IIA invasive ductal carcinoma, grade 1, with negative margins, repeat HER-2/neu again negative.  (2) adjuvant chemotherapy consisting of cyclophosphamide and docetaxel started 09/15/2014, given every 3 weeks with Neulasta support, completed 11/18/2014  (a) Neutropenic fever and hospitalization 5 days after the first cycle: subsequent cycles 10% dose reduced  (3) adjuvant radiation 12/10/2014-01/21/2015: Right  breast/ axilla // 50 Gy in 25 fractions Right breast boost / 10 Gy in 5 fractions  (4) started tamoxifen 02/14/2015  (5) osteopenia: bone density January 2016 shows a T score of -1.1 at the femoral neck  PLAN: Jamie Dillon is now a year and a half out from definitive surgery for her breast cancer, with no evidence of disease recurrence. This is favorable area  She is tolerating tamoxifen well, with practically no side effects. She should have no trouble continuing  this medication for a total of 5 years.  She will have mammography in March and she will see Dr. Donne Hazel in April accordingly she will return to see me a year from now October 2018.  I reassured her that the discomfort she sometimes feels in the surgical breast is not related to breast cancer but due to its treatment and certainly does not indicate disease recurrence.  She knows to call for any problems that may develop before her next visit Jamie Dillon,Jamie C, MD   02/23/2016 2:10 PM

## 2016-04-17 ENCOUNTER — Telehealth: Payer: Self-pay | Admitting: Family Medicine

## 2016-04-17 DIAGNOSIS — Z0000 Encounter for general adult medical examination without abnormal findings: Secondary | ICD-10-CM

## 2016-04-17 NOTE — Telephone Encounter (Signed)
-----   Message from Ellamae Sia sent at 04/14/2016  9:51 AM EST ----- Regarding: Lab orders for Friday, 12.8.17 Patient is scheduled for CPX labs, please order future labs, Thanks , Karna Christmas

## 2016-04-19 ENCOUNTER — Other Ambulatory Visit: Payer: Self-pay | Admitting: Oncology

## 2016-04-22 ENCOUNTER — Other Ambulatory Visit (INDEPENDENT_AMBULATORY_CARE_PROVIDER_SITE_OTHER): Payer: Medicare Other

## 2016-04-22 DIAGNOSIS — Z0000 Encounter for general adult medical examination without abnormal findings: Secondary | ICD-10-CM

## 2016-04-22 LAB — COMPREHENSIVE METABOLIC PANEL
ALBUMIN: 4.1 g/dL (ref 3.5–5.2)
ALK PHOS: 40 U/L (ref 39–117)
ALT: 19 U/L (ref 0–35)
AST: 19 U/L (ref 0–37)
BILIRUBIN TOTAL: 0.4 mg/dL (ref 0.2–1.2)
BUN: 19 mg/dL (ref 6–23)
CALCIUM: 9.3 mg/dL (ref 8.4–10.5)
CO2: 28 meq/L (ref 19–32)
CREATININE: 0.98 mg/dL (ref 0.40–1.20)
Chloride: 106 mEq/L (ref 96–112)
GFR: 60.15 mL/min (ref >60.00)
Glucose, Bld: 96 mg/dL (ref 70–99)
Potassium: 3.9 mEq/L (ref 3.5–5.1)
Sodium: 142 mEq/L (ref 135–145)
TOTAL PROTEIN: 7 g/dL (ref 6.0–8.3)

## 2016-04-22 LAB — CBC WITH DIFFERENTIAL/PLATELET
BASOS ABS: 0.1 10*3/uL (ref 0.0–0.1)
Basophils Relative: 0.8 % (ref 0.0–3.0)
EOS ABS: 0.2 10*3/uL (ref 0.0–0.7)
Eosinophils Relative: 3.4 % (ref 0.0–5.0)
HEMATOCRIT: 38.3 % (ref 36.0–46.0)
HEMOGLOBIN: 13.1 g/dL (ref 12.0–15.0)
LYMPHS PCT: 26.9 % (ref 12.0–46.0)
Lymphs Abs: 1.7 10*3/uL (ref 0.7–4.0)
MCHC: 34.3 g/dL (ref 30.0–36.0)
MCV: 95.6 fl (ref 78.0–100.0)
MONOS PCT: 8.6 % (ref 3.0–12.0)
Monocytes Absolute: 0.6 10*3/uL (ref 0.1–1.0)
Neutro Abs: 3.9 10*3/uL (ref 1.4–7.7)
Neutrophils Relative %: 60.3 % (ref 43.0–77.0)
PLATELETS: 237 10*3/uL (ref 150.0–400.0)
RBC: 4 Mil/uL (ref 3.87–5.11)
RDW: 12.5 % (ref 11.5–15.5)
WBC: 6.5 10*3/uL (ref 4.0–10.5)

## 2016-04-22 LAB — LIPID PANEL
CHOL/HDL RATIO: 4
Cholesterol: 143 mg/dL (ref 0–200)
HDL: 40.2 mg/dL (ref >39.00)
LDL Cholesterol: 67 mg/dL (ref 0–99)
NonHDL: 102.65
TRIGLYCERIDES: 176 mg/dL — AB (ref 0.0–149.0)
VLDL: 35.2 mg/dL (ref 0.0–40.0)

## 2016-04-22 LAB — TSH: TSH: 1.83 u[IU]/mL (ref 0.35–4.50)

## 2016-04-29 ENCOUNTER — Encounter: Payer: Self-pay | Admitting: Family Medicine

## 2016-04-30 ENCOUNTER — Other Ambulatory Visit: Payer: Self-pay | Admitting: Nurse Practitioner

## 2016-05-03 ENCOUNTER — Ambulatory Visit (INDEPENDENT_AMBULATORY_CARE_PROVIDER_SITE_OTHER): Payer: Medicare Other | Admitting: Family Medicine

## 2016-05-03 ENCOUNTER — Encounter: Payer: Self-pay | Admitting: Family Medicine

## 2016-05-03 VITALS — BP 128/72 | HR 67 | Temp 98.8°F | Ht 60.5 in | Wt 136.2 lb

## 2016-05-03 DIAGNOSIS — E7800 Pure hypercholesterolemia, unspecified: Secondary | ICD-10-CM | POA: Diagnosis not present

## 2016-05-03 DIAGNOSIS — C50511 Malignant neoplasm of lower-outer quadrant of right female breast: Secondary | ICD-10-CM

## 2016-05-03 DIAGNOSIS — I10 Essential (primary) hypertension: Secondary | ICD-10-CM

## 2016-05-03 DIAGNOSIS — Z0000 Encounter for general adult medical examination without abnormal findings: Secondary | ICD-10-CM | POA: Diagnosis not present

## 2016-05-03 DIAGNOSIS — Z170 Estrogen receptor positive status [ER+]: Secondary | ICD-10-CM

## 2016-05-03 MED ORDER — ATORVASTATIN CALCIUM 10 MG PO TABS: 10.0000 mg | ORAL_TABLET | Freq: Every day | ORAL | 3 refills | Status: DC

## 2016-05-03 MED ORDER — LISINOPRIL-HYDROCHLOROTHIAZIDE 20-25 MG PO TABS: 1.0000 | ORAL_TABLET | Freq: Every day | ORAL | 3 refills | Status: DC

## 2016-05-03 MED ORDER — METOPROLOL SUCCINATE ER 50 MG PO TB24: ORAL_TABLET | ORAL | 3 refills | Status: DC

## 2016-05-03 NOTE — Assessment & Plan Note (Signed)
Doing very well with tamoxifen

## 2016-05-03 NOTE — Assessment & Plan Note (Signed)
Disc goals for lipids and reasons to control them Rev labs with pt Rev low sat fat diet in detail Overall stable with atorvastatin and diet  Trig up a bit- disc fat and carbs in diet  Enc to keep exercising

## 2016-05-03 NOTE — Progress Notes (Signed)
Pre visit review using our clinic review tool, if applicable. No additional management support is needed unless otherwise documented below in the visit note. 

## 2016-05-03 NOTE — Progress Notes (Signed)
Subjective:    Patient ID: Jamie Dillon, female    DOB: [birth year], 65 y.o.   MRN: [id number]  HPI Here for health maintenance exam and to review chronic medical problems    Feeling pretty good except for a cold - getting over it  Caught it in [facility]   Wt Readings from Last 3 Encounters:  05/03/16 136 lb 4 oz (61.8 kg)  02/23/16 135 lb 9.6 oz (61.5 kg)  12/01/15 131 lb (59.4 kg)  bmi is 26.1  Has not had AMW  Hep C screening -not high risk/ declines   Mammogram 3/17 - everything was good / doing well  Self breast exam- none  Breast cancer R in 2016-still taking tamoxifen and tolerates it better now  Sister had breast cancer   Has had a hysterectomy   Colonoscopy 11/10- 10 year recall   Tetanus shot 10/12  dexa 1/16- mild osteopenia  Wants to hold off a year  No falls or fractures  Takes her ca and D  Exercise- usually regular / walkign   PNA completed Flu shot 9/17 Zoster vaccine 1/14  bp is stable today  No cp or palpitations or headaches or edema  No side effects to medicines  BP Readings from Last 3 Encounters:  05/03/16 128/72  02/23/16 120/68  12/01/15 134/82       Hx of hyperlipidemia Lab Results  Component Value Date   CHOL 143 04/22/2016   CHOL 148 04/20/2015   CHOL 167 04/21/2014   Lab Results  Component Value Date   HDL 40.20 04/22/2016   HDL 38.70 (L) 04/20/2015   HDL 42.50 04/21/2014   Lab Results  Component Value Date   LDLCALC 67 04/22/2016   LDLCALC 77 04/20/2015   LDLCALC 95 04/21/2014   Lab Results  Component Value Date   TRIG 176.0 (H) 04/22/2016   TRIG 162.0 (H) 04/20/2015   TRIG 149.0 04/21/2014   Lab Results  Component Value Date   CHOLHDL 4 04/22/2016   CHOLHDL 4 04/20/2015   CHOLHDL 4 04/21/2014   Lab Results  Component Value Date   LDLDIRECT 127.3 03/01/2011  atorvastatin and diet  Overall HDL is up a bit and LDL is down  She is eating pretty healthy most of the time   Results for orders placed or performed  in visit on 04/22/16  CBC with Differential/Platelet  Result Value Ref Range   WBC 6.5 4.0 - 10.5 K/uL   RBC 4.00 3.87 - 5.11 Mil/uL   Hemoglobin 13.1 12.0 - 15.0 g/dL   HCT 38.3 36.0 - 46.0 %   MCV 95.6 78.0 - 100.0 fl   MCHC 34.3 30.0 - 36.0 g/dL   RDW 12.5 11.5 - 15.5 %   Platelets 237.0 150.0 - 400.0 K/uL   Neutrophils Relative % 60.3 43.0 - 77.0 %   Lymphocytes Relative 26.9 12.0 - 46.0 %   Monocytes Relative 8.6 3.0 - 12.0 %   Eosinophils Relative 3.4 0.0 - 5.0 %   Basophils Relative 0.8 0.0 - 3.0 %   Neutro Abs 3.9 1.4 - 7.7 K/uL   Lymphs Abs 1.7 0.7 - 4.0 K/uL   Monocytes Absolute 0.6 0.1 - 1.0 K/uL   Eosinophils Absolute 0.2 0.0 - 0.7 K/uL   Basophils Absolute 0.1 0.0 - 0.1 K/uL  Comprehensive metabolic panel  Result Value Ref Range   Sodium 142 135 - 145 mEq/L   Potassium 3.9 3.5 - 5.1 mEq/L   Chloride 106 96 - 112 mEq/L  CO2 28 19 - 32 mEq/L   Glucose, Bld 96 70 - 99 mg/dL   BUN 19 6 - 23 mg/dL   Creatinine, Ser 0.98 0.40 - 1.20 mg/dL   Total Bilirubin 0.4 0.2 - 1.2 mg/dL   Alkaline Phosphatase 40 39 - 117 U/L   AST 19 0 - 37 U/L   ALT 19 0 - 35 U/L   Total Protein 7.0 6.0 - 8.3 g/dL   Albumin 4.1 3.5 - 5.2 g/dL   Calcium 9.3 8.4 - 10.5 mg/dL   GFR 60.15 >60.00 mL/min  Lipid panel  Result Value Ref Range   Cholesterol 143 0 - 200 mg/dL   Triglycerides 176.0 (H) 0.0 - 149.0 mg/dL   HDL 40.20 >39.00 mg/dL   VLDL 35.2 0.0 - 40.0 mg/dL   LDL Cholesterol 67 0 - 99 mg/dL   Total CHOL/HDL Ratio 4    NonHDL 102.65   TSH  Result Value Ref Range   TSH 1.83 0.35 - 4.50 uIU/mL      Patient Active Problem List   Diagnosis Date Noted  . Sinus tachycardia 09/21/2014  . Hyponatremia 09/21/2014  . Anxiety 07/08/2014  . Breast cancer of lower-outer quadrant of right female breast ([place]) 06/19/2014  . Encounter for Medicare annual wellness exam 04/25/2014  . Estrogen deficiency 04/25/2014  . ETD (eustachian tube dysfunction) 01/10/2014  . Stress reaction  01/03/2014  . Palpitations 01/03/2014  . Gynecological examination 03/04/2011  . Routine general medical examination at a health care facility 02/26/2011  . HYPERCHOLESTEROLEMIA, PURE 02/01/2007  . Essential hypertension 09/08/2006  . ALLERGIC RHINITIS 09/08/2006  . TOBACCO ABUSE, HX OF 09/08/2006   Past Medical History:  Diagnosis Date  . Allergic rhinitis   . Anxiety   . Breast cancer of lower-outer quadrant of right female breast ([place]) 06/19/2014  . Disorder of bone and cartilage, unspecified   . Family history of malignant neoplasm of breast   . Family history of other kidney diseases   . HLD (hyperlipidemia)   . HTN (hypertension)   . PONV (postoperative nausea and vomiting)   . Tobacco abuse    Past Surgical History:  Procedure Laterality Date  . ABDOMINAL HYSTERECTOMY    . APPENDECTOMY    . BREAST LUMPECTOMY WITH NEEDLE LOCALIZATION AND AXILLARY SENTINEL LYMPH NODE BX Right 07/21/2014   Procedure: BREAST LUMPECTOMY WITH NEEDLE LOCALIZATION AND AXILLARY SENTINEL LYMPH NODE BIOPSY;  Surgeon: Rolm Bookbinder, MD;  Location: [place];  Service: General;  Laterality: Right;  . COLONOSCOPY    . DILATION AND CURETTAGE OF UTERUS    . PORT-A-CATH REMOVAL N/A 03/12/2015   Procedure: REMOVAL PORT-A-CATH;  Surgeon: Rolm Bookbinder, MD;  Location: [place];  Service: General;  Laterality: N/A;  . PORTACATH PLACEMENT Right 09/10/2014   Procedure: INSERTION PORT-A-CATH;  Surgeon: Rolm Bookbinder, MD;  Location: [place];  Service: General;  Laterality: Right;  . TOTAL VAGINAL HYSTERECTOMY  1996   endometriosis   Social History  Substance Use Topics  . Smoking status: Former Smoker    Packs/day: 1.00    Types: Cigarettes    Quit date: 06/25/2010  . Smokeless tobacco: Not on file  . Alcohol use 0.0 oz/week     Comment: occasional wine   Family History  Problem Relation Age of Onset  . Breast cancer Sister 64  . Hypertension Mother   . Kidney  disease Father   . Diabetes Paternal Aunt    Allergies  Allergen Reactions  . Dust  Mite Extract   . Pollen Extract    Current Outpatient Prescriptions on File Prior to Visit  Medication Sig Dispense Refill  . ALPRAZolam (XANAX) 0.5 MG tablet TAKE 1 TABLET EVERY DAY AS NEEDED 30 tablet 0  . atorvastatin (LIPITOR) 10 MG tablet Take 1 tablet (10 mg total) by mouth daily. 90 tablet 3  . b complex vitamins tablet Take 1 tablet by mouth daily.     . Calcium Carbonate-Vitamin D (CALCIUM-VITAMIN D) 500-200 MG-UNIT per tablet Take 3 tablets by mouth every morning.      . diphenhydrAMINE (BENADRYL) 25 MG tablet Take 25 mg by mouth daily as needed for allergies.     Marland Kitchen lisinopril-hydrochlorothiazide (PRINZIDE,ZESTORETIC) 20-25 MG tablet Take 1 tablet by mouth daily. 90 tablet 2  . metoprolol succinate (TOPROL-XL) 50 MG 24 hr tablet TAKE 1 TABLET EVERY DAY WITH OR IMMEDIATELY FOLLOWING A MEAL 90 tablet 1  . Multiple Vitamin (MULTIVITAMIN) tablet Take 1 tablet by mouth daily.      . tamoxifen (NOLVADEX) 20 MG tablet TAKE 1 TABLET EVERY DAY 90 tablet 2   No current facility-administered medications on file prior to visit.     Review of Systems Review of Systems  Constitutional: Negative for fever, appetite change, fatigue and unexpected weight change.  Eyes: Negative for pain and visual disturbance.  ENT pos for pnd/ congestion  Respiratory: Negative for wheeze and shortness of breath.   Cardiovascular: Negative for cp or palpitations    Gastrointestinal: Negative for nausea, diarrhea and constipation.  Genitourinary: Negative for urgency and frequency.  Skin: Negative for pallor or rash   Neurological: Negative for weakness, light-headedness, numbness and headaches.  Hematological: Negative for adenopathy. Does not bruise/bleed easily.  Psychiatric/Behavioral: Negative for dysphoric mood. The patient is not nervous/anxious.         Objective:   Physical Exam  Constitutional: She appears  well-developed and well-nourished. No distress.  Well appearing  HENT:  Head: Normocephalic and atraumatic.  Right Ear: External ear normal.  Left Ear: External ear normal.  Mouth/Throat: Oropharynx is clear and moist.  Boggy nares and pnd No sinus tenderness  Eyes: Conjunctivae and EOM are normal. Pupils are equal, round, and reactive to light. No scleral icterus.  Neck: Normal range of motion. Neck supple. No JVD present. Carotid bruit is not present. No thyromegaly present.  Cardiovascular: Normal rate, regular rhythm, normal heart sounds and intact distal pulses.  Exam reveals no gallop.   Pulmonary/Chest: Effort normal and breath sounds normal. No respiratory distress. She has no wheezes. She exhibits no tenderness.  Abdominal: Soft. Bowel sounds are normal. She exhibits no distension, no abdominal bruit and no mass. There is no tenderness.  Genitourinary: No breast swelling, tenderness, discharge or bleeding.  Genitourinary Comments: R breast-baseline surgical changes Breast exam: No mass, nodules, thickening, tenderness, bulging, retraction, inflamation, nipple discharge or skin changes noted.  No axillary or clavicular LA.      Musculoskeletal: Normal range of motion. She exhibits no edema or tenderness.  Lymphadenopathy:    She has no cervical adenopathy.  Neurological: She is alert. She has normal reflexes. No cranial nerve deficit. She exhibits normal muscle tone. Coordination normal.  Skin: Skin is warm and dry. No rash noted. No erythema. No pallor.  Solar lentigines diffusely  Psychiatric: She has a normal mood and affect.          Assessment & Plan:   Problem List Items Addressed This Visit      Cardiovascular and Mediastinum  Essential hypertension - Primary    bp in fair control at this time  BP Readings from Last 1 Encounters:  05/03/16 128/72   No changes needed Disc lifstyle change with low sodium diet and exercise  Labs reviewed      Relevant  Medications   metoprolol succinate (TOPROL-XL) 50 MG 24 hr tablet   lisinopril-hydrochlorothiazide (PRINZIDE,ZESTORETIC) 20-25 MG tablet   atorvastatin (LIPITOR) 10 MG tablet     Other   Routine general medical examination at a health care facility    Reviewed health habits including diet and exercise and skin cancer prevention Reviewed appropriate screening tests for age  Also reviewed health mt list, fam hx and immunization status , as well as social and family history   See HPI Declines AMW for now but may return for it later  Labs reviewed  utd immunizations  Enc continued exercise  Doing well with breast cancer f/u  Declines hep c screen- low risk Declines dexa this year  utd colon cancer screening         HYPERCHOLESTEROLEMIA, PURE    Disc goals for lipids and reasons to control them Rev labs with pt Rev low sat fat diet in detail Overall stable with atorvastatin and diet  Trig up a bit- disc fat and carbs in diet  Enc to keep exercising      Relevant Medications   metoprolol succinate (TOPROL-XL) 50 MG 24 hr tablet   lisinopril-hydrochlorothiazide (PRINZIDE,ZESTORETIC) 20-25 MG tablet   atorvastatin (LIPITOR) 10 MG tablet   Breast cancer of lower-outer quadrant of right female breast ([place])    Doing very well with tamoxifen

## 2016-05-03 NOTE — Patient Instructions (Signed)
Take care of yourself  Stay active  If you want to come back for the AMW ( medicare interview) - call and schedule that here with our nurse [facility] are stable

## 2016-05-03 NOTE — Assessment & Plan Note (Signed)
Reviewed health habits including diet and exercise and skin cancer prevention Reviewed appropriate screening tests for age  Also reviewed health mt list, fam hx and immunization status , as well as social and family history   See HPI Declines AMW for now but may return for it later  Labs reviewed  utd immunizations  Enc continued exercise  Doing well with breast cancer f/u  Declines hep c screen- low risk Declines dexa this year  utd colon cancer screening

## 2016-05-03 NOTE — Assessment & Plan Note (Signed)
bp in fair control at this time  BP Readings from Last 1 Encounters:  05/03/16 128/72   No changes needed Disc lifstyle change with low sodium diet and exercise  Labs reviewed

## 2016-06-08 ENCOUNTER — Other Ambulatory Visit: Payer: Self-pay | Admitting: Family Medicine

## 2016-06-15 ENCOUNTER — Ambulatory Visit (INDEPENDENT_AMBULATORY_CARE_PROVIDER_SITE_OTHER): Payer: Medicare Other

## 2016-06-15 VITALS — BP 122/70 | HR 86 | Temp 98.4°F | Ht 61.0 in | Wt 136.0 lb

## 2016-06-15 DIAGNOSIS — Z1159 Encounter for screening for other viral diseases: Secondary | ICD-10-CM | POA: Diagnosis not present

## 2016-06-15 DIAGNOSIS — Z0000 Encounter for general adult medical examination without abnormal findings: Secondary | ICD-10-CM | POA: Diagnosis not present

## 2016-06-15 NOTE — Progress Notes (Signed)
PCP notes:   Health maintenance:  Hep C screening - completed  Abnormal screenings:   None  Patient concerns:   None  Nurse concerns:  None  Next PCP appt:   05/12/17 @ 0930  I reviewed health advisor's note, was available for consultation, and agree with documentation and plan. Loura Pardon MD

## 2016-06-15 NOTE — Progress Notes (Signed)
Pre visit review using our clinic review tool, if applicable. No additional management support is needed unless otherwise documented below in the visit note. 

## 2016-06-15 NOTE — Progress Notes (Signed)
Subjective:   Jamie Dillon is a 66 y.o. female who presents for Medicare Annual (Subsequent) preventive examination.  Review of Systems:  N/A Cardiac Risk Factors include: advanced age (>4men, >25 women);dyslipidemia;hypertension     Objective:     Vitals: BP 122/70 (BP Location: Left Arm, Patient Position: Sitting, Cuff Size: Normal)   Pulse 86   Temp 98.4 F (36.9 C) (Oral)   Ht 5\' 1"  (1.549 m)   Wt 136 lb (61.7 kg)   SpO2 99%   BMI 25.70 kg/m   Body mass index is 25.7 kg/m.   Tobacco History  Smoking Status  . Former Smoker  . Packs/day: 1.00  . Types: Cigarettes  . Quit date: 06/25/2010  Smokeless Tobacco  . Never Used     Counseling given: No   Past Medical History:  Diagnosis Date  . Allergic rhinitis   . Anxiety   . Breast cancer of lower-outer quadrant of right female breast ([place]) 06/19/2014  . Disorder of bone and cartilage, unspecified   . Family history of malignant neoplasm of breast   . Family history of other kidney diseases   . HLD (hyperlipidemia)   . HTN (hypertension)   . PONV (postoperative nausea and vomiting)   . Tobacco abuse    Past Surgical History:  Procedure Laterality Date  . ABDOMINAL HYSTERECTOMY    . APPENDECTOMY    . BREAST LUMPECTOMY WITH NEEDLE LOCALIZATION AND AXILLARY SENTINEL LYMPH NODE BX Right 07/21/2014   Procedure: BREAST LUMPECTOMY WITH NEEDLE LOCALIZATION AND AXILLARY SENTINEL LYMPH NODE BIOPSY;  Surgeon: Rolm Bookbinder, MD;  Location: [facility];  Service: General;  Laterality: Right;  . COLONOSCOPY    . DILATION AND CURETTAGE OF UTERUS    . PORT-A-CATH REMOVAL N/A 03/12/2015   Procedure: REMOVAL PORT-A-CATH;  Surgeon: Rolm Bookbinder, MD;  Location: [facility];  Service: General;  Laterality: N/A;  . PORTACATH PLACEMENT Right 09/10/2014   Procedure: INSERTION PORT-A-CATH;  Surgeon: Rolm Bookbinder, MD;  Location: [facility];  Service: General;  Laterality: Right;  . TOTAL VAGINAL  HYSTERECTOMY  1996   endometriosis   Family History  Problem Relation Age of Onset  . Hypertension Mother   . Kidney disease Father   . Breast cancer Sister 29  . Diabetes Paternal Aunt    History  Sexual Activity  . Sexual activity: Not on file    Outpatient Encounter Prescriptions as of 06/15/2016  Medication Sig  . ALPRAZolam (XANAX) 0.5 MG tablet TAKE 1 TABLET EVERY DAY AS NEEDED  . atorvastatin (LIPITOR) 10 MG tablet Take 1 tablet (10 mg total) by mouth daily.  Marland Kitchen b complex vitamins tablet Take 1 tablet by mouth daily.   . Calcium Carbonate-Vitamin D (CALCIUM-VITAMIN D) 500-200 MG-UNIT per tablet Take 3 tablets by mouth every morning.    . diphenhydrAMINE (BENADRYL) 25 MG tablet Take 25 mg by mouth daily as needed for allergies.   Marland Kitchen lisinopril-hydrochlorothiazide (PRINZIDE,ZESTORETIC) 20-25 MG tablet Take 1 tablet by mouth daily.  . metoprolol succinate (TOPROL-XL) 50 MG 24 hr tablet TAKE 1 TABLET EVERY DAY WITH OR IMMEDIATELY FOLLOWING A MEAL  . Multiple Vitamin (MULTIVITAMIN) tablet Take 1 tablet by mouth daily.    . tamoxifen (NOLVADEX) 20 MG tablet TAKE 1 TABLET EVERY DAY   No facility-administered encounter medications on file as of 06/15/2016.     Activities of Daily Living In your present state of health, do you have any difficulty performing the following activities: 06/15/2016  Hearing? N  Vision? N  Difficulty concentrating or making decisions? N  Walking or climbing stairs? N  Dressing or bathing? N  Doing errands, shopping? N  Preparing Food and eating ? N  Using the Toilet? N  In the past six months, have you accidently leaked urine? N  Do you have problems with loss of bowel control? N  Managing your Medications? N  Managing your Finances? N  Housekeeping or managing your Housekeeping? N  Some recent data might be hidden    Patient Care Team: Abner Greenspan, MD as PCP - General Rolm Bookbinder, MD as Consulting Physician (General Surgery) Chauncey Cruel, MD as Consulting Physician (Oncology) Eppie Gibson, MD as Attending Physician (Radiation Oncology) Rockwell Germany, RN as Registered Nurse Mauro Kaufmann, RN as Registered Nurse Holley Bouche, NP as Nurse Practitioner (Nurse Practitioner) Sylvan Cheese, NP as Nurse Practitioner (Nurse Practitioner)    Assessment:     Hearing Screening   125Hz  250Hz  500Hz  1000Hz  2000Hz  3000Hz  4000Hz  6000Hz  8000Hz   Right ear:   40 40 40  40    Left ear:   40 40 40  40    Vision Screening Comments: Last vision exam in Nov 2017 with Dr. Elmer Sow   Exercise Activities and Dietary recommendations Current Exercise Habits: Home exercise routine, Type of exercise: walking, Time (Minutes): 30, Frequency (Times/Week): 5, Weekly Exercise (Minutes/Week): 150, Intensity: Moderate, Exercise limited by: None identified  Goals    . Increase physical activity          Starting 06/15/2016, I will continue to walk at least 30 min 5-7 days per week as weather permits.       Fall Risk Fall Risk  06/15/2016 05/03/2016 04/28/2015 02/27/2015 04/25/2014  Falls in the past year? No No No No No   Depression Screen PHQ 2/9 Scores 06/15/2016 05/03/2016 04/28/2015 02/27/2015  PHQ - 2 Score 0 0 0 0     Cognitive Function MMSE - Mini Mental State Exam 06/15/2016  Orientation to time 5  Orientation to Place 5  Registration 3  Attention/ Calculation 0  Recall 3  Language- name 2 objects 0  Language- repeat 1  Language- follow 3 step command 3  Language- read & follow direction 0  Write a sentence 0  Copy design 0  Total score 20     PLEASE NOTE: A Mini-Cog screen was completed. Maximum score is 20. A value of 0 denotes this part of [place] MMSE was not completed or the patient failed this part of the Mini-Cog screening.   Mini-Cog Screening Orientation to Time - Max 5 pts Orientation to Place - Max 5 pts Registration - Max 3 pts Recall - Max 3 pts Language Repeat - Max 1 pts Language Follow 3  Step Command - Max 3 pts     Immunization History  Administered Date(s) Administered  . Influenza Split 03/04/2011  . Influenza Whole 02/13/2008, 02/16/2009, 03/01/2010  . Influenza, High Dose Seasonal PF 03/12/2015, 01/26/2016  . Influenza-Unspecified 02/14/2014  . Pneumococcal Conjugate-13 04/28/2015  . Pneumococcal Polysaccharide-23 02/13/2008, 04/25/2014  . Td 10/04/2001  . Tdap 03/04/2011   Screening Tests Health Maintenance  Topic Date Due  . MAMMOGRAM  07/20/2016  . COLONOSCOPY  03/31/2019  . TETANUS/TDAP  03/03/2021  . INFLUENZA VACCINE  Addressed  . DEXA SCAN  Completed  . ZOSTAVAX  Addressed  . Hepatitis C Screening  Completed  . PNA vac Low Risk Adult  Completed      Plan:  I have personally reviewed and addressed the Medicare Annual Wellness questionnaire and have noted the following in the patient's chart:  A. Medical and social history B. Use of alcohol, tobacco or illicit drugs  C. Current medications and supplements D. Functional ability and status E.  Nutritional status F.  Physical activity G. Advance directives H. List of other physicians I.  Hospitalizations, surgeries, and ER visits in previous 12 months J.  [place] to include hearing, vision, cognitive, depression L. Referrals and appointments - none  In addition, I have reviewed and discussed with patient certain preventive protocols, quality metrics, and best practice recommendations. A written personalized care plan for preventive services as well as general preventive health recommendations were provided to patient.  See attached scanned questionnaire for additional information.   Signed,   Lindell Noe, MHA, BS, LPN Health Coach

## 2016-06-15 NOTE — Patient Instructions (Signed)
Jamie Dillon , Thank you for taking time to come for your Medicare Wellness Visit. I appreciate your ongoing commitment to your health goals. Please review the following plan we discussed and let me know if I can assist you in the future.   These are the goals we discussed: Goals    . Increase physical activity          Starting 06/15/2016, I will continue to walk at least 30 min 5-7 days per week as weather permits.        This is a list of the screening recommended for you and due dates:  Health Maintenance  Topic Date Due  . Mammogram  07/20/2016  . Colon Cancer Screening  03/31/2019  . Tetanus Vaccine  03/03/2021  . Flu Shot  Addressed  . DEXA scan (bone density measurement)  Completed  . Shingles Vaccine  Addressed  .  Hepatitis C: One time screening is recommended by Center for Disease Control  (CDC) for  adults born from [birth year] through 1967.   Completed  . Pneumonia vaccines  Completed   Preventive Care for Adults  A healthy lifestyle and preventive care can promote health and wellness. Preventive health guidelines for adults include the following key practices.  . A routine yearly physical is a good way to check with your health care provider about your health and preventive screening. It is a chance to share any concerns and updates on your health and to receive a thorough exam.  . Visit your dentist for a routine exam and preventive care every 6 months. Brush your teeth twice a day and floss once a day. Good oral hygiene prevents tooth decay and gum disease.  . The frequency of eye exams is based on your age, health, family medical history, use  of contact lenses, and other factors. Follow your health care provider's ecommendations for frequency of eye exams.  . Eat a healthy diet. Foods like vegetables, fruits, whole grains, low-fat dairy products, and lean protein foods contain the nutrients you need without too many calories. Decrease your intake of foods high in solid  fats, added sugars, and salt. Eat the right amount of calories for you. Get information about a proper diet from your health care provider, if necessary.  . Regular physical exercise is one of the most important things you can do for your health. Most adults should get at least 150 minutes of moderate-intensity exercise (any activity that increases your heart rate and causes you to sweat) each week. In addition, most adults need muscle-strengthening exercises on 2 or more days a week.  Silver Sneakers may be a benefit available to you. To determine eligibility, you may visit the website: www.silversneakers.com or contact program at 8154543628 Mon-Fri between 8AM-8PM.   . Maintain a healthy weight. The body mass index (BMI) is a screening tool to identify possible weight problems. It provides an estimate of body fat based on height and weight. Your health care provider can find your BMI and can help you achieve or maintain a healthy weight.   For adults 20 years and older: ? A BMI below 18.5 is considered underweight. ? A BMI of 18.5 to 24.9 is normal. ? A BMI of 25 to 29.9 is considered overweight. ? A BMI of 30 and above is considered obese.   . Maintain normal blood lipids and cholesterol levels by exercising and minimizing your intake of saturated fat. Eat a balanced diet with plenty of fruit and vegetables. Blood tests  for lipids and cholesterol should begin at age 27 and be repeated every 5 years. If your lipid or cholesterol levels are high, you are over 50, or you are at high risk for heart disease, you may need your cholesterol levels checked more frequently. Ongoing high lipid and cholesterol levels should be treated with medicines if diet and exercise are not working.  . If you smoke, find out from your health care provider how to quit. If you do not use tobacco, please do not start.  . If you choose to drink alcohol, please do not consume more than 2 drinks per day. One drink is  considered to be 12 ounces (355 mL) of beer, 5 ounces (148 mL) of wine, or 1.5 ounces (44 mL) of liquor.  . If you are 28-39 years old, ask your health care provider if you should take aspirin to prevent strokes.  . Use sunscreen. Apply sunscreen liberally and repeatedly throughout the day. You should seek shade when your shadow is shorter than you. Protect yourself by wearing long sleeves, pants, a wide-brimmed hat, and sunglasses year round, whenever you are outdoors.  . Once a month, do a whole body skin exam, using a mirror to look at the skin on your back. Tell your health care provider of new moles, moles that have irregular borders, moles that are larger than a pencil eraser, or moles that have changed in shape or color.

## 2016-06-16 LAB — HEPATITIS C ANTIBODY: HCV AB: NEGATIVE

## 2016-06-22 ENCOUNTER — Telehealth: Payer: Self-pay

## 2016-06-22 MED ORDER — OSELTAMIVIR PHOSPHATE 75 MG PO CAPS: 75.0000 mg | ORAL_CAPSULE | Freq: Every day | ORAL | 0 refills | Status: DC

## 2016-06-22 NOTE — Telephone Encounter (Signed)
Left voicemail letting pt know Rx sent and advise of Dr. Marliss Coots

## 2016-06-22 NOTE — Telephone Encounter (Signed)
I sent it  Wash hands/ wear a mask if necessary

## 2016-06-22 NOTE — Telephone Encounter (Signed)
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 Request preventative tamiflu to [facility]. pts daughter is scheduled for surgery on 06/24/15. Last annual 05/03/16.

## 2016-07-26 ENCOUNTER — Other Ambulatory Visit: Payer: Self-pay | Admitting: Family Medicine

## 2016-07-26 ENCOUNTER — Encounter: Payer: Self-pay | Admitting: Oncology

## 2016-07-26 NOTE — Telephone Encounter (Signed)
Pt had CPE on 05/03/16 (has this yrs scheduled too), last filled on 09/23/15 #30 tabs with 0 refills, please advise

## 2016-07-26 NOTE — Telephone Encounter (Signed)
Rx called in as prescribed 

## 2016-07-26 NOTE — Telephone Encounter (Signed)
Px written for call in   

## 2016-08-05 IMAGING — CR DG CHEST 2V
2 series · 2 of 2 positions shown · non-contrast
Comparison: 09/10/2014

CLINICAL DATA: Fever and nausea. Abdominal tightness, 2 days
duration. Personal history of right breast cancer. Last chemotherapy
09/15/2014.

EXAM:
CHEST  2 VIEW

[w chest pa]
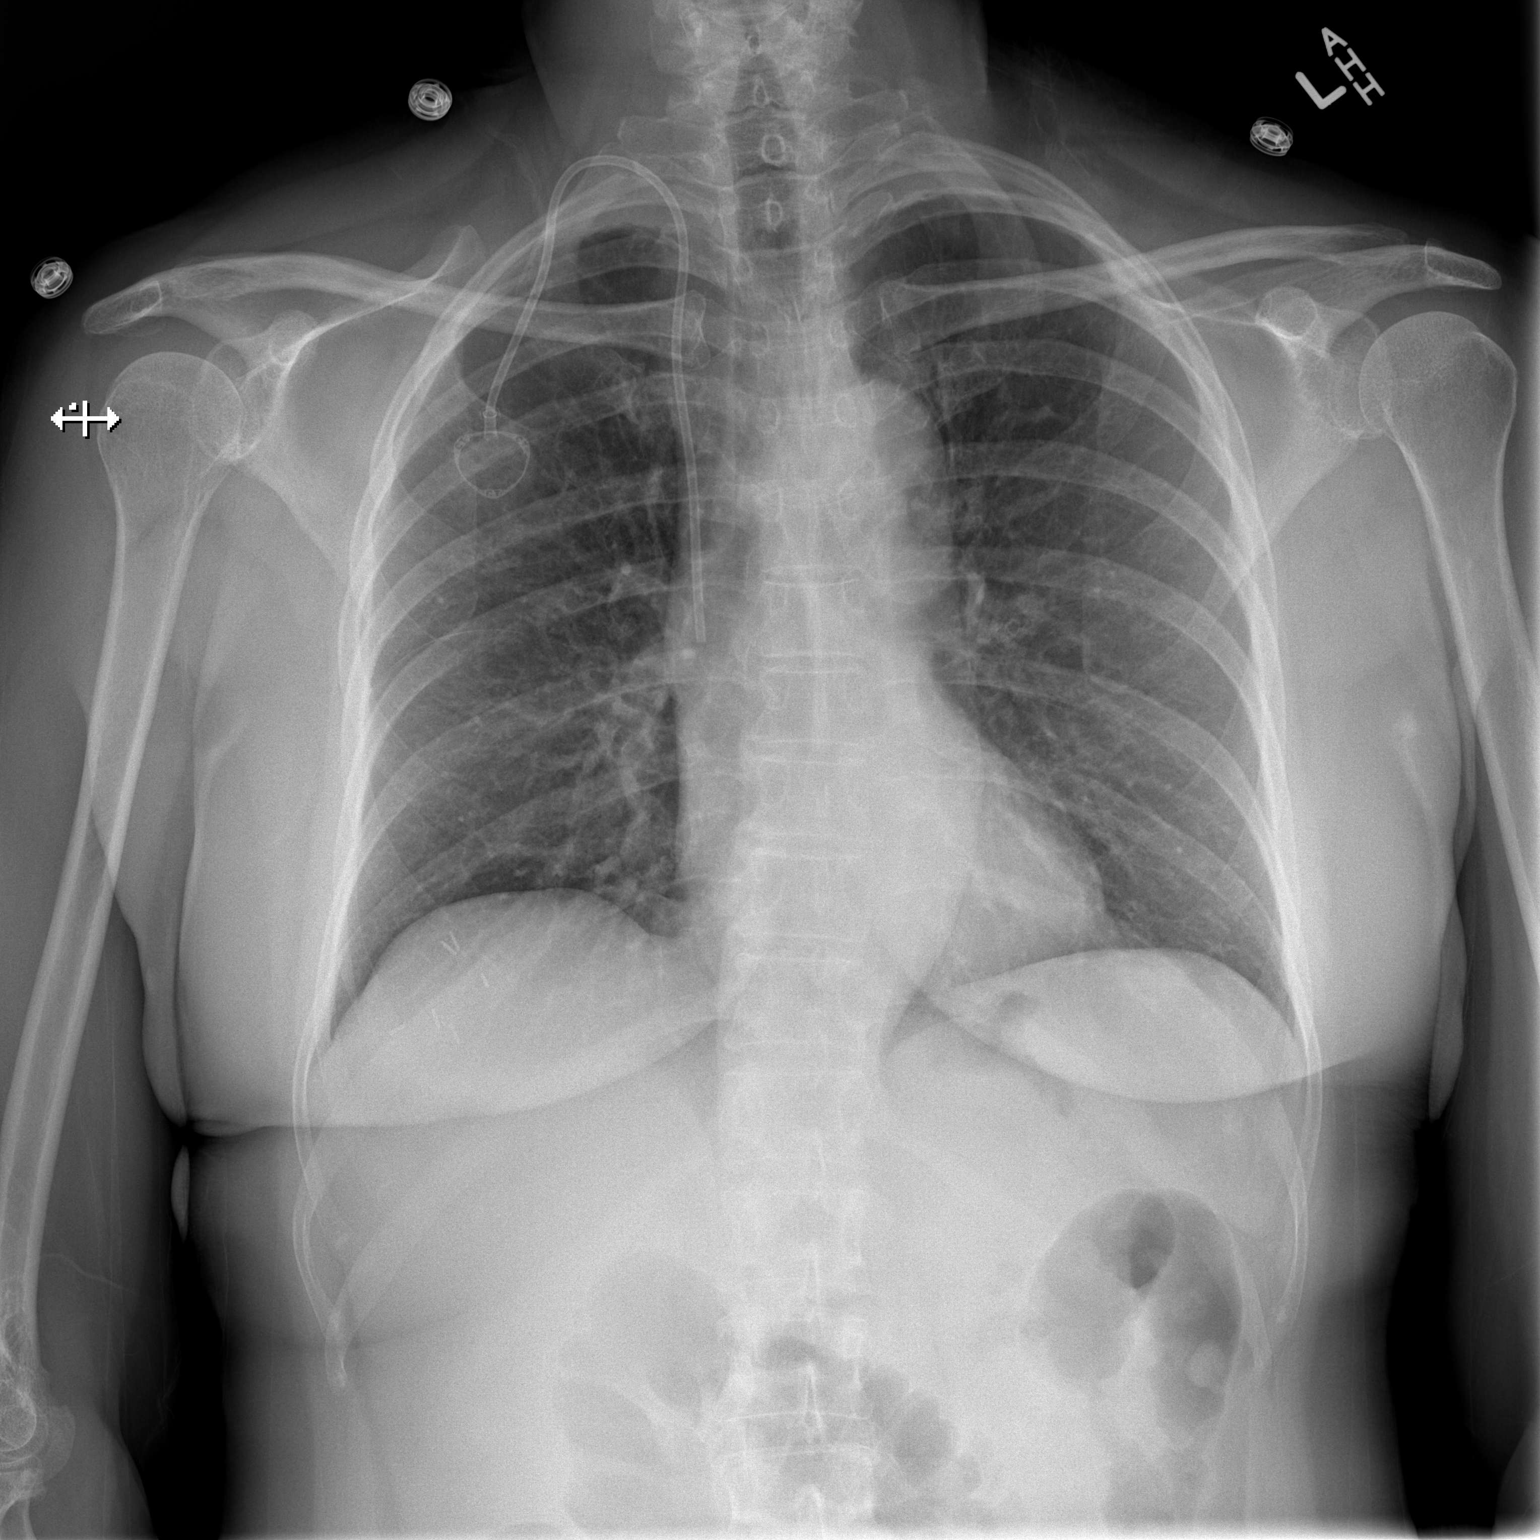

[w chest lat]
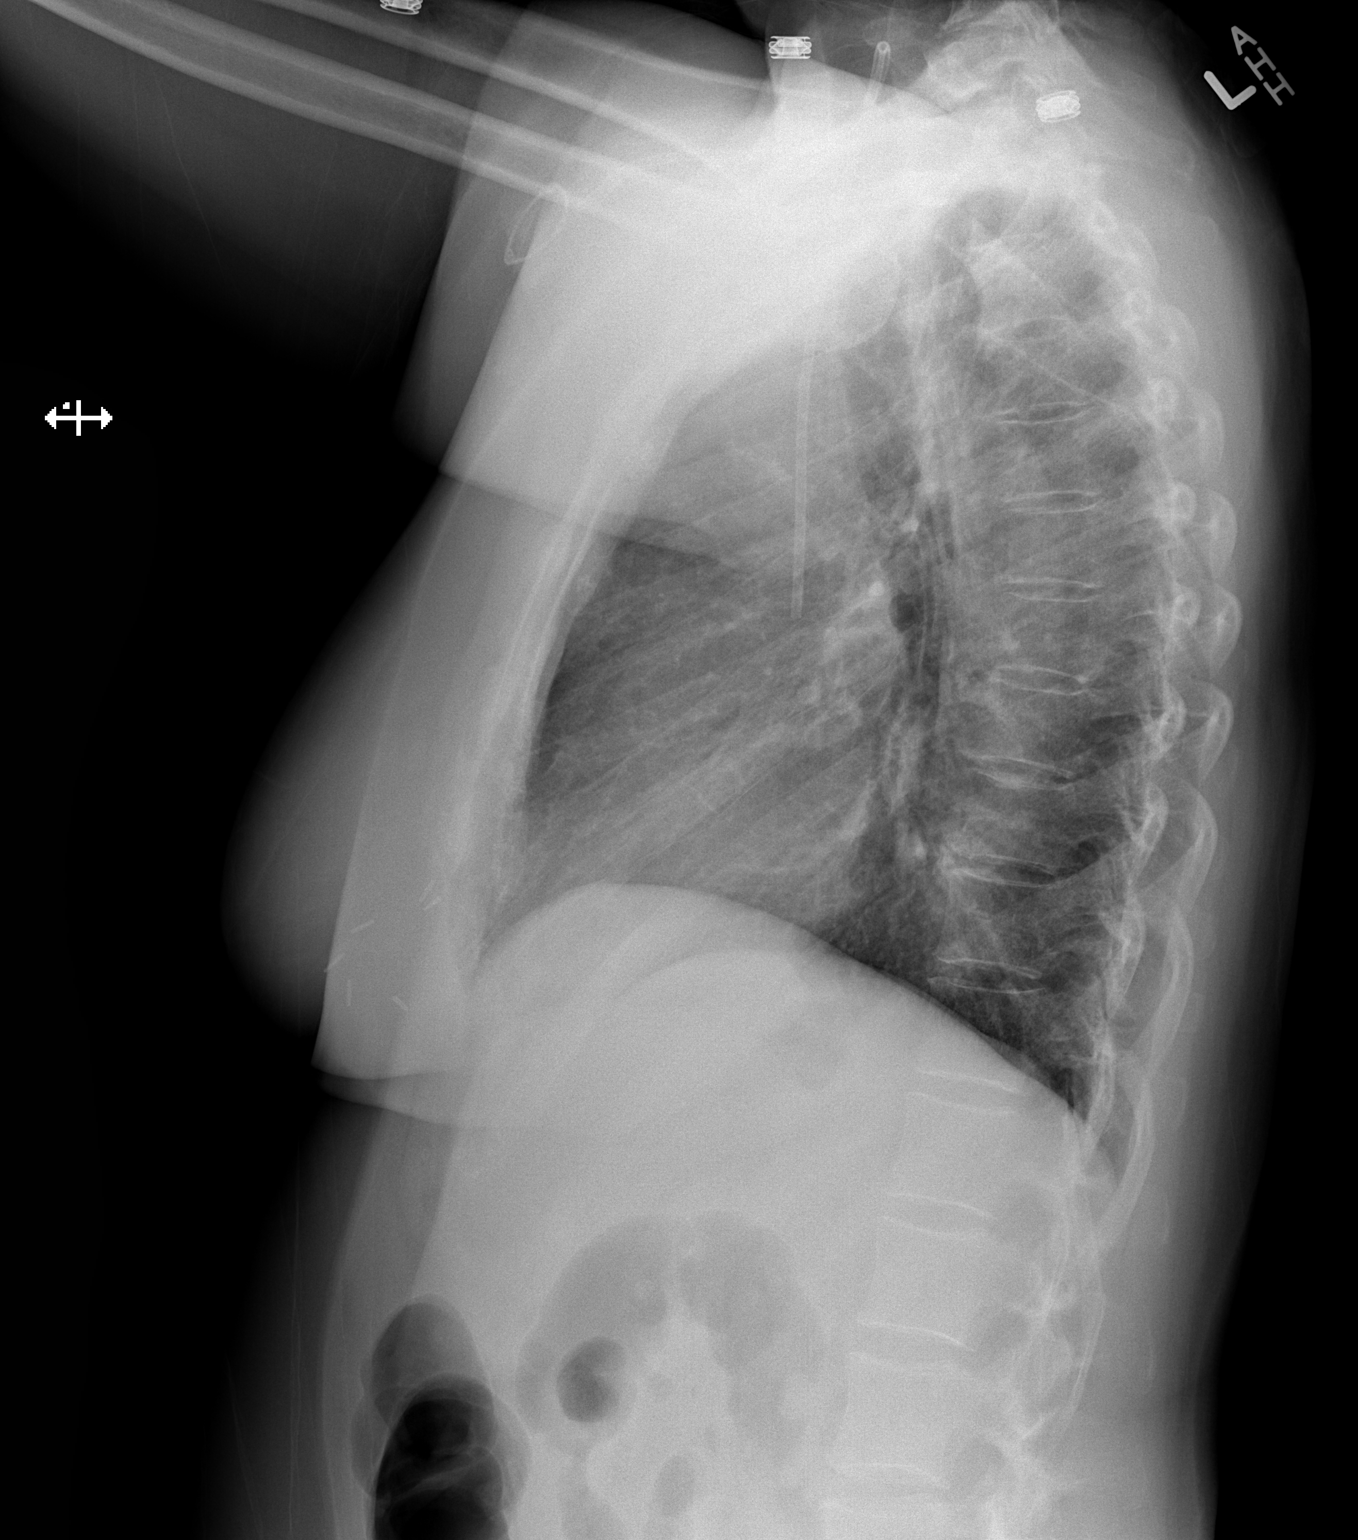

[2 of 2 positions shown; findings below may reference images not displayed]

FINDINGS: Heart size is normal. The aorta is unfolded. The lungs are clear.
The vascularity is normal. Power port inserted from the right
internal jugular approach has its tip in the SVC 2 cm above right
atrium. No effusions. No bony abnormalities. Surgical clips in the
region of the right breast.
IMPRESSION: No active disease.  Unfolded aorta.  Power port.

## 2016-09-09 ENCOUNTER — Encounter: Payer: Self-pay | Admitting: Family Medicine

## 2016-09-09 ENCOUNTER — Ambulatory Visit (INDEPENDENT_AMBULATORY_CARE_PROVIDER_SITE_OTHER): Payer: Medicare Other | Admitting: Family Medicine

## 2016-09-09 VITALS — BP 132/76 | HR 84 | Temp 98.1°F | Wt 137.5 lb

## 2016-09-09 DIAGNOSIS — J0110 Acute frontal sinusitis, unspecified: Secondary | ICD-10-CM

## 2016-09-09 DIAGNOSIS — J019 Acute sinusitis, unspecified: Secondary | ICD-10-CM | POA: Insufficient documentation

## 2016-09-09 DIAGNOSIS — J0100 Acute maxillary sinusitis, unspecified: Secondary | ICD-10-CM | POA: Insufficient documentation

## 2016-09-09 MED ORDER — AMOXICILLIN-POT CLAVULANATE 875-125 MG PO TABS: 1.0000 | ORAL_TABLET | Freq: Two times a day (BID) | ORAL | 0 refills | Status: DC

## 2016-09-09 NOTE — Progress Notes (Signed)
Subjective:    Patient ID: Jamie Dillon, female    DOB: 01-09-1951, 66 y.o.   MRN: [id number]  HPI Here for uri symptoms incl congestion and cough   Symptoms started 2 weeks ago  Voice is improving - lost voice entirely  Scratchy throat-not sore   R ear hurts - all the time , no ear drainage Stuffy - nasal d/c is clearer that chest d/c   Still coughing up phlegm-yellow / can feel in her throat - improved a little   Headache- facial pressure / across sinuses -both side   No fever   Patient Active Problem List   Diagnosis Date Noted  . Sinus tachycardia 09/21/2014  . Anxiety 07/08/2014  . Breast cancer of lower-outer quadrant of right female breast ([place]) 06/19/2014  . Encounter for Medicare annual wellness exam 04/25/2014  . Estrogen deficiency 04/25/2014  . Stress reaction 01/03/2014  . Palpitations 01/03/2014  . Routine general medical examination at a health care facility 02/26/2011  . HYPERCHOLESTEROLEMIA, PURE 02/01/2007  . Essential hypertension 09/08/2006  . ALLERGIC RHINITIS 09/08/2006  . TOBACCO ABUSE, HX OF 09/08/2006   Past Medical History:  Diagnosis Date  . Allergic rhinitis   . Anxiety   . Breast cancer of lower-outer quadrant of right female breast ([place]) 06/19/2014  . Disorder of bone and cartilage, unspecified   . Family history of malignant neoplasm of breast   . Family history of other kidney diseases   . HLD (hyperlipidemia)   . HTN (hypertension)   . PONV (postoperative nausea and vomiting)   . Tobacco abuse    Past Surgical History:  Procedure Laterality Date  . ABDOMINAL HYSTERECTOMY    . APPENDECTOMY    . BREAST LUMPECTOMY WITH NEEDLE LOCALIZATION AND AXILLARY SENTINEL LYMPH NODE BX Right 07/21/2014   Procedure: BREAST LUMPECTOMY WITH NEEDLE LOCALIZATION AND AXILLARY SENTINEL LYMPH NODE BIOPSY;  Surgeon: Rolm Bookbinder, MD;  Location: [facility];  Service: General;  Laterality: Right;  . COLONOSCOPY    . DILATION AND CURETTAGE OF UTERUS    .  PORT-A-CATH REMOVAL N/A 03/12/2015   Procedure: REMOVAL PORT-A-CATH;  Surgeon: Rolm Bookbinder, MD;  Location: [facility];  Service: General;  Laterality: N/A;  . PORTACATH PLACEMENT Right 09/10/2014   Procedure: INSERTION PORT-A-CATH;  Surgeon: Rolm Bookbinder, MD;  Location: [facility];  Service: General;  Laterality: Right;  . TOTAL VAGINAL HYSTERECTOMY  1996   endometriosis   Social History  Substance Use Topics  . Smoking status: Former Smoker    Packs/day: 1.00    Types: Cigarettes    Quit date: 06/25/2010  . Smokeless tobacco: Never Used  . Alcohol use 0.0 oz/week     Comment: occasional wine   Family History  Problem Relation Age of Onset  . Hypertension Mother   . Kidney disease Father   . Breast cancer Sister 84  . Diabetes Paternal Aunt    Allergies  Allergen Reactions  . Dust Mite Extract   . Pollen Extract    Current Outpatient Prescriptions on File Prior to Visit  Medication Sig Dispense Refill  . ALPRAZolam (XANAX) 0.5 MG tablet TAKE 1 TABLET EVERY DAY AS NEEDED 30 tablet 0  . atorvastatin (LIPITOR) 10 MG tablet Take 1 tablet (10 mg total) by mouth daily. 90 tablet 3  . b complex vitamins tablet Take 1 tablet by mouth daily.     . Calcium Carbonate-Vitamin D (CALCIUM-VITAMIN D) 500-200 MG-UNIT per tablet Take 3 tablets by mouth every  morning.      . diphenhydrAMINE (BENADRYL) 25 MG tablet Take 25 mg by mouth daily as needed for allergies.     Marland Kitchen lisinopril-hydrochlorothiazide (PRINZIDE,ZESTORETIC) 20-25 MG tablet Take 1 tablet by mouth daily. 90 tablet 3  . metoprolol succinate (TOPROL-XL) 50 MG 24 hr tablet TAKE 1 TABLET EVERY DAY WITH OR IMMEDIATELY FOLLOWING A MEAL 90 tablet 3  . Multiple Vitamin (MULTIVITAMIN) tablet Take 1 tablet by mouth daily.      . tamoxifen (NOLVADEX) 20 MG tablet TAKE 1 TABLET EVERY DAY 90 tablet 2   No current facility-administered medications on file prior to visit.     Review of Systems    Constitutional: Positive for appetite change. Negative for fatigue and fever.  HENT: Positive for congestion, ear pain, postnasal drip, rhinorrhea, sinus pressure and sore throat. Negative for nosebleeds.   Eyes: Negative for pain, redness and itching.  Respiratory: Positive for cough. Negative for shortness of breath and wheezing.   Cardiovascular: Negative for chest pain.  Gastrointestinal: Negative for abdominal pain, diarrhea, nausea and vomiting.  Endocrine: Negative for polyuria.  Genitourinary: Negative for dysuria, frequency and urgency.  Musculoskeletal: Negative for arthralgias and myalgias.  Allergic/Immunologic: Negative for immunocompromised state.  Neurological: Positive for headaches. Negative for dizziness, tremors, syncope, weakness and numbness.  Hematological: Negative for adenopathy. Does not bruise/bleed easily.  Psychiatric/Behavioral: Negative for dysphoric mood. The patient is not nervous/anxious.        Objective:   Physical Exam  Constitutional: She appears well-developed and well-nourished. No distress.  Well appearing   HENT:  Head: Normocephalic and atraumatic.  Left Ear: External ear normal.  Mouth/Throat: Oropharynx is clear and moist. No oropharyngeal exudate.  Nares are injected and congested  Bilateral frontal sinus tenderness , also R maxillary TMs dull, R with eff and pink color (no bulging) Post nasal drip   Eyes: Conjunctivae and EOM are normal. Pupils are equal, round, and reactive to light. Right eye exhibits no discharge. Left eye exhibits no discharge.  Neck: Normal range of motion. Neck supple.  Cardiovascular: Normal rate and regular rhythm.   Pulmonary/Chest: Effort normal and breath sounds normal. No respiratory distress. She has no wheezes. She has no rales.  Good air exch  Lymphadenopathy:    She has no cervical adenopathy.  Neurological: She is alert. No cranial nerve deficit.  Skin: Skin is warm and dry. No rash noted.   Psychiatric: She has a normal mood and affect.          Assessment & Plan:   Problem List Items Addressed This Visit      Respiratory   Acute sinusitis    s/p uri  Frontal and R maxillary -also early OM on the R tx with augmentin as directed mucinex/fluids/saline/rest Update if not starting to improve in a week or if worsening        Relevant Medications   amoxicillin-clavulanate (AUGMENTIN) 875-125 MG tablet

## 2016-09-09 NOTE — Progress Notes (Signed)
Pre visit review using our clinic review tool, if applicable. No additional management support is needed unless otherwise documented below in the visit note. 

## 2016-09-09 NOTE — Assessment & Plan Note (Signed)
s/p uri  Frontal and R maxillary -also early OM on the R tx with augmentin as directed mucinex/fluids/saline/rest Update if not starting to improve in a week or if worsening

## 2016-09-09 NOTE — Patient Instructions (Signed)
Continue mucinex and ibuprofen as needed Drink lots of fluids Breathe steam  Nasal saline spray can be helpful also  Take the augmentin as directed for 7 days for sinus and early ear infection   Try to take it easy this weekend   Update if not starting to improve in a week or if worsening

## 2016-10-06 ENCOUNTER — Other Ambulatory Visit: Payer: Self-pay | Admitting: Family Medicine

## 2017-01-18 ENCOUNTER — Other Ambulatory Visit: Payer: Self-pay

## 2017-01-18 MED ORDER — TAMOXIFEN CITRATE 20 MG PO TABS: 20.0000 mg | ORAL_TABLET | Freq: Every day | ORAL | 2 refills | Status: DC

## 2017-02-15 ENCOUNTER — Other Ambulatory Visit (HOSPITAL_BASED_OUTPATIENT_CLINIC_OR_DEPARTMENT_OTHER): Payer: Medicare Other

## 2017-02-15 DIAGNOSIS — Z170 Estrogen receptor positive status [ER+]: Principal | ICD-10-CM

## 2017-02-15 DIAGNOSIS — C50511 Malignant neoplasm of lower-outer quadrant of right female breast: Secondary | ICD-10-CM

## 2017-02-15 LAB — COMPREHENSIVE METABOLIC PANEL
ALT: 27 U/L (ref 0–55)
AST: 26 U/L (ref 5–34)
Albumin: 4 g/dL (ref 3.5–5.0)
Alkaline Phosphatase: 55 U/L (ref 40–150)
Anion Gap: 10 mEq/L (ref 3–11)
BUN: 16.2 mg/dL (ref 7.0–26.0)
CHLORIDE: 105 meq/L (ref 98–109)
CO2: 25 meq/L (ref 22–29)
Calcium: 10.1 mg/dL (ref 8.4–10.4)
Creatinine: 0.9 mg/dL (ref 0.6–1.1)
EGFR: 63 mL/min/{1.73_m2} — ABNORMAL LOW (ref >90)
GLUCOSE: 82 mg/dL (ref 70–140)
POTASSIUM: 3.6 meq/L (ref 3.5–5.1)
SODIUM: 141 meq/L (ref 136–145)
Total Bilirubin: 0.5 mg/dL (ref 0.20–1.20)
Total Protein: 7.2 g/dL (ref 6.4–8.3)

## 2017-02-15 LAB — CBC WITH DIFFERENTIAL/PLATELET
BASO%: 1.2 % (ref 0.0–2.0)
BASOS ABS: 0.1 10*3/uL (ref 0.0–0.1)
EOS%: 3.1 % (ref 0.0–7.0)
Eosinophils Absolute: 0.2 10*3/uL (ref 0.0–0.5)
HCT: 37.4 % (ref 34.8–46.6)
HGB: 12.9 g/dL (ref 11.6–15.9)
LYMPH%: 27.2 % (ref 14.0–49.7)
MCH: 32.8 pg (ref 25.1–34.0)
MCHC: 34.5 g/dL (ref 31.5–36.0)
MCV: 95.1 fL (ref 79.5–101.0)
MONO#: 0.7 10*3/uL (ref 0.1–0.9)
MONO%: 10.4 % (ref 0.0–14.0)
NEUT#: 4 10*3/uL (ref 1.5–6.5)
NEUT%: 58.1 % (ref 38.4–76.8)
Platelets: 242 10*3/uL (ref 145–400)
RBC: 3.94 10*6/uL (ref 3.70–5.45)
RDW: 12.4 % (ref 11.2–14.5)
WBC: 6.9 10*3/uL (ref 3.9–10.3)
lymph#: 1.9 10*3/uL (ref 0.9–3.3)

## 2017-02-21 NOTE — Progress Notes (Signed)
[place]  [phone] [phone]     ID: Jamie Dillon DOB: [birth year]  MR#: [id number]  LPN#:[id number]  Patient Care Team: Abner Greenspan, MD as PCP - General Rolm Bookbinder, MD as Consulting Physician (General Surgery) Chaundra Abreu, Virgie Dad, MD as Consulting Physician (Oncology) Eppie Gibson, MD as Attending Physician (Radiation Oncology) Rockwell Germany, RN as Registered Nurse Mauro Kaufmann, RN as Registered Nurse Holley Bouche, NP as Nurse Practitioner (Nurse Practitioner) Sylvan Cheese, NP as Nurse Practitioner (Nurse Practitioner) OTHER MD:  CHIEF COMPLAINT:  Estrogen receptor positive breast cancer  CURRENT TREATMENT: Tamoxifen   BREAST CANCER HISTORY: From the original intake note:  Jamie Dillon had routine screening mammography at [facility] 06/05/2014. The breast density was category B. There was a 6 mm irregular mass in the right breast. On 06/11/2014 the patient underwent right breast ultrasonography at [facility]. This confirmed a 7 mm all her than wide irregular mass in the right blast at the 8:00 position. 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  The patient's subsequent history is as detailed below  INTERVAL HISTORY: Jamie Dillon returns today for follow-up of her estrogen receptor positive breast cancer. She continues on tamoxifen, which she is tolerating generally well. Pt has had mild hot flashes, but none that were not bearable. She has had mild hair loss while taking tamoxifen.   She has residual pain to her right breast where the previous surgical site was located. She described her pain as an intermittent shooting sensation and notes mild  daily discomfort. This is a source of concern to her  She was able to do the 5K walk with her daughter. Her son, who coaches a soccer team has. The breast cancer low on the unit forms in her on   REVIEW OF SYSTEMS: Jamie Dillon reports slightly increased fatigue. She still gets on the treadmill about 30 minutes most days. She denies unusual headaches, visual changes, nausea, vomiting, or dizziness. There has been no unusual cough, phlegm production, or pleurisy. This been no change in bowel or bladder habits. She denies unexplained weight loss, bleeding, rash, or fever. A detailed review of systems was otherwise negative.     PAST MEDICAL HISTORY: Past Medical History:  Diagnosis Date  . Allergic rhinitis   . Anxiety   . Breast cancer of lower-outer quadrant of right female breast ([place]) 06/19/2014  . Disorder of bone and cartilage, unspecified   . Family history of malignant neoplasm of breast   . Family history of other kidney diseases   . HLD (hyperlipidemia)   . HTN (hypertension)   . PONV (postoperative nausea and vomiting)   . Tobacco abuse     PAST SURGICAL HISTORY: Past Surgical History:  Procedure Laterality Date  . ABDOMINAL HYSTERECTOMY    . APPENDECTOMY    . BREAST LUMPECTOMY WITH NEEDLE LOCALIZATION AND AXILLARY SENTINEL LYMPH NODE BX Right 07/21/2014   Procedure: BREAST LUMPECTOMY WITH NEEDLE LOCALIZATION AND AXILLARY SENTINEL LYMPH NODE BIOPSY;  Surgeon: Rolm Bookbinder, MD;  Location: [facility];  Service: General;  Laterality: Right;  . COLONOSCOPY    . DILATION AND CURETTAGE OF UTERUS    . PORT-A-CATH REMOVAL N/A 03/12/2015   Procedure: REMOVAL PORT-A-CATH;  Surgeon: Rolm Bookbinder, MD;  Location:  [place];  Service: General;  Laterality: N/A;  . PORTACATH PLACEMENT Right 09/10/2014   Procedure: INSERTION PORT-A-CATH;  Surgeon: Rolm Bookbinder, MD;  Location: [facility];  Service: General;  Laterality: Right;  . TOTAL VAGINAL HYSTERECTOMY   1996   endometriosis    FAMILY HISTORY Family History  Problem Relation Age of Onset  . Hypertension Mother   . Kidney disease Father   . Breast cancer Sister 76  . Diabetes Paternal Aunt    the patient's father died from kidney failure at the age of 79. The patient's mother is still living at age 79. The patient had no brothers, one sister. That sister was diagnosed with breast cancer at age 40. The patient's father's mother was diagnosed with mouth cancer at the age of 105.  GYNECOLOGIC HISTORY:  No LMP recorded. Patient has had a hysterectomy. Menarche age 14, first live birth age 62. The patient is GX P2. She had a total abdominal hysterectomy with bilateral salpingo-oophorectomy in 1996. She took hormone replacement for approximately 10 years, until 2009. She also took oral contraceptives for approximately 7 years remotely, with no complications  SOCIAL HISTORY:  The patient is currently retired, though she still works part-time at the [employer] Her husband "[name]" Jla Reynolds is retired from Press photographer. Son [name] "[name]" Sarver teaches English at [employer] in [place]. Daughter [name] lives in [place] . She works as a Marine scientist at the [employer]: In [place]: Social History  Substance Use Topics  . Smoking status: Former Smoker    Packs/day: 1.00    Types: Cigarettes    Quit date: 06/25/2010  . Smokeless tobacco: Never Used  . Alcohol use 0.0 oz/week     Comment: occasional wine     Colonoscopy: 2010  PAP:  Bone density: December 2015  Lipid panel:  Allergies  Allergen Reactions  . Dust Mite Extract   . Pollen Extract     Current Outpatient Prescriptions  Medication Sig Dispense Refill  . ALPRAZolam (XANAX) 0.5 MG tablet TAKE 1 TABLET EVERY DAY AS NEEDED 30 tablet 0  . amoxicillin-clavulanate (AUGMENTIN) 875-125 MG tablet Take 1 tablet by mouth 2 (two) times daily. 14  tablet 0  . atorvastatin (LIPITOR) 10 MG tablet Take 1 tablet (10 mg total) by mouth daily. 90 tablet 3  . b complex vitamins tablet Take 1 tablet by mouth daily.     . Calcium Carbonate-Vitamin D (CALCIUM-VITAMIN D) 500-200 MG-UNIT per tablet Take 3 tablets by mouth every morning.      . diphenhydrAMINE (BENADRYL) 25 MG tablet Take 25 mg by mouth daily as needed for allergies.     Marland Kitchen lisinopril-hydrochlorothiazide (PRINZIDE,ZESTORETIC) 20-25 MG tablet Take 1 tablet by mouth daily. 90 tablet 3  . metoprolol succinate (TOPROL-XL) 50 MG 24 hr tablet TAKE 1 TABLET EVERY DAY WITH OR IMMEDIATELY FOLLOWING A MEAL 90 tablet 3  . Multiple Vitamin (MULTIVITAMIN) tablet Take 1 tablet by mouth daily.      . tamoxifen (NOLVADEX) 20 MG tablet Take 1 tablet (20 mg total) by mouth daily. 90 tablet 2   No current facility-administered medications for this visit.     OBJECTIVE: Middle-aged white woman In no acute distress  Vitals:   02/22/17 1320  BP: 140/77  Pulse: 95  Resp: 18  Temp: 98.2 F (36.8 C)  SpO2: 100%     Body mass index is 25.96 kg/m.  ECOG FS:0 - Asymptomatic  Sclerae unicteric, EOMs intact Oropharynx clear and moist No cervical or supraclavicular adenopathy Lungs no rales or rhonchi Heart regular rate and rhythm Abd soft, nontender, positive bowel sounds MSK no focal spinal tenderness, no upper extremity lymphedema Neuro: nonfocal, well oriented, appropriate affect Breasts: The right breast is status post lumpectomy followed by radiation. There is no evidence of local recurrence. The cosmetic result is excellent. The left breast is benign. Both axillae are benign.  LAB RESULTS:  CMP     Component Value Date/Time   NA 141 02/15/2017 1149   K 3.6 02/15/2017 1149   CL 106 04/22/2016 0831   CO2 25 02/15/2017 1149   GLUCOSE 82 02/15/2017 1149   BUN 16.2 02/15/2017 1149   CREATININE 0.9 02/15/2017 1149   CALCIUM 10.1 02/15/2017 1149   PROT 7.2 02/15/2017 1149   ALBUMIN 4.0  02/15/2017 1149   AST 26 02/15/2017 1149   ALT 27 02/15/2017 1149   ALKPHOS 55 02/15/2017 1149   BILITOT 0.50 02/15/2017 1149   GFRNONAA >60 03/09/2015 0847   GFRAA >60 03/09/2015 0847    INo results found for: SPEP, UPEP  Lab Results  Component Value Date   WBC 6.9 02/15/2017   NEUTROABS 4.0 02/15/2017   HGB 12.9 02/15/2017   HCT 37.4 02/15/2017   MCV 95.1 02/15/2017   PLT 242 02/15/2017      Chemistry      Component Value Date/Time   NA 141 02/15/2017 1149   K 3.6 02/15/2017 1149   CL 106 04/22/2016 0831   CO2 25 02/15/2017 1149   BUN 16.2 02/15/2017 1149   CREATININE 0.9 02/15/2017 1149      Component Value Date/Time   CALCIUM 10.1 02/15/2017 1149   ALKPHOS 55 02/15/2017 1149   AST 26 02/15/2017 1149   ALT 27 02/15/2017 1149   BILITOT 0.50 02/15/2017 1149       No results found for: LABCA2  No components found for: LABCA125  No results for input(s): INR in the last 168 hours.  Urinalysis    Component Value Date/Time   COLORURINE YELLOW 09/21/2014 1809   APPEARANCEUR CLOUDY (A) 09/21/2014 1809   LABSPEC 1.009 09/21/2014 1809   PHURINE 7.0 09/21/2014 1809   GLUCOSEU NEGATIVE 09/21/2014 1809   HGBUR MODERATE (A) 09/21/2014 1809   HGBUR negative 02/13/2008 0911   BILIRUBINUR Neg. 10/15/2014 1248   KETONESUR NEGATIVE 09/21/2014 1809   PROTEINUR Neg. 10/15/2014 1248   PROTEINUR 30 (A) 09/21/2014 1809   UROBILINOGEN 0.2 10/15/2014 1248   UROBILINOGEN 0.2 09/21/2014 1809   NITRITE Neg. 10/15/2014 1248   NITRITE POSITIVE (A) 09/21/2014 1809   LEUKOCYTESUR Negative 10/15/2014 1248    STUDIES: Bilateral diagnostic mammography at [facility] 07/26/2016 from the breast density to be category B. There was no evidence of malignancy.  ASSESSMENT: 66 y.o. [place] woman status post right breastLower outer quadrant biopsy 06/17/2014 for a clinical T1b N0, stage I invasive ductal carcinoma, grade 2, estrogen receptor strongly positive, progesterone receptor  moderately positive, with no HER-2 amplification and an MIB-1 of 22%.  (1) status post right lumpectomy and right axillary sentinel lymph node sampling 07/21/2014 for a pT1b pN1a, stage IIA invasive ductal carcinoma, grade 1, with negative margins, repeat HER-2/neu again negative.  (2) adjuvant chemotherapy consisting of cyclophosphamide and docetaxel started 09/15/2014, given every 3 weeks with Neulasta support, completed 11/18/2014  (a) Neutropenic fever and hospitalization 5 days after the first cycle: subsequent cycles 10% dose reduced  (3) adjuvant  radiation 12/10/2014-01/21/2015: Right breast/ axilla // 50 Gy in 25 fractions Right breast boost / 10 Gy in 5 fractions  (4) started tamoxifen 02/14/2015  (5) osteopenia: bone density January 2016 shows a T score of -1.1 at the femoral neck  PLAN: Jamie Dillon is now a bit over 2 years out from definitive surgery for her breast cancer with no evidence of disease recurrence. This is very favorable.  She is tolerating the tamoxifen well and the plan will be to continue that medication for a total of 5 years.  I reassured her that shooting pains, sensitivity, and soreness in the surgical breast are common and benign.  I encouraged her to continue to exercise regularly and if possible improve her exercise routine  Otherwise she will have her next mammography in March 2019, C Dr. Donne Hazel again shortly after that, and see me again a year from now  She knows to call for any problems that may develop before that visit.   Chauncey Cruel, MD 02/22/17 1:45 PM Medical Oncology [facility] [address] [place], [place] [address] Tel. [phone] Fax. [phone]  This document serves as a record of services personally performed by Lurline Del, MD. It was created on her behalf by Steva Colder, a trained medical scribe. The creation of this record is based on the scribe's personal observations and the  provider's statements to them. This document has been checked and approved by the attending provider.

## 2017-02-22 ENCOUNTER — Ambulatory Visit (HOSPITAL_BASED_OUTPATIENT_CLINIC_OR_DEPARTMENT_OTHER): Payer: Medicare Other | Admitting: Oncology

## 2017-02-22 VITALS — BP 140/77 | HR 95 | Temp 98.2°F | Resp 18 | Ht 61.0 in | Wt 137.4 lb

## 2017-02-22 DIAGNOSIS — Z79810 Long term (current) use of selective estrogen receptor modulators (SERMs): Secondary | ICD-10-CM

## 2017-02-22 DIAGNOSIS — C50511 Malignant neoplasm of lower-outer quadrant of right female breast: Secondary | ICD-10-CM

## 2017-02-22 DIAGNOSIS — M8580 Other specified disorders of bone density and structure, unspecified site: Secondary | ICD-10-CM | POA: Diagnosis not present

## 2017-02-22 DIAGNOSIS — Z170 Estrogen receptor positive status [ER+]: Secondary | ICD-10-CM | POA: Diagnosis not present

## 2017-02-24 ENCOUNTER — Telehealth: Payer: Self-pay | Admitting: Oncology

## 2017-02-24 NOTE — Telephone Encounter (Signed)
Gave patient AVs and calendar of upcoming appointments.  °

## 2017-03-06 ENCOUNTER — Other Ambulatory Visit: Payer: Self-pay | Admitting: Family Medicine

## 2017-03-06 NOTE — Telephone Encounter (Signed)
Rx called in as prescribed 

## 2017-03-06 NOTE — Telephone Encounter (Signed)
CPE scheduled for 05/12/17, last filled on 07/26/16 #30 tabs with 0 refills, please advise

## 2017-03-06 NOTE — Telephone Encounter (Signed)
Px written for call in   

## 2017-04-27 ENCOUNTER — Telehealth: Payer: Self-pay | Admitting: Family Medicine

## 2017-04-27 DIAGNOSIS — E7800 Pure hypercholesterolemia, unspecified: Secondary | ICD-10-CM

## 2017-04-27 DIAGNOSIS — I10 Essential (primary) hypertension: Secondary | ICD-10-CM

## 2017-04-27 NOTE — Telephone Encounter (Signed)
-----   Message from Ellamae Sia sent at 04/27/2017  3:44 PM EST ----- Regarding: Lab orders for Friday, 12.21.18 Patient is scheduled for CPX labs, please order future labs, Thanks , Karna Christmas

## 2017-05-04 ENCOUNTER — Ambulatory Visit: Payer: Self-pay

## 2017-05-05 ENCOUNTER — Other Ambulatory Visit (INDEPENDENT_AMBULATORY_CARE_PROVIDER_SITE_OTHER): Payer: Medicare Other

## 2017-05-05 DIAGNOSIS — E7800 Pure hypercholesterolemia, unspecified: Secondary | ICD-10-CM

## 2017-05-05 DIAGNOSIS — I10 Essential (primary) hypertension: Secondary | ICD-10-CM

## 2017-05-05 LAB — COMPREHENSIVE METABOLIC PANEL
ALBUMIN: 4 g/dL (ref 3.5–5.2)
ALT: 20 U/L (ref 0–35)
AST: 22 U/L (ref 0–37)
Alkaline Phosphatase: 46 U/L (ref 39–117)
BILIRUBIN TOTAL: 0.4 mg/dL (ref 0.2–1.2)
BUN: 25 mg/dL — ABNORMAL HIGH (ref 6–23)
CALCIUM: 9.2 mg/dL (ref 8.4–10.5)
CO2: 27 mEq/L (ref 19–32)
CREATININE: 0.99 mg/dL (ref 0.40–1.20)
Chloride: 104 mEq/L (ref 96–112)
GFR: 59.26 mL/min — ABNORMAL LOW (ref >60.00)
Glucose, Bld: 89 mg/dL (ref 70–99)
Potassium: 3.6 mEq/L (ref 3.5–5.1)
Sodium: 140 mEq/L (ref 135–145)
Total Protein: 7.2 g/dL (ref 6.0–8.3)

## 2017-05-05 LAB — TSH: TSH: 2.21 u[IU]/mL (ref 0.35–4.50)

## 2017-05-05 LAB — CBC WITH DIFFERENTIAL/PLATELET
BASOS ABS: 0.1 10*3/uL (ref 0.0–0.1)
Basophils Relative: 1.1 % (ref 0.0–3.0)
EOS ABS: 0.2 10*3/uL (ref 0.0–0.7)
Eosinophils Relative: 3.4 % (ref 0.0–5.0)
HEMATOCRIT: 37.3 % (ref 36.0–46.0)
HEMOGLOBIN: 12.7 g/dL (ref 12.0–15.0)
LYMPHS PCT: 22.4 % (ref 12.0–46.0)
Lymphs Abs: 1.6 10*3/uL (ref 0.7–4.0)
MCHC: 34.1 g/dL (ref 30.0–36.0)
MCV: 96.5 fl (ref 78.0–100.0)
MONO ABS: 0.7 10*3/uL (ref 0.1–1.0)
Monocytes Relative: 9.2 % (ref 3.0–12.0)
NEUTROS ABS: 4.5 10*3/uL (ref 1.4–7.7)
Neutrophils Relative %: 63.9 % (ref 43.0–77.0)
PLATELETS: 258 10*3/uL (ref 150.0–400.0)
RBC: 3.87 Mil/uL (ref 3.87–5.11)
RDW: 12.8 % (ref 11.5–15.5)
WBC: 7.1 10*3/uL (ref 4.0–10.5)

## 2017-05-05 LAB — LIPID PANEL
CHOLESTEROL: 128 mg/dL (ref 0–200)
HDL: 39.7 mg/dL (ref >39.00)
LDL Cholesterol: 60 mg/dL (ref 0–99)
NonHDL: 88.17
TRIGLYCERIDES: 140 mg/dL (ref 0.0–149.0)
Total CHOL/HDL Ratio: 3
VLDL: 28 mg/dL (ref 0.0–40.0)

## 2017-05-12 ENCOUNTER — Encounter: Payer: Self-pay | Admitting: Family Medicine

## 2017-05-12 ENCOUNTER — Ambulatory Visit (INDEPENDENT_AMBULATORY_CARE_PROVIDER_SITE_OTHER): Payer: Medicare Other | Admitting: Family Medicine

## 2017-05-12 VITALS — BP 128/70 | HR 74 | Temp 98.8°F | Ht 60.5 in | Wt 135.8 lb

## 2017-05-12 DIAGNOSIS — Z170 Estrogen receptor positive status [ER+]: Secondary | ICD-10-CM

## 2017-05-12 DIAGNOSIS — I10 Essential (primary) hypertension: Secondary | ICD-10-CM | POA: Diagnosis not present

## 2017-05-12 DIAGNOSIS — E7800 Pure hypercholesterolemia, unspecified: Secondary | ICD-10-CM

## 2017-05-12 DIAGNOSIS — C50511 Malignant neoplasm of lower-outer quadrant of right female breast: Secondary | ICD-10-CM | POA: Diagnosis not present

## 2017-05-12 DIAGNOSIS — Z0000 Encounter for general adult medical examination without abnormal findings: Secondary | ICD-10-CM | POA: Diagnosis not present

## 2017-05-12 DIAGNOSIS — F430 Acute stress reaction: Secondary | ICD-10-CM

## 2017-05-12 DIAGNOSIS — M8580 Other specified disorders of bone density and structure, unspecified site: Secondary | ICD-10-CM | POA: Diagnosis not present

## 2017-05-12 DIAGNOSIS — E2839 Other primary ovarian failure: Secondary | ICD-10-CM

## 2017-05-12 MED ORDER — ATORVASTATIN CALCIUM 10 MG PO TABS: 10.0000 mg | ORAL_TABLET | Freq: Every day | ORAL | 3 refills | Status: DC

## 2017-05-12 MED ORDER — LISINOPRIL-HYDROCHLOROTHIAZIDE 20-25 MG PO TABS: 1.0000 | ORAL_TABLET | Freq: Every day | ORAL | 3 refills | Status: DC

## 2017-05-12 MED ORDER — METOPROLOL SUCCINATE ER 50 MG PO TB24: ORAL_TABLET | ORAL | 3 refills | Status: DC

## 2017-05-12 MED ORDER — ALPRAZOLAM 0.5 MG PO TABS: ORAL_TABLET | ORAL | 0 refills | Status: DC

## 2017-05-12 NOTE — Patient Instructions (Addendum)
Drink more water for kidney health  Aim for 64 oz daily of fluid/ mostly water   Keep exercising  Take care of yourself  We will refer you for a bone density test

## 2017-05-12 NOTE — Assessment & Plan Note (Signed)
bp in fair control at this time  BP Readings from Last 1 Encounters:  05/12/17 128/70   No changes needed Disc lifstyle change with low sodium diet and exercise  Medicines renewed  Labs rev  Enc exercise

## 2017-05-12 NOTE — Assessment & Plan Note (Signed)
occ use of xanax Refilled #30  Disc habit/sedation

## 2017-05-12 NOTE — Assessment & Plan Note (Signed)
Disc goals for lipids and reasons to control them Rev labs with pt Rev low sat fat diet in detail Fairly controlled with atorvastatin and diet

## 2017-05-12 NOTE — Assessment & Plan Note (Signed)
Reviewed health habits including diet and exercise and skin cancer prevention Reviewed appropriate screening tests for age  Also reviewed health mt list, fam hx and immunization status , as well as social and family history   See HPI Labs rev  dexa ordered  

## 2017-05-12 NOTE — Assessment & Plan Note (Signed)
2 y dexa scheduled  No falls or fx  On ca and D On tamoxifen-inc risk of OP/disc

## 2017-05-12 NOTE — Assessment & Plan Note (Signed)
Doing well/tolerating tamoxifen Continue oncol and surg f/u Nl exam today

## 2017-05-12 NOTE — Progress Notes (Signed)
Subjective:    Patient ID: Jamie Dillon, female    DOB: Jul 31, 1950, 66 y.o.   MRN: [id number]  HPI  Here for health maintenance exam and to review chronic medical problems    Feeling pretty good  Hurt back a week ago - better now  Usually exercises regularly    Wt Readings from Last 3 Encounters:  05/12/17 135 lb 12 oz (61.6 kg)  02/22/17 137 lb 6.4 oz (62.3 kg)  09/09/16 137 lb 8 oz (62.4 kg)  wt is stable- she does well mt her wt  Exercise - regular Eating healthy except at holiday  26.08 kg/m   Mammogram 3/18- nl  Personal hx of R breast cancer - tamoxifen (tolerates side effects/ low energy)  Sister had breast cancer at 57 Self breast exam - no lumps  Saw Dr Jana Hakim in Oct and surgeon in May   Colonoscopy 11/10 10 year recall   Tetanus vaccine 10/12  dexa 1/16 mild osteopenia  Is ready to do another one  Takes her ca and D   Has adv directive -living will and poa  Memory- fair / occ has word on the tip of her tongue   PNA vaccines complete Flu vaccine 9/18  Zoster status   Vaccine 1/14  bp is stable today  No cp or palpitations or headaches or edema  No side effects to medicines  BP Readings from Last 3 Encounters:  05/12/17 128/70  02/22/17 140/77  09/09/16 132/76      Pulse Readings from Last 3 Encounters:  05/12/17 74  02/22/17 95  09/09/16 84   Hyperlipidemia Lab Results  Component Value Date   CHOL 128 05/05/2017   CHOL 143 04/22/2016   CHOL 148 04/20/2015   Lab Results  Component Value Date   HDL 39.70 05/05/2017   HDL 40.20 04/22/2016   HDL 38.70 (L) 04/20/2015   Lab Results  Component Value Date   LDLCALC 60 05/05/2017   LDLCALC 67 04/22/2016   LDLCALC 77 04/20/2015   Lab Results  Component Value Date   TRIG 140.0 05/05/2017   TRIG 176.0 (H) 04/22/2016   TRIG 162.0 (H) 04/20/2015   Lab Results  Component Value Date   CHOLHDL 3 05/05/2017   CHOLHDL 4 04/22/2016   CHOLHDL 4 04/20/2015   Lab Results  Component  Value Date   LDLDIRECT 127.3 03/01/2011   On atorvastatin and diet  Improved from last year  Does exercise / HDL is low   Other labs: Results for orders placed or performed in visit on 05/05/17  TSH  Result Value Ref Range   TSH 2.21 0.35 - 4.50 uIU/mL  Lipid panel  Result Value Ref Range   Cholesterol 128 0 - 200 mg/dL   Triglycerides 140.0 0.0 - 149.0 mg/dL   HDL 39.70 >39.00 mg/dL   VLDL 28.0 0.0 - 40.0 mg/dL   LDL Cholesterol 60 0 - 99 mg/dL   Total CHOL/HDL Ratio 3    NonHDL 88.17   Comprehensive metabolic panel  Result Value Ref Range   Sodium 140 135 - 145 mEq/L   Potassium 3.6 3.5 - 5.1 mEq/L   Chloride 104 96 - 112 mEq/L   CO2 27 19 - 32 mEq/L   Glucose, Bld 89 70 - 99 mg/dL   BUN 25 (H) 6 - 23 mg/dL   Creatinine, Ser 0.99 0.40 - 1.20 mg/dL   Total Bilirubin 0.4 0.2 - 1.2 mg/dL   Alkaline Phosphatase 46 39 - 117 U/L  AST 22 0 - 37 U/L   ALT 20 0 - 35 U/L   Total Protein 7.2 6.0 - 8.3 g/dL   Albumin 4.0 3.5 - 5.2 g/dL   Calcium 9.2 8.4 - 10.5 mg/dL   GFR 59.26 (L) >60.00 mL/min  CBC with Differential/Platelet  Result Value Ref Range   WBC 7.1 4.0 - 10.5 K/uL   RBC 3.87 3.87 - 5.11 Mil/uL   Hemoglobin 12.7 12.0 - 15.0 g/dL   HCT 37.3 36.0 - 46.0 %   MCV 96.5 78.0 - 100.0 fl   MCHC 34.1 30.0 - 36.0 g/dL   RDW 12.8 11.5 - 15.5 %   Platelets 258.0 150.0 - 400.0 K/uL   Neutrophils Relative % 63.9 43.0 - 77.0 %   Lymphocytes Relative 22.4 12.0 - 46.0 %   Monocytes Relative 9.2 3.0 - 12.0 %   Eosinophils Relative 3.4 0.0 - 5.0 %   Basophils Relative 1.1 0.0 - 3.0 %   Neutro Abs 4.5 1.4 - 7.7 K/uL   Lymphs Abs 1.6 0.7 - 4.0 K/uL   Monocytes Absolute 0.7 0.1 - 1.0 K/uL   Eosinophils Absolute 0.2 0.0 - 0.7 K/uL   Basophils Absolute 0.1 0.0 - 0.1 K/uL     Patient Active Problem List   Diagnosis Date Noted  . Osteopenia 05/12/2017  . Sinus tachycardia 09/21/2014  . Anxiety 07/08/2014  . Malignant neoplasm of lower-outer quadrant of right breast of female,  estrogen receptor positive ([place]) 06/19/2014  . Encounter for Medicare annual wellness exam 04/25/2014  . Estrogen deficiency 04/25/2014  . Stress reaction 01/03/2014  . Palpitations 01/03/2014  . Routine general medical examination at a health care facility 02/26/2011  . HYPERCHOLESTEROLEMIA, PURE 02/01/2007  . Essential hypertension 09/08/2006  . ALLERGIC RHINITIS 09/08/2006  . TOBACCO ABUSE, HX OF 09/08/2006   Past Medical History:  Diagnosis Date  . Allergic rhinitis   . Anxiety   . Breast cancer of lower-outer quadrant of right female breast ([place]) 06/19/2014  . Disorder of bone and cartilage, unspecified   . Family history of malignant neoplasm of breast   . Family history of other kidney diseases   . HLD (hyperlipidemia)   . HTN (hypertension)   . PONV (postoperative nausea and vomiting)   . Tobacco abuse    Past Surgical History:  Procedure Laterality Date  . ABDOMINAL HYSTERECTOMY    . APPENDECTOMY    . BREAST LUMPECTOMY WITH NEEDLE LOCALIZATION AND AXILLARY SENTINEL LYMPH NODE BX Right 07/21/2014   Procedure: BREAST LUMPECTOMY WITH NEEDLE LOCALIZATION AND AXILLARY SENTINEL LYMPH NODE BIOPSY;  Surgeon: Rolm Bookbinder, MD;  Location: [facility];  Service: General;  Laterality: Right;  . COLONOSCOPY    . DILATION AND CURETTAGE OF UTERUS    . PORT-A-CATH REMOVAL N/A 03/12/2015   Procedure: REMOVAL PORT-A-CATH;  Surgeon: Rolm Bookbinder, MD;  Location: [facility];  Service: General;  Laterality: N/A;  . PORTACATH PLACEMENT Right 09/10/2014   Procedure: INSERTION PORT-A-CATH;  Surgeon: Rolm Bookbinder, MD;  Location: [facility];  Service: General;  Laterality: Right;  . TOTAL VAGINAL HYSTERECTOMY  1996   endometriosis   Social History   Tobacco Use  . Smoking status: Former Smoker    Packs/day: 1.00    Types: Cigarettes    Last attempt to quit: 06/25/2010    Years since quitting: 6.8  . Smokeless tobacco: Never Used  Substance Use Topics  .  Alcohol use: Yes    Alcohol/week: 0.0 oz  Comment: occasional wine  . Drug use: No   Family History  Problem Relation Age of Onset  . Hypertension Mother   . Kidney disease Father   . Breast cancer Sister 27  . Diabetes Paternal Aunt    Allergies  Allergen Reactions  . Dust Mite Extract   . Pollen Extract    Current Outpatient Medications on File Prior to Visit  Medication Sig Dispense Refill  . b complex vitamins tablet Take 1 tablet by mouth daily.     . Calcium Carbonate-Vitamin D (CALCIUM-VITAMIN D) 500-200 MG-UNIT per tablet Take 3 tablets by mouth every morning.      . diphenhydrAMINE (BENADRYL) 25 MG tablet Take 25 mg by mouth daily as needed for allergies.     . Multiple Vitamin (MULTIVITAMIN) tablet Take 1 tablet by mouth daily.      . tamoxifen (NOLVADEX) 20 MG tablet Take 1 tablet (20 mg total) by mouth daily. 90 tablet 2   No current facility-administered medications on file prior to visit.      Review of Systems  Constitutional: Negative for activity change, appetite change, fatigue, fever and unexpected weight change.  HENT: Negative for congestion, ear pain, rhinorrhea, sinus pressure and sore throat.   Eyes: Negative for pain, redness and visual disturbance.  Respiratory: Negative for cough, shortness of breath and wheezing.   Cardiovascular: Negative for chest pain and palpitations.  Gastrointestinal: Negative for abdominal pain, blood in stool, constipation and diarrhea.  Endocrine: Negative for polydipsia and polyuria.  Genitourinary: Negative for dysuria, frequency and urgency.  Musculoskeletal: Positive for back pain. Negative for arthralgias and myalgias.       Now improved   Skin: Negative for pallor and rash.  Allergic/Immunologic: Negative for environmental allergies.  Neurological: Negative for dizziness, syncope and headaches.  Hematological: Negative for adenopathy. Does not bruise/bleed easily.  Psychiatric/Behavioral: Negative for decreased  concentration and dysphoric mood. The patient is not nervous/anxious.        Objective:   Physical Exam  Constitutional: She appears well-developed and well-nourished. No distress.  Well appearing   HENT:  Head: Normocephalic and atraumatic.  Right Ear: External ear normal.  Left Ear: External ear normal.  Mouth/Throat: Oropharynx is clear and moist.  Eyes: Conjunctivae and EOM are normal. Pupils are equal, round, and reactive to light. No scleral icterus.  Neck: Normal range of motion. Neck supple. No JVD present. Carotid bruit is not present. No thyromegaly present.  Cardiovascular: Normal rate, regular rhythm, normal heart sounds and intact distal pulses. Exam reveals no gallop.  Pulmonary/Chest: Effort normal and breath sounds normal. No respiratory distress. She has no wheezes. She exhibits no tenderness.  Abdominal: Soft. Bowel sounds are normal. She exhibits no distension, no abdominal bruit and no mass. There is no tenderness.  Genitourinary: No breast swelling, tenderness, discharge or bleeding.  Genitourinary Comments: Breast exam: No mass, nodules, thickening, tenderness, bulging, retraction, inflamation, nipple discharge or skin changes noted.  No axillary or clavicular LA.    Surgical changes baseline R    Musculoskeletal: Normal range of motion. She exhibits no edema or tenderness.  No kyphosis   Lymphadenopathy:    She has no cervical adenopathy.  Neurological: She is alert. She has normal reflexes. No cranial nerve deficit. She exhibits normal muscle tone. Coordination normal.  Skin: Skin is warm and dry. No rash noted. No erythema. No pallor.  Solar lentigines diffusely   Psychiatric: She has a normal mood and affect.  Assessment & Plan:   Problem List Items Addressed This Visit      Cardiovascular and Mediastinum   Essential hypertension    bp in fair control at this time  BP Readings from Last 1 Encounters:  05/12/17 128/70   No changes  needed Disc lifstyle change with low sodium diet and exercise  Medicines renewed  Labs rev  Enc exercise       Relevant Medications   metoprolol succinate (TOPROL-XL) 50 MG 24 hr tablet   lisinopril-hydrochlorothiazide (PRINZIDE,ZESTORETIC) 20-25 MG tablet   atorvastatin (LIPITOR) 10 MG tablet     Musculoskeletal and Integument   Osteopenia    2 y dexa scheduled  No falls or fx  On ca and D On tamoxifen-inc risk of OP/disc        Other   Estrogen deficiency   Relevant Orders   DG Bone Density   HYPERCHOLESTEROLEMIA, PURE    Disc goals for lipids and reasons to control them Rev labs with pt Rev low sat fat diet in detail Fairly controlled with atorvastatin and diet       Relevant Medications   metoprolol succinate (TOPROL-XL) 50 MG 24 hr tablet   lisinopril-hydrochlorothiazide (PRINZIDE,ZESTORETIC) 20-25 MG tablet   atorvastatin (LIPITOR) 10 MG tablet   Malignant neoplasm of lower-outer quadrant of right breast of female, estrogen receptor positive ([place])    Doing well/tolerating tamoxifen Continue oncol and surg f/u Nl exam today      Relevant Medications   ALPRAZolam (XANAX) 0.5 MG tablet   Routine general medical examination at a health care facility - Primary    Reviewed health habits including diet and exercise and skin cancer prevention Reviewed appropriate screening tests for age  Also reviewed health mt list, fam hx and immunization status , as well as social and family history   See HPI Labs rev  dexa ordered        Stress reaction    occ use of xanax Refilled #30  Disc habit/sedation       Relevant Medications   ALPRAZolam (XANAX) 0.5 MG tablet

## 2017-05-23 ENCOUNTER — Ambulatory Visit (INDEPENDENT_AMBULATORY_CARE_PROVIDER_SITE_OTHER)
Admission: RE | Admit: 2017-05-23 | Discharge: 2017-05-23 | Disposition: A | Discharge status: Home / Self Care | Payer: Medicare Other | Source: Ambulatory Visit | Attending: Family Medicine | Admitting: Family Medicine

## 2017-05-23 DIAGNOSIS — E2839 Other primary ovarian failure: Secondary | ICD-10-CM

## 2017-06-21 ENCOUNTER — Ambulatory Visit (INDEPENDENT_AMBULATORY_CARE_PROVIDER_SITE_OTHER): Payer: Medicare Other | Admitting: Family Medicine

## 2017-06-21 ENCOUNTER — Ambulatory Visit (INDEPENDENT_AMBULATORY_CARE_PROVIDER_SITE_OTHER)
Admission: RE | Admit: 2017-06-21 | Discharge: 2017-06-21 | Disposition: A | Discharge status: Home / Self Care | Payer: Medicare Other | Source: Ambulatory Visit | Attending: Family Medicine | Admitting: Family Medicine

## 2017-06-21 ENCOUNTER — Encounter: Payer: Self-pay | Admitting: Family Medicine

## 2017-06-21 VITALS — BP 136/80 | HR 103 | Temp 99.1°F | Ht 60.5 in | Wt 135.2 lb

## 2017-06-21 DIAGNOSIS — M545 Low back pain, unspecified: Secondary | ICD-10-CM

## 2017-06-21 DIAGNOSIS — C50511 Malignant neoplasm of lower-outer quadrant of right female breast: Secondary | ICD-10-CM | POA: Diagnosis not present

## 2017-06-21 DIAGNOSIS — Z170 Estrogen receptor positive status [ER+]: Secondary | ICD-10-CM | POA: Diagnosis not present

## 2017-06-21 MED ORDER — METHOCARBAMOL 500 MG PO TABS: 500.0000 mg | ORAL_TABLET | Freq: Three times a day (TID) | ORAL | 0 refills | Status: DC | PRN

## 2017-06-21 NOTE — Progress Notes (Signed)
Subjective:    Patient ID: Jamie Dillon, female    DOB: 06-14-1950, 67 y.o.   MRN: [id number]  HPI Here for back pain for 6 weeks with spasms   It has gotten better and then worse  Muscle spasms- from 2-4 times per day lasting less than a minute  They catch her and she freezes  Left low back and buttocks (does not shoot down her leg)- it used to but no longer  In between spasms- ache all the way across   No n/t now (did burn originally)  No weakness  No sudden loss of b/b control   Symptoms worsen- taking first step after sleeping or sitting  Better walking around / reg activity during the day  In fact gets better as the day goes on  Only one spasm in bed   Taking ibuprofen otc and it helped the pain    Wt Readings from Last 3 Encounters:  06/21/17 135 lb 4 oz (61.3 kg)  05/12/17 135 lb 12 oz (61.6 kg)  02/22/17 137 lb 6.4 oz (62.3 kg)   25.98 kg/m   Patient Active Problem List   Diagnosis Date Noted  . Low back pain 06/21/2017  . Osteopenia 05/12/2017  . Sinus tachycardia 09/21/2014  . Anxiety 07/08/2014  . Malignant neoplasm of lower-outer quadrant of right breast of female, estrogen receptor positive ([place]) 06/19/2014  . Encounter for Medicare annual wellness exam 04/25/2014  . Estrogen deficiency 04/25/2014  . Stress reaction 01/03/2014  . Palpitations 01/03/2014  . Routine general medical examination at a health care facility 02/26/2011  . HYPERCHOLESTEROLEMIA, PURE 02/01/2007  . Essential hypertension 09/08/2006  . ALLERGIC RHINITIS 09/08/2006  . TOBACCO ABUSE, HX OF 09/08/2006   Past Medical History:  Diagnosis Date  . Allergic rhinitis   . Anxiety   . Breast cancer of lower-outer quadrant of right female breast ([place]) 06/19/2014  . Disorder of bone and cartilage, unspecified   . Family history of malignant neoplasm of breast   . Family history of other kidney diseases   . HLD (hyperlipidemia)   . HTN (hypertension)   . PONV (postoperative nausea and  vomiting)   . Tobacco abuse    Past Surgical History:  Procedure Laterality Date  . ABDOMINAL HYSTERECTOMY    . APPENDECTOMY    . BREAST LUMPECTOMY WITH NEEDLE LOCALIZATION AND AXILLARY SENTINEL LYMPH NODE BX Right 07/21/2014   Procedure: BREAST LUMPECTOMY WITH NEEDLE LOCALIZATION AND AXILLARY SENTINEL LYMPH NODE BIOPSY;  Surgeon: Rolm Bookbinder, MD;  Location: [facility];  Service: General;  Laterality: Right;  . COLONOSCOPY    . DILATION AND CURETTAGE OF UTERUS    . PORT-A-CATH REMOVAL N/A 03/12/2015   Procedure: REMOVAL PORT-A-CATH;  Surgeon: Rolm Bookbinder, MD;  Location: [facility];  Service: General;  Laterality: N/A;  . PORTACATH PLACEMENT Right 09/10/2014   Procedure: INSERTION PORT-A-CATH;  Surgeon: Rolm Bookbinder, MD;  Location: [facility];  Service: General;  Laterality: Right;  . TOTAL VAGINAL HYSTERECTOMY  1996   endometriosis   Social History   Tobacco Use  . Smoking status: Former Smoker    Packs/day: 1.00    Types: Cigarettes    Last attempt to quit: 06/25/2010    Years since quitting: 6.9  . Smokeless tobacco: Never Used  Substance Use Topics  . Alcohol use: Yes    Alcohol/week: 0.0 oz    Comment: occasional wine  . Drug use: No   Family History  Problem Relation Age of  Onset  . Hypertension Mother   . Kidney disease Father   . Breast cancer Sister 42  . Diabetes Paternal Aunt    Allergies  Allergen Reactions  . Dust Mite Extract   . Pollen Extract    Current Outpatient Medications on File Prior to Visit  Medication Sig Dispense Refill  . ALPRAZolam (XANAX) 0.5 MG tablet TAKE 1 TABLET EVERY DAY AS NEEDED 30 tablet 0  . atorvastatin (LIPITOR) 10 MG tablet Take 1 tablet (10 mg total) by mouth daily. 90 tablet 3  . b complex vitamins tablet Take 1 tablet by mouth daily.     . Calcium Carbonate-Vitamin D (CALCIUM-VITAMIN D) 500-200 MG-UNIT per tablet Take 3 tablets by mouth every morning.      . diphenhydrAMINE (BENADRYL)  25 MG tablet Take 25 mg by mouth daily as needed for allergies.     Marland Kitchen lisinopril-hydrochlorothiazide (PRINZIDE,ZESTORETIC) 20-25 MG tablet Take 1 tablet by mouth daily. 90 tablet 3  . metoprolol succinate (TOPROL-XL) 50 MG 24 hr tablet TAKE 1 TABLET EVERY DAY WITH OR IMMEDIATELY FOLLOWING A MEAL 90 tablet 3  . Multiple Vitamin (MULTIVITAMIN) tablet Take 1 tablet by mouth daily.      . tamoxifen (NOLVADEX) 20 MG tablet Take 1 tablet (20 mg total) by mouth daily. 90 tablet 2   No current facility-administered medications on file prior to visit.     Review of Systems  Constitutional: Negative for activity change, appetite change, fatigue, fever and unexpected weight change.  HENT: Negative for congestion, ear pain, rhinorrhea, sinus pressure and sore throat.   Eyes: Negative for pain, redness and visual disturbance.  Respiratory: Negative for cough, shortness of breath and wheezing.   Cardiovascular: Negative for chest pain and palpitations.  Gastrointestinal: Negative for abdominal pain, blood in stool, constipation and diarrhea.  Endocrine: Negative for polydipsia and polyuria.  Genitourinary: Negative for dysuria, frequency and urgency.  Musculoskeletal: Positive for back pain. Negative for arthralgias and myalgias.  Skin: Negative for pallor and rash.  Allergic/Immunologic: Negative for environmental allergies.  Neurological: Negative for dizziness, syncope, weakness, numbness and headaches.  Hematological: Negative for adenopathy. Does not bruise/bleed easily.  Psychiatric/Behavioral: Negative for decreased concentration and dysphoric mood. The patient is not nervous/anxious.        Objective:   Physical Exam  Constitutional: She appears well-developed and well-nourished. No distress.  overwt and well app  HENT:  Head: Normocephalic and atraumatic.  Eyes: Conjunctivae and EOM are normal. Pupils are equal, round, and reactive to light. No scleral icterus.  Neck: Normal range of  motion. Neck supple.  Cardiovascular: Normal rate and regular rhythm.  Pulmonary/Chest: Effort normal and breath sounds normal. She has no wheezes. She has no rales.  Abdominal: Soft. Bowel sounds are normal. She exhibits no distension. There is no tenderness.  Musculoskeletal: She exhibits tenderness.       Right shoulder: She exhibits tenderness and spasm. She exhibits normal range of motion, no bony tenderness, no swelling, no crepitus, no deformity, normal pulse and normal strength.       Lumbar back: She exhibits decreased range of motion, tenderness and spasm. She exhibits no bony tenderness and no edema.  Tender L lumbar musculature with palpable muscle tightness Nl rom with some pain on bilat lateral bend Nl gait  No neuro changes   Lymphadenopathy:    She has no cervical adenopathy.  Neurological: She is alert. She has normal strength and normal reflexes. She displays no atrophy. No cranial nerve deficit or  sensory deficit. She exhibits normal muscle tone. Coordination normal.  Negative SLR  Skin: Skin is warm and dry. No rash noted. No erythema. No pallor.  Psychiatric: She has a normal mood and affect.          Assessment & Plan:   Problem List Items Addressed This Visit      Other   Low back pain    In pt with hx of breast cancer   6 weeks starting with a strain-now mainly spasms after inactivity and soreness  Disc symptomatic care - see instructions on AVS  Muscle relaxer/ibuprofen prn  Rehab handout given xr of LS now- report to follow  Heat as well  PT eval and tx  Update if symptoms worsen       Relevant Medications   methocarbamol (ROBAXIN) 500 MG tablet   Other Relevant Orders   DG Lumbar Spine Complete   Ambulatory referral to Physical Therapy

## 2017-06-21 NOTE — Patient Instructions (Signed)
It sounds like you are having back spasms  Start stretching  Use heat 10 minutes at a time for spasm or soreness  Xray now   Try the methocarbamol if needed - watch out for sedation  It is a muscle relaxer  Ibuprofen is good also

## 2017-06-21 NOTE — Assessment & Plan Note (Addendum)
In pt with hx of breast cancer   6 weeks starting with a strain-now mainly spasms after inactivity and soreness  Disc symptomatic care - see instructions on AVS  Muscle relaxer/ibuprofen prn  Rehab handout given xr of LS now- report to follow  Heat as well  PT eval and tx  Update if symptoms worsen  Meds ordered this encounter  Medications  . methocarbamol (ROBAXIN) 500 MG tablet    Sig: Take 1 tablet (500 mg total) by mouth 3 (three) times daily as needed for muscle spasms.    Dispense:  30 tablet    Refill:  0

## 2017-06-22 ENCOUNTER — Encounter: Payer: Self-pay | Admitting: Family Medicine

## 2017-06-22 DIAGNOSIS — I700 Atherosclerosis of aorta: Secondary | ICD-10-CM | POA: Insufficient documentation

## 2017-06-22 NOTE — Assessment & Plan Note (Signed)
Doing well w/o clinical changes

## 2017-10-16 ENCOUNTER — Other Ambulatory Visit: Payer: Self-pay | Admitting: *Deleted

## 2017-10-16 MED ORDER — ALPRAZOLAM 0.5 MG PO TABS: ORAL_TABLET | ORAL | 0 refills | Status: DC

## 2017-10-16 NOTE — Telephone Encounter (Signed)
CPE scheduled on 05/21/18, last filled on 05/12/17 #30 tabs with 0 refills

## 2017-10-18 ENCOUNTER — Other Ambulatory Visit: Payer: Self-pay | Admitting: Oncology

## 2018-01-22 ENCOUNTER — Other Ambulatory Visit: Payer: Self-pay | Admitting: *Deleted

## 2018-01-22 MED ORDER — ALPRAZOLAM 0.5 MG PO TABS: ORAL_TABLET | ORAL | 0 refills | Status: DC

## 2018-01-22 NOTE — Telephone Encounter (Signed)
Name of Medication: Xanax Name of Pharmacy: [facility] or Written Date and Quantity: 10/16/17 #30 tabs with 0 refills  Last Office Visit and Type: Back Pain on 06/21/17 Next Office Visit and Type: AWV on 05/17/18 Last Controlled Substance Agreement Date: 04/28/15 Last UDS:04/28/15

## 2018-02-15 ENCOUNTER — Inpatient Hospital Stay: Payer: Medicare Other | Attending: Oncology

## 2018-02-15 DIAGNOSIS — C50511 Malignant neoplasm of lower-outer quadrant of right female breast: Secondary | ICD-10-CM | POA: Insufficient documentation

## 2018-02-15 DIAGNOSIS — Z170 Estrogen receptor positive status [ER+]: Secondary | ICD-10-CM | POA: Insufficient documentation

## 2018-02-15 DIAGNOSIS — Z79810 Long term (current) use of selective estrogen receptor modulators (SERMs): Secondary | ICD-10-CM | POA: Insufficient documentation

## 2018-02-15 DIAGNOSIS — M8580 Other specified disorders of bone density and structure, unspecified site: Secondary | ICD-10-CM | POA: Insufficient documentation

## 2018-02-15 LAB — COMPREHENSIVE METABOLIC PANEL WITH GFR
ALT: 31 U/L (ref 0–44)
AST: 32 U/L (ref 15–41)
Albumin: 3.9 g/dL (ref 3.5–5.0)
Alkaline Phosphatase: 51 U/L (ref 38–126)
Anion gap: 9 (ref 5–15)
BUN: 18 mg/dL (ref 8–23)
CO2: 28 mmol/L (ref 22–32)
Calcium: 10.3 mg/dL (ref 8.9–10.3)
Chloride: 106 mmol/L (ref 98–111)
Creatinine, Ser: 0.9 mg/dL (ref 0.44–1.00)
GFR calc Af Amer: >60 mL/min
GFR calc non Af Amer: >60 mL/min
Glucose, Bld: 85 mg/dL (ref 70–99)
Potassium: 3.9 mmol/L (ref 3.5–5.1)
Sodium: 143 mmol/L (ref 135–145)
Total Bilirubin: 0.4 mg/dL (ref 0.3–1.2)
Total Protein: 7 g/dL (ref 6.5–8.1)

## 2018-02-15 LAB — CBC WITH DIFFERENTIAL/PLATELET
Basophils Absolute: 0.1 10*3/uL (ref 0.0–0.1)
Basophils Relative: 1 %
Eosinophils Absolute: 0.2 10*3/uL (ref 0.0–0.5)
Eosinophils Relative: 3 %
HCT: 36.3 % (ref 34.8–46.6)
Hemoglobin: 12.5 g/dL (ref 11.6–15.9)
Lymphocytes Relative: 28 %
Lymphs Abs: 1.7 10*3/uL (ref 0.9–3.3)
MCH: 32.5 pg (ref 25.1–34.0)
MCHC: 34.4 g/dL (ref 31.5–36.0)
MCV: 94.3 fL (ref 79.5–101.0)
Monocytes Absolute: 0.7 10*3/uL (ref 0.1–0.9)
Monocytes Relative: 11 %
Neutro Abs: 3.5 10*3/uL (ref 1.5–6.5)
Neutrophils Relative %: 57 %
Platelets: 227 10*3/uL (ref 145–400)
RBC: 3.85 MIL/uL (ref 3.70–5.45)
RDW: 12.3 % (ref 11.2–14.5)
WBC: 6.1 10*3/uL (ref 3.9–10.3)

## 2018-02-21 NOTE — Progress Notes (Signed)
[place]  [phone] [phone]     ID: Jamie Dillon DOB: 10/23/1950  MR#: [id number]  GEZ#:[id number]  Patient Care Team: Abner Greenspan, MD as PCP - General Rolm Bookbinder, MD as Consulting Physician (General Surgery) Amerika Nourse, Virgie Dad, MD as Consulting Physician (Oncology) Eppie Gibson, MD as Attending Physician (Radiation Oncology) Rockwell Germany, RN as Registered Nurse Mauro Kaufmann, RN as Registered Nurse Holley Bouche, NP (Inactive) as Nurse Practitioner (Nurse Practitioner) Sylvan Cheese, NP as Nurse Practitioner (Nurse Practitioner) OTHER MD:  CHIEF COMPLAINT:  Estrogen receptor positive breast cancer  CURRENT TREATMENT: Tamoxifen   BREAST CANCER HISTORY: From the original intake note:  Jamie Dillon had routine screening mammography at [facility] 06/05/2014. The breast density was category B. There was a 6 mm irregular mass in the right breast. On 06/11/2014 the patient underwent right breast ultrasonography at [facility]. This confirmed a 7 mm all her than wide irregular mass in the right blast at the 8:00 position. 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  The patient's subsequent history is as detailed below  INTERVAL HISTORY: Jamie Dillon returns today for follow-up of her estrogen receptor positive breast cancer. She continues on tamoxifen, with good tolerance. She notes occasional hot flashes at night, however, declines having any hot flashes during the day. She denies vaginal wetness.   Since her last visit to the office, she underwent a bone density scan on 05/23/2017 showed at T-score of 0.5.   She had her mammogram at [facility] March 2019,  reportedly benign.  We are requesting a copy.   REVIEW OF SYSTEMS: Areona reports that for exercise, she walks 30 minutes daily and will be joining a gym once her husband recovers. 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 She denies unusual headaches, visual changes, nausea, vomiting, or dizziness. There has been no unusual cough, phlegm production, or pleurisy. This been no change in bowel or bladder habits. She denies unexplained fatigue or unexplained weight loss, bleeding, rash, or fever. A detailed review of systems was otherwise stable.     PAST MEDICAL HISTORY: Past Medical History:  Diagnosis Date  . Allergic rhinitis   . Anxiety   . Breast cancer of lower-outer quadrant of right female breast ([place]) 06/19/2014  . Disorder of bone and cartilage, unspecified   . Family history of malignant neoplasm of breast   . Family history of other kidney diseases   . HLD (hyperlipidemia)   . HTN (hypertension)   . PONV (postoperative nausea and vomiting)   . Tobacco abuse     PAST SURGICAL HISTORY: Past Surgical History:  Procedure Laterality Date  . ABDOMINAL HYSTERECTOMY    . APPENDECTOMY    . BREAST LUMPECTOMY WITH NEEDLE LOCALIZATION AND AXILLARY SENTINEL LYMPH NODE BX Right 07/21/2014   Procedure: BREAST LUMPECTOMY WITH NEEDLE LOCALIZATION AND AXILLARY SENTINEL LYMPH NODE BIOPSY;  Surgeon: Rolm Bookbinder, MD;  Location: [place];  Service: General;  Laterality: Right;  . COLONOSCOPY    . DILATION AND CURETTAGE OF UTERUS    . PORT-A-CATH REMOVAL N/A 03/12/2015   Procedure: REMOVAL PORT-A-CATH;  Surgeon: Rolm Bookbinder, MD;  Location: [place];  Service: General;  Laterality: N/A;  . PORTACATH PLACEMENT Right 09/10/2014   Procedure: INSERTION PORT-A-CATH;  Surgeon: Rolm Bookbinder, MD;  Location: [place];  Service: General;  Laterality: Right;  . TOTAL VAGINAL HYSTERECTOMY  1996    endometriosis    FAMILY HISTORY Family History  Problem Relation Age of Onset  . Hypertension Mother   . Kidney disease Father   . Breast cancer Sister 19  . Diabetes Paternal Aunt    the patient's father died from kidney failure at the age of 29. The patient's mother is still living at age 21. The patient had no brothers, one sister. That sister was diagnosed with breast cancer at age 92. The patient's father's mother was diagnosed with mouth cancer at the age of 92.  GYNECOLOGIC HISTORY:  No LMP recorded. Patient has had a hysterectomy. Menarche age 77, first live birth age 45. The patient is GX P2. She had a total abdominal hysterectomy with bilateral salpingo-oophorectomy in 1996. She took hormone replacement for approximately 10 years, until 2009. She also took oral contraceptives for approximately 7 years remotely, with no complications  SOCIAL HISTORY:  The patient is currently retired, though she still works part-time at the [employer] Her husband "Jamie Dillon" Jamie Dillon is retired from Press photographer. Son Jamie Dillon "Jamie Dillon" [place] teaches English at [employer] in [place]. Daughter Jamie Dillon lives in [place] . She works as a Marine scientist at the [employer]: In [place]: Social History   Tobacco Use  . Smoking status: Former Smoker    Packs/day: 1.00    Types: Cigarettes    Last attempt to quit: 06/25/2010    Years since quitting: 7.6  . Smokeless tobacco: Never Used  Substance Use Topics  . Alcohol use: Yes    Alcohol/week: 0.0 standard drinks    Comment: occasional wine  . Drug use: No     Colonoscopy: 2010  PAP:  Bone density: December 2015  Lipid panel:  Allergies  Allergen Reactions  . Dust Mite Extract   . Pollen Extract     Current Outpatient Medications  Medication Sig Dispense Refill  . ALPRAZolam (XANAX) 0.5 MG tablet TAKE 1 TABLET BY MOUTH EVERY DAY AS NEEDED 30 tablet 0  .  atorvastatin (LIPITOR) 10 MG tablet Take 1 tablet (10 mg total) by mouth daily. 90 tablet 3  . b complex vitamins tablet Take 1 tablet by mouth daily.     . Calcium Carbonate-Vitamin D (CALCIUM-VITAMIN D) 500-200 MG-UNIT per tablet Take 3 tablets by mouth every morning.      . diphenhydrAMINE (BENADRYL) 25 MG tablet Take 25 mg by mouth daily as needed for allergies.     Marland Kitchen lisinopril-hydrochlorothiazide (PRINZIDE,ZESTORETIC) 20-25 MG tablet Take 1 tablet by mouth daily. 90 tablet 3  . methocarbamol (ROBAXIN) 500 MG tablet Take 1 tablet (500 mg total) by mouth 3 (three) times daily as needed for muscle spasms. 30 tablet 0  . metoprolol succinate (TOPROL-XL) 50 MG 24 hr tablet TAKE 1 TABLET EVERY DAY WITH OR IMMEDIATELY FOLLOWING A MEAL 90 tablet 3  . Multiple Vitamin (MULTIVITAMIN) tablet Take 1 tablet by mouth daily.      . tamoxifen (NOLVADEX) 20 MG tablet TAKE 1 TABLET BY MOUTH EVERY DAY 90 tablet 2   No current facility-administered medications for this visit.     OBJECTIVE: Middle-aged white woman who appears well  Vitals:   02/22/18 1314  BP: 107/74  Pulse: 91  Resp: 19  Temp: 98.3 F (36.8 C)  SpO2: 98%     Body mass index is 26.24 kg/m.    ECOG FS:0 - Asymptomatic  Sclerae unicteric, pupils round and equal No cervical or supraclavicular adenopathy Lungs no rales or rhonchi Heart regular rate and rhythm Abd soft, nontender, positive bowel sounds MSK no focal spinal tenderness, no upper extremity lymphedema Neuro: nonfocal, well oriented, appropriate affect Breasts: The right breast has undergone lumpectomy and radiation.  The cosmetic result is good.  There is no evidence of local recurrence.  The left breast is benign.  Both axillae are benign.  LAB RESULTS:  CMP     Component Value Date/Time   NA 143 02/15/2018 1135   NA 141 02/15/2017 1149   K 3.9 02/15/2018 1135   K 3.6 02/15/2017 1149   CL 106 02/15/2018 1135   CO2 28 02/15/2018 1135   CO2 25 02/15/2017 1149    GLUCOSE 85 02/15/2018 1135   GLUCOSE 82 02/15/2017 1149   BUN 18 02/15/2018 1135   BUN 16.2 02/15/2017 1149   CREATININE 0.90 02/15/2018 1135   CREATININE 0.9 02/15/2017 1149   CALCIUM 10.3 02/15/2018 1135   CALCIUM 10.1 02/15/2017 1149   PROT 7.0 02/15/2018 1135   PROT 7.2 02/15/2017 1149   ALBUMIN 3.9 02/15/2018 1135   ALBUMIN 4.0 02/15/2017 1149   AST 32 02/15/2018 1135   AST 26 02/15/2017 1149   ALT 31 02/15/2018 1135   ALT 27 02/15/2017 1149   ALKPHOS 51 02/15/2018 1135   ALKPHOS 55 02/15/2017 1149   BILITOT 0.4 02/15/2018 1135   BILITOT 0.50 02/15/2017 1149   GFRNONAA >60 02/15/2018 1135   GFRAA >60 02/15/2018 1135    INo results found for: SPEP, UPEP  Lab Results  Component Value Date   WBC 6.1 02/15/2018   NEUTROABS 3.5 02/15/2018   HGB 12.5 02/15/2018   HCT 36.3 02/15/2018   MCV 94.3 02/15/2018   PLT 227 02/15/2018      Chemistry      Component Value Date/Time   NA 143 02/15/2018 1135   NA 141 02/15/2017 1149   K 3.9 02/15/2018 1135   K 3.6 02/15/2017 1149   CL 106 02/15/2018 1135   CO2 28 02/15/2018 1135   CO2 25 02/15/2017 1149   BUN 18 02/15/2018 1135   BUN 16.2 02/15/2017 1149   CREATININE 0.90 02/15/2018 1135   CREATININE 0.9 02/15/2017 1149      Component Value Date/Time   CALCIUM 10.3 02/15/2018 1135   CALCIUM 10.1 02/15/2017 1149   ALKPHOS 51 02/15/2018 1135   ALKPHOS 55 02/15/2017 1149   AST 32 02/15/2018 1135   AST 26 02/15/2017 1149   ALT 31 02/15/2018 1135   ALT 27 02/15/2017 1149   BILITOT 0.4 02/15/2018 1135   BILITOT 0.50 02/15/2017 1149       No results found for: LABCA2  No components found for: LABCA125  No results for input(s): INR in the last 168 hours.  Urinalysis    Component Value Date/Time   COLORURINE YELLOW 09/21/2014 1809   APPEARANCEUR CLOUDY (A) 09/21/2014 1809   LABSPEC 1.009 09/21/2014 1809   PHURINE 7.0 09/21/2014 1809   GLUCOSEU NEGATIVE 09/21/2014 1809   HGBUR MODERATE (A) 09/21/2014 1809    HGBUR negative 02/13/2008 0911   BILIRUBINUR Neg. 10/15/2014 [place] 09/21/2014 1809   PROTEINUR Neg. 10/15/2014 1248   PROTEINUR 30 (A) 09/21/2014 1809   UROBILINOGEN 0.2 10/15/2014 1248  UROBILINOGEN 0.2 09/21/2014 1809   NITRITE Neg. 10/15/2014 1248   NITRITE POSITIVE (A) 09/21/2014 1809   LEUKOCYTESUR Negative 10/15/2014 1248    STUDIES: Bone density scan on 05/23/2017 showed at T-score of -0.7. --Normal  ASSESSMENT: 67 y.o. [place] woman status post right breastLower outer quadrant biopsy 06/17/2014 for a clinical T1b N0, stage I invasive ductal carcinoma, grade 2, estrogen receptor strongly positive, progesterone receptor moderately positive, with no HER-2 amplification and an MIB-1 of 22%.  (1) status post right lumpectomy and right axillary sentinel lymph node sampling 07/21/2014 for a pT1b pN1a, stage IIA invasive ductal carcinoma, grade 1, with negative margins, repeat HER-2/neu again negative.  (2) adjuvant chemotherapy consisting of cyclophosphamide and docetaxel started 09/15/2014, given every 3 weeks with Neulasta support, completed 11/18/2014  (a) Neutropenic fever and hospitalization 5 days after the first cycle: subsequent cycles 10% dose reduced  (3) adjuvant radiation 12/10/2014-01/21/2015: Right breast/ axilla // 50 Gy in 25 fractions Right breast boost / 10 Gy in 5 fractions  (4) started tamoxifen 02/14/2015  (5) osteopenia: bone density January 2016 shows a T score of -1.1 at the femoral neck  PLAN: Jaquanna is now 3 and half years out from definitive surgery for breast cancer with no evidence of disease recurrence.  This is very febrile.  She is tolerating tamoxifen well and the plan will be to continue that a total of 5 years.  We reviewed her bone density results today, which are quite favorable.  She will see me again in a year.  She knows to call for any problems that may develop before the next visit.  Brendin Situ, Virgie Dad, MD  02/22/18  1:21 PM Medical Oncology and Hematology [facility] [address] [place], [place] [address] Tel. [phone]    Fax. [phone]    I, Soijett Blue am acting as scribe for Dr. Sarajane Jews C. Ikhlas Albo.  I, Lurline Del MD, have reviewed the above documentation for accuracy and completeness, and I agree with the above.

## 2018-02-22 ENCOUNTER — Inpatient Hospital Stay (HOSPITAL_BASED_OUTPATIENT_CLINIC_OR_DEPARTMENT_OTHER): Payer: Medicare Other | Admitting: Oncology

## 2018-02-22 ENCOUNTER — Telehealth: Payer: Self-pay | Admitting: Oncology

## 2018-02-22 VITALS — BP 107/74 | HR 91 | Temp 98.3°F | Resp 19 | Ht 60.5 in | Wt 136.6 lb

## 2018-02-22 DIAGNOSIS — M8580 Other specified disorders of bone density and structure, unspecified site: Secondary | ICD-10-CM

## 2018-02-22 DIAGNOSIS — Z79810 Long term (current) use of selective estrogen receptor modulators (SERMs): Secondary | ICD-10-CM

## 2018-02-22 DIAGNOSIS — Z170 Estrogen receptor positive status [ER+]: Secondary | ICD-10-CM | POA: Diagnosis not present

## 2018-02-22 DIAGNOSIS — C50511 Malignant neoplasm of lower-outer quadrant of right female breast: Secondary | ICD-10-CM

## 2018-02-22 NOTE — Telephone Encounter (Signed)
Gave pt avs and calendar  °

## 2018-03-16 DIAGNOSIS — H269 Unspecified cataract: Secondary | ICD-10-CM

## 2018-03-16 HISTORY — DX: Unspecified cataract: H26.9

## 2018-05-17 ENCOUNTER — Ambulatory Visit (INDEPENDENT_AMBULATORY_CARE_PROVIDER_SITE_OTHER): Payer: Medicare Other

## 2018-05-17 ENCOUNTER — Telehealth: Payer: Self-pay | Admitting: Family Medicine

## 2018-05-17 ENCOUNTER — Ambulatory Visit: Payer: Self-pay

## 2018-05-17 VITALS — BP 118/72 | HR 77 | Temp 98.4°F | Ht 60.5 in | Wt 134.5 lb

## 2018-05-17 DIAGNOSIS — I10 Essential (primary) hypertension: Secondary | ICD-10-CM

## 2018-05-17 DIAGNOSIS — E7800 Pure hypercholesterolemia, unspecified: Secondary | ICD-10-CM | POA: Diagnosis not present

## 2018-05-17 DIAGNOSIS — Z0000 Encounter for general adult medical examination without abnormal findings: Secondary | ICD-10-CM | POA: Diagnosis not present

## 2018-05-17 LAB — LIPID PANEL
CHOL/HDL RATIO: 3
Cholesterol: 144 mg/dL (ref 0–200)
HDL: 43.9 mg/dL (ref >39.00)
LDL Cholesterol: 73 mg/dL (ref 0–99)
NonHDL: 99.94
TRIGLYCERIDES: 135 mg/dL (ref 0.0–149.0)
VLDL: 27 mg/dL (ref 0.0–40.0)

## 2018-05-17 LAB — CBC WITH DIFFERENTIAL/PLATELET
Basophils Absolute: 0.1 10*3/uL (ref 0.0–0.1)
Basophils Relative: 1 % (ref 0.0–3.0)
Eosinophils Absolute: 0.2 10*3/uL (ref 0.0–0.7)
Eosinophils Relative: 2.6 % (ref 0.0–5.0)
HCT: 39.3 % (ref 36.0–46.0)
Hemoglobin: 13.4 g/dL (ref 12.0–15.0)
Lymphocytes Relative: 26.5 % (ref 12.0–46.0)
Lymphs Abs: 2.2 10*3/uL (ref 0.7–4.0)
MCHC: 34.2 g/dL (ref 30.0–36.0)
MCV: 95.7 fl (ref 78.0–100.0)
Monocytes Absolute: 0.6 10*3/uL (ref 0.1–1.0)
Monocytes Relative: 7.5 % (ref 3.0–12.0)
NEUTROS ABS: 5.1 10*3/uL (ref 1.4–7.7)
Neutrophils Relative %: 62.4 % (ref 43.0–77.0)
Platelets: 267 10*3/uL (ref 150.0–400.0)
RBC: 4.11 Mil/uL (ref 3.87–5.11)
RDW: 13.2 % (ref 11.5–15.5)
WBC: 8.2 10*3/uL (ref 4.0–10.5)

## 2018-05-17 LAB — COMPREHENSIVE METABOLIC PANEL
ALK PHOS: 47 U/L (ref 39–117)
ALT: 26 U/L (ref 0–35)
AST: 31 U/L (ref 0–37)
Albumin: 4.2 g/dL (ref 3.5–5.2)
BILIRUBIN TOTAL: 0.5 mg/dL (ref 0.2–1.2)
BUN: 26 mg/dL — ABNORMAL HIGH (ref 6–23)
CALCIUM: 10.3 mg/dL (ref 8.4–10.5)
CO2: 27 meq/L (ref 19–32)
Chloride: 104 mEq/L (ref 96–112)
Creatinine, Ser: 1.02 mg/dL (ref 0.40–1.20)
GFR: 57.08 mL/min — AB (ref >60.00)
Glucose, Bld: 97 mg/dL (ref 70–99)
POTASSIUM: 4.1 meq/L (ref 3.5–5.1)
Sodium: 140 mEq/L (ref 135–145)
TOTAL PROTEIN: 7.2 g/dL (ref 6.0–8.3)

## 2018-05-17 LAB — TSH: TSH: 2.11 u[IU]/mL (ref 0.35–4.50)

## 2018-05-17 NOTE — Progress Notes (Signed)
Subjective:   Jamie Dillon is a 68 y.o. female who presents for Medicare Annual (Subsequent) preventive examination.  Review of Systems:  N/A Cardiac Risk Factors include: advanced age (>79men, >41 women);dyslipidemia;hypertension     Objective:     Vitals: BP 118/72 (BP Location: Right Arm, Patient Position: Sitting, Cuff Size: Normal)   Pulse 77   Temp 98.4 F (36.9 C) (Oral)   Ht 5' 0.5" (1.537 m) Comment: no shoes  Wt 134 lb 8 oz (61 kg)   SpO2 97%   BMI 25.84 kg/m   Body mass index is 25.84 kg/m.  Advanced Directives 05/17/2018 06/15/2016 02/23/2016 03/12/2015 03/05/2015 02/27/2015 02/10/2015  Does Patient Have a Medical Advance Directive? Yes Yes Yes Yes Yes Yes Yes  Type of Paramedic of [place];Living will - Living will Living will [place];Living will [place];Living will  Does patient want to make changes to medical advance directive? - - - No - Patient declined - - No - Patient declined  Copy of [place] in Chart? No - copy requested No - copy requested - No - copy requested - No - copy requested No - copy requested  Would patient like information on creating a medical advance directive? - - - - - - -    Tobacco Social History   Tobacco Use  Smoking Status Former Smoker  . Packs/day: 1.00  . Types: Cigarettes  . Last attempt to quit: 06/25/2010  . Years since quitting: 7.8  Smokeless Tobacco Never Used     Counseling given: No   Clinical Intake:  Pre-visit preparation completed: Yes  Pain : No/denies pain Pain Score: 0-No pain     Nutritional Status: BMI 25 -29 Overweight Nutritional Risks: None Diabetes: No  How often do you need to have someone help you when you read instructions, pamphlets, or other written materials from your doctor or pharmacy?: 1 - Never What is the last grade level you completed in school?: 12th  grade  Interpreter Needed?: No  Comments: pt lives with spouse Information entered by :: LPinson, LPN  Past Medical History:  Diagnosis Date  . Allergic rhinitis   . Anxiety   . Breast cancer of lower-outer quadrant of right female breast ([place]) 06/19/2014  . Cataract 03/2018   bilateral eyes  . Disorder of bone and cartilage, unspecified   . Family history of malignant neoplasm of breast   . Family history of other kidney diseases   . HLD (hyperlipidemia)   . HTN (hypertension)   . PONV (postoperative nausea and vomiting)   . Tobacco abuse    Past Surgical History:  Procedure Laterality Date  . ABDOMINAL HYSTERECTOMY    . APPENDECTOMY    . BREAST LUMPECTOMY WITH NEEDLE LOCALIZATION AND AXILLARY SENTINEL LYMPH NODE BX Right 07/21/2014   Procedure: BREAST LUMPECTOMY WITH NEEDLE LOCALIZATION AND AXILLARY SENTINEL LYMPH NODE BIOPSY;  Surgeon: Rolm Bookbinder, MD;  Location: [place];  Service: General;  Laterality: Right;  . COLONOSCOPY    . DILATION AND CURETTAGE OF UTERUS    . PORT-A-CATH REMOVAL N/A 03/12/2015   Procedure: REMOVAL PORT-A-CATH;  Surgeon: Rolm Bookbinder, MD;  Location: [place];  Service: General;  Laterality: N/A;  . PORTACATH PLACEMENT Right 09/10/2014   Procedure: INSERTION PORT-A-CATH;  Surgeon: Rolm Bookbinder, MD;  Location: [place];  Service: General;  Laterality: Right;  . TOTAL VAGINAL HYSTERECTOMY  1996   endometriosis  Family History  Problem Relation Age of Onset  . Hypertension Mother   . Kidney disease Father   . Breast cancer Sister 25  . Diabetes Paternal Aunt    Social History   Socioeconomic History  . Marital status: Married    Spouse name: Not on file  . Number of children: 2  . Years of education: Not on file  . Highest education level: Not on file  Occupational History  . Not on file  Social Needs  . Financial resource strain: Not on file  . Food insecurity:    Worry: Not on file     Inability: Not on file  . Transportation needs:    Medical: Not on file    Non-medical: Not on file  Tobacco Use  . Smoking status: Former Smoker    Packs/day: 1.00    Types: Cigarettes    Last attempt to quit: 06/25/2010    Years since quitting: 7.8  . Smokeless tobacco: Never Used  Substance and Sexual Activity  . Alcohol use: Yes    Alcohol/week: 0.0 standard drinks    Comment: rarely wine/ 2x per year  . Drug use: No  . Sexual activity: Not on file  Lifestyle  . Physical activity:    Days per week: Not on file    Minutes per session: Not on file  . Stress: Not on file  Relationships  . Social connections:    Talks on phone: Not on file    Gets together: Not on file    Attends religious service: Not on file    Active member of club or organization: Not on file    Attends meetings of clubs or organizations: Not on file    Relationship status: Not on file  Other Topics Concern  . Not on file  Social History Narrative   Gym and walking for exercise      Cares for elderly mother and sister      married    Outpatient Encounter Medications as of 05/17/2018  Medication Sig  . ALPRAZolam (XANAX) 0.5 MG tablet TAKE 1 TABLET BY MOUTH EVERY DAY AS NEEDED  . atorvastatin (LIPITOR) 10 MG tablet Take 1 tablet (10 mg total) by mouth daily.  Marland Kitchen b complex vitamins tablet Take 1 tablet by mouth daily.   . Calcium Carbonate-Vitamin D (CALCIUM-VITAMIN D) 500-200 MG-UNIT per tablet Take 3 tablets by mouth every morning.    . diphenhydrAMINE (BENADRYL) 25 MG tablet Take 25 mg by mouth daily as needed for allergies.   Marland Kitchen lisinopril-hydrochlorothiazide (PRINZIDE,ZESTORETIC) 20-25 MG tablet Take 1 tablet by mouth daily.  . metoprolol succinate (TOPROL-XL) 50 MG 24 hr tablet TAKE 1 TABLET EVERY DAY WITH OR IMMEDIATELY FOLLOWING A MEAL  . Multiple Vitamin (MULTIVITAMIN) tablet Take 1 tablet by mouth daily.    . tamoxifen (NOLVADEX) 20 MG tablet TAKE 1 TABLET BY MOUTH EVERY DAY   No  facility-administered encounter medications on file as of 05/17/2018.     Activities of Daily Living In your present state of health, do you have any difficulty performing the following activities: 05/17/2018  Hearing? N  Vision? N  Difficulty concentrating or making decisions? N  Walking or climbing stairs? N  Dressing or bathing? N  Doing errands, shopping? N  Preparing Food and eating ? N  Using the Toilet? N  In the past six months, have you accidently leaked urine? N  Do you have problems with loss of bowel control? N  Managing your Medications?  N  Managing your Finances? N  Housekeeping or managing your Housekeeping? N  Some recent data might be hidden    Patient Care Team: Tower, Wynelle Fanny, MD as PCP - General Rolm Bookbinder, MD as Consulting Physician (General Surgery) Magrinat, Virgie Dad, MD as Consulting Physician (Oncology) Eppie Gibson, MD as Attending Physician (Radiation Oncology) Rockwell Germany, RN as Registered Nurse Mauro Kaufmann, RN as Registered Nurse Holley Bouche, NP (Inactive) as Nurse Practitioner (Nurse Practitioner) Sylvan Cheese, NP as Nurse Practitioner (Nurse Practitioner)    Assessment:   This is a routine wellness examination for Jamie Dillon.   Hearing Screening   125Hz  250Hz  500Hz  1000Hz  2000Hz  3000Hz  4000Hz  6000Hz  8000Hz   Right ear:   40 40 40  40    Left ear:   40 40 40  40    Vision Screening Comments: Vision exam in Nov 2019 @ [facility]/Dr. Valetta Close   Exercise Activities and Dietary recommendations Current Exercise Habits: The patient does not participate in regular exercise at present(temporary due to cold), Exercise limited by: None identified  Goals    . Increase physical activity     When tolerated and weather permitting, I will resume walking 30 min 5-7 days per week.       Fall Risk Fall Risk  05/17/2018 06/15/2016 05/03/2016 04/28/2015 02/27/2015  Falls in the past year? 0 No No No No   Depression  Screen PHQ 2/9 Scores 05/17/2018 06/15/2016 05/03/2016 04/28/2015  PHQ - 2 Score 0 0 0 0  PHQ- 9 Score 0 - - -     Cognitive Function MMSE - Mini Mental State Exam 05/17/2018 06/15/2016  Orientation to time 5 5  Orientation to Place 5 5  Registration 3 3  Attention/ Calculation 0 0  Recall 3 3  Language- name 2 objects 0 0  Language- repeat 1 1  Language- follow 3 step command 3 3  Language- read & follow direction 0 0  Write a sentence 0 0  Copy design 0 0  Total score 20 20     PLEASE NOTE: A Mini-Cog screen was completed. Maximum score is 20. A value of 0 denotes this part of [place] MMSE was not completed or the patient failed this part of the Mini-Cog screening.   Mini-Cog Screening Orientation to Time - Max 5 pts Orientation to Place - Max 5 pts Registration - Max 3 pts Recall - Max 3 pts Language Repeat - Max 1 pts Language Follow 3 Step Command - Max 3 pts     Immunization History  Administered Date(s) Administered  . Influenza Split 03/04/2011  . Influenza Whole 02/13/2008, 02/16/2009, 03/01/2010  . Influenza, High Dose Seasonal PF 03/12/2015, 01/26/2016, 02/10/2017, 01/26/2018  . Influenza-Unspecified 02/14/2014  . Pneumococcal Conjugate-13 04/28/2015  . Pneumococcal Polysaccharide-23 02/13/2008, 04/25/2014  . Td 10/04/2001  . Tdap 03/04/2011    Screening Tests Health Maintenance  Topic Date Due  . MAMMOGRAM  07/28/2018  . COLONOSCOPY  03/31/2019  . TETANUS/TDAP  03/03/2021  . INFLUENZA VACCINE  Completed  . DEXA SCAN  Completed  . Hepatitis C Screening  Completed  . PNA vac Low Risk Adult  Completed      Plan:     I have personally reviewed, addressed, and noted the following in the patient's chart:  A. Medical and social history B. Use of alcohol, tobacco or illicit drugs  C. Current medications and supplements D. Functional ability and status E.  Nutritional status F.  Physical activity G.  Advance directives H. List of other physicians I.   Hospitalizations, surgeries, and ER visits in previous 12 months J.  [place] to include hearing, vision, cognitive, depression L. Referrals and appointments - none  In addition, I have reviewed and discussed with patient certain preventive protocols, quality metrics, and best practice recommendations. A written personalized care plan for preventive services as well as general preventive health recommendations were provided to patient.  See attached scanned questionnaire for additional information.   Signed,   Lindell Noe, MHA, BS, LPN Health Coach

## 2018-05-17 NOTE — Progress Notes (Signed)
PCP notes:   Health maintenance:  No gaps identified.  Abnormal screenings:   None  Patient concerns:   None  Nurse concerns:  None  Next PCP appt:   05/21/18 @ 0930

## 2018-05-17 NOTE — Progress Notes (Signed)
I reviewed health advisor's note, was available for consultation, and agree with documentation and plan.  

## 2018-05-17 NOTE — Telephone Encounter (Signed)
-----   Message from Eustace Pen, LPN sent at [phone]  1:25 PM EST ----- Regarding: Labs 1/2 Lab orders needed. Thank you.  Insurance:  [facility] Medicare

## 2018-05-17 NOTE — Patient Instructions (Signed)
Jamie Dillon , Thank you for taking time to come for your Medicare Wellness Visit. I appreciate your ongoing commitment to your health goals. Please review the following plan we discussed and let me know if I can assist you in the future.   These are the goals we discussed: Goals    . Increase physical activity     When tolerated and weather permitting, I will resume walking 30 min 5-7 days per week.       This is a list of the screening recommended for you and due dates:  Health Maintenance  Topic Date Due  . Mammogram  07/28/2018  . Colon Cancer Screening  03/31/2019  . Tetanus Vaccine  03/03/2021  . Flu Shot  Completed  . DEXA scan (bone density measurement)  Completed  .  Hepatitis C: One time screening is recommended by Center for Disease Control  (CDC) for  adults born from [birth year] through 1967.   Completed  . Pneumonia vaccines  Completed   Preventive Care for Adults  A healthy lifestyle and preventive care can promote health and wellness. Preventive health guidelines for adults include the following key practices.  . A routine yearly physical is a good way to check with your health care provider about your health and preventive screening. It is a chance to share any concerns and updates on your health and to receive a thorough exam.  . Visit your dentist for a routine exam and preventive care every 6 months. Brush your teeth twice a day and floss once a day. Good oral hygiene prevents tooth decay and gum disease.  . The frequency of eye exams is based on your age, health, family medical history, use  of contact lenses, and other factors. Follow your health care provider's recommendations for frequency of eye exams.  . Eat a healthy diet. Foods like vegetables, fruits, whole grains, low-fat dairy products, and lean protein foods contain the nutrients you need without too many calories. Decrease your intake of foods high in solid fats, added sugars, and salt. Eat the right amount  of calories for you. Get information about a proper diet from your health care provider, if necessary.  . Regular physical exercise is one of the most important things you can do for your health. Most adults should get at least 150 minutes of moderate-intensity exercise (any activity that increases your heart rate and causes you to sweat) each week. In addition, most adults need muscle-strengthening exercises on 2 or more days a week.  Silver Sneakers may be a benefit available to you. To determine eligibility, you may visit the website: www.silversneakers.com or contact program at [phone]) [phone] Mon-Fri between 8AM-8PM.   . Maintain a healthy weight. The body mass index (BMI) is a screening tool to identify possible weight problems. It provides an estimate of body fat based on height and weight. Your health care provider can find your BMI and can help you achieve or maintain a healthy weight.   For adults 20 years and older: ? A BMI below 18.5 is considered underweight. ? A BMI of 18.5 to 24.9 is normal. ? A BMI of 25 to 29.9 is considered overweight. ? A BMI of 30 and above is considered obese.   . Maintain normal blood lipids and cholesterol levels by exercising and minimizing your intake of saturated fat. Eat a balanced diet with plenty of fruit and vegetables. Blood tests for lipids and cholesterol should begin at age 108 and be repeated every 5 years.  If your lipid or cholesterol levels are high, you are over 50, or you are at high risk for heart disease, you may need your cholesterol levels checked more frequently. Ongoing high lipid and cholesterol levels should be treated with medicines if diet and exercise are not working.  . If you smoke, find out from your health care provider how to quit. If you do not use tobacco, please do not start.  . If you choose to drink alcohol, please do not consume more than 2 drinks per day. One drink is considered to be 12 ounces (355 mL) of beer, 5 ounces  (148 mL) of wine, or 1.5 ounces (44 mL) of liquor.  . If you are 35-2 years old, ask your health care provider if you should take aspirin to prevent strokes.  . Use sunscreen. Apply sunscreen liberally and repeatedly throughout the day. You should seek shade when your shadow is shorter than you. Protect yourself by wearing long sleeves, pants, a wide-brimmed hat, and sunglasses year round, whenever you are outdoors.  . Once a month, do a whole body skin exam, using a mirror to look at the skin on your back. Tell your health care provider of new moles, moles that have irregular borders, moles that are larger than a pencil eraser, or moles that have changed in shape or color.

## 2018-05-21 ENCOUNTER — Encounter: Payer: Self-pay | Admitting: Family Medicine

## 2018-05-21 ENCOUNTER — Ambulatory Visit (INDEPENDENT_AMBULATORY_CARE_PROVIDER_SITE_OTHER): Payer: Medicare Other | Admitting: Family Medicine

## 2018-05-21 VITALS — BP 116/72 | HR 78 | Temp 98.1°F | Ht 60.5 in | Wt 136.0 lb

## 2018-05-21 DIAGNOSIS — I700 Atherosclerosis of aorta: Secondary | ICD-10-CM | POA: Diagnosis not present

## 2018-05-21 DIAGNOSIS — Z0000 Encounter for general adult medical examination without abnormal findings: Secondary | ICD-10-CM

## 2018-05-21 DIAGNOSIS — I10 Essential (primary) hypertension: Secondary | ICD-10-CM | POA: Diagnosis not present

## 2018-05-21 DIAGNOSIS — E7800 Pure hypercholesterolemia, unspecified: Secondary | ICD-10-CM

## 2018-05-21 DIAGNOSIS — Z170 Estrogen receptor positive status [ER+]: Secondary | ICD-10-CM

## 2018-05-21 DIAGNOSIS — C50511 Malignant neoplasm of lower-outer quadrant of right female breast: Secondary | ICD-10-CM

## 2018-05-21 DIAGNOSIS — F419 Anxiety disorder, unspecified: Secondary | ICD-10-CM

## 2018-05-21 MED ORDER — LISINOPRIL-HYDROCHLOROTHIAZIDE 20-25 MG PO TABS: 1.0000 | ORAL_TABLET | Freq: Every day | ORAL | 3 refills | Status: DC

## 2018-05-21 MED ORDER — ALPRAZOLAM 0.5 MG PO TABS: ORAL_TABLET | ORAL | 0 refills | Status: DC

## 2018-05-21 MED ORDER — METOPROLOL SUCCINATE ER 50 MG PO TB24: ORAL_TABLET | ORAL | 3 refills | Status: DC

## 2018-05-21 MED ORDER — ATORVASTATIN CALCIUM 10 MG PO TABS: 10.0000 mg | ORAL_TABLET | Freq: Every day | ORAL | 3 refills | Status: DC

## 2018-05-21 NOTE — Assessment & Plan Note (Signed)
Doing well with tamoxifen and regular oncol f/u and mammograms

## 2018-05-21 NOTE — Assessment & Plan Note (Signed)
Refilled xanax for use with caution - dentist/ travel

## 2018-05-21 NOTE — Assessment & Plan Note (Signed)
bp in fair control at this time  BP Readings from Last 1 Encounters:  05/21/18 116/72   No changes needed Most recent labs reviewed  Disc lifstyle change with low sodium diet and exercise

## 2018-05-21 NOTE — Patient Instructions (Addendum)
Enjoy exercise- try new things  Also eat healthy   Labs are stable   I refilled xanax   No new medicines

## 2018-05-21 NOTE — Progress Notes (Signed)
Subjective:    Patient ID: Jamie Dillon, female    DOB: 02/23/1951, 68 y.o.   MRN: [id number]  HPI Here for health maintenance exam and to review chronic medical problems    Doing well  Taking care of herself   Wt Readings from Last 3 Encounters:  05/21/18 136 lb (61.7 kg)  05/17/18 134 lb 8 oz (61 kg)  02/22/18 136 lb 9.6 oz (62 kg)  stable  Exercise- started gym a week ago /enjoys that  26.12 kg/m   amw was 1/2 No concerns or gaps   mammogram 3/19 Self breast exam -no lumps or changes  Personal hx of R breast cancer (sees on oncol every oct)  Tamoxifen tx (2 more years for now)  Sister-breast cancer at 28  bp is stable today  No cp or palpitations or headaches or edema  No side effects to medicines  BP Readings from Last 3 Encounters:  05/21/18 116/72  05/17/18 118/72  02/22/18 107/74      Colonoscopy 11/10 -10 y recall  Will be due in Nov   dexa 1/19 with BMD in normal range No falls or fx Taking vit D with ca  Ca 1600 mg  Exercising   Cholesterol Lab Results  Component Value Date   CHOL 144 05/17/2018   CHOL 128 05/05/2017   CHOL 143 04/22/2016   Lab Results  Component Value Date   HDL 43.90 05/17/2018   HDL 39.70 05/05/2017   HDL 40.20 04/22/2016   Lab Results  Component Value Date   LDLCALC 73 05/17/2018   LDLCALC 60 05/05/2017   LDLCALC 67 04/22/2016   Lab Results  Component Value Date   TRIG 135.0 05/17/2018   TRIG 140.0 05/05/2017   TRIG 176.0 (H) 04/22/2016   Lab Results  Component Value Date   CHOLHDL 3 05/17/2018   CHOLHDL 3 05/05/2017   CHOLHDL 4 04/22/2016   Lab Results  Component Value Date   LDLDIRECT 127.3 03/01/2011   atorvastatin and diet  husb had CAD/bypass- they are both eating better   Other labs Results for orders placed or performed in visit on 05/17/18  TSH  Result Value Ref Range   TSH 2.11 0.35 - 4.50 uIU/mL  Lipid panel  Result Value Ref Range   Cholesterol 144 0 - 200 mg/dL   Triglycerides 135.0  0.0 - 149.0 mg/dL   HDL 43.90 >39.00 mg/dL   VLDL 27.0 0.0 - 40.0 mg/dL   LDL Cholesterol 73 0 - 99 mg/dL   Total CHOL/HDL Ratio 3    NonHDL 99.94   Comprehensive metabolic panel  Result Value Ref Range   Sodium 140 135 - 145 mEq/L   Potassium 4.1 3.5 - 5.1 mEq/L   Chloride 104 96 - 112 mEq/L   CO2 27 19 - 32 mEq/L   Glucose, Bld 97 70 - 99 mg/dL   BUN 26 (H) 6 - 23 mg/dL   Creatinine, Ser 1.02 0.40 - 1.20 mg/dL   Total Bilirubin 0.5 0.2 - 1.2 mg/dL   Alkaline Phosphatase 47 39 - 117 U/L   AST 31 0 - 37 U/L   ALT 26 0 - 35 U/L   Total Protein 7.2 6.0 - 8.3 g/dL   Albumin 4.2 3.5 - 5.2 g/dL   Calcium 10.3 8.4 - 10.5 mg/dL   GFR 57.08 (L) >60.00 mL/min  CBC with Differential/Platelet  Result Value Ref Range   WBC 8.2 4.0 - 10.5 K/uL   RBC 4.11 3.87 - 5.11  Mil/uL   Hemoglobin 13.4 12.0 - 15.0 g/dL   HCT 39.3 36.0 - 46.0 %   MCV 95.7 78.0 - 100.0 fl   MCHC 34.2 30.0 - 36.0 g/dL   RDW 13.2 11.5 - 15.5 %   Platelets 267.0 150.0 - 400.0 K/uL   Neutrophils Relative % 62.4 43.0 - 77.0 %   Lymphocytes Relative 26.5 12.0 - 46.0 %   Monocytes Relative 7.5 3.0 - 12.0 %   Eosinophils Relative 2.6 0.0 - 5.0 %   Basophils Relative 1.0 0.0 - 3.0 %   Neutro Abs 5.1 1.4 - 7.7 K/uL   Lymphs Abs 2.2 0.7 - 4.0 K/uL   Monocytes Absolute 0.6 0.1 - 1.0 K/uL   Eosinophils Absolute 0.2 0.0 - 0.7 K/uL   Basophils Absolute 0.1 0.0 - 0.1 K/uL     Patient Active Problem List   Diagnosis Date Noted  . Aortic atherosclerosis ([place]) 06/22/2017  . Low back pain 06/21/2017  . Sinus tachycardia 09/21/2014  . Anxiety 07/08/2014  . Malignant neoplasm of lower-outer quadrant of right breast of female, estrogen receptor positive ([place]) 06/19/2014  . Encounter for Medicare annual wellness exam 04/25/2014  . Estrogen deficiency 04/25/2014  . Stress reaction 01/03/2014  . Palpitations 01/03/2014  . Routine general medical examination at a health care facility 02/26/2011  . HYPERCHOLESTEROLEMIA, PURE  02/01/2007  . Essential hypertension 09/08/2006  . ALLERGIC RHINITIS 09/08/2006  . TOBACCO ABUSE, HX OF 09/08/2006   Past Medical History:  Diagnosis Date  . Allergic rhinitis   . Anxiety   . Breast cancer of lower-outer quadrant of right female breast ([place]) 06/19/2014  . Cataract 03/2018   bilateral eyes  . Disorder of bone and cartilage, unspecified   . Family history of malignant neoplasm of breast   . Family history of other kidney diseases   . HLD (hyperlipidemia)   . HTN (hypertension)   . PONV (postoperative nausea and vomiting)   . Tobacco abuse    Past Surgical History:  Procedure Laterality Date  . ABDOMINAL HYSTERECTOMY    . APPENDECTOMY    . BREAST LUMPECTOMY WITH NEEDLE LOCALIZATION AND AXILLARY SENTINEL LYMPH NODE BX Right 07/21/2014   Procedure: BREAST LUMPECTOMY WITH NEEDLE LOCALIZATION AND AXILLARY SENTINEL LYMPH NODE BIOPSY;  Surgeon: Rolm Bookbinder, MD;  Location[place];  Service: General;  Laterality: Right;  . COLONOSCOPY    . DILATION AND CURETTAGE OF UTERUS    . PORT-A-CATH REMOVAL N/A 03/12/2015   Procedure: REMOVAL PORT-A-CATH;  Surgeon: Rolm Bookbinder, MD;  Location: [place];  Service: General;  Laterality: N/A;  . PORTACATH PLACEMENT Right 09/10/2014   Procedure: INSERTION PORT-A-CATH;  Surgeon: Rolm Bookbinder, MD;  Location: [place];  Service: General;  Laterality: Right;  . TOTAL VAGINAL HYSTERECTOMY  1996   endometriosis   Social History   Tobacco Use  . Smoking status: Former Smoker    Packs/day: 1.00    Types: Cigarettes    Last attempt to quit: 06/25/2010    Years since quitting: 7.9  . Smokeless tobacco: Never Used  Substance Use Topics  . Alcohol use: Yes    Alcohol/week: 0.0 standard drinks    Comment: rarely wine/ 2x per year  . Drug use: No   Family History  Problem Relation Age of Onset  . Hypertension Mother   . Kidney disease Father   . Breast cancer Sister 61  . Diabetes Paternal  Aunt    Allergies  Allergen Reactions  . Dust  Mite Extract   . Pollen Extract    Current Outpatient Medications on File Prior to Visit  Medication Sig Dispense Refill  . b complex vitamins tablet Take 1 tablet by mouth daily.     . Calcium Carbonate-Vitamin D (CALCIUM-VITAMIN D) 500-200 MG-UNIT per tablet Take 3 tablets by mouth every morning.      . diphenhydrAMINE (BENADRYL) 25 MG tablet Take 25 mg by mouth daily as needed for allergies.     . Multiple Vitamin (MULTIVITAMIN) tablet Take 1 tablet by mouth daily.      . tamoxifen (NOLVADEX) 20 MG tablet TAKE 1 TABLET BY MOUTH EVERY DAY 90 tablet 2   No current facility-administered medications on file prior to visit.      Review of Systems  Constitutional: Negative for activity change, appetite change, fatigue, fever and unexpected weight change.  HENT: Negative for congestion, ear pain, rhinorrhea, sinus pressure and sore throat.   Eyes: Negative for pain, redness and visual disturbance.  Respiratory: Negative for cough, shortness of breath and wheezing.   Cardiovascular: Negative for chest pain and palpitations.  Gastrointestinal: Negative for abdominal pain, blood in stool, constipation and diarrhea.  Endocrine: Negative for polydipsia and polyuria.  Genitourinary: Negative for dysuria, frequency and urgency.  Musculoskeletal: Negative for arthralgias, back pain and myalgias.  Skin: Negative for pallor and rash.  Allergic/Immunologic: Negative for environmental allergies.  Neurological: Negative for dizziness, syncope and headaches.  Hematological: Negative for adenopathy. Does not bruise/bleed easily.  Psychiatric/Behavioral: Negative for decreased concentration and dysphoric mood. The patient is not nervous/anxious.        Objective:   Physical Exam Constitutional:      General: She is not in acute distress.    Appearance: Normal appearance. She is well-developed and normal weight. She is not ill-appearing.  HENT:      Head: Normocephalic and atraumatic.     Right Ear: Tympanic membrane, ear canal and external ear normal.     Left Ear: Tympanic membrane, ear canal and external ear normal.     Nose: Nose normal.     Mouth/Throat:     Mouth: Mucous membranes are moist.     Pharynx: Oropharynx is clear.  Eyes:     General: No scleral icterus.    Conjunctiva/sclera: Conjunctivae normal.     Pupils: Pupils are equal, round, and reactive to light.  Neck:     Musculoskeletal: Normal range of motion and neck supple.     Thyroid: No thyromegaly.     Vascular: No carotid bruit or JVD.  Cardiovascular:     Rate and Rhythm: Normal rate and regular rhythm.     Pulses: Normal pulses.     Heart sounds: Normal heart sounds. No gallop.   Pulmonary:     Effort: Pulmonary effort is normal. No respiratory distress.     Breath sounds: Normal breath sounds. No wheezing or rales.  Chest:     Chest wall: No tenderness.  Abdominal:     General: Bowel sounds are normal. There is no distension or abdominal bruit.     Palpations: Abdomen is soft. There is no mass.     Tenderness: There is no abdominal tenderness.  Genitourinary:    Comments: Breast exam: No mass, nodules, thickening, tenderness, bulging, retraction, inflamation, nipple discharge or skin changes noted.  No axillary or clavicular LA.     Surgical changes noted in R breast -baseline  Musculoskeletal: Normal range of motion.        General:  No tenderness.     Right lower leg: No edema.     Left lower leg: No edema.  Lymphadenopathy:     Cervical: No cervical adenopathy.  Skin:    General: Skin is warm and dry.     Coloration: Skin is not jaundiced or pale.     Findings: No erythema or rash.  Neurological:     General: No focal deficit present.     Mental Status: She is alert. Mental status is at baseline.     Cranial Nerves: No cranial nerve deficit.     Motor: No abnormal muscle tone.     Coordination: Coordination normal.     Deep Tendon Reflexes:  Reflexes are normal and symmetric. Reflexes normal.  Psychiatric:        Mood and Affect: Mood normal.           Assessment & Plan:   Problem List Items Addressed This Visit      Cardiovascular and Mediastinum   Essential hypertension    bp in fair control at this time  BP Readings from Last 1 Encounters:  05/21/18 116/72   No changes needed Most recent labs reviewed  Disc lifstyle change with low sodium diet and exercise        Relevant Medications   atorvastatin (LIPITOR) 10 MG tablet   lisinopril-hydrochlorothiazide (PRINZIDE,ZESTORETIC) 20-25 MG tablet   metoprolol succinate (TOPROL-XL) 50 MG 24 hr tablet   Aortic atherosclerosis ([facility])    Statin-good lipid control  Good bp  Continue to monitor No symptoms       Relevant Medications   atorvastatin (LIPITOR) 10 MG tablet   lisinopril-hydrochlorothiazide (PRINZIDE,ZESTORETIC) 20-25 MG tablet   metoprolol succinate (TOPROL-XL) 50 MG 24 hr tablet     Other   Routine general medical examination at a health care facility - Primary    Reviewed health habits including diet and exercise and skin cancer prevention Reviewed appropriate screening tests for age  Also reviewed health mt list, fam hx and immunization status , as well as social and family history   See HPI Labs rev  Stressed imp of good self care  Enc her to keep exercising  Will watch bone density on tamoxifen       Malignant neoplasm of lower-outer quadrant of right breast of female, estrogen receptor positive ([place])    Doing well with tamoxifen and regular oncol f/u and mammograms       Relevant Medications   ALPRAZolam (XANAX) 0.5 MG tablet   HYPERCHOLESTEROLEMIA, PURE    Disc goals for lipids and reasons to control them Rev last labs with pt Rev low sat fat diet in detail Continue statin and diet       Relevant Medications   atorvastatin (LIPITOR) 10 MG tablet   lisinopril-hydrochlorothiazide (PRINZIDE,ZESTORETIC) 20-25 MG tablet    metoprolol succinate (TOPROL-XL) 50 MG 24 hr tablet   Anxiety    Refilled xanax for use with caution - dentist/ travel      Relevant Medications   ALPRAZolam (XANAX) 0.5 MG tablet

## 2018-05-21 NOTE — Assessment & Plan Note (Signed)
Statin-good lipid control  Good bp  Continue to monitor No symptoms

## 2018-05-21 NOTE — Assessment & Plan Note (Signed)
Disc goals for lipids and reasons to control them Rev last labs with pt Rev low sat fat diet in detail Continue statin and diet

## 2018-05-21 NOTE — Assessment & Plan Note (Signed)
Reviewed health habits including diet and exercise and skin cancer prevention Reviewed appropriate screening tests for age  Also reviewed health mt list, fam hx and immunization status , as well as social and family history   See HPI Labs rev  Stressed imp of good self care  Enc her to keep exercising  Will watch bone density on tamoxifen

## 2018-07-09 ENCOUNTER — Other Ambulatory Visit: Payer: Self-pay | Admitting: Oncology

## 2018-09-19 ENCOUNTER — Ambulatory Visit (INDEPENDENT_AMBULATORY_CARE_PROVIDER_SITE_OTHER): Payer: Medicare Other | Admitting: Family Medicine

## 2018-09-19 ENCOUNTER — Encounter: Payer: Self-pay | Admitting: Family Medicine

## 2018-09-19 DIAGNOSIS — F419 Anxiety disorder, unspecified: Secondary | ICD-10-CM | POA: Diagnosis not present

## 2018-09-19 DIAGNOSIS — K2970 Gastritis, unspecified, without bleeding: Secondary | ICD-10-CM | POA: Insufficient documentation

## 2018-09-19 DIAGNOSIS — K2900 Acute gastritis without bleeding: Secondary | ICD-10-CM | POA: Diagnosis not present

## 2018-09-19 MED ORDER — FAMOTIDINE 20 MG PO TABS: 20.0000 mg | ORAL_TABLET | Freq: Two times a day (BID) | ORAL | 5 refills | Status: DC

## 2018-09-19 NOTE — Assessment & Plan Note (Signed)
Sharp/burning upper abd pain  Worse with stress/certain foods Adv to avoid caffeine /spicy/carbonation and acid Work on anxiety -relaxation  Sent in pepcid 20 mg to take bid (if unable to get can change to omeprazole 20 mg)  Update if not starting to improve in a week or if worsening

## 2018-09-19 NOTE — Progress Notes (Signed)
Virtual Visit via Video Note  I connected with Jamie Dillon on 09/19/18 at  2:00 PM EDT by a video enabled telemedicine application and verified that I am speaking with the correct person using two identifiers.  Location: Patient: home Provider: office    I discussed the limitations of evaluation and management by telemedicine and the availability of in person appointments. The patient expressed understanding and agreed to proceed.  History of Present Illness: Having stomach issues for 2 weeks Pinching pain on and off in different areas of abdomen Fleeting pains  No heartburn but she has had burning in upper abdomen  Worse after eats  Followed by nausea -no vomiting   Certain foods make her worse- red meat  Soda can bother her  Coffee- does not make it worse   tums seems to help  Took pepto last night - it did help   No h/o ulcer No h/o gastritis   Aspirin -one low dose daily  No other nsaids    She has been anxious and stressed with the pandemic She has been home or outdoors walking  Has been able to sleep   Bowel habits-occ constipation /not bad No diarrhea  No black stool or blood in stool   No sick contacts at all  No fever  No cough (besides allergy clearing throat)    Review of Systems  Constitutional: Negative for chills, diaphoresis, fever, malaise/fatigue and weight loss.  HENT: Negative for sore throat.   Respiratory: Negative for cough and shortness of breath.   Cardiovascular: Negative for chest pain and palpitations.  Gastrointestinal: Positive for abdominal pain and nausea. Negative for blood in stool, constipation, diarrhea, heartburn, melena and vomiting.  Genitourinary: Negative for dysuria.  Skin: Negative for rash.  Neurological: Negative for dizziness.  Endo/Heme/Allergies: Positive for environmental allergies. Does not bruise/bleed easily.  Psychiatric/Behavioral: Negative for depression. The patient is nervous/anxious.     Patient Active  Problem List   Diagnosis Date Noted  . Gastritis 09/19/2018  . Aortic atherosclerosis ([place]) 06/22/2017  . Low back pain 06/21/2017  . Sinus tachycardia 09/21/2014  . Anxiety 07/08/2014  . Malignant neoplasm of lower-outer quadrant of right breast of female, estrogen receptor positive ([place]) 06/19/2014  . Encounter for Medicare annual wellness exam 04/25/2014  . Estrogen deficiency 04/25/2014  . Stress reaction 01/03/2014  . Routine general medical examination at a health care facility 02/26/2011  . HYPERCHOLESTEROLEMIA, PURE 02/01/2007  . Essential hypertension 09/08/2006  . ALLERGIC RHINITIS 09/08/2006  . TOBACCO ABUSE, HX OF 09/08/2006   Past Medical History:  Diagnosis Date  . Allergic rhinitis   . Anxiety   . Breast cancer of lower-outer quadrant of right female breast ([place]) 06/19/2014  . Cataract 03/2018   bilateral eyes  . Disorder of bone and cartilage, unspecified   . Family history of malignant neoplasm of breast   . Family history of other kidney diseases   . HLD (hyperlipidemia)   . HTN (hypertension)   . PONV (postoperative nausea and vomiting)   . Tobacco abuse    Past Surgical History:  Procedure Laterality Date  . ABDOMINAL HYSTERECTOMY    . APPENDECTOMY    . BREAST LUMPECTOMY WITH NEEDLE LOCALIZATION AND AXILLARY SENTINEL LYMPH NODE BX Right 07/21/2014   Procedure: BREAST LUMPECTOMY WITH NEEDLE LOCALIZATION AND AXILLARY SENTINEL LYMPH NODE BIOPSY;  Surgeon: Rolm Bookbinder, MD;  Location: [facility];  Service: General;  Laterality: Right;  . COLONOSCOPY    . DILATION AND CURETTAGE OF UTERUS    .  PORT-A-CATH REMOVAL N/A 03/12/2015   Procedure: REMOVAL PORT-A-CATH;  Surgeon: Rolm Bookbinder, MD;  Location: [facility];  Service: General;  Laterality: N/A;  . PORTACATH PLACEMENT Right 09/10/2014   Procedure: INSERTION PORT-A-CATH;  Surgeon: Rolm Bookbinder, MD;  Location: [facility];  Service: General;  Laterality: Right;  . TOTAL  VAGINAL HYSTERECTOMY  1996   endometriosis   Social History   Tobacco Use  . Smoking status: Former Smoker    Packs/day: 1.00    Types: Cigarettes    Last attempt to quit: 06/25/2010    Years since quitting: 8.2  . Smokeless tobacco: Never Used  Substance Use Topics  . Alcohol use: Yes    Alcohol/week: 0.0 standard drinks    Comment: rarely wine/ 2x per year  . Drug use: No   Family History  Problem Relation Age of Onset  . Hypertension Mother   . Kidney disease Father   . Breast cancer Sister 64  . Diabetes Paternal Aunt    Allergies  Allergen Reactions  . Dust Mite Extract   . Pollen Extract    Current Outpatient Medications on File Prior to Visit  Medication Sig Dispense Refill  . aspirin 81 MG chewable tablet Chew 81 mg by mouth daily.    Marland Kitchen ALPRAZolam (XANAX) 0.5 MG tablet TAKE 1 TABLET BY MOUTH EVERY DAY AS NEEDED 30 tablet 0  . atorvastatin (LIPITOR) 10 MG tablet Take 1 tablet (10 mg total) by mouth daily. 90 tablet 3  . b complex vitamins tablet Take 1 tablet by mouth daily.     . Calcium Carbonate-Vitamin D (CALCIUM-VITAMIN D) 500-200 MG-UNIT per tablet Take 3 tablets by mouth every morning.      . diphenhydrAMINE (BENADRYL) 25 MG tablet Take 25 mg by mouth daily as needed for allergies.     Marland Kitchen lisinopril-hydrochlorothiazide (PRINZIDE,ZESTORETIC) 20-25 MG tablet Take 1 tablet by mouth daily. 90 tablet 3  . metoprolol succinate (TOPROL-XL) 50 MG 24 hr tablet TAKE 1 TABLET EVERY DAY WITH OR IMMEDIATELY FOLLOWING A MEAL 90 tablet 3  . Multiple Vitamin (MULTIVITAMIN) tablet Take 1 tablet by mouth daily.      . tamoxifen (NOLVADEX) 20 MG tablet TAKE 1 TABLET BY MOUTH EVERY DAY 90 tablet 0   No current facility-administered medications on file prior to visit.     Observations/Objective: Patient appears well /her usual self  Pleasant and talkative Mildly anxious  No facial swelling or asymmetry  Not in distress or pain currently  No rash/pallor or skin change Not  hoarse No cough or sob with speech   Assessment and Plan: Problem List Items Addressed This Visit      Digestive   Gastritis    Sharp/burning upper abd pain  Worse with stress/certain foods Adv to avoid caffeine /spicy/carbonation and acid Work on anxiety -relaxation  Sent in pepcid 20 mg to take bid (if unable to get can change to omeprazole 20 mg)  Update if not starting to improve in a week or if worsening          Other   Anxiety - Primary    moreso with current covid situation  Reviewed stressors/ coping techniques/symptoms/ support sources/ tx options and side effects in detail today Enc her to keep getting outdoors/helpful Also talking to husband/family  She will alert [place] if things worsen          Follow Up Instructions: Try pepcid 20 mg twice daily  If not available we can change  to omeprazole 20 mg once daily  Eat bland Avoid spice/caffeine/carbonation/acid Work on relaxation for anxiety  Update if not starting to improve in a week or if worsening   Also if you develop more right sided symptoms    I discussed the assessment and treatment plan with the patient. The patient was provided an opportunity to ask questions and all were answered. The patient agreed with the plan and demonstrated an understanding of the instructions.   The patient was advised to call back or seek an in-person evaluation if the symptoms worsen or if the condition fails to improve as anticipated.     Loura Pardon, MD

## 2018-09-19 NOTE — Patient Instructions (Signed)
Try pepcid 20 mg twice daily  If not available we can change to omeprazole 20 mg once daily  Eat bland Avoid spice/caffeine/carbonation/acid Work on relaxation for anxiety  Update if not starting to improve in a week or if worsening   Also if you develop more right sided symptoms

## 2018-09-19 NOTE — Assessment & Plan Note (Signed)
moreso with current covid situation  Reviewed stressors/ coping techniques/symptoms/ support sources/ tx options and side effects in detail today Enc her to keep getting outdoors/helpful Also talking to husband/family  She will alert [place] if things worsen

## 2018-09-26 ENCOUNTER — Encounter: Payer: Self-pay | Admitting: Family Medicine

## 2018-09-30 ENCOUNTER — Other Ambulatory Visit: Payer: Self-pay | Admitting: Oncology

## 2018-11-13 ENCOUNTER — Other Ambulatory Visit: Payer: Self-pay | Admitting: *Deleted

## 2018-11-13 MED ORDER — ALPRAZOLAM 0.5 MG PO TABS: ORAL_TABLET | ORAL | 0 refills | Status: DC

## 2018-11-13 NOTE — Telephone Encounter (Signed)
Name of Medication: Xanax Name of Pharmacy: [facility] or Written Date and Quantity: 05/21/18 #30 tabs with 0 refills Last Office Visit and Type: GI problems 09/19/18 Next Office Visit and Type: CPE 05/27/19 Last Controlled Substance Agreement Date: 04/28/15 Last UDS:04/28/15

## 2018-12-23 ENCOUNTER — Other Ambulatory Visit: Payer: Self-pay | Admitting: Oncology

## 2019-02-14 ENCOUNTER — Inpatient Hospital Stay: Payer: Medicare Other | Attending: Oncology

## 2019-02-14 ENCOUNTER — Other Ambulatory Visit: Payer: Self-pay

## 2019-02-14 DIAGNOSIS — Z90722 Acquired absence of ovaries, bilateral: Secondary | ICD-10-CM | POA: Insufficient documentation

## 2019-02-14 DIAGNOSIS — C50511 Malignant neoplasm of lower-outer quadrant of right female breast: Secondary | ICD-10-CM

## 2019-02-14 DIAGNOSIS — Z90710 Acquired absence of both cervix and uterus: Secondary | ICD-10-CM | POA: Diagnosis not present

## 2019-02-14 DIAGNOSIS — Z923 Personal history of irradiation: Secondary | ICD-10-CM | POA: Diagnosis not present

## 2019-02-14 DIAGNOSIS — I10 Essential (primary) hypertension: Secondary | ICD-10-CM | POA: Insufficient documentation

## 2019-02-14 DIAGNOSIS — Z79899 Other long term (current) drug therapy: Secondary | ICD-10-CM | POA: Insufficient documentation

## 2019-02-14 DIAGNOSIS — Z79810 Long term (current) use of selective estrogen receptor modulators (SERMs): Secondary | ICD-10-CM | POA: Insufficient documentation

## 2019-02-14 DIAGNOSIS — Z9221 Personal history of antineoplastic chemotherapy: Secondary | ICD-10-CM | POA: Insufficient documentation

## 2019-02-14 DIAGNOSIS — E785 Hyperlipidemia, unspecified: Secondary | ICD-10-CM | POA: Insufficient documentation

## 2019-02-14 DIAGNOSIS — Z87891 Personal history of nicotine dependence: Secondary | ICD-10-CM | POA: Insufficient documentation

## 2019-02-14 DIAGNOSIS — F419 Anxiety disorder, unspecified: Secondary | ICD-10-CM | POA: Diagnosis not present

## 2019-02-14 DIAGNOSIS — C50411 Malignant neoplasm of upper-outer quadrant of right female breast: Secondary | ICD-10-CM | POA: Diagnosis present

## 2019-02-14 DIAGNOSIS — Z170 Estrogen receptor positive status [ER+]: Secondary | ICD-10-CM | POA: Insufficient documentation

## 2019-02-14 DIAGNOSIS — M8580 Other specified disorders of bone density and structure, unspecified site: Secondary | ICD-10-CM | POA: Diagnosis not present

## 2019-02-14 DIAGNOSIS — Z7982 Long term (current) use of aspirin: Secondary | ICD-10-CM | POA: Insufficient documentation

## 2019-02-14 LAB — COMPREHENSIVE METABOLIC PANEL
ALT: 34 U/L (ref 0–44)
AST: 36 U/L (ref 15–41)
Albumin: 4.1 g/dL (ref 3.5–5.0)
Alkaline Phosphatase: 57 U/L (ref 38–126)
Anion gap: 8 (ref 5–15)
BUN: 17 mg/dL (ref 8–23)
CO2: 26 mmol/L (ref 22–32)
Calcium: 9.6 mg/dL (ref 8.9–10.3)
Chloride: 107 mmol/L (ref 98–111)
Creatinine, Ser: 0.9 mg/dL (ref 0.44–1.00)
GFR calc Af Amer: >60 mL/min (ref >60)
GFR calc non Af Amer: >60 mL/min (ref >60)
Glucose, Bld: 87 mg/dL (ref 70–99)
Potassium: 4 mmol/L (ref 3.5–5.1)
Sodium: 141 mmol/L (ref 135–145)
Total Bilirubin: 0.4 mg/dL (ref 0.3–1.2)
Total Protein: 7.4 g/dL (ref 6.5–8.1)

## 2019-02-14 LAB — CBC WITH DIFFERENTIAL/PLATELET
Abs Immature Granulocytes: 0.02 10*3/uL (ref 0.00–0.07)
Basophils Absolute: 0.1 10*3/uL (ref 0.0–0.1)
Basophils Relative: 1 %
Eosinophils Absolute: 0.3 10*3/uL (ref 0.0–0.5)
Eosinophils Relative: 5 %
HCT: 38.8 % (ref 36.0–46.0)
Hemoglobin: 13.1 g/dL (ref 12.0–15.0)
Immature Granulocytes: 0 %
Lymphocytes Relative: 25 %
Lymphs Abs: 1.9 10*3/uL (ref 0.7–4.0)
MCH: 32.8 pg (ref 26.0–34.0)
MCHC: 33.8 g/dL (ref 30.0–36.0)
MCV: 97 fL (ref 80.0–100.0)
Monocytes Absolute: 0.8 10*3/uL (ref 0.1–1.0)
Monocytes Relative: 11 %
Neutro Abs: 4.5 10*3/uL (ref 1.7–7.7)
Neutrophils Relative %: 58 %
Platelets: 255 10*3/uL (ref 150–400)
RBC: 4 MIL/uL (ref 3.87–5.11)
RDW: 12.3 % (ref 11.5–15.5)
WBC: 7.6 10*3/uL (ref 4.0–10.5)
nRBC: 0 % (ref 0.0–0.2)

## 2019-02-20 NOTE — Progress Notes (Signed)
[facility]  [phone] [phone]     ID: Jamie Dillon DOB: [birth year]  MR#: [id number]  LOV#:[id number]  Patient Care Team: Jamie Greenspan, MD as PCP - General Jamie Bookbinder, MD as Consulting Physician (General Surgery) Magrinat, Virgie Dad, MD as Consulting Physician (Oncology) Jamie Gibson, MD as Attending Physician (Radiation Oncology) Jamie Germany, RN as Registered Nurse Jamie Kaufmann, RN as Registered Nurse Jamie Bouche, NP (Inactive) as Nurse Practitioner (Nurse Practitioner) Jamie Cheese, NP as Nurse Practitioner (Nurse Practitioner) OTHER MD:  CHIEF COMPLAINT:  Estrogen receptor positive breast cancer  CURRENT TREATMENT: Tamoxifen   INTERVAL HISTORY: Jamie Dillon returns today for follow-up of her estrogen receptor positive breast cancer.   She continues on tamoxifen.  She has a little bit of hair thinning which could of course be age related.  Tamoxifen can send to here however.  Hot flashes and vaginal wetness are not a major issue for her.  Since her last visit, she underwent bilateral diagnostic mammography with tomography at [facility] on 07/31/2018 showing: breast density category B; no evidence of malignancy in either breast.   REVIEW OF SYSTEMS: Jamie Dillon used to go to the gym all the time but now is taking walks with her husband at least 5 days a week.  They do it out in the park.  They both feel somewhat stressed because of the current pandemic problems.  They are also keeping their 40-year-old grandson at home during the day while he is going to virtual school.  A detailed review of systems today was otherwise noncontributory   BREAST CANCER HISTORY: From the original intake note:  Jamie Dillon had routine screening mammography at [facility] 06/05/2014. The breast density was category B. There was a 6 mm irregular mass in the right breast. On 06/11/2014 the patient underwent right breast ultrasonography at [facility]. This  confirmed a 7 mm all her than wide irregular mass in the right blast at the 8:00 position. 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  The patient's subsequent history is as detailed below   PAST MEDICAL HISTORY: Past Medical History:  Diagnosis Date  . Allergic rhinitis   . Anxiety   . Breast cancer of lower-outer quadrant of right female breast ([place]) 06/19/2014  . Cataract 03/2018   bilateral eyes  . Disorder of bone and cartilage, unspecified   . Family history of malignant neoplasm of breast   . Family history of other kidney diseases   . HLD (hyperlipidemia)   . HTN (hypertension)   . PONV (postoperative nausea and vomiting)   . Tobacco abuse     PAST SURGICAL HISTORY: Past Surgical History:  Procedure Laterality Date  . ABDOMINAL HYSTERECTOMY    . APPENDECTOMY    . BREAST LUMPECTOMY WITH NEEDLE LOCALIZATION AND AXILLARY SENTINEL LYMPH NODE BX Right 07/21/2014   Procedure: BREAST LUMPECTOMY WITH NEEDLE LOCALIZATION AND AXILLARY SENTINEL LYMPH NODE BIOPSY;  Surgeon: Jamie Bookbinder, MD;  Location: [facility];  Service: General;  Laterality: Right;  . COLONOSCOPY    . DILATION AND CURETTAGE OF UTERUS    . PORT-A-CATH REMOVAL N/A 03/12/2015   Procedure: REMOVAL PORT-A-CATH;  Surgeon: Jamie Bookbinder, MD;  Location: [facility];  Service: General;  Laterality: N/A;  .  PORTACATH PLACEMENT Right 09/10/2014   Procedure: INSERTION PORT-A-CATH;  Surgeon: Jamie Bookbinder, MD;  Location: [facility];  Service: General;  Laterality: Right;  . TOTAL VAGINAL HYSTERECTOMY  1996   endometriosis    FAMILY HISTORY Family History  Problem Relation Age of Onset  . Hypertension  Mother   . Kidney disease Father   . Breast cancer Sister 78  . Diabetes Paternal Aunt    the patient's father died from kidney failure at the age of 108. The patient's mother is still living at age 39. The patient had no brothers, one sister. That sister was diagnosed with breast cancer at age 18. The patient's father's mother was diagnosed with mouth cancer at the age of 30.   GYNECOLOGIC HISTORY:  No LMP recorded. Patient has had a hysterectomy. Menarche age 57, first live birth age 67. The patient is GX P2. She had a total abdominal hysterectomy with bilateral salpingo-oophorectomy in 1996. She took hormone replacement for approximately 10 years, until 2009. She also took oral contraceptives for approximately 7 years remotely, with no complications   SOCIAL HISTORY:  The patient is currently retired, though she still works part-time at the [employer] Her husband "Jamie Dillon" Leshia Kope is retired from Press photographer. Son Jamie Dillon "Jamie Dillon" Jahn teaches English at [employer] in [place]. Daughter Jamie Dillon lives in [place] . She works as a Marine scientist at the [employer]: [place]: Social History   Tobacco Use  . Smoking status: Former Smoker    Packs/day: 1.00    Types: Cigarettes    Quit date: 06/25/2010    Years since quitting: 8.6  . Smokeless tobacco: Never Used  Substance Use Topics  . Alcohol use: Yes    Alcohol/week: 0.0 standard drinks    Comment: rarely wine/ 2x per year  . Drug use: No     Colonoscopy: 2010  PAP:  Bone density: December 2015  Lipid panel:  Allergies  Allergen Reactions  . Dust Mite Extract   . Pollen Extract     Current Outpatient Medications  Medication Sig Dispense Refill  . ALPRAZolam (XANAX) 0.5 MG tablet TAKE 1 TABLET BY MOUTH EVERY DAY AS NEEDED 30 tablet 0  . aspirin 81 MG chewable tablet Chew 81 mg by mouth daily.    Marland Kitchen atorvastatin (LIPITOR) 10 MG tablet  Take 1 tablet (10 mg total) by mouth daily. 90 tablet 3  . b complex vitamins tablet Take 1 tablet by mouth daily.     . Calcium Carbonate-Vitamin D (CALCIUM-VITAMIN D) 500-200 MG-UNIT per tablet Take 3 tablets by mouth every morning.      . diphenhydrAMINE (BENADRYL) 25 MG tablet Take 25 mg by mouth daily as needed for allergies.     . famotidine (PEPCID) 20 MG tablet Take 1 tablet (20 mg total) by mouth 2 (two) times daily. 60 tablet 5  . lisinopril-hydrochlorothiazide (PRINZIDE,ZESTORETIC) 20-25 MG tablet Take 1 tablet by mouth daily. 90 tablet 3  . metoprolol succinate (TOPROL-XL) 50 MG 24 hr tablet TAKE 1 TABLET EVERY DAY WITH OR IMMEDIATELY FOLLOWING A MEAL 90 tablet 3  . Multiple Vitamin (MULTIVITAMIN) tablet Take 1 tablet by mouth daily.      . tamoxifen (NOLVADEX) 20 MG tablet Take 1 tablet (20 mg total) by mouth daily. 90 tablet 4   No current facility-administered medications for this visit.     OBJECTIVE: Middle-aged white woman in no acute distress  Vitals:   02/21/19 1054  BP: 132/83  Pulse: 76  Resp: 18  Temp: 98.8 F (37.1 C)  SpO2: 99%     Body mass index is 26.74 kg/m.    ECOG FS:1 - Symptomatic but completely ambulatory  Sclerae unicteric, EOMs intact Wearing a mask No cervical or supraclavicular adenopathy Lungs no rales or rhonchi Heart regular rate and rhythm Abd soft, nontender, positive bowel sounds MSK no focal spinal tenderness, no upper extremity lymphedema Neuro: nonfocal, well oriented, appropriate affect Breasts: On the right the breast is status post lumpectomy followed by radiation.  There is no evidence of disease recurrence.  The left breast is benign.  Both axillae are benign.   LAB RESULTS:  CMP     Component Value Date/Time   NA 141 02/14/2019 1113   NA 141 02/15/2017 1149   K 4.0 02/14/2019 1113   K 3.6 02/15/2017 1149   CL 107 02/14/2019 1113   CO2 26 02/14/2019 1113   CO2 25 02/15/2017 1149   GLUCOSE 87 02/14/2019 1113    GLUCOSE 82 02/15/2017 1149   BUN 17 02/14/2019 1113   BUN 16.2 02/15/2017 1149   CREATININE 0.90 02/14/2019 1113   CREATININE 0.9 02/15/2017 1149   CALCIUM 9.6 02/14/2019 1113   CALCIUM 10.1 02/15/2017 1149   PROT 7.4 02/14/2019 1113   PROT 7.2 02/15/2017 1149   ALBUMIN 4.1 02/14/2019 1113   ALBUMIN 4.0 02/15/2017 1149   AST 36 02/14/2019 1113   AST 26 02/15/2017 1149   ALT 34 02/14/2019 1113   ALT 27 02/15/2017 1149   ALKPHOS 57 02/14/2019 1113   ALKPHOS 55 02/15/2017 1149   BILITOT 0.4 02/14/2019 1113   BILITOT 0.50 02/15/2017 1149   GFRNONAA >60 02/14/2019 1113   GFRAA >60 02/14/2019 1113    INo results found for: SPEP, UPEP  Lab Results  Component Value Date   WBC 7.6 02/14/2019   NEUTROABS 4.5 02/14/2019   HGB 13.1 02/14/2019   HCT 38.8 02/14/2019   MCV 97.0 02/14/2019   PLT 255 02/14/2019      Chemistry      Component Value Date/Time   NA 141 02/14/2019 1113   NA 141 02/15/2017 1149   K 4.0 02/14/2019 1113   K 3.6 02/15/2017 1149   CL 107 02/14/2019 1113   CO2 26 02/14/2019 1113   CO2 25 02/15/2017 1149   BUN 17 02/14/2019 1113   BUN 16.2 02/15/2017 1149   CREATININE 0.90 02/14/2019 1113   CREATININE 0.9 02/15/2017 1149      Component Value Date/Time   CALCIUM 9.6 02/14/2019 1113   CALCIUM 10.1 02/15/2017 1149   ALKPHOS 57 02/14/2019 1113   ALKPHOS 55 02/15/2017 1149   AST 36 02/14/2019 1113   AST 26 02/15/2017 1149   ALT 34 02/14/2019 1113   ALT 27 02/15/2017 1149   BILITOT 0.4 02/14/2019 1113   BILITOT 0.50 02/15/2017 1149       No results found for: LABCA2  No components found for: LABCA125  No results for input(s): INR in the last 168 hours.  Urinalysis    Component Value Date/Time   COLORURINE YELLOW 09/21/2014 1809   APPEARANCEUR CLOUDY (A) 09/21/2014 1809   LABSPEC 1.009 09/21/2014 1809   PHURINE 7.0 09/21/2014 1809   GLUCOSEU NEGATIVE 09/21/2014 1809   HGBUR MODERATE (A) 09/21/2014 1809   HGBUR negative 02/13/2008 0911    BILIRUBINUR Neg. 10/15/2014 [place] 09/21/2014 1809   PROTEINUR Neg. 10/15/2014 1248  PROTEINUR 30 (A) 09/21/2014 1809   UROBILINOGEN 0.2 10/15/2014 1248   UROBILINOGEN 0.2 09/21/2014 1809   NITRITE Neg. 10/15/2014 1248   NITRITE POSITIVE (A) 09/21/2014 1809   LEUKOCYTESUR Negative 10/15/2014 1248    STUDIES: No results found.   ASSESSMENT: 68 y.o. [place] woman status post right breastLower outer quadrant biopsy 06/17/2014 for a clinical T1b N0, stage I invasive ductal carcinoma, grade 2, estrogen receptor strongly positive, progesterone receptor moderately positive, with no HER-2 amplification and an MIB-1 of 22%.  (1) status post right lumpectomy and right axillary sentinel lymph node sampling 07/21/2014 for a pT1b pN1a, stage IIA invasive ductal carcinoma, grade 1, with negative margins, repeat HER-2/neu again negative.  (2) adjuvant chemotherapy consisting of cyclophosphamide and docetaxel started 09/15/2014, given every 3 weeks with Neulasta support, completed 11/18/2014  (a) Neutropenic fever and hospitalization 5 days after the first cycle: subsequent cycles 10% dose reduced  (3) adjuvant radiation 12/10/2014-01/21/2015: Right breast/ axilla // 50 Gy in 25 fractions Right breast boost / 10 Gy in 5 fractions  (4) started tamoxifen 02/14/2015  (5) osteopenia: bone density January 2016 shows a T score of -1.1 at the femoral neck  (a) bone density 05/23/2017 shows a T score of -0.7 at the femoral neck (normal)  PLAN: Rogelio is now 4-1/2 years out from definitive surgery for her breast cancer with no evidence of disease recurrence.  This is very favorable.  She is tolerating tamoxifen generally well.  It could be that her hair thinning is partly due to tamoxifen.  That should resolve or improve with time if that is the case since she only has 1 more year to go on this drug.  She will see me one last time a year from now.  She knows to call for any other issue  that may develop before that visit.  Magrinat, Virgie Dad, MD  02/21/19 11:18 AM Medical Oncology and Hematology [facility] [address] [place], [place] [address] Tel. [phone]    Fax. [phone]    I, Wilburn Mylar, am acting as scribe for Dr. Virgie Dillon. Magrinat.  I, Lurline Del MD, have reviewed the above documentation for accuracy and completeness, and I agree with the above.

## 2019-02-21 ENCOUNTER — Other Ambulatory Visit: Payer: Self-pay

## 2019-02-21 ENCOUNTER — Inpatient Hospital Stay (HOSPITAL_BASED_OUTPATIENT_CLINIC_OR_DEPARTMENT_OTHER): Payer: Medicare Other | Admitting: Oncology

## 2019-02-21 VITALS — BP 132/83 | HR 76 | Temp 98.8°F | Resp 18 | Wt 139.2 lb

## 2019-02-21 DIAGNOSIS — C50511 Malignant neoplasm of lower-outer quadrant of right female breast: Secondary | ICD-10-CM | POA: Diagnosis not present

## 2019-02-21 DIAGNOSIS — Z87891 Personal history of nicotine dependence: Secondary | ICD-10-CM

## 2019-02-21 DIAGNOSIS — E7800 Pure hypercholesterolemia, unspecified: Secondary | ICD-10-CM

## 2019-02-21 DIAGNOSIS — E2839 Other primary ovarian failure: Secondary | ICD-10-CM | POA: Diagnosis not present

## 2019-02-21 DIAGNOSIS — C50411 Malignant neoplasm of upper-outer quadrant of right female breast: Secondary | ICD-10-CM | POA: Diagnosis not present

## 2019-02-21 DIAGNOSIS — Z170 Estrogen receptor positive status [ER+]: Secondary | ICD-10-CM

## 2019-02-21 MED ORDER — TAMOXIFEN CITRATE 20 MG PO TABS: 20.0000 mg | ORAL_TABLET | Freq: Every day | ORAL | 4 refills | Status: DC

## 2019-02-22 ENCOUNTER — Telehealth: Payer: Self-pay | Admitting: Oncology

## 2019-02-22 NOTE — Telephone Encounter (Signed)
I talk with patient regarding schedule  

## 2019-02-26 ENCOUNTER — Other Ambulatory Visit: Payer: Self-pay | Admitting: Family Medicine

## 2019-02-26 NOTE — Telephone Encounter (Signed)
Name of Medication: Xanax Name of Pharmacy: [facility] or Written Date and Quantity: 11/13/18 #30 tabs with 0 refills Last Office Visit and Type: GI problems 09/19/18 Next Office Visit and Type: CPE 05/27/19 Last Controlled Substance Agreement Date: 04/28/15 Last UDS:04/28/15

## 2019-03-01 ENCOUNTER — Encounter: Payer: Self-pay | Admitting: Gastroenterology

## 2019-03-29 ENCOUNTER — Other Ambulatory Visit: Payer: Self-pay

## 2019-03-29 ENCOUNTER — Encounter: Payer: Self-pay | Admitting: Family Medicine

## 2019-03-29 ENCOUNTER — Ambulatory Visit (INDEPENDENT_AMBULATORY_CARE_PROVIDER_SITE_OTHER): Payer: Medicare Other | Admitting: Family Medicine

## 2019-03-29 ENCOUNTER — Encounter: Payer: Self-pay | Admitting: Gastroenterology

## 2019-03-29 VITALS — BP 138/72 | HR 102 | Temp 98.3°F | Ht 60.5 in | Wt 139.2 lb

## 2019-03-29 DIAGNOSIS — K2900 Acute gastritis without bleeding: Secondary | ICD-10-CM

## 2019-03-29 MED ORDER — OMEPRAZOLE 20 MG PO CPDR: 20.0000 mg | DELAYED_RELEASE_CAPSULE | Freq: Two times a day (BID) | ORAL | 5 refills | Status: DC

## 2019-03-29 NOTE — Progress Notes (Signed)
Subjective:    Patient ID: Jamie Dillon, female    DOB: [birth year], 68 y.o.   MRN: [id number]  HPI F/u for GI issues   Seen 5/20 for gastritis  We started her on pepcid 20 mg bid -it helped just a little We then adv change to omeprazole 20 mg daily   That was working for a while   Lately symptoms are worse  Now she feels bloated  Fills up fast  Still a lot of burping and gassiness   Pain: burning across upper abdomen  Last night more pain on R side   Last night ate [place] sub  Had oil/vinegar and banana peppers and cheese    No black stool  bms have been normal   She took an antacid helped last night briefly   She has not changed her diet  Probably needs to  Needs to eat less and possibly more bland  She drinks occ ginger ale  Coffee 2 cups per day - ? If it bothers her   Takes nsaids - ibuprofen (2 weeks ago took it daily for 2 weeks)  None in a week now    Wt Readings from Last 3 Encounters:  03/29/19 139 lb 4 oz (63.2 kg)  02/21/19 139 lb 3.2 oz (63.1 kg)  05/21/18 136 lb (61.7 kg)  wt is stable  26.75 kg/m   Patient Active Problem List   Diagnosis Date Noted   Gastritis 09/19/2018   Aortic atherosclerosis ([place]) 06/22/2017   Low back pain 06/21/2017   Sinus tachycardia 09/21/2014   Anxiety 07/08/2014   Malignant neoplasm of lower-outer quadrant of right breast of female, estrogen receptor positive ([place]) 06/19/2014   Encounter for Medicare annual wellness exam 04/25/2014   Estrogen deficiency 04/25/2014   Stress reaction 01/03/2014   Routine general medical examination at a health care facility 02/26/2011   HYPERCHOLESTEROLEMIA, PURE 02/01/2007   Essential hypertension 09/08/2006   ALLERGIC RHINITIS 09/08/2006   TOBACCO ABUSE, HX OF 09/08/2006   Past Medical History:  Diagnosis Date   Allergic rhinitis    Anxiety    Breast cancer of lower-outer quadrant of right female breast ([place]) 06/19/2014   Cataract 03/2018   bilateral  eyes   Disorder of bone and cartilage, unspecified    Family history of malignant neoplasm of breast    Family history of other kidney diseases    HLD (hyperlipidemia)    HTN (hypertension)    PONV (postoperative nausea and vomiting)    Tobacco abuse    Past Surgical History:  Procedure Laterality Date   ABDOMINAL HYSTERECTOMY     APPENDECTOMY     BREAST LUMPECTOMY WITH NEEDLE LOCALIZATION AND AXILLARY SENTINEL LYMPH NODE BX Right 07/21/2014   Procedure: BREAST LUMPECTOMY WITH NEEDLE LOCALIZATION AND AXILLARY SENTINEL LYMPH NODE BIOPSY;  Surgeon: Rolm Bookbinder, MD;  Location: [facility];  Service: General;  Laterality: Right;   COLONOSCOPY     DILATION AND CURETTAGE OF UTERUS     PORT-A-CATH REMOVAL N/A 03/12/2015   Procedure: REMOVAL PORT-A-CATH;  Surgeon: Rolm Bookbinder, MD;  Location: [facility];  Service: General;  Laterality: N/A;   PORTACATH PLACEMENT Right 09/10/2014   Procedure: INSERTION PORT-A-CATH;  Surgeon: Rolm Bookbinder, MD;  Location: [facility];  Service: General;  Laterality: Right;   TOTAL VAGINAL HYSTERECTOMY  1996   endometriosis   Social History   Tobacco Use   Smoking status: Former Smoker    Packs/day: 1.00    Types: Cigarettes  Quit date: 06/25/2010    Years since quitting: 8.7   Smokeless tobacco: Never Used  Substance Use Topics   Alcohol use: Yes    Alcohol/week: 0.0 standard drinks    Comment: rarely wine/ 2x per year   Drug use: No   Family History  Problem Relation Age of Onset   Hypertension Mother    Kidney disease Father    Breast cancer Sister 47   Diabetes Paternal Aunt    Allergies  Allergen Reactions   D[place]ite Extract    Pollen Extract    Current Outpatient Medications on File Prior to Visit  Medication Sig Dispense Refill   ALPRAZolam (XANAX) 0.5 MG tablet TAKE 1 TABLET BY MOUTH EVERY DAY AS NEEDED 30 tablet 0   aspirin 81 MG chewable tablet Chew 81 mg by mouth  daily.     atorvastatin (LIPITOR) 10 MG tablet Take 1 tablet (10 mg total) by mouth daily. 90 tablet 3   b complex vitamins tablet Take 1 tablet by mouth daily.      Calcium Carbonate-Vitamin D (CALCIUM-VITAMIN D) 500-200 MG-UNIT per tablet Take 3 tablets by mouth every morning.       diphenhydrAMINE (BENADRYL) 25 MG tablet Take 25 mg by mouth daily as needed for allergies.      lisinopril-hydrochlorothiazide (PRINZIDE,ZESTORETIC) 20-25 MG tablet Take 1 tablet by mouth daily. 90 tablet 3   metoprolol succinate (TOPROL-XL) 50 MG 24 hr tablet TAKE 1 TABLET EVERY DAY WITH OR IMMEDIATELY FOLLOWING A MEAL 90 tablet 3   Multiple Vitamin (MULTIVITAMIN) tablet Take 1 tablet by mouth daily.       tamoxifen (NOLVADEX) 20 MG tablet Take 1 tablet (20 mg total) by mouth daily. 90 tablet 4   No current facility-administered medications on file prior to visit.      Review of Systems  Constitutional: Negative for activity change, appetite change, fatigue, fever and unexpected weight change.  HENT: Negative for congestion, ear pain, rhinorrhea, sinus pressure and sore throat.   Eyes: Negative for pain, redness and visual disturbance.  Respiratory: Negative for cough, shortness of breath and wheezing.   Cardiovascular: Negative for chest pain and palpitations.  Gastrointestinal: Positive for abdominal distention and abdominal pain. Negative for blood in stool, constipation, diarrhea, nausea, rectal pain and vomiting.       Burping  Feels bloated  Endocrine: Negative for polydipsia and polyuria.  Genitourinary: Negative for dysuria, frequency and urgency.  Musculoskeletal: Negative for arthralgias, back pain and myalgias.  Skin: Negative for pallor and rash.  Allergic/Immunologic: Negative for environmental allergies.  Neurological: Negative for dizziness, syncope and headaches.  Hematological: Negative for adenopathy. Does not bruise/bleed easily.  Psychiatric/Behavioral: Negative for decreased  concentration and dysphoric mood. The patient is not nervous/anxious.        Objective:   Physical Exam Constitutional:      General: She is not in acute distress.    Appearance: Normal appearance. She is well-developed and normal weight. She is not ill-appearing.  HENT:     Head: Normocephalic and atraumatic.     Mouth/Throat:     Mouth: Mucous membranes are moist.  Eyes:     General: No scleral icterus.    Conjunctiva/sclera: Conjunctivae normal.     Pupils: Pupils are equal, round, and reactive to light.  Neck:     Musculoskeletal: Normal range of motion and neck supple.  Cardiovascular:     Rate and Rhythm: Normal rate and regular rhythm.     Heart sounds:  Normal heart sounds.  Pulmonary:     Effort: Pulmonary effort is normal. No respiratory distress.     Breath sounds: Normal breath sounds. No wheezing or rales.  Abdominal:     General: Abdomen is flat. Bowel sounds are normal. There is no distension or abdominal bruit.     Palpations: Abdomen is soft. There is no mass.     Tenderness: There is abdominal tenderness in the epigastric area and left upper quadrant. There is no guarding or rebound. Negative signs include Murphy's sign.     Hernia: No hernia is present.  Lymphadenopathy:     Cervical: No cervical adenopathy.  Skin:    General: Skin is warm and dry.     Coloration: Skin is not pale.     Findings: No erythema.  Neurological:     Mental Status: She is alert.           Assessment & Plan:   Problem List Items Addressed This Visit      Digestive   Gastritis - Primary    Well controlled for a while with omeprazole 20 mg  Symptoms have returned -possibly due to a few weeks of ibuprofen  inst to hold all nsaids Diet handout given  Eat small portions/ bland diet for now  Avoid caffeine  Update if not starting to improve in a week or if worsening   If no improvement may need GI eval/EGD

## 2019-03-29 NOTE — Patient Instructions (Addendum)
Stop all nsaids (like ibuprofen)   Take your low dose aspirin with meal    Eat bland for the next 2 weeks-small portions Less coffee if you can  Lots of water  Increase omeprazole to twice daily   In 2 weeks if you are not much better please let me know

## 2019-03-30 NOTE — Assessment & Plan Note (Signed)
Well controlled for a while with omeprazole 20 mg  Symptoms have returned -possibly due to a few weeks of ibuprofen  inst to hold all nsaids Diet handout given  Eat small portions/ bland diet for now  Avoid caffeine  Update if not starting to improve in a week or if worsening   If no improvement may need GI eval/EGD

## 2019-04-09 ENCOUNTER — Other Ambulatory Visit: Payer: Self-pay

## 2019-04-09 ENCOUNTER — Encounter: Payer: Self-pay | Admitting: Gastroenterology

## 2019-04-09 ENCOUNTER — Ambulatory Visit (AMBULATORY_SURGERY_CENTER): Payer: Medicare Other | Admitting: *Deleted

## 2019-04-09 VITALS — Temp 98.2°F | Ht 60.5 in | Wt 140.4 lb

## 2019-04-09 DIAGNOSIS — Z1159 Encounter for screening for other viral diseases: Secondary | ICD-10-CM

## 2019-04-09 DIAGNOSIS — Z1211 Encounter for screening for malignant neoplasm of colon: Secondary | ICD-10-CM

## 2019-04-09 NOTE — Progress Notes (Signed)
No egg or soy allergy known to patient  No issues with past sedation with any surgeries  or procedures, no intubation problems  No diet pills per patient No home 02 use per patient  No blood thinners per patient  Pt denies issues with constipation  No A fib or A flutter  EMMI video sent to pt's e mail   cov test 12-2 WED 930 am   Due to the COVID-19 pandemic we are asking patients to follow these guidelines. Please only bring one care partner. Please be aware that your care partner may wait in the car in the parking lot or if they feel like they will be too hot to wait in the car, they may wait in the lobby on the 4th floor. All care partners are required to wear a mask the entire time (we do not have any that we can provide them), they need to practice social distancing, and we will do a Covid check for all patient's and care partners when you arrive. Also we will check their temperature and your temperature. If the care partner waits in their car they need to stay in the parking lot the entire time and we will call them on their cell phone when the patient is ready for discharge so they can bring the car to the front of the building. Also all patient's will need to wear a mask into building.

## 2019-04-17 ENCOUNTER — Ambulatory Visit (INDEPENDENT_AMBULATORY_CARE_PROVIDER_SITE_OTHER): Payer: Medicare Other

## 2019-04-17 ENCOUNTER — Other Ambulatory Visit: Payer: Self-pay | Admitting: Gastroenterology

## 2019-04-17 DIAGNOSIS — Z1159 Encounter for screening for other viral diseases: Secondary | ICD-10-CM

## 2019-04-18 LAB — SARS CORONAVIRUS 2 (TAT 6-24 HRS): SARS Coronavirus 2: NEGATIVE

## 2019-04-22 ENCOUNTER — Ambulatory Visit (AMBULATORY_SURGERY_CENTER): Payer: Medicare Other | Admitting: Gastroenterology

## 2019-04-22 ENCOUNTER — Other Ambulatory Visit: Payer: Self-pay

## 2019-04-22 ENCOUNTER — Encounter: Payer: Self-pay | Admitting: Gastroenterology

## 2019-04-22 VITALS — BP 103/53 | HR 67 | Temp 98.0°F | Resp 18 | Ht 60.5 in | Wt 140.4 lb

## 2019-04-22 DIAGNOSIS — Z1211 Encounter for screening for malignant neoplasm of colon: Secondary | ICD-10-CM | POA: Diagnosis not present

## 2019-04-22 MED ORDER — SODIUM CHLORIDE 0.9 % IV SOLN: 500.0000 mL | Freq: Once | INTRAVENOUS | Status: DC

## 2019-04-22 NOTE — Op Note (Signed)
[facility] Patient Name: Jamie Dillon Procedure Date: 04/22/2019 9:32 AM MRN: [id number] Endoscopist: Thornton Park MD, MD Age: 68 Referring MD:  Date of Birth: [birth year] Gender: Female Account #: [id number] Procedure:                Colonoscopy Indications:              Screening for colorectal malignant neoplasm                           Normal screening colonoscopy with Dr. Deatra Ina in 2010                           No known family history of colon cancer or polyps. Medicines:                Monitored Anesthesia Care Procedure:                Pre-Anesthesia Assessment:                           - Prior to the procedure, a History and Physical                            was performed, and patient medications and                            allergies were reviewed. The patient's tolerance of                            previous anesthesia was also reviewed. The risks                            and benefits of the procedure and the sedation                            options and risks were discussed with the patient.                            All questions were answered, and informed consent                            was obtained. Prior Anticoagulants: The patient has                            taken no previous anticoagulant or antiplatelet                            agents. ASA Grade Assessment: II - A patient with                            mild systemic disease. After reviewing the risks                            and benefits, the patient was deemed in  satisfactory condition to undergo the procedure.                           After obtaining informed consent, the colonoscope                            was passed under direct vision. Throughout the                            procedure, the patient's blood pressure, pulse, and                            oxygen saturations were monitored continuously. The                            Colonoscope  was introduced through the anus and                            advanced to the the cecum, identified by                            appendiceal orifice and ileocecal valve. A second                            forward view of the right colon was performed. The                            colonoscopy was performed with difficulty due to a                            redundant colon, significant looping and a tortuous                            colon. Successful completion of the procedure was                            aided by applying abdominal pressure. The patient                            tolerated the procedure well. The quality of the                            bowel preparation was good. The ileocecal valve,                            appendiceal orifice, and rectum were photographed. Scope In: 9:46:42 AM Scope Out: 10:01:39 AM Scope Withdrawal Time: 0 hours 8 minutes 53 seconds  Total Procedure Duration: 0 hours 14 minutes 57 seconds  Findings:                 The perianal and digital rectal examinations were                            normal.  Multiple small and large-mouthed diverticula were                            found in the entire colon. The diverticulosis is                            most dense in the sigmoid colon, descending colon,                            and distal ascending colon.                           Non-bleeding internal hemorrhoids were found.                           The exam was otherwise without abnormality on                            direct and retroflexion views. Complications:            No immediate complications. Estimated Blood Loss:     Estimated blood loss: none. Impression:               - Diverticulosis in the entire examined colon.                           - Non-bleeding internal hemorrhoids.                           - The examination was otherwise normal on direct                            and retroflexion  views.                           - No specimens collected. Recommendation:           - Patient has a contact number available for                            emergencies. The signs and symptoms of potential                            delayed complications were discussed with the                            patient. Return to normal activities tomorrow.                            Written discharge instructions were provided to the                            patient.                           - High fiber diet.                           -  Continue present medications.                           - Repeat colonoscopy in 10 years for screening                            purposes if it is clinically appropriate at that                            time. Thornton Park MD, MD 04/22/2019 10:08:08 AM This report has been signed electronically.

## 2019-04-22 NOTE — Patient Instructions (Signed)
HANDOUTS PROVIDED ON: DIVERTICULOSIS, HEMORRHOIDS, & HIGH FIBER DIET   YOU MAY RESUME YOUR PREVIOUS MEDICATIONS.  PLEASE FOLLOW A HIGH FIBER DIET.  [place] YOU FOR ALLOWING [place] TO CARE FOR YOU TODAY!!!  YOU HAD AN ENDOSCOPIC PROCEDURE TODAY AT [facility]:   Refer to the procedure report that was given to you for any specific questions about what was found during the examination.  If the procedure report does not answer your questions, please call your gastroenterologist to clarify.  If you requested that your care partner not be given the details of your procedure findings, then the procedure report has been included in a sealed envelope for you to review at your convenience later.  YOU SHOULD EXPECT: Some feelings of bloating in the abdomen. Passage of more gas than usual.  Walking can help get rid of the air that was put into your GI tract during the procedure and reduce the bloating. If you had a lower endoscopy (such as a colonoscopy or flexible sigmoidoscopy) you may notice spotting of blood in your stool or on the toilet paper. If you underwent a bowel prep for your procedure, you may not have a normal bowel movement for a few days.  Please Note:  You might notice some irritation and congestion in your nose or some drainage.  This is from the oxygen used during your procedure.  There is no need for concern and it should clear up in a day or so.  SYMPTOMS TO REPORT IMMEDIATELY:   Following lower endoscopy (colonoscopy or flexible sigmoidoscopy):  Excessive amounts of blood in the stool  Significant tenderness or worsening of abdominal pains  Swelling of the abdomen that is new, acute  Fever of 100F or higher  For urgent or emergent issues, a gastroenterologist can be reached at any hour by calling [phone].   DIET:  We do recommend a small meal at first, but then you may proceed to your regular diet.  Drink plenty of fluids but you should avoid alcoholic beverages  for 24 hours.  ACTIVITY:  You should plan to take it easy for the rest of today and you should NOT DRIVE or use heavy machinery until tomorrow (because of the sedation medicines used during the test).    FOLLOW UP: Our staff will call the number listed on your records 48-72 hours following your procedure to check on you and address any questions or concerns that you may have regarding the information given to you following your procedure. If we do not reach you, we will leave a message.  We will attempt to reach you two times.  During this call, we will ask if you have developed any symptoms of COVID 19. If you develop any symptoms (ie: fever, flu-like symptoms, shortness of breath, cough etc.) before then, please call [phone].  If you test positive for Covid 19 in the 2 weeks post procedure, please call and report this information to [place].    If any biopsies were taken you will be contacted by phone or by letter within the next 1-3 weeks.  Please call us at [phone] if you have not heard about the biopsies in 3 weeks.    SIGNATURES/CONFIDENTIALITY: You and/or your care partner have signed paperwork which will be entered into your electronic medical record.  These signatures attest to the fact that that the information above on your After Visit Summary has been reviewed and is understood.  Full responsibility of the confidentiality of this  discharge information lies with you and/or your care-partner. 

## 2019-04-22 NOTE — Progress Notes (Signed)
Pt's states no medical or surgical changes since previsit or office visit.  Temp-jb  V/s-cw 

## 2019-04-22 NOTE — Progress Notes (Signed)
Report given to PACU, vss 

## 2019-04-24 ENCOUNTER — Telehealth: Payer: Self-pay

## 2019-04-24 NOTE — Telephone Encounter (Signed)
  Follow up Call-  Call back number 04/22/2019  Post procedure Call Back phone  # [phone]  Permission to leave phone message Yes  Some recent data might be hidden     Patient questions:  Do you have a fever, pain , or abdominal swelling? No. Pain Score  0 *  Have you tolerated food without any problems? Yes.    Have you been able to return to your normal activities? Yes.    Do you have any questions about your discharge instructions: Diet   No. Medications  No. Follow up visit  No.  Do you have questions or concerns about your Care? No.  Actions: * If pain score is 4 or above: No action needed, pain <4.  1. Have you developed a fever since your procedure? no  2.   Have you had an respiratory symptoms (SOB or cough) since your procedure? no  3.   Have you tested positive for COVID 19 since your procedure no  4.   Have you had any family members/close contacts diagnosed with the COVID 19 since your procedure?  no   If yes to any of these questions please route to Joylene John, RN and Alphonsa Gin, Therapist, sports.

## 2019-04-30 ENCOUNTER — Other Ambulatory Visit: Payer: Self-pay | Admitting: Family Medicine

## 2019-05-19 ENCOUNTER — Telehealth: Payer: Self-pay | Admitting: Family Medicine

## 2019-05-19 DIAGNOSIS — E7800 Pure hypercholesterolemia, unspecified: Secondary | ICD-10-CM

## 2019-05-19 DIAGNOSIS — I10 Essential (primary) hypertension: Secondary | ICD-10-CM

## 2019-05-19 NOTE — Telephone Encounter (Signed)
-----   Message from Ellamae Sia sent at 05/09/2019  9:44 AM EST ----- Regarding: Lab orders for Monday, 1.4.21 Patient is scheduled for CPX labs, please order future labs, Thanks , Karna Christmas

## 2019-05-20 ENCOUNTER — Ambulatory Visit (INDEPENDENT_AMBULATORY_CARE_PROVIDER_SITE_OTHER): Payer: Medicare Other

## 2019-05-20 ENCOUNTER — Other Ambulatory Visit: Payer: Self-pay

## 2019-05-20 ENCOUNTER — Ambulatory Visit: Payer: Self-pay

## 2019-05-20 ENCOUNTER — Other Ambulatory Visit: Payer: Medicare Other

## 2019-05-20 VITALS — BP 121/74 | Wt 136.0 lb

## 2019-05-20 DIAGNOSIS — Z0000 Encounter for general adult medical examination without abnormal findings: Secondary | ICD-10-CM

## 2019-05-20 NOTE — Progress Notes (Signed)
PCP notes:  Health Maintenance: No gaps noted   Abnormal Screenings: none   Patient concerns: Patient has some ongoing stomach issues.   Nurse concerns: none   Next PCP appt.: 06/10/2019 @ 9:30 am

## 2019-05-20 NOTE — Progress Notes (Signed)
Subjective:   Jamie Dillon is a 69 y.o. female who presents for Medicare Annual (Subsequent) preventive examination.  Review of Systems: N/A   This visit is being conducted through telemedicine via telephone at the nurse health advisor's home address due to the COVID-19 pandemic. This patient has given me verbal consent via doximity to conduct this visit, patient states they are participating from their home address. Patient and myself are on the telephone call. There is no referral for this visit. Some vital signs may be absent or patient reported.    Patient identification: identified by name, DOB, and current address   Cardiac Risk Factors include: advanced age (>41men, >68 women);hypertension;Other (see comment), Risk factor comments: hypercholesterolemia     Objective:     Vitals: BP 121/74   Wt 136 lb (61.7 kg)   BMI 26.12 kg/m   Body mass index is 26.12 kg/m.  Advanced Directives 05/20/2019 05/17/2018 06/15/2016 02/23/2016 03/12/2015 03/05/2015 02/27/2015  Does Patient Have a Medical Advance Directive? Yes Yes Yes Yes Yes Yes Yes  Type of Paramedic of [place];Living will Healthcare Power of [place];Living will - Living will Living will [place];Living will  Does patient want to make changes to medical advance directive? - - - - No - Patient declined - -  Copy of [place] in Chart? No - copy requested No - copy requested No - copy requested - No - copy requested - No - copy requested  Would patient like information on creating a medical advance directive? - - - - - - -    Tobacco Social History   Tobacco Use  Smoking Status Former Smoker  . Packs/day: 1.00  . Types: Cigarettes  . Quit date: 06/25/2010  . Years since quitting: 8.9  Smokeless Tobacco Never Used     Counseling given: Not Answered   Clinical Intake:  Pre-visit preparation completed: Yes  Pain : No/denies  pain     Nutritional Risks: None Diabetes: No  How often do you need to have someone help you when you read instructions, pamphlets, or other written materials from your doctor or pharmacy?: 1 - Never What is the last grade level you completed in school?: 12th  Interpreter Needed?: No  Information entered by :: CJohnson, LPN  Past Medical History:  Diagnosis Date  . Allergic rhinitis   . Allergy   . Anxiety   . Arthritis    back, right hand, ? knees   . Breast cancer of lower-outer quadrant of right female breast ([place]) 06/19/2014  . Cataract 03/2018   bilateral eyes  . Disorder of bone and cartilage, unspecified   . Family history of malignant neoplasm of breast   . Family history of other kidney diseases   . GERD (gastroesophageal reflux disease)   . History of chemotherapy 2016  . History of radiation therapy 2016  . HLD (hyperlipidemia)   . HTN (hypertension)   . PONV (postoperative nausea and vomiting)   . Tobacco abuse    Past Surgical History:  Procedure Laterality Date  . ABDOMINAL HYSTERECTOMY    . APPENDECTOMY    . BREAST LUMPECTOMY WITH NEEDLE LOCALIZATION AND AXILLARY SENTINEL LYMPH NODE BX Right 07/21/2014   Procedure: BREAST LUMPECTOMY WITH NEEDLE LOCALIZATION AND AXILLARY SENTINEL LYMPH NODE BIOPSY;  Surgeon: Rolm Bookbinder, MD;  Location: [facility];  Service: General;  Laterality: Right;  . COLONOSCOPY    . DILATION AND CURETTAGE OF UTERUS    .  PORT-A-CATH REMOVAL N/A 03/12/2015   Procedure: REMOVAL PORT-A-CATH;  Surgeon: Rolm Bookbinder, MD;  Location: [facility];  Service: General;  Laterality: N/A;  . PORTACATH PLACEMENT Right 09/10/2014   Procedure: INSERTION PORT-A-CATH;  Surgeon: Rolm Bookbinder, MD;  Location: [facility];  Service: General;  Laterality: Right;  . TOTAL VAGINAL HYSTERECTOMY  1996   endometriosis   Family History  Problem Relation Age of Onset  . Hypertension Mother   . Kidney disease Father   .  Breast cancer Sister 54  . Diabetes Paternal Aunt   . Colon cancer Neg Hx   . Colon polyps Neg Hx   . Esophageal cancer Neg Hx   . Stomach cancer Neg Hx   . Rectal cancer Neg Hx    Social History   Socioeconomic History  . Marital status: Married    Spouse name: Not on file  . Number of children: 2  . Years of education: Not on file  . Highest education level: Not on file  Occupational History  . Not on file  Tobacco Use  . Smoking status: Former Smoker    Packs/day: 1.00    Types: Cigarettes    Quit date: 06/25/2010    Years since quitting: 8.9  . Smokeless tobacco: Never Used  Substance and Sexual Activity  . Alcohol use: Yes    Alcohol/week: 0.0 standard drinks    Comment: rarely wine/ 2x per year  . Drug use: No  . Sexual activity: Not on file  Other Topics Concern  . Not on file  Social History Narrative   Gym and walking for exercise      Cares for elderly mother and sister      married   Social Determinants of Health   Financial Resource Strain: Low Risk   . Difficulty of Paying Living Expenses: Not hard at all  Food Insecurity: No Food Insecurity  . Worried About Charity fundraiser in the Last Year: Never true  . Ran Out of Food in the Last Year: Never true  Transportation Needs: No Transportation Needs  . Lack of Transportation (Medical): No  . Lack of Transportation (Non-Medical): No  Physical Activity: Inactive  . Days of Exercise per Week: 0 days  . Minutes of Exercise per Session: 0 min  Stress: No Stress Concern Present  . Feeling of Stress : Not at all  Social Connections:   . Frequency of Communication with Friends and Family: Not on file  . Frequency of Social Gatherings with Friends and Family: Not on file  . Attends Religious Services: Not on file  . Active Member of Clubs or Organizations: Not on file  . Attends Archivist Meetings: Not on file  . Marital Status: Not on file    Outpatient Encounter Medications as of  05/20/2019  Medication Sig  . ALPRAZolam (XANAX) 0.5 MG tablet TAKE 1 TABLET BY MOUTH EVERY DAY AS NEEDED  . aspirin 81 MG chewable tablet Chew 81 mg by mouth daily.  Marland Kitchen atorvastatin (LIPITOR) 10 MG tablet TAKE 1 TABLET BY MOUTH EVERY DAY  . b complex vitamins tablet Take 1 tablet by mouth daily.   . Calcium Carbonate-Vitamin D (CALCIUM-VITAMIN D) 500-200 MG-UNIT per tablet Take 3 tablets by mouth every morning.    . diphenhydrAMINE (BENADRYL) 25 MG tablet Take 25 mg by mouth daily as needed for allergies.   Mariane Baumgarten Calcium (STOOL SOFTENER PO) Take 2 capsules by mouth daily.  . Glucosamine-Chondroit-Vit C-Mn (  GLUCOSAMINE 1500 COMPLEX) CAPS Take by mouth.  Marland Kitchen lisinopril-hydrochlorothiazide (PRINZIDE,ZESTORETIC) 20-25 MG tablet Take 1 tablet by mouth daily.  . metoprolol succinate (TOPROL-XL) 50 MG 24 hr tablet TAKE 1 TABLET EVERY DAY WITH OR IMMEDIATELY FOLLOWING A MEAL  . Multiple Vitamin (MULTIVITAMIN) tablet Take 1 tablet by mouth daily.    Marland Kitchen omeprazole (PRILOSEC) 20 MG capsule Take 1 capsule (20 mg total) by mouth 2 (two) times daily before a meal.  . tamoxifen (NOLVADEX) 20 MG tablet Take 1 tablet (20 mg total) by mouth daily.   No facility-administered encounter medications on file as of 05/20/2019.    Activities of Daily Living In your present state of health, do you have any difficulty performing the following activities: 05/20/2019  Hearing? N  Vision? N  Difficulty concentrating or making decisions? N  Walking or climbing stairs? N  Dressing or bathing? N  Doing errands, shopping? N  Preparing Food and eating ? N  Using the Toilet? N  In the past six months, have you accidently leaked urine? N  Do you have problems with loss of bowel control? N  Managing your Medications? N  Managing your Finances? N  Housekeeping or managing your Housekeeping? N  Some recent data might be hidden    Patient Care Team: Tower, Wynelle Fanny, MD as PCP - General Rolm Bookbinder, MD as Consulting  Physician (General Surgery) Magrinat, Virgie Dad, MD as Consulting Physician (Oncology) Eppie Gibson, MD as Attending Physician (Radiation Oncology) Rockwell Germany, RN as Registered Nurse Mauro Kaufmann, RN as Registered Nurse Holley Bouche, NP (Inactive) as Nurse Practitioner (Nurse Practitioner) Sylvan Cheese, NP as Nurse Practitioner (Nurse Practitioner)    Assessment:   This is a routine wellness examination for Stepheni.  Exercise Activities and Dietary recommendations Current Exercise Habits: The patient does not participate in regular exercise at present, Exercise limited by: None identified  Goals    . Increase physical activity     When tolerated and weather permitting, I will resume walking 30 min 5-7 days per week.    . Patient Stated     05/20/2019, I will try to increase my exercise on a daily basis.        Fall Risk Fall Risk  05/20/2019 05/17/2018 06/15/2016 05/03/2016 04/28/2015  Falls in the past year? 0 0 No No No  Number falls in past yr: 0 - - - -  Injury with Fall? 0 - - - -  Risk for fall due to : Medication side effect - - - -  Follow up Falls evaluation completed;Falls prevention discussed - - - -   Is the patient's home free of loose throw rugs in walkways, pet beds, electrical cords, etc?   yes      Grab bars in the bathroom? no      Handrails on the stairs?   no      Adequate lighting?   yes  Timed Get Up and Go performed: N/A  Depression Screen PHQ 2/9 Scores 05/20/2019 05/17/2018 06/15/2016 05/03/2016  PHQ - 2 Score 0 0 0 0  PHQ- 9 Score 0 0 - -     Cognitive Function MMSE - Mini Mental State Exam 05/20/2019 05/17/2018 06/15/2016  Orientation to time 5 5 5   Orientation to Place 5 5 5   Registration 3 3 3   Attention/ Calculation 5 0 0  Recall 3 3 3   Language- name 2 objects - 0 0  Language- repeat 1 1 1   Language- follow  3 step command - 3 3  Language- read & follow direction - 0 0  Write a sentence - 0 0  Copy design - 0 0  Total  score - 20 20  Mini Cog  Mini-Cog screen was completed. Maximum score is 22. A value of 0 denotes this part of the MMSE was not completed or the patient failed this part of the Mini-Cog screening.       Immunization History  Administered Date(s) Administered  . Fluad Quad(high Dose 65+) 01/23/2019  . Influenza Split 03/04/2011  . Influenza Whole 02/13/2008, 02/16/2009, 03/01/2010  . Influenza, High Dose Seasonal PF 03/12/2015, 01/26/2016, 02/10/2017, 01/26/2018  . Influenza-Unspecified 02/14/2014  . Pneumococcal Conjugate-13 04/28/2015  . Pneumococcal Polysaccharide-23 02/13/2008, 04/25/2014  . Td 10/04/2001  . Tdap 03/04/2011    Qualifies for Shingles Vaccine? Yes   Screening Tests Health Maintenance  Topic Date Due  . MAMMOGRAM  07/31/2019  . TETANUS/TDAP  03/03/2021  . COLONOSCOPY  04/21/2029  . INFLUENZA VACCINE  Completed  . DEXA SCAN  Completed  . Hepatitis C Screening  Completed  . PNA vac Low Risk Adult  Completed    Cancer Screenings: Lung: Low Dose CT Chest recommended if Age 37-80 years, 30 pack-year currently smoking OR have quit w/in 15 years. Patient does not qualify. Breast:  Up to date on Mammogram? Yes, completed 07/31/2018   Up to date of Bone Density/Dexa? Yes, completed 05/23/2017 Colorectal: completed 04/22/2019  Additional Screenings:  Hepatitis C Screening: 06/15/2016     Plan:   Patient will try to increase her exercise daily.    I have personally reviewed and noted the following in the patient's chart:   . Medical and social history . Use of alcohol, tobacco or illicit drugs  . Current medications and supplements . Functional ability and status . Nutritional status . Physical activity . Advanced directives . List of other physicians . Hospitalizations, surgeries, and ER visits in previous 12 months . Vitals . Screenings to include cognitive, depression, and falls . Referrals and appointments  In addition, I have reviewed and discussed  with patient certain preventive protocols, quality metrics, and best practice recommendations. A written personalized care plan for preventive services as well as general preventive health recommendations were provided to patient.     Andrez Grime, LPN  [id number]

## 2019-05-20 NOTE — Patient Instructions (Addendum)
Jamie Dillon , Thank you for taking time to come for your Medicare Wellness Visit. I appreciate your ongoing commitment to your health goals. Please review the following plan we discussed and let me know if I can assist you in the future.   Screening recommendations/referrals: Colonoscopy: Up to date, completed 04/22/2019 Mammogram: Up to date, completed 07/31/2018 Bone Density: Up to date, completed 05/23/2017 Recommended yearly ophthalmology/optometry visit for glaucoma screening and checkup Recommended yearly dental visit for hygiene and checkup  Vaccinations: Influenza vaccine: Up to date, completed 01/23/2019 Pneumococcal vaccine: Completed series Tdap vaccine: Up to date, completed 03/04/2011 Shingles vaccine: discussed    Advanced directives: Please bring a copy of your POA (Power of [place]) and/or Living Will to your next appointment.   Conditions/risks identified: hypertension, hypercholesterolemia  Next appointment: 06/10/2019 @ 9:30 am    Preventive Care 71 Years and Older, Female Preventive care refers to lifestyle choices and visits with your health care provider that can promote health and wellness. What does preventive care include?  A yearly physical exam. This is also called an annual well check.  Dental exams once or twice a year.  Routine eye exams. Ask your health care provider how often you should have your eyes checked.  Personal lifestyle choices, including:  Daily care of your teeth and gums.  Regular physical activity.  Eating a healthy diet.  Avoiding tobacco and drug use.  Limiting alcohol use.  Practicing safe sex.  Taking low-dose aspirin every day.  Taking vitamin and mineral supplements as recommended by your health care provider. What happens during an annual well check? The services and screenings done by your health care provider during your annual well check will depend on your age, overall health, lifestyle risk factors, and family  history of disease. Counseling  Your health care provider may ask you questions about your:  Alcohol use.  Tobacco use.  Drug use.  Emotional well-being.  Home and relationship well-being.  Sexual activity.  Eating habits.  History of falls.  Memory and ability to understand (cognition).  Work and work Statistician.  Reproductive health. Screening  You may have the following tests or measurements:  Height, weight, and BMI.  Blood pressure.  Lipid and cholesterol levels. These may be checked every 5 years, or more frequently if you are over 25 years old.  Skin check.  Lung cancer screening. You may have this screening every year starting at age 62 if you have a 30-pack-year history of smoking and currently smoke or have quit within the past 15 years.  Fecal occult blood test (FOBT) of the stool. You may have this test every year starting at age 100.  Flexible sigmoidoscopy or colonoscopy. You may have a sigmoidoscopy every 5 years or a colonoscopy every 10 years starting at age 80.  Hepatitis C blood test.  Hepatitis B blood test.  Sexually transmitted disease (STD) testing.  Diabetes screening. This is done by checking your blood sugar (glucose) after you have not eaten for a while (fasting). You may have this done every 1-3 years.  Bone density scan. This is done to screen for osteoporosis. You may have this done starting at age 42.  Mammogram. This may be done every 1-2 years. Talk to your health care provider about how often you should have regular mammograms. Talk with your health care provider about your test results, treatment options, and if necessary, the need for more tests. Vaccines  Your health care provider may recommend certain vaccines, such as:  Influenza vaccine. This is recommended every year.  Tetanus, diphtheria, and acellular pertussis (Tdap, Td) vaccine. You may need a Td booster every 10 years.  Zoster vaccine. You may need this after  age 76.  Pneumococcal 13-valent conjugate (PCV13) vaccine. One dose is recommended after age 1.  Pneumococcal polysaccharide (PPSV23) vaccine. One dose is recommended after age 64. Talk to your health care provider about which screenings and vaccines you need and how often you need them. This information is not intended to replace advice given to you by your health care provider. Make sure you discuss any questions you have with your health care provider. Document Released: 05/29/2015 Document Revised: 01/20/2016 Document Reviewed: 03/03/2015 Elsevier Interactive Patient Education  2017 [place] Prevention in the Home Falls can cause injuries. They can happen to people of all ages. There are many things you can do to make your home safe and to help prevent falls. What can I do on the outside of my home?  Regularly fix the edges of walkways and driveways and fix any cracks.  Remove anything that might make you trip as you walk through a door, such as a raised step or threshold.  Trim any bushes or trees on the path to your home.  Use bright outdoor lighting.  Clear any walking paths of anything that might make someone trip, such as rocks or tools.  Regularly check to see if handrails are loose or broken. Make sure that both sides of any steps have handrails.  Any raised decks and porches should have guardrails on the edges.  Have any leaves, snow, or ice cleared regularly.  Use sand or salt on walking paths during winter.  Clean up any spills in your garage right away. This includes oil or grease spills. What can I do in the bathroom?  Use night lights.  Install grab bars by the toilet and in the tub and shower. Do not use towel bars as grab bars.  Use non-skid mats or decals in the tub or shower.  If you need to sit down in the shower, use a plastic, non-slip stool.  Keep the floor dry. Clean up any water that spills on the floor as soon as it  happens.  Remove soap buildup in the tub or shower regularly.  Attach bath mats securely with double-sided non-slip rug tape.  Do not have throw rugs and other things on the floor that can make you trip. What can I do in the bedroom?  Use night lights.  Make sure that you have a light by your bed that is easy to reach.  Do not use any sheets or blankets that are too big for your bed. They should not hang down onto the floor.  Have a firm chair that has side arms. You can use this for support while you get dressed.  Do not have throw rugs and other things on the floor that can make you trip. What can I do in the kitchen?  Clean up any spills right away.  Avoid walking on wet floors.  Keep items that you use a lot in easy-to-reach places.  If you need to reach something above you, use a strong step stool that has a grab bar.  Keep electrical cords out of the way.  Do not use floor polish or wax that makes floors slippery. If you must use wax, use non-skid floor wax.  Do not have throw rugs and other things on the floor that  can make you trip. What can I do with my stairs?  Do not leave any items on the stairs.  Make sure that there are handrails on both sides of the stairs and use them. Fix handrails that are broken or loose. Make sure that handrails are as long as the stairways.  Check any carpeting to make sure that it is firmly attached to the stairs. Fix any carpet that is loose or worn.  Avoid having throw rugs at the top or bottom of the stairs. If you do have throw rugs, attach them to the floor with carpet tape.  Make sure that you have a light switch at the top of the stairs and the bottom of the stairs. If you do not have them, ask someone to add them for you. What else can I do to help prevent falls?  Wear shoes that:  Do not have high heels.  Have rubber bottoms.  Are comfortable and fit you well.  Are closed at the toe. Do not wear sandals.  If you  use a stepladder:  Make sure that it is fully opened. Do not climb a closed stepladder.  Make sure that both sides of the stepladder are locked into place.  Ask someone to hold it for you, if possible.  Clearly mark and make sure that you can see:  Any grab bars or handrails.  First and last steps.  Where the edge of each step is.  Use tools that help you move around (mobility aids) if they are needed. These include:  Canes.  Walkers.  Scooters.  Crutches.  Turn on the lights when you go into a dark area. Replace any light bulbs as soon as they burn out.  Set up your furniture so you have a clear path. Avoid moving your furniture around.  If any of your floors are uneven, fix them.  If there are any pets around you, be aware of where they are.  Review your medicines with your doctor. Some medicines can make you feel dizzy. This can increase your chance of falling. Ask your doctor what other things that you can do to help prevent falls. This information is not intended to replace advice given to you by your health care provider. Make sure you discuss any questions you have with your health care provider. Document Released: 02/26/2009 Document Revised: 10/08/2015 Document Reviewed: 06/06/2014 Elsevier Interactive Patient Education  2017 [employer].

## 2019-05-27 ENCOUNTER — Other Ambulatory Visit: Payer: Self-pay | Admitting: *Deleted

## 2019-05-27 ENCOUNTER — Encounter: Payer: Self-pay | Admitting: Family Medicine

## 2019-05-27 MED ORDER — METOPROLOL SUCCINATE ER 50 MG PO TB24: ORAL_TABLET | ORAL | 0 refills | Status: DC

## 2019-05-27 NOTE — Telephone Encounter (Signed)
Refilled once until CPE

## 2019-06-03 ENCOUNTER — Other Ambulatory Visit (INDEPENDENT_AMBULATORY_CARE_PROVIDER_SITE_OTHER): Payer: Medicare Other

## 2019-06-03 DIAGNOSIS — E7800 Pure hypercholesterolemia, unspecified: Secondary | ICD-10-CM | POA: Diagnosis not present

## 2019-06-03 DIAGNOSIS — I10 Essential (primary) hypertension: Secondary | ICD-10-CM | POA: Diagnosis not present

## 2019-06-03 LAB — COMPREHENSIVE METABOLIC PANEL
ALT: 30 U/L (ref 0–35)
AST: 34 U/L (ref 0–37)
Albumin: 4.1 g/dL (ref 3.5–5.2)
Alkaline Phosphatase: 56 U/L (ref 39–117)
BUN: 21 mg/dL (ref 6–23)
CO2: 26 mEq/L (ref 19–32)
Calcium: 9.7 mg/dL (ref 8.4–10.5)
Chloride: 105 mEq/L (ref 96–112)
Creatinine, Ser: 0.89 mg/dL (ref 0.40–1.20)
GFR: 62.66 mL/min (ref >60.00)
Glucose, Bld: 89 mg/dL (ref 70–99)
Potassium: 3.8 mEq/L (ref 3.5–5.1)
Sodium: 140 mEq/L (ref 135–145)
Total Bilirubin: 0.5 mg/dL (ref 0.2–1.2)
Total Protein: 7.2 g/dL (ref 6.0–8.3)

## 2019-06-03 LAB — CBC WITH DIFFERENTIAL/PLATELET
Basophils Absolute: 0.2 10*3/uL — ABNORMAL HIGH (ref 0.0–0.1)
Basophils Relative: 2.2 % (ref 0.0–3.0)
Eosinophils Absolute: 0.4 10*3/uL (ref 0.0–0.7)
Eosinophils Relative: 5.2 % — ABNORMAL HIGH (ref 0.0–5.0)
HCT: 38.5 % (ref 36.0–46.0)
Hemoglobin: 13.1 g/dL (ref 12.0–15.0)
Lymphocytes Relative: 25.4 % (ref 12.0–46.0)
Lymphs Abs: 2.2 10*3/uL (ref 0.7–4.0)
MCHC: 34.1 g/dL (ref 30.0–36.0)
MCV: 96.7 fl (ref 78.0–100.0)
Monocytes Absolute: 0.9 10*3/uL (ref 0.1–1.0)
Monocytes Relative: 10.7 % (ref 3.0–12.0)
Neutro Abs: 4.8 10*3/uL (ref 1.4–7.7)
Neutrophils Relative %: 56.5 % (ref 43.0–77.0)
Platelets: 238 10*3/uL (ref 150.0–400.0)
RBC: 3.99 Mil/uL (ref 3.87–5.11)
RDW: 13.3 % (ref 11.5–15.5)
WBC: 8.5 10*3/uL (ref 4.0–10.5)

## 2019-06-03 LAB — LIPID PANEL
Cholesterol: 143 mg/dL (ref 0–200)
HDL: 41.8 mg/dL (ref >39.00)
LDL Cholesterol: 70 mg/dL (ref 0–99)
NonHDL: 100.8
Total CHOL/HDL Ratio: 3
Triglycerides: 153 mg/dL — ABNORMAL HIGH (ref 0.0–149.0)
VLDL: 30.6 mg/dL (ref 0.0–40.0)

## 2019-06-03 LAB — TSH: TSH: 2.81 u[IU]/mL (ref 0.35–4.50)

## 2019-06-07 ENCOUNTER — Ambulatory Visit: Payer: Medicare Other | Attending: Internal Medicine

## 2019-06-07 DIAGNOSIS — Z23 Encounter for immunization: Secondary | ICD-10-CM | POA: Insufficient documentation

## 2019-06-07 NOTE — Progress Notes (Signed)
   Covid-19 Vaccination Clinic  Name:  Jamie Dillon    MRN: [id number] DOB: 1951-02-17  06/07/2019  Jamie Dillon was observed post Covid-19 immunization for 15 minutes without incidence. She was provided with Vaccine Information Sheet and instruction to access the V-Safe system.   Jamie Dillon was instructed to call 911 with any severe reactions post vaccine: Marland Kitchen Difficulty breathing  . Swelling of your face and throat  . A fast heartbeat  . A bad rash all over your body  . Dizziness and weakness    Immunizations Administered    Name Date Dose VIS Date Route   Pfizer COVID-19 Vaccine 06/07/2019  3:37 PM 0.3 mL 04/26/2019 Intramuscular   Manufacturer: [place]   Lot: [id number]   [place]: [id number]

## 2019-06-10 ENCOUNTER — Encounter: Payer: Self-pay | Admitting: Family Medicine

## 2019-06-10 ENCOUNTER — Ambulatory Visit (INDEPENDENT_AMBULATORY_CARE_PROVIDER_SITE_OTHER): Payer: Medicare Other | Admitting: Family Medicine

## 2019-06-10 ENCOUNTER — Other Ambulatory Visit: Payer: Self-pay

## 2019-06-10 VITALS — BP 130/84 | HR 85 | Temp 97.4°F | Ht 60.5 in | Wt 139.3 lb

## 2019-06-10 DIAGNOSIS — Z853 Personal history of malignant neoplasm of breast: Secondary | ICD-10-CM | POA: Diagnosis not present

## 2019-06-10 DIAGNOSIS — Z0000 Encounter for general adult medical examination without abnormal findings: Secondary | ICD-10-CM | POA: Diagnosis not present

## 2019-06-10 DIAGNOSIS — I700 Atherosclerosis of aorta: Secondary | ICD-10-CM

## 2019-06-10 DIAGNOSIS — I10 Essential (primary) hypertension: Secondary | ICD-10-CM | POA: Diagnosis not present

## 2019-06-10 DIAGNOSIS — E7800 Pure hypercholesterolemia, unspecified: Secondary | ICD-10-CM

## 2019-06-10 DIAGNOSIS — K2900 Acute gastritis without bleeding: Secondary | ICD-10-CM

## 2019-06-10 MED ORDER — OMEPRAZOLE 20 MG PO CPDR: 20.0000 mg | DELAYED_RELEASE_CAPSULE | Freq: Two times a day (BID) | ORAL | 3 refills | Status: DC

## 2019-06-10 MED ORDER — METOPROLOL SUCCINATE ER 50 MG PO TB24: ORAL_TABLET | ORAL | 3 refills | Status: DC

## 2019-06-10 MED ORDER — LISINOPRIL-HYDROCHLOROTHIAZIDE 20-25 MG PO TABS: 1.0000 | ORAL_TABLET | Freq: Every day | ORAL | 3 refills | Status: DC

## 2019-06-10 MED ORDER — ATORVASTATIN CALCIUM 10 MG PO TABS: 10.0000 mg | ORAL_TABLET | Freq: Every day | ORAL | 3 refills | Status: DC

## 2019-06-10 NOTE — Assessment & Plan Note (Signed)
oncol f/u in October Continues tamoxifen  No clinical changes

## 2019-06-10 NOTE — Assessment & Plan Note (Signed)
Disc goals for lipids and reasons to control them Rev last labs with pt Rev low sat fat diet in detail Stable with atorvastatin and diet

## 2019-06-10 NOTE — Assessment & Plan Note (Signed)
Reviewed health habits including diet and exercise and skin cancer prevention Reviewed appropriate screening tests for age  Also reviewed health mt list, fam hx and immunization status , as well as social and family history   See HPI amw reviewed  Labs reviewed  Pt had first covid vaccine 1/22 Discussed shingrix vaccine  Continues oncology f/u for past breast cancer  Nl BMD dexa 1/19 with no falls or fractures  Enc pt to continue healthy diet and exercise

## 2019-06-10 NOTE — Assessment & Plan Note (Signed)
Good bp and lipid control  No symptoms  Continue to monitor

## 2019-06-10 NOTE — Patient Instructions (Addendum)
Take care of yourself   Keep eating healthy and exercising  Labs look stable   After your covid vaccines are done- consider the shingrix vaccine   If you are interested in the new shingles vaccine (Shingrix) - call your local pharmacy to check on coverage and availability  If affordable, get on a wait list at your pharmacy to get the vaccine.

## 2019-06-10 NOTE — Assessment & Plan Note (Signed)
Much improved with omeprazole 20 mg bid

## 2019-06-10 NOTE — Progress Notes (Signed)
Subjective:    Patient ID: Jamie Dillon, female    DOB: [birth year], 69 y.o.   MRN: [id number]  This visit occurred during the SARS-CoV-2 public health emergency.  Safety protocols were in place, including screening questions prior to the visit, additional usage of staff PPE, and extensive cleaning of exam room while observing appropriate contact time as indicated for disinfecting solutions.    HPI Here for health maintenance exam and to review chronic medical problems    Has been feeling good  Omeprazole helps her GI symptoms -now improved even more   Wt Readings from Last 3 Encounters:  06/10/19 139 lb 5 oz (63.2 kg)  05/20/19 136 lb (61.7 kg)  04/22/19 140 lb 6.4 oz (63.7 kg)  wt is stable  Trying to eat healthy  Getting on treadmill daily for 30 minutes or more  26.76 kg/m   Had amw on 1/4 No gaps identified    Mammogram 3/20 Personal h/o breast cancer in 2016 with chemo and radiation  Takes tamoxifen (has one year left)  Last saw oncology in October  Self breast exam - no lumps  Sister had breast cancer at 81 yo   Tdap 10/12  She had first covid vaccine 1/22  Zoster status -plans to get the shingrix  Had zostavax    Colonoscopy 12/20    dexa 1/19 -normal BMD Falls -none  Fractures -none Supplements -takes ca and D Exercise -treadmill  Will get back to the gym when it is safe   bp is stable today  No cp or palpitations or headaches or edema  No side effects to medicines  BP Readings from Last 3 Encounters:  06/10/19 130/84  05/20/19 121/74  04/22/19 (!) 103/53      Pulse Readings from Last 3 Encounters:  06/10/19 85  04/22/19 67  03/29/19 (!) 102     H/o aortic atherosclerosis -no symptoms  Hyperlipidemia Lab Results  Component Value Date   CHOL 143 06/03/2019   CHOL 144 05/17/2018   CHOL 128 05/05/2017   Lab Results  Component Value Date   HDL 41.80 06/03/2019   HDL 43.90 05/17/2018   HDL 39.70 05/05/2017   Lab Results    Component Value Date   LDLCALC 70 06/03/2019   LDLCALC 73 05/17/2018   LDLCALC 60 05/05/2017   Lab Results  Component Value Date   TRIG 153.0 (H) 06/03/2019   TRIG 135.0 05/17/2018   TRIG 140.0 05/05/2017   Lab Results  Component Value Date   CHOLHDL 3 06/03/2019   CHOLHDL 3 05/17/2018   CHOLHDL 3 05/05/2017   Lab Results  Component Value Date   LDLDIRECT 127.3 03/01/2011   Taking atorvastatin 10 mg daily  Good diet   Lab Results  Component Value Date   WBC 8.5 06/03/2019   HGB 13.1 06/03/2019   HCT 38.5 06/03/2019   MCV 96.7 06/03/2019   PLT 238.0 06/03/2019   Lab Results  Component Value Date   TSH 2.81 06/03/2019    Lab Results  Component Value Date   CREATININE 0.89 06/03/2019   BUN 21 06/03/2019   NA 140 06/03/2019   K 3.8 06/03/2019   CL 105 06/03/2019   CO2 26 06/03/2019   Lab Results  Component Value Date   ALT 30 06/03/2019   AST 34 06/03/2019   ALKPHOS 56 06/03/2019   BILITOT 0.5 06/03/2019    Patient Active Problem List   Diagnosis Date Noted  . Gastritis 09/19/2018  . Aortic atherosclerosis (  [place]) 06/22/2017  . Low back pain 06/21/2017  . Sinus tachycardia 09/21/2014  . Anxiety 07/08/2014  . History of breast cancer 06/19/2014  . Encounter for Medicare annual wellness exam 04/25/2014  . Estrogen deficiency 04/25/2014  . Stress reaction 01/03/2014  . Routine general medical examination at a health care facility 02/26/2011  . HYPERCHOLESTEROLEMIA, PURE 02/01/2007  . Essential hypertension 09/08/2006  . ALLERGIC RHINITIS 09/08/2006  . TOBACCO ABUSE, HX OF 09/08/2006   Past Medical History:  Diagnosis Date  . Allergic rhinitis   . Allergy   . Anxiety   . Arthritis    back, right hand, ? knees   . Breast cancer of lower-outer quadrant of right female breast ([place]) 06/19/2014  . Cataract 03/2018   bilateral eyes  . Disorder of bone and cartilage, unspecified   . Family history of malignant neoplasm of breast   . Family history of  other kidney diseases   . GERD (gastroesophageal reflux disease)   . History of chemotherapy 2016  . History of radiation therapy 2016  . HLD (hyperlipidemia)   . HTN (hypertension)   . PONV (postoperative nausea and vomiting)   . Tobacco abuse    Past Surgical History:  Procedure Laterality Date  . ABDOMINAL HYSTERECTOMY    . APPENDECTOMY    . BREAST LUMPECTOMY WITH NEEDLE LOCALIZATION AND AXILLARY SENTINEL LYMPH NODE BX Right 07/21/2014   Procedure: BREAST LUMPECTOMY WITH NEEDLE LOCALIZATION AND AXILLARY SENTINEL LYMPH NODE BIOPSY;  Surgeon: Rolm Bookbinder, MD;  Location: [facility];  Service: General;  Laterality: Right;  . COLONOSCOPY    . DILATION AND CURETTAGE OF UTERUS    . PORT-A-CATH REMOVAL N/A 03/12/2015   Procedure: REMOVAL PORT-A-CATH;  Surgeon: Rolm Bookbinder, MD;  Location: [facility];  Service: General;  Laterality: N/A;  . PORTACATH PLACEMENT Right 09/10/2014   Procedure: INSERTION PORT-A-CATH;  Surgeon: Rolm Bookbinder, MD;  Location: [facility];  Service: General;  Laterality: Right;  . TOTAL VAGINAL HYSTERECTOMY  1996   endometriosis   Social History   Tobacco Use  . Smoking status: Former Smoker    Packs/day: 1.00    Types: Cigarettes    Quit date: 06/25/2010    Years since quitting: 8.9  . Smokeless tobacco: Never Used  Substance Use Topics  . Alcohol use: Yes    Alcohol/week: 0.0 standard drinks    Comment: rarely wine/ 2x per year  . Drug use: No   Family History  Problem Relation Age of Onset  . Hypertension Mother   . Kidney disease Father   . Breast cancer Sister 61  . Diabetes Paternal Aunt   . Colon cancer Neg Hx   . Colon polyps Neg Hx   . Esophageal cancer Neg Hx   . Stomach cancer Neg Hx   . Rectal cancer Neg Hx    Allergies  Allergen Reactions  . Dust Mite Extract   . Pollen Extract    Current Outpatient Medications on File Prior to Visit  Medication Sig Dispense Refill  . ALPRAZolam (XANAX) 0.5 MG  tablet TAKE 1 TABLET BY MOUTH EVERY DAY AS NEEDED 30 tablet 0  . aspirin 81 MG chewable tablet Chew 81 mg by mouth daily.    Marland Kitchen b complex vitamins tablet Take 1 tablet by mouth daily.     . Calcium Carbonate-Vitamin D (CALCIUM-VITAMIN D) 500-200 MG-UNIT per tablet Take 3 tablets by mouth every morning.      . diphenhydrAMINE (BENADRYL) 25 MG tablet Take  25 mg by mouth daily as needed for allergies.     Mariane Baumgarten Calcium (STOOL SOFTENER PO) Take 2 capsules by mouth daily.    . Glucosamine-Chondroit-Vit C-Mn (GLUCOSAMINE 1500 COMPLEX) CAPS Take by mouth.    . Multiple Vitamin (MULTIVITAMIN) tablet Take 1 tablet by mouth daily.      . tamoxifen (NOLVADEX) 20 MG tablet Take 1 tablet (20 mg total) by mouth daily. 90 tablet 4   No current facility-administered medications on file prior to visit.    Review of Systems  Constitutional: Negative for activity change, appetite change, fatigue, fever and unexpected weight change.  HENT: Negative for congestion, ear pain, rhinorrhea, sinus pressure and sore throat.   Eyes: Negative for pain, redness and visual disturbance.  Respiratory: Negative for cough, shortness of breath and wheezing.   Cardiovascular: Negative for chest pain and palpitations.  Gastrointestinal: Negative for abdominal pain, blood in stool, constipation and diarrhea.  Endocrine: Negative for polydipsia and polyuria.  Genitourinary: Negative for dysuria, frequency and urgency.  Musculoskeletal: Negative for arthralgias, back pain and myalgias.  Skin: Negative for pallor and rash.  Allergic/Immunologic: Negative for environmental allergies.  Neurological: Negative for dizziness, syncope and headaches.  Hematological: Negative for adenopathy. Does not bruise/bleed easily.  Psychiatric/Behavioral: Negative for decreased concentration and dysphoric mood. The patient is not nervous/anxious.        Objective:   Physical Exam Constitutional:      General: She is not in acute  distress.    Appearance: Normal appearance. She is well-developed. She is not ill-appearing or diaphoretic.  HENT:     Head: Normocephalic and atraumatic.     Right Ear: Tympanic membrane, ear canal and external ear normal.     Left Ear: Tympanic membrane, ear canal and external ear normal.     Nose: Nose normal. No congestion.     Mouth/Throat:     Mouth: Mucous membranes are moist.     Pharynx: Oropharynx is clear. No posterior oropharyngeal erythema.  Eyes:     General: No scleral icterus.    Extraocular Movements: Extraocular movements intact.     Conjunctiva/sclera: Conjunctivae normal.     Pupils: Pupils are equal, round, and reactive to light.  Neck:     Thyroid: No thyromegaly.     Vascular: No carotid bruit or JVD.  Cardiovascular:     Rate and Rhythm: Normal rate and regular rhythm.     Pulses: Normal pulses.     Heart sounds: Normal heart sounds. No gallop.   Pulmonary:     Effort: Pulmonary effort is normal. No respiratory distress.     Breath sounds: Normal breath sounds. No wheezing.     Comments: Good air exch Chest:     Chest wall: No tenderness.  Abdominal:     General: Bowel sounds are normal. There is no distension or abdominal bruit.     Palpations: Abdomen is soft. There is no mass.     Tenderness: There is no abdominal tenderness.     Hernia: No hernia is present.  Genitourinary:    Comments: Breast exam: No mass, nodules, thickening, tenderness, bulging, retraction, inflamation, nipple discharge or skin changes noted.  No axillary or clavicular LA.     Surgical changes noted Musculoskeletal:        General: No tenderness. Normal range of motion.     Cervical back: Normal range of motion and neck supple. No rigidity. No muscular tenderness.     Right lower leg: No edema.  Left lower leg: No edema.  Lymphadenopathy:     Cervical: No cervical adenopathy.  Skin:    General: Skin is warm and dry.     Coloration: Skin is not pale.     Findings: No  erythema or rash.     Comments: Solar lentigines diffusely sks scattered  Neurological:     Mental Status: She is alert. Mental status is at baseline.     Cranial Nerves: No cranial nerve deficit.     Motor: No abnormal muscle tone.     Coordination: Coordination normal.     Gait: Gait normal.     Deep Tendon Reflexes: Reflexes are normal and symmetric. Reflexes normal.  Psychiatric:        Mood and Affect: Mood normal.        Cognition and Memory: Cognition and memory normal.           Assessment & Plan:   Problem List Items Addressed This Visit      Cardiovascular and Mediastinum   Essential hypertension    bp in fair control at this time  BP Readings from Last 1 Encounters:  06/10/19 130/84   No changes needed Most recent labs reviewed  Disc lifstyle change with low sodium diet and exercise        Relevant Medications   lisinopril-hydrochlorothiazide (ZESTORETIC) 20-25 MG tablet   metoprolol succinate (TOPROL-XL) 50 MG 24 hr tablet   atorvastatin (LIPITOR) 10 MG tablet   Aortic atherosclerosis ([facility])    Good bp and lipid control  No symptoms  Continue to monitor      Relevant Medications   lisinopril-hydrochlorothiazide (ZESTORETIC) 20-25 MG tablet   metoprolol succinate (TOPROL-XL) 50 MG 24 hr tablet   atorvastatin (LIPITOR) 10 MG tablet     Digestive   Gastritis    Much improved with omeprazole 20 mg bid          Other   HYPERCHOLESTEROLEMIA, PURE    Disc goals for lipids and reasons to control them Rev last labs with pt Rev low sat fat diet in detail Stable with atorvastatin and diet       Relevant Medications   lisinopril-hydrochlorothiazide (ZESTORETIC) 20-25 MG tablet   metoprolol succinate (TOPROL-XL) 50 MG 24 hr tablet   atorvastatin (LIPITOR) 10 MG tablet   Routine general medical examination at a health care facility - Primary    Reviewed health habits including diet and exercise and skin cancer prevention Reviewed appropriate  screening tests for age  Also reviewed health mt list, fam hx and immunization status , as well as social and family history   See HPI amw reviewed  Labs reviewed  Pt had first covid vaccine 1/22 Discussed shingrix vaccine  Continues oncology f/u for past breast cancer  Nl BMD dexa 1/19 with no falls or fractures  Enc pt to continue healthy diet and exercise        History of breast cancer    oncol f/u in October Continues tamoxifen  No clinical changes

## 2019-06-10 NOTE — Assessment & Plan Note (Signed)
bp in fair control at this time  BP Readings from Last 1 Encounters:  06/10/19 130/84   No changes needed Most recent labs reviewed  Disc lifstyle change with low sodium diet and exercise

## 2019-06-25 ENCOUNTER — Other Ambulatory Visit: Payer: Self-pay

## 2019-06-25 ENCOUNTER — Encounter: Payer: Self-pay | Admitting: Family Medicine

## 2019-06-25 ENCOUNTER — Ambulatory Visit (INDEPENDENT_AMBULATORY_CARE_PROVIDER_SITE_OTHER): Payer: Medicare Other | Admitting: Family Medicine

## 2019-06-25 VITALS — BP 134/86 | HR 82 | Temp 97.4°F | Ht 60.5 in | Wt 138.4 lb

## 2019-06-25 DIAGNOSIS — R300 Dysuria: Secondary | ICD-10-CM

## 2019-06-25 DIAGNOSIS — N3000 Acute cystitis without hematuria: Secondary | ICD-10-CM | POA: Diagnosis not present

## 2019-06-25 LAB — POC URINALSYSI DIPSTICK (AUTOMATED)
Bilirubin, UA: NEGATIVE
Blood, UA: 50
Glucose, UA: NEGATIVE
Ketones, UA: NEGATIVE
Nitrite, UA: NEGATIVE
Protein, UA: NEGATIVE
Spec Grav, UA: 1.015 (ref 1.010–1.025)
Urobilinogen, UA: 0.2 E.U./dL
pH, UA: 6 (ref 5.0–8.0)

## 2019-06-25 MED ORDER — SULFAMETHOXAZOLE-TRIMETHOPRIM 800-160 MG PO TABS: 1.0000 | ORAL_TABLET | Freq: Two times a day (BID) | ORAL | 0 refills | Status: DC

## 2019-06-25 NOTE — Assessment & Plan Note (Signed)
Not complicated and not recurrent Enc water intake tx with bactrim DS cx pending-will touch base when it returns Handout given inst to call if symptoms worsen

## 2019-06-25 NOTE — Patient Instructions (Addendum)
I think you have a uti  Drink lots of water  Take the bactrim DS as directed with food  If any problems let me know  If suddenly worse let me know   We will contact you when urine culture returns    Urinary Tract Infection, Adult  A urinary tract infection (UTI) is an infection of any part of the urinary tract. The urinary tract includes the kidneys, ureters, bladder, and urethra. These organs make, store, and get rid of urine in the body. Your health care provider may use other names to describe the infection. An upper UTI affects the ureters and kidneys (pyelonephritis). A lower UTI affects the bladder (cystitis) and urethra (urethritis). What are the causes? Most urinary tract infections are caused by bacteria in your genital area, around the entrance to your urinary tract (urethra). These bacteria grow and cause inflammation of your urinary tract. What increases the risk? You are more likely to develop this condition if:  You have a urinary catheter that stays in place (indwelling).  You are not able to control when you urinate or have a bowel movement (you have incontinence).  You are female and you: ? Use a spermicide or diaphragm for birth control. ? Have low estrogen levels. ? Are pregnant.  You have certain genes that increase your risk (genetics).  You are sexually active.  You take antibiotic medicines.  You have a condition that causes your flow of urine to slow down, such as: ? An enlarged prostate, if you are female. ? Blockage in your urethra (stricture). ? A kidney stone. ? A nerve condition that affects your bladder control (neurogenic bladder). ? Not getting enough to drink, or not urinating often.  You have certain medical conditions, such as: ? Diabetes. ? A weak disease-fighting system (immunesystem). ? Sickle cell disease. ? Gout. ? Spinal cord injury. What are the signs or symptoms? Symptoms of this condition include:  Needing to urinate right  away (urgently).  Frequent urination or passing small amounts of urine frequently.  Pain or burning with urination.  Blood in the urine.  Urine that smells bad or unusual.  Trouble urinating.  Cloudy urine.  Vaginal discharge, if you are female.  Pain in the abdomen or the lower back. You may also have:  Vomiting or a decreased appetite.  Confusion.  Irritability or tiredness.  A fever.  Diarrhea. The first symptom in older adults may be confusion. In some cases, they may not have any symptoms until the infection has worsened. How is this diagnosed? This condition is diagnosed based on your medical history and a physical exam. You may also have other tests, including:  Urine tests.  Blood tests.  Tests for sexually transmitted infections (STIs). If you have had more than one UTI, a cystoscopy or imaging studies may be done to determine the cause of the infections. How is this treated? Treatment for this condition includes:  Antibiotic medicine.  Over-the-counter medicines to treat discomfort.  Drinking enough water to stay hydrated. If you have frequent infections or have other conditions such as a kidney stone, you may need to see a health care provider who specializes in the urinary tract (urologist). In rare cases, urinary tract infections can cause sepsis. Sepsis is a life-threatening condition that occurs when the body responds to an infection. Sepsis is treated in the hospital with IV antibiotics, fluids, and other medicines. Follow these instructions at home:  Medicines  Take over-the-counter and prescription medicines only as told  by your health care provider.  If you were prescribed an antibiotic medicine, take it as told by your health care provider. Do not stop using the antibiotic even if you start to feel better. General instructions  Make sure you: ? Empty your bladder often and completely. Do not hold urine for long periods of time. ? Empty  your bladder after sex. ? Wipe from front to back after a bowel movement if you are female. Use each tissue one time when you wipe.  Drink enough fluid to keep your urine pale yellow.  Keep all follow-up visits as told by your health care provider. This is important. Contact a health care provider if:  Your symptoms do not get better after 1-2 days.  Your symptoms go away and then return. Get help right away if you have:  Severe pain in your back or your lower abdomen.  A fever.  Nausea or vomiting. Summary  A urinary tract infection (UTI) is an infection of any part of the urinary tract, which includes the kidneys, ureters, bladder, and urethra.  Most urinary tract infections are caused by bacteria in your genital area, around the entrance to your urinary tract (urethra).  Treatment for this condition often includes antibiotic medicines.  If you were prescribed an antibiotic medicine, take it as told by your health care provider. Do not stop using the antibiotic even if you start to feel better.  Keep all follow-up visits as told by your health care provider. This is important. This information is not intended to replace advice given to you by your health care provider. Make sure you discuss any questions you have with your health care provider. Document Revised: 04/19/2018 Document Reviewed: 11/09/2017 Elsevier Patient Education  2020 [employer].

## 2019-06-25 NOTE — Progress Notes (Signed)
Subjective:    Patient ID: Jamie Dillon, female    DOB: 10-23-1950, 69 y.o.   MRN: [id number]  This visit occurred during the SARS-CoV-2 public health emergency.  Safety protocols were in place, including screening questions prior to the visit, additional usage of staff PPE, and extensive cleaning of exam room while observing appropriate contact time as indicated for disinfecting solutions.    HPI Pt presents for urinary symptoms   Wt Readings from Last 3 Encounters:  06/25/19 138 lb 6 oz (62.8 kg)  06/10/19 139 lb 5 oz (63.2 kg)  05/20/19 136 lb (61.7 kg)   26.58 kg/m  Pt c/o burning to urinate Full feeling bladder  Frequency and urgency  Had a hard time sleeping  2 d of symptoms   No visible blood  No fever or nausea   She does not get utis often Does not drink enough water-making the effort now  No flank pain    UA:- leuk and rbc Results for orders placed or performed in visit on 06/25/19  POCT Urinalysis Dipstick (Automated)  Result Value Ref Range   Color, UA Yellow    Clarity, UA Hazy    Glucose, UA Negative Negative   Bilirubin, UA Negaitve    Ketones, UA Negative    Spec Grav, UA 1.015 1.010 - 1.025   Blood, UA 50 Ery/uL    pH, UA 6.0 5.0 - 8.0   Protein, UA Negative Negative   Urobilinogen, UA 0.2 0.2 or 1.0 E.U./dL   Nitrite, UA Negative    Leukocytes, UA Small (1+) (A) Negative     Patient Active Problem List   Diagnosis Date Noted  . Acute cystitis 06/25/2019  . Gastritis 09/19/2018  . Aortic atherosclerosis ([place]) 06/22/2017  . Sinus tachycardia 09/21/2014  . Anxiety 07/08/2014  . History of breast cancer 06/19/2014  . Encounter for Medicare annual wellness exam 04/25/2014  . Estrogen deficiency 04/25/2014  . Stress reaction 01/03/2014  . Routine general medical examination at a health care facility 02/26/2011  . HYPERCHOLESTEROLEMIA, PURE 02/01/2007  . Essential hypertension 09/08/2006  . ALLERGIC RHINITIS 09/08/2006  . TOBACCO ABUSE, HX  OF 09/08/2006   Past Medical History:  Diagnosis Date  . Allergic rhinitis   . Allergy   . Anxiety   . Arthritis    back, right hand, ? knees   . Breast cancer of lower-outer quadrant of right female breast ([place]) 06/19/2014  . Cataract 03/2018   bilateral eyes  . Disorder of bone and cartilage, unspecified   . Family history of malignant neoplasm of breast   . Family history of other kidney diseases   . GERD (gastroesophageal reflux disease)   . History of chemotherapy 2016  . History of radiation therapy 2016  . HLD (hyperlipidemia)   . HTN (hypertension)   . PONV (postoperative nausea and vomiting)   . Tobacco abuse    Past Surgical History:  Procedure Laterality Date  . ABDOMINAL HYSTERECTOMY    . APPENDECTOMY    . BREAST LUMPECTOMY WITH NEEDLE LOCALIZATION AND AXILLARY SENTINEL LYMPH NODE BX Right 07/21/2014   Procedure: BREAST LUMPECTOMY WITH NEEDLE LOCALIZATION AND AXILLARY SENTINEL LYMPH NODE BIOPSY;  Surgeon: Rolm Bookbinder, MD;  Location: [facility];  Service: General;  Laterality: Right;  . COLONOSCOPY    . DILATION AND CURETTAGE OF UTERUS    . PORT-A-CATH REMOVAL N/A 03/12/2015   Procedure: REMOVAL PORT-A-CATH;  Surgeon: Rolm Bookbinder, MD;  Location: [facility];  Service: General;  Laterality:  N/A;  . PORTACATH PLACEMENT Right 09/10/2014   Procedure: INSERTION PORT-A-CATH;  Surgeon: Rolm Bookbinder, MD;  Location: [facility];  Service: General;  Laterality: Right;  . TOTAL VAGINAL HYSTERECTOMY  1996   endometriosis   Social History   Tobacco Use  . Smoking status: Former Smoker    Packs/day: 1.00    Types: Cigarettes    Quit date: 06/25/2010    Years since quitting: 9.0  . Smokeless tobacco: Never Used  Substance Use Topics  . Alcohol use: Yes    Alcohol/week: 0.0 standard drinks    Comment: rarely wine/ 2x per year  . Drug use: No   Family History  Problem Relation Age of Onset  . Hypertension Mother   . Kidney disease  Father   . Breast cancer Sister 10  . Diabetes Paternal Aunt   . Colon cancer Neg Hx   . Colon polyps Neg Hx   . Esophageal cancer Neg Hx   . Stomach cancer Neg Hx   . Rectal cancer Neg Hx    Allergies  Allergen Reactions  . Dust Mite Extract   . Pollen Extract    Current Outpatient Medications on File Prior to Visit  Medication Sig Dispense Refill  . ALPRAZolam (XANAX) 0.5 MG tablet TAKE 1 TABLET BY MOUTH EVERY DAY AS NEEDED 30 tablet 0  . aspirin 81 MG chewable tablet Chew 81 mg by mouth daily.    Marland Kitchen atorvastatin (LIPITOR) 10 MG tablet Take 1 tablet (10 mg total) by mouth daily. 90 tablet 3  . b complex vitamins tablet Take 1 tablet by mouth daily.     . Calcium Carbonate-Vitamin D (CALCIUM-VITAMIN D) 500-200 MG-UNIT per tablet Take 3 tablets by mouth every morning.      . diphenhydrAMINE (BENADRYL) 25 MG tablet Take 25 mg by mouth daily as needed for allergies.     Mariane Baumgarten Calcium (STOOL SOFTENER PO) Take 2 capsules by mouth daily.    . Glucosamine-Chondroit-Vit C-Mn (GLUCOSAMINE 1500 COMPLEX) CAPS Take by mouth.    Marland Kitchen lisinopril-hydrochlorothiazide (ZESTORETIC) 20-25 MG tablet Take 1 tablet by mouth daily. 90 tablet 3  . metoprolol succinate (TOPROL-XL) 50 MG 24 hr tablet TAKE 1 TABLET EVERY DAY WITH OR IMMEDIATELY FOLLOWING A MEAL 90 tablet 3  . Multiple Vitamin (MULTIVITAMIN) tablet Take 1 tablet by mouth daily.      Marland Kitchen omeprazole (PRILOSEC) 20 MG capsule Take 1 capsule (20 mg total) by mouth 2 (two) times daily before a meal. 180 capsule 3  . tamoxifen (NOLVADEX) 20 MG tablet Take 1 tablet (20 mg total) by mouth daily. 90 tablet 4   No current facility-administered medications on file prior to visit.    Review of Systems  Constitutional: Negative for activity change, appetite change, fatigue, fever and unexpected weight change.  HENT: Negative for congestion, ear pain, rhinorrhea, sinus pressure and sore throat.   Eyes: Negative for pain, redness and visual disturbance.    Respiratory: Negative for cough, shortness of breath and wheezing.   Cardiovascular: Negative for chest pain and palpitations.  Gastrointestinal: Negative for abdominal pain, blood in stool, constipation and diarrhea.  Endocrine: Negative for polydipsia and polyuria.  Genitourinary: Positive for dysuria, frequency and urgency. Negative for flank pain and hematuria.  Musculoskeletal: Negative for arthralgias, back pain and myalgias.  Skin: Negative for pallor and rash.  Allergic/Immunologic: Negative for environmental allergies.  Neurological: Negative for dizziness, syncope and headaches.  Hematological: Negative for adenopathy. Does not bruise/bleed easily.  Psychiatric/Behavioral:  Negative for decreased concentration and dysphoric mood. The patient is not nervous/anxious.        Objective:   Physical Exam Constitutional:      General: She is not in acute distress.    Appearance: Normal appearance. She is well-developed and normal weight. She is not ill-appearing.  HENT:     Head: Normocephalic and atraumatic.  Eyes:     Conjunctiva/sclera: Conjunctivae normal.     Pupils: Pupils are equal, round, and reactive to light.  Cardiovascular:     Rate and Rhythm: Normal rate and regular rhythm.     Heart sounds: Normal heart sounds.  Pulmonary:     Effort: Pulmonary effort is normal.     Breath sounds: Normal breath sounds.  Abdominal:     General: Bowel sounds are normal. There is no distension.     Palpations: Abdomen is soft.     Tenderness: There is abdominal tenderness. There is no rebound.     Comments: No cva tenderness  Mild suprapubic tenderness  Musculoskeletal:     Cervical back: Normal range of motion and neck supple.  Lymphadenopathy:     Cervical: No cervical adenopathy.  Skin:    General: Skin is warm and dry.     Coloration: Skin is not jaundiced or pale.     Findings: No rash.  Neurological:     Mental Status: She is alert.  Psychiatric:        Mood and  Affect: Mood normal.           Assessment & Plan:   Problem List Items Addressed This Visit      Genitourinary   Acute cystitis - Primary    Not complicated and not recurrent Enc water intake tx with bactrim DS cx pending-will touch base when it returns Handout given inst to call if symptoms worsen        Relevant Orders   Urine Culture    Other Visit Diagnoses    Dysuria       Relevant Orders   POCT Urinalysis Dipstick (Automated) (Completed)

## 2019-06-27 LAB — URINE CULTURE
MICRO NUMBER:: 10132589
SPECIMEN QUALITY:: ADEQUATE

## 2019-06-28 ENCOUNTER — Ambulatory Visit: Payer: Medicare Other | Attending: Internal Medicine

## 2019-06-28 DIAGNOSIS — Z23 Encounter for immunization: Secondary | ICD-10-CM | POA: Insufficient documentation

## 2019-06-28 NOTE — Progress Notes (Signed)
   Covid-19 Vaccination Clinic  Name:  Jamie Dillon    MRN: [id number] DOB: September 05, 1950  06/28/2019  Jamie Dillon was observed post Covid-19 immunization for 15 minutes without incidence. She was provided with Vaccine Information Sheet and instruction to access the V-Safe system.   Jamie Dillon was instructed to call 911 with any severe reactions post vaccine: Marland Kitchen Difficulty breathing  . Swelling of your face and throat  . A fast heartbeat  . A bad rash all over your body  . Dizziness and weakness    Immunizations Administered    Name Date Dose VIS Date Route   Pfizer COVID-19 Vaccine 06/28/2019 11:42 AM 0.3 mL 04/26/2019 Intramuscular   Manufacturer: [place]   Lot: [id number]   [place]: [id number]

## 2019-07-02 ENCOUNTER — Encounter: Payer: Self-pay | Admitting: Family Medicine

## 2019-07-02 MED ORDER — FLUCONAZOLE 150 MG PO TABS: 150.0000 mg | ORAL_TABLET | Freq: Once | ORAL | 0 refills | Status: AC

## 2019-08-09 ENCOUNTER — Other Ambulatory Visit: Payer: Self-pay

## 2019-08-09 ENCOUNTER — Encounter: Payer: Self-pay | Admitting: Family Medicine

## 2019-08-09 ENCOUNTER — Ambulatory Visit (INDEPENDENT_AMBULATORY_CARE_PROVIDER_SITE_OTHER): Payer: Medicare Other | Admitting: Family Medicine

## 2019-08-09 VITALS — BP 122/78 | HR 99 | Temp 98.2°F | Ht 63.0 in | Wt 140.0 lb

## 2019-08-09 DIAGNOSIS — R300 Dysuria: Secondary | ICD-10-CM

## 2019-08-09 DIAGNOSIS — N3000 Acute cystitis without hematuria: Secondary | ICD-10-CM

## 2019-08-09 LAB — POC URINALSYSI DIPSTICK (AUTOMATED)
Bilirubin, UA: NEGATIVE
Blood, UA: 50
Glucose, UA: NEGATIVE
Ketones, UA: NEGATIVE
Nitrite, UA: NEGATIVE
Protein, UA: POSITIVE — AB
Spec Grav, UA: 1.02 (ref 1.010–1.025)
Urobilinogen, UA: 0.2 E.U./dL
pH, UA: 6.5 (ref 5.0–8.0)

## 2019-08-09 MED ORDER — CEPHALEXIN 250 MG PO CAPS: 250.0000 mg | ORAL_CAPSULE | Freq: Two times a day (BID) | ORAL | 0 refills | Status: DC

## 2019-08-09 NOTE — Assessment & Plan Note (Signed)
After complete symptom resolution with bactrim DS last time (pan sensitive e coli) - now symptoms returned this week , no with more dysuria Pos ua cx pend -will update  tx with keflex Disc ways to prevent utis  Update if no imp or worse

## 2019-08-09 NOTE — Patient Instructions (Addendum)
Drink lots of water  Don't hold urine too long  Take the cephalexin as directed   Wipe front to back  Urinate before and after sexual activity   We will email with culture result and see how you are doing    Urinary Tract Infection, Adult  A urinary tract infection (UTI) is an infection of any part of the urinary tract. The urinary tract includes the kidneys, ureters, bladder, and urethra. These organs make, store, and get rid of urine in the body. Your health care provider may use other names to describe the infection. An upper UTI affects the ureters and kidneys (pyelonephritis). A lower UTI affects the bladder (cystitis) and urethra (urethritis). What are the causes? Most urinary tract infections are caused by bacteria in your genital area, around the entrance to your urinary tract (urethra). These bacteria grow and cause inflammation of your urinary tract. What increases the risk? You are more likely to develop this condition if:  You have a urinary catheter that stays in place (indwelling).  You are not able to control when you urinate or have a bowel movement (you have incontinence).  You are female and you: ? Use a spermicide or diaphragm for birth control. ? Have low estrogen levels. ? Are pregnant.  You have certain genes that increase your risk (genetics).  You are sexually active.  You take antibiotic medicines.  You have a condition that causes your flow of urine to slow down, such as: ? An enlarged prostate, if you are female. ? Blockage in your urethra (stricture). ? A kidney stone. ? A nerve condition that affects your bladder control (neurogenic bladder). ? Not getting enough to drink, or not urinating often.  You have certain medical conditions, such as: ? Diabetes. ? A weak disease-fighting system (immunesystem). ? Sickle cell disease. ? Gout. ? Spinal cord injury. What are the signs or symptoms? Symptoms of this condition include:  Needing to urinate  right away (urgently).  Frequent urination or passing small amounts of urine frequently.  Pain or burning with urination.  Blood in the urine.  Urine that smells bad or unusual.  Trouble urinating.  Cloudy urine.  Vaginal discharge, if you are female.  Pain in the abdomen or the lower back. You may also have:  Vomiting or a decreased appetite.  Confusion.  Irritability or tiredness.  A fever.  Diarrhea. The first symptom in older adults may be confusion. In some cases, they may not have any symptoms until the infection has worsened. How is this diagnosed? This condition is diagnosed based on your medical history and a physical exam. You may also have other tests, including:  Urine tests.  Blood tests.  Tests for sexually transmitted infections (STIs). If you have had more than one UTI, a cystoscopy or imaging studies may be done to determine the cause of the infections. How is this treated? Treatment for this condition includes:  Antibiotic medicine.  Over-the-counter medicines to treat discomfort.  Drinking enough water to stay hydrated. If you have frequent infections or have other conditions such as a kidney stone, you may need to see a health care provider who specializes in the urinary tract (urologist). In rare cases, urinary tract infections can cause sepsis. Sepsis is a life-threatening condition that occurs when the body responds to an infection. Sepsis is treated in the hospital with IV antibiotics, fluids, and other medicines. Follow these instructions at home:  Medicines  Take over-the-counter and prescription medicines only as told by  your health care provider.  If you were prescribed an antibiotic medicine, take it as told by your health care provider. Do not stop using the antibiotic even if you start to feel better. General instructions  Make sure you: ? Empty your bladder often and completely. Do not hold urine for long periods of  time. ? Empty your bladder after sex. ? Wipe from front to back after a bowel movement if you are female. Use each tissue one time when you wipe.  Drink enough fluid to keep your urine pale yellow.  Keep all follow-up visits as told by your health care provider. This is important. Contact a health care provider if:  Your symptoms do not get better after 1-2 days.  Your symptoms go away and then return. Get help right away if you have:  Severe pain in your back or your lower abdomen.  A fever.  Nausea or vomiting. Summary  A urinary tract infection (UTI) is an infection of any part of the urinary tract, which includes the kidneys, ureters, bladder, and urethra.  Most urinary tract infections are caused by bacteria in your genital area, around the entrance to your urinary tract (urethra).  Treatment for this condition often includes antibiotic medicines.  If you were prescribed an antibiotic medicine, take it as told by your health care provider. Do not stop using the antibiotic even if you start to feel better.  Keep all follow-up visits as told by your health care provider. This is important. This information is not intended to replace advice given to you by your health care provider. Make sure you discuss any questions you have with your health care provider. Document Revised: 04/19/2018 Document Reviewed: 11/09/2017 Elsevier Patient Education  2020 [employer].

## 2019-08-09 NOTE — Progress Notes (Signed)
Subjective:    Patient ID: Jamie Dillon, female    DOB: [birth year], 69 y.o.   MRN: [id number]  This visit occurred during the SARS-CoV-2 public health emergency.  Safety protocols were in place, including screening questions prior to the visit, additional usage of staff PPE, and extensive cleaning of exam room while observing appropriate contact time as indicated for disinfecting solutions.    HPI Pt presents for urinary symptoms   Wt Readings from Last 3 Encounters:  08/09/19 140 lb (63.5 kg)  06/25/19 138 lb 6 oz (62.8 kg)  06/10/19 139 lb 5 oz (63.2 kg)   24.80 kg/m   Last uti was in feb  Pan sensitive e coli  Was tx with bactrim DS She did get better   This episode started wed night  Worse now  Pain to urinate - burning  No bladder pain but has fullness in the front  No fever or nausea Has frequency and urgency   No visible blood in urine  No flank pain   Trying to drink more water in general    ua today: Results for orders placed or performed in visit on 08/09/19  POCT Urinalysis Dipstick (Automated)  Result Value Ref Range   Color, UA Yellow    Clarity, UA Cloudy    Glucose, UA Negative Negative   Bilirubin, UA Negative    Ketones, UA Negative    Spec Grav, UA 1.020 1.010 - 1.025   Blood, UA 50 Ery/uL    pH, UA 6.5 5.0 - 8.0   Protein, UA Positive (A) Negative   Urobilinogen, UA 0.2 0.2 or 1.0 E.U./dL   Nitrite, UA Negative    Leukocytes, UA Moderate (2+) (A) Negative    Patient Active Problem List   Diagnosis Date Noted  . Acute cystitis 06/25/2019  . Gastritis 09/19/2018  . Aortic atherosclerosis ([place]) 06/22/2017  . Sinus tachycardia 09/21/2014  . Anxiety 07/08/2014  . History of breast cancer 06/19/2014  . Encounter for Medicare annual wellness exam 04/25/2014  . Estrogen deficiency 04/25/2014  . Stress reaction 01/03/2014  . Routine general medical examination at a health care facility 02/26/2011  . HYPERCHOLESTEROLEMIA, PURE 02/01/2007  .  Essential hypertension 09/08/2006  . ALLERGIC RHINITIS 09/08/2006  . TOBACCO ABUSE, HX OF 09/08/2006   Past Medical History:  Diagnosis Date  . Allergic rhinitis   . Allergy   . Anxiety   . Arthritis    back, right hand, ? knees   . Breast cancer of lower-outer quadrant of right female breast ([place]) 06/19/2014  . Cataract 03/2018   bilateral eyes  . Disorder of bone and cartilage, unspecified   . Family history of malignant neoplasm of breast   . Family history of other kidney diseases   . GERD (gastroesophageal reflux disease)   . History of chemotherapy 2016  . History of radiation therapy 2016  . HLD (hyperlipidemia)   . HTN (hypertension)   . PONV (postoperative nausea and vomiting)   . Tobacco abuse    Past Surgical History:  Procedure Laterality Date  . ABDOMINAL HYSTERECTOMY    . APPENDECTOMY    . BREAST LUMPECTOMY WITH NEEDLE LOCALIZATION AND AXILLARY SENTINEL LYMPH NODE BX Right 07/21/2014   Procedure: BREAST LUMPECTOMY WITH NEEDLE LOCALIZATION AND AXILLARY SENTINEL LYMPH NODE BIOPSY;  Surgeon: Rolm Bookbinder, MD;  Location: [facility];  Service: General;  Laterality: Right;  . COLONOSCOPY    . DILATION AND CURETTAGE OF UTERUS    . PORT-A-CATH REMOVAL N/A 03/12/2015  Procedure: REMOVAL PORT-A-CATH;  Surgeon: Rolm Bookbinder, MD;  Location: [facility];  Service: General;  Laterality: N/A;  . PORTACATH PLACEMENT Right 09/10/2014   Procedure: INSERTION PORT-A-CATH;  Surgeon: Rolm Bookbinder, MD;  Location: [facility];  Service: General;  Laterality: Right;  . TOTAL VAGINAL HYSTERECTOMY  1996   endometriosis   Social History   Tobacco Use  . Smoking status: Former Smoker    Packs/day: 1.00    Types: Cigarettes    Quit date: 06/25/2010    Years since quitting: 9.1  . Smokeless tobacco: Never Used  Substance Use Topics  . Alcohol use: Yes    Alcohol/week: 0.0 standard drinks    Comment: rarely wine/ 2x per year  . Drug use: No    Family History  Problem Relation Age of Onset  . Hypertension Mother   . Kidney disease Father   . Breast cancer Sister 58  . Diabetes Paternal Aunt   . Colon cancer Neg Hx   . Colon polyps Neg Hx   . Esophageal cancer Neg Hx   . Stomach cancer Neg Hx   . Rectal cancer Neg Hx    Allergies  Allergen Reactions  . Dust Mite Extract   . Pollen Extract    Current Outpatient Medications on File Prior to Visit  Medication Sig Dispense Refill  . ALPRAZolam (XANAX) 0.5 MG tablet TAKE 1 TABLET BY MOUTH EVERY DAY AS NEEDED 30 tablet 0  . aspirin 81 MG chewable tablet Chew 81 mg by mouth daily.    Marland Kitchen atorvastatin (LIPITOR) 10 MG tablet Take 1 tablet (10 mg total) by mouth daily. 90 tablet 3  . b complex vitamins tablet Take 1 tablet by mouth daily.     . Calcium Carbonate-Vitamin D (CALCIUM-VITAMIN D) 500-200 MG-UNIT per tablet Take 3 tablets by mouth every morning.      . diphenhydrAMINE (BENADRYL) 25 MG tablet Take 25 mg by mouth daily as needed for allergies.     Mariane Baumgarten Calcium (STOOL SOFTENER PO) Take 2 capsules by mouth daily.    . Glucosamine-Chondroit-Vit C-Mn (GLUCOSAMINE 1500 COMPLEX) CAPS Take by mouth.    Marland Kitchen lisinopril-hydrochlorothiazide (ZESTORETIC) 20-25 MG tablet Take 1 tablet by mouth daily. 90 tablet 3  . metoprolol succinate (TOPROL-XL) 50 MG 24 hr tablet TAKE 1 TABLET EVERY DAY WITH OR IMMEDIATELY FOLLOWING A MEAL 90 tablet 3  . Multiple Vitamin (MULTIVITAMIN) tablet Take 1 tablet by mouth daily.      Marland Kitchen omeprazole (PRILOSEC) 20 MG capsule Take 1 capsule (20 mg total) by mouth 2 (two) times daily before a meal. 180 capsule 3  . tamoxifen (NOLVADEX) 20 MG tablet Take 1 tablet (20 mg total) by mouth daily. 90 tablet 4   No current facility-administered medications on file prior to visit.     Review of Systems  Constitutional: Negative for activity change, appetite change, fatigue, fever and unexpected weight change.  HENT: Negative for congestion, ear pain, rhinorrhea,  sinus pressure and sore throat.   Eyes: Negative for pain, redness and visual disturbance.  Respiratory: Negative for cough, shortness of breath and wheezing.   Cardiovascular: Negative for chest pain and palpitations.  Gastrointestinal: Negative for abdominal pain, blood in stool, constipation and diarrhea.  Endocrine: Negative for polydipsia and polyuria.  Genitourinary: Positive for dysuria, frequency and urgency. Negative for decreased urine volume.  Musculoskeletal: Negative for arthralgias, back pain and myalgias.  Skin: Negative for pallor and rash.  Allergic/Immunologic: Negative for environmental allergies.  Neurological: Negative for dizziness, syncope and headaches.  Hematological: Negative for adenopathy. Does not bruise/bleed easily.  Psychiatric/Behavioral: Negative for decreased concentration and dysphoric mood. The patient is not nervous/anxious.        Objective:   Physical Exam Constitutional:      General: She is not in acute distress.    Appearance: Normal appearance. She is well-developed and normal weight. She is not ill-appearing.  HENT:     Head: Normocephalic and atraumatic.  Eyes:     Conjunctiva/sclera: Conjunctivae normal.     Pupils: Pupils are equal, round, and reactive to light.  Cardiovascular:     Rate and Rhythm: Normal rate and regular rhythm.     Heart sounds: Normal heart sounds.  Pulmonary:     Effort: Pulmonary effort is normal.     Breath sounds: Normal breath sounds.  Abdominal:     General: Bowel sounds are normal. There is no distension.     Palpations: Abdomen is soft.     Tenderness: There is abdominal tenderness. There is no right CVA tenderness, left CVA tenderness or rebound.     Comments: No cva tenderness  Mild suprapubic tenderness  Musculoskeletal:     Cervical back: Normal range of motion and neck supple.  Lymphadenopathy:     Cervical: No cervical adenopathy.  Skin:    Findings: No rash.  Neurological:     Mental  Status: She is alert.           Assessment & Plan:   Problem List Items Addressed This Visit      Genitourinary   Acute cystitis - Primary    After complete symptom resolution with bactrim DS last time (pan sensitive e coli) - now symptoms returned this week , no with more dysuria Pos ua cx pend -will update  tx with keflex Disc ways to prevent utis  Update if no imp or worse       Relevant Orders   Urine Culture    Other Visit Diagnoses    Dysuria       Relevant Orders   POCT Urinalysis Dipstick (Automated) (Completed)

## 2019-08-11 LAB — URINE CULTURE
MICRO NUMBER:: 10297142
SPECIMEN QUALITY:: ADEQUATE

## 2019-09-30 ENCOUNTER — Other Ambulatory Visit: Payer: Self-pay | Admitting: Family Medicine

## 2019-09-30 NOTE — Telephone Encounter (Signed)
Electronic refill request. Alprazolam Last office visit:   08/09/2019 Last Filled:    30 tablet 0 02/26/2019  Please advise.

## 2020-01-07 ENCOUNTER — Ambulatory Visit (INDEPENDENT_AMBULATORY_CARE_PROVIDER_SITE_OTHER): Payer: Medicare Other | Admitting: Family Medicine

## 2020-01-07 ENCOUNTER — Other Ambulatory Visit: Payer: Self-pay

## 2020-01-07 ENCOUNTER — Encounter: Payer: Self-pay | Admitting: Family Medicine

## 2020-01-07 VITALS — BP 132/76 | HR 77 | Temp 96.9°F | Ht 63.0 in | Wt 134.0 lb

## 2020-01-07 DIAGNOSIS — N3000 Acute cystitis without hematuria: Secondary | ICD-10-CM | POA: Diagnosis not present

## 2020-01-07 DIAGNOSIS — R300 Dysuria: Secondary | ICD-10-CM | POA: Diagnosis not present

## 2020-01-07 LAB — POC URINALSYSI DIPSTICK (AUTOMATED)
Bilirubin, UA: NEGATIVE
Blood, UA: 25
Glucose, UA: NEGATIVE
Ketones, UA: NEGATIVE
Nitrite, UA: NEGATIVE
Protein, UA: POSITIVE — AB
Spec Grav, UA: 1.02 (ref 1.010–1.025)
Urobilinogen, UA: 0.2 E.U./dL
pH, UA: 6.5 (ref 5.0–8.0)

## 2020-01-07 MED ORDER — SULFAMETHOXAZOLE-TRIMETHOPRIM 800-160 MG PO TABS: 1.0000 | ORAL_TABLET | Freq: Two times a day (BID) | ORAL | 0 refills | Status: DC

## 2020-01-07 NOTE — Progress Notes (Signed)
Subjective:    Patient ID: Jamie Dillon, female    DOB: [birth year], 69 y.o.   MRN: [id number]  This visit occurred during the SARS-CoV-2 public health emergency.  Safety protocols were in place, including screening questions prior to the visit, additional usage of staff PPE, and extensive cleaning of exam room while observing appropriate contact time as indicated for disinfecting solutions.    HPI Pt presents with urinary symptoms   Wt Readings from Last 3 Encounters:  01/07/20 134 lb (60.8 kg)  08/09/19 140 lb (63.5 kg)  06/25/19 138 lb 6 oz (62.8 kg)   23.74 kg/m  Symptoms started over the weekend Drank water and cranberry juice- improved Then worse last night   Feels like a fullness in bladder Pressure to urinate and a little pain  No blood in urine  Urine did look cloudy with an odor  Used an otc test-positive  Drinking lots of water No fever or nausea  No flank pain   UA Results for orders placed or performed in visit on 01/07/20  POCT Urinalysis Dipstick (Automated)  Result Value Ref Range   Color, UA Yellow    Clarity, UA Hazy    Glucose, UA Negative Negative   Bilirubin, UA Negative    Ketones, UA Negative    Spec Grav, UA 1.020 1.010 - 1.025   Blood, UA 25 Ery/uL    pH, UA 6.5 5.0 - 8.0   Protein, UA Positive (A) Negative   Urobilinogen, UA 0.2 0.2 or 1.0 E.U./dL   Nitrite, UA Negative    Leukocytes, UA Small (1+) (A) Negative       Last uti in march - culture grew proteus (resistant to macrobid)-treated with keflex  Prior to that -was e coli (treated with bactrim)  Lab Results  Component Value Date   CREATININE 0.89 06/03/2019   BUN 21 06/03/2019   NA 140 06/03/2019   K 3.8 06/03/2019   CL 105 06/03/2019   CO2 26 06/03/2019   She takes tamoxifen for breast cancer   Patient Active Problem List   Diagnosis Date Noted   Acute cystitis 06/25/2019   Gastritis 09/19/2018   Aortic atherosclerosis (HCC) 06/22/2017   Sinus tachycardia  09/21/2014   Anxiety 07/08/2014   History of breast cancer 06/19/2014   Encounter for Medicare annual wellness exam 04/25/2014   Estrogen deficiency 04/25/2014   Stress reaction 01/03/2014   Routine general medical examination at a health care facility 02/26/2011   HYPERCHOLESTEROLEMIA, PURE 02/01/2007   Essential hypertension 09/08/2006   ALLERGIC RHINITIS 09/08/2006   TOBACCO ABUSE, HX OF 09/08/2006   Past Medical History:  Diagnosis Date   Allergic rhinitis    Allergy    Anxiety    Arthritis    back, right hand, ? knees    Breast cancer of lower-outer quadrant of right female breast ([place]) 06/19/2014   Cataract 03/2018   bilateral eyes   Disorder of bone and cartilage, unspecified    Family history of malignant neoplasm of breast    Family history of other kidney diseases    GERD (gastroesophageal reflux disease)    History of chemotherapy 2016   History of radiation therapy 2016   HLD (hyperlipidemia)    HTN (hypertension)    PONV (postoperative nausea and vomiting)    Tobacco abuse    Past Surgical History:  Procedure Laterality Date   ABDOMINAL HYSTERECTOMY     APPENDECTOMY     BREAST LUMPECTOMY WITH NEEDLE LOCALIZATION AND AXILLARY SENTINEL  LYMPH NODE BX Right 07/21/2014   Procedure: BREAST LUMPECTOMY WITH NEEDLE LOCALIZATION AND AXILLARY SENTINEL LYMPH NODE BIOPSY;  Surgeon: Rolm Bookbinder, MD;  Location: [facility];  Service: General;  Laterality: Right;   COLONOSCOPY     DILATION AND CURETTAGE OF UTERUS     PORT-A-CATH REMOVAL N/A 03/12/2015   Procedure: REMOVAL PORT-A-CATH;  Surgeon: Rolm Bookbinder, MD;  Location: [facility];  Service: General;  Laterality: N/A;   PORTACATH PLACEMENT Right 09/10/2014   Procedure: INSERTION PORT-A-CATH;  Surgeon: Rolm Bookbinder, MD;  Location: [facility];  Service: General;  Laterality: Right;   TOTAL VAGINAL HYSTERECTOMY  1996   endometriosis   Social History    Tobacco Use   Smoking status: Former Smoker    Packs/day: 1.00    Types: Cigarettes    Quit date: 06/25/2010    Years since quitting: 9.5   Smokeless tobacco: Never Used  Vaping Use   Vaping Use: Never used  Substance Use Topics   Alcohol use: Yes    Alcohol/week: 0.0 standard drinks    Comment: rarely wine/ 2x per year   Drug use: No   Family History  Problem Relation Age of Onset   Hypertension Mother    Kidney disease Father    Breast cancer Sister 56   Diabetes Paternal Aunt    Colon cancer Neg Hx    Colon polyps Neg Hx    Esophageal cancer Neg Hx    Stomach cancer Neg Hx    Rectal cancer Neg Hx    Allergies  Allergen Reactions   Dust Mite Extract    Pollen Extract    Current Outpatient Medications on File Prior to Visit  Medication Sig Dispense Refill   ALPRAZolam (XANAX) 0.5 MG tablet TAKE 1 TABLET BY MOUTH EVERY DAY AS NEEDED 30 tablet 0   aspirin 81 MG chewable tablet Chew 81 mg by mouth daily.     atorvastatin (LIPITOR) 10 MG tablet Take 1 tablet (10 mg total) by mouth daily. 90 tablet 3   b complex vitamins tablet Take 1 tablet by mouth daily.      Calcium Carbonate-Vitamin D (CALCIUM-VITAMIN D) 500-200 MG-UNIT per tablet Take 3 tablets by mouth every morning.       diphenhydrAMINE (BENADRYL) 25 MG tablet Take 25 mg by mouth daily as needed for allergies.      Docusate Calcium (STOOL SOFTENER PO) Take 2 capsules by mouth daily.     Glucosamine-Chondroit-Vit C-Mn (GLUCOSAMINE 1500 COMPLEX) CAPS Take by mouth.     lisinopril-hydrochlorothiazide (ZESTORETIC) 20-25 MG tablet Take 1 tablet by mouth daily. 90 tablet 3   metoprolol succinate (TOPROL-XL) 50 MG 24 hr tablet TAKE 1 TABLET EVERY DAY WITH OR IMMEDIATELY FOLLOWING A MEAL 90 tablet 3   Multiple Vitamin (MULTIVITAMIN) tablet Take 1 tablet by mouth daily.       omeprazole (PRILOSEC) 20 MG capsule Take 1 capsule (20 mg total) by mouth 2 (two) times daily before a meal. 180 capsule  3   tamoxifen (NOLVADEX) 20 MG tablet Take 1 tablet (20 mg total) by mouth daily. 90 tablet 4   No current facility-administered medications on file prior to visit.    Review of Systems  Constitutional: Positive for fatigue. Negative for activity change, appetite change and fever.  HENT: Negative for congestion and sore throat.   Eyes: Negative for itching and visual disturbance.  Respiratory: Negative for cough and shortness of breath.   Cardiovascular: Negative for leg swelling.  Gastrointestinal: Negative for abdominal distention, abdominal pain, constipation, diarrhea and nausea.  Endocrine: Negative for cold intolerance and polydipsia.  Genitourinary: Positive for dysuria, frequency and urgency. Negative for difficulty urinating, flank pain and hematuria.  Musculoskeletal: Negative for myalgias.  Skin: Negative for rash.  Allergic/Immunologic: Negative for immunocompromised state.  Neurological: Negative for dizziness and weakness.  Hematological: Negative for adenopathy.       Objective:   Physical Exam Constitutional:      General: She is not in acute distress.    Appearance: Normal appearance. She is well-developed and normal weight. She is not ill-appearing or diaphoretic.  HENT:     Head: Normocephalic and atraumatic.  Eyes:     Conjunctiva/sclera: Conjunctivae normal.     Pupils: Pupils are equal, round, and reactive to light.  Cardiovascular:     Rate and Rhythm: Normal rate and regular rhythm.     Heart sounds: Normal heart sounds.  Pulmonary:     Effort: Pulmonary effort is normal.     Breath sounds: Normal breath sounds.  Abdominal:     General: Bowel sounds are normal. There is no distension.     Palpations: Abdomen is soft.     Tenderness: There is abdominal tenderness. There is no rebound.     Comments: No cva tenderness  Mild suprapubic pressure on palpation (not pain)  Musculoskeletal:     Cervical back: Normal range of motion and neck supple.   Lymphadenopathy:     Cervical: No cervical adenopathy.  Skin:    General: Skin is warm and dry.     Findings: No rash.  Neurological:     Mental Status: She is alert.  Psychiatric:        Mood and Affect: Mood normal.           Assessment & Plan:   Problem List Items Addressed This Visit      Genitourinary   Acute cystitis - Primary    Last uti was in march - proteus  ua pos today and symptomatic but reassuring exam  Px bactrim ds for 5 d Pending culture  inst to call if symptoms worsen  Enc good water intake  ? If incomplete emptying is causing more uti  Also on tamoxifen       Relevant Orders   Urine Culture    Other Visit Diagnoses    Dysuria       Relevant Orders   POCT Urinalysis Dipstick (Automated) (Completed)

## 2020-01-07 NOTE — Patient Instructions (Addendum)
Drink lots of water  Take the bactrim as directed (can take with food)  Be careful in the sun - it can make you burn more easily   If symptoms suddenly worsen  We will call you with culture results

## 2020-01-07 NOTE — Assessment & Plan Note (Signed)
Last uti was in march - proteus  ua pos today and symptomatic but reassuring exam  Px bactrim ds for 5 d Pending culture  inst to call if symptoms worsen  Enc good water intake  ? If incomplete emptying is causing more uti  Also on tamoxifen

## 2020-01-10 LAB — URINE CULTURE
MICRO NUMBER:: 10865005
SPECIMEN QUALITY:: ADEQUATE

## 2020-02-23 NOTE — Progress Notes (Signed)
[facility]  [phone] [phone]     ID: [name] DOB: Nov 18, 1950  MR#: [id number]  GYK#:[id number]  Patient Care Team: Abner Greenspan, MD as PCP - General Rolm Bookbinder, MD as Consulting Physician (General Surgery) Shantai Tiedeman, Virgie Dad, MD as Consulting Physician (Oncology) Eppie Gibson, MD as Attending Physician (Radiation Oncology) Rockwell Germany, RN as Registered Nurse Mauro Kaufmann, RN as Registered Nurse Holley Bouche, NP (Inactive) as Nurse Practitioner (Nurse Practitioner) Sylvan Cheese, NP as Nurse Practitioner (Nurse Practitioner) OTHER MD:  CHIEF COMPLAINT:  Estrogen receptor positive breast cancer  CURRENT TREATMENT: Tamoxifen   INTERVAL HISTORY: Jamie Dillon returns today for follow-up of her estrogen receptor positive breast cancer.   She continues on tamoxifen.  She has tolerated that remarkably well and she no longer has the hot flashes she had originally.  Vaginal wetness has never been a problem  Since her last visit, she underwent bilateral diagnostic mammography with tomography at [facility] on 08/12/2019 showing: breast density category A; no evidence of malignancy in either breast.   REVIEW OF SYSTEMS: [employer] retired in 2020.  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  They are taking care of the grandson 2 days a week.  She is hoping to start a walking program but her mother died in 01/19/2023 and her brother in law died last week and this is, overwhelmed her and her husband.  She has had her Pfizer vaccine x2 and is scheduled for the booster later this week.  Aside from all that a detailed review of systems today was stable   BREAST CANCER HISTORY: From the original intake note:  Cristan had routine screening mammography at [facility] 06/05/2014. The breast density was category B. There was a 6 mm irregular mass in  the right breast. On 06/11/2014 the patient underwent right breast ultrasonography at [facility]. This confirmed a 7 mm all her than wide irregular mass in the right blast at the 8:00 position. 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  The patient's subsequent history is as detailed below   PAST MEDICAL HISTORY: Past Medical History:  Diagnosis Date  . Allergic rhinitis   . Allergy   . Anxiety   . Arthritis    back, right hand, ? knees   . Breast cancer of lower-outer quadrant of right female breast ([place]) 06/19/2014  . Cataract 03/2018   bilateral eyes  . Disorder of bone and cartilage, unspecified   . Family history of malignant neoplasm of breast   . Family history of other kidney diseases   . GERD (gastroesophageal reflux disease)   . History of chemotherapy 2016  . History of radiation therapy 2016  . HLD (hyperlipidemia)   . HTN (hypertension)   . PONV (postoperative nausea and vomiting)   . Tobacco abuse     PAST SURGICAL HISTORY: Past Surgical History:  Procedure Laterality Date  . ABDOMINAL HYSTERECTOMY    . APPENDECTOMY    . BREAST LUMPECTOMY WITH NEEDLE LOCALIZATION AND AXILLARY SENTINEL LYMPH NODE BX Right 07/21/2014   Procedure: BREAST LUMPECTOMY WITH NEEDLE LOCALIZATION AND AXILLARY SENTINEL LYMPH NODE BIOPSY;  Surgeon:  Rolm Bookbinder, MD;  Location: [place];  Service: General;  Laterality: Right;  . COLONOSCOPY    . DILATION AND CURETTAGE OF UTERUS    . PORT-A-CATH REMOVAL N/A 03/12/2015   Procedure: REMOVAL PORT-A-CATH;  Surgeon: Rolm Bookbinder, MD;  Location: [place];  Service: General;  Laterality: N/A;  . PORTACATH PLACEMENT Right 09/10/2014   Procedure:  INSERTION PORT-A-CATH;  Surgeon: Rolm Bookbinder, MD;  Location: [facility];  Service: General;  Laterality: Right;  . TOTAL VAGINAL HYSTERECTOMY  1996   endometriosis    FAMILY HISTORY Family History  Problem Relation Age of Onset  . Hypertension Mother   . Kidney disease Father   . Breast cancer Sister 33  . Diabetes Paternal Aunt   . Colon cancer Neg Hx   . Colon polyps Neg Hx   . Esophageal cancer Neg Hx   . Stomach cancer Neg Hx   . Rectal cancer Neg Hx    the patient's father died from kidney failure at the age of 20. The patient's mother is still living at age 28. The patient had no brothers, one sister. That sister was diagnosed with breast cancer at age 42. The patient's father's mother was diagnosed with mouth cancer at the age of 21.   GYNECOLOGIC HISTORY:  No LMP recorded. Patient has had a hysterectomy. Menarche age 67, first live birth age 17. The patient is GX P2. She had a total abdominal hysterectomy with bilateral salpingo-oophorectomy in 1996. She took hormone replacement for approximately 10 years, until 2009. She also took oral contraceptives for approximately 7 years remotely, with no complications   SOCIAL HISTORY:  The patient is currently retired, though she is considering working part-time at the [employer] Her husband "[name]" Clinton Wahlberg is retired from Press photographer. Son [name] "[name]" Votta teaches English at [employer] in [place]. Daughter [name] lives in [place] . She works as a Marine scientist at the [employer]: In [place]: Social History   Tobacco Use  . Smoking status: Former Smoker    Packs/day: 1.00    Types: Cigarettes    Quit date: 06/25/2010    Years since quitting: 9.6  . Smokeless tobacco: Never Used  Vaping Use  . Vaping Use: Never used  Substance Use Topics  . Alcohol use: Yes    Alcohol/week: 0.0 standard drinks    Comment:  rarely wine/ 2x per year  . Drug use: No     Colonoscopy: 2010  PAP:  Bone density: December 2015  Lipid panel:  Allergies  Allergen Reactions  . Dust Mite Extract   . Pollen Extract     Current Outpatient Medications  Medication Sig Dispense Refill  . ALPRAZolam (XANAX) 0.5 MG tablet TAKE 1 TABLET BY MOUTH EVERY DAY AS NEEDED 30 tablet 0  . aspirin 81 MG chewable tablet Chew 81 mg by mouth daily.    Marland Kitchen atorvastatin (LIPITOR) 10 MG tablet Take 1 tablet (10 mg total) by mouth daily. 90 tablet 3  . b complex vitamins tablet Take 1 tablet by mouth daily.     . Calcium Carbonate-Vitamin D (CALCIUM-VITAMIN D) 500-200 MG-UNIT per tablet Take 3 tablets by mouth every morning.      . diphenhydrAMINE (BENADRYL) 25 MG tablet Take 25 mg by mouth daily as needed for allergies.     Mariane Baumgarten Calcium (STOOL SOFTENER PO) Take 2 capsules by mouth daily.    Marland Kitchen  Glucosamine-Chondroit-Vit C-Mn (GLUCOSAMINE 1500 COMPLEX) CAPS Take by mouth.    Marland Kitchen lisinopril-hydrochlorothiazide (ZESTORETIC) 20-25 MG tablet Take 1 tablet by mouth daily. 90 tablet 3  . metoprolol succinate (TOPROL-XL) 50 MG 24 hr tablet TAKE 1 TABLET EVERY DAY WITH OR IMMEDIATELY FOLLOWING A MEAL 90 tablet 3  . Multiple Vitamin (MULTIVITAMIN) tablet Take 1 tablet by mouth daily.      Marland Kitchen omeprazole (PRILOSEC) 20 MG capsule Take 1 capsule (20 mg total) by mouth 2 (two) times daily before a meal. 180 capsule 3  . sulfamethoxazole-trimethoprim (BACTRIM DS) 800-160 MG tablet Take 1 tablet by mouth 2 (two) times daily. 10 tablet 0  . tamoxifen (NOLVADEX) 20 MG tablet Take 1 tablet (20 mg total) by mouth daily. 90 tablet 4   No current facility-administered medications for this visit.    OBJECTIVE: White woman who appears well  Vitals:   02/24/20 1119  BP: (!) 144/91  Pulse: 87  Resp: 17  Temp: 98.1 F (36.7 C)  SpO2: 100%     Body mass index is 23.4 kg/m.    ECOG FS:1 - Symptomatic but completely ambulatory  Sclerae unicteric, EOMs  intact Wearing a mask No cervical or supraclavicular adenopathy Lungs no rales or rhonchi Heart regular rate and rhythm Abd soft, nontender, positive bowel sounds MSK no focal spinal tenderness, no upper extremity lymphedema Neuro: nonfocal, well oriented, appropriate affect Breasts: The right breast has undergone lumpectomy and radiation.  There is no evidence of local recurrence per the left breast is benign.  Both axillae are benign.   LAB RESULTS:  CMP     Component Value Date/Time   NA 140 06/03/2019 0840   NA 141 02/15/2017 1149   K 3.8 06/03/2019 0840   K 3.6 02/15/2017 1149   CL 105 06/03/2019 0840   CO2 26 06/03/2019 0840   CO2 25 02/15/2017 1149   GLUCOSE 89 06/03/2019 0840   GLUCOSE 82 02/15/2017 1149   BUN 21 06/03/2019 0840   BUN 16.2 02/15/2017 1149   CREATININE 0.89 06/03/2019 0840   CREATININE 0.9 02/15/2017 1149   CALCIUM 9.7 06/03/2019 0840   CALCIUM 10.1 02/15/2017 1149   PROT 7.2 06/03/2019 0840   PROT 7.2 02/15/2017 1149   ALBUMIN 4.1 06/03/2019 0840   ALBUMIN 4.0 02/15/2017 1149   AST 34 06/03/2019 0840   AST 26 02/15/2017 1149   ALT 30 06/03/2019 0840   ALT 27 02/15/2017 1149   ALKPHOS 56 06/03/2019 0840   ALKPHOS 55 02/15/2017 1149   BILITOT 0.5 06/03/2019 0840   BILITOT 0.50 02/15/2017 1149   GFRNONAA >60 02/14/2019 1113   GFRAA >60 02/14/2019 1113    INo results found for: SPEP, UPEP  Lab Results  Component Value Date   WBC 8.5 06/03/2019   NEUTROABS 4.8 06/03/2019   HGB 13.1 06/03/2019   HCT 38.5 06/03/2019   MCV 96.7 06/03/2019   PLT 238.0 06/03/2019      Chemistry      Component Value Date/Time   NA 140 06/03/2019 0840   NA 141 02/15/2017 1149   K 3.8 06/03/2019 0840   K 3.6 02/15/2017 1149   CL 105 06/03/2019 0840   CO2 26 06/03/2019 0840   CO2 25 02/15/2017 1149   BUN 21 06/03/2019 0840   BUN 16.2 02/15/2017 1149   CREATININE 0.89 06/03/2019 0840   CREATININE 0.9 02/15/2017 1149      Component Value Date/Time    CALCIUM 9.7 06/03/2019 0840   CALCIUM 10.1 02/15/2017  1149   ALKPHOS 56 06/03/2019 0840   ALKPHOS 55 02/15/2017 1149   AST 34 06/03/2019 0840   AST 26 02/15/2017 1149   ALT 30 06/03/2019 0840   ALT 27 02/15/2017 1149   BILITOT 0.5 06/03/2019 0840   BILITOT 0.50 02/15/2017 1149       No results found for: LABCA2  No components found for: LABCA125  No results for input(s): INR in the last 168 hours.  Urinalysis    Component Value Date/Time   COLORURINE YELLOW 09/21/2014 1809   APPEARANCEUR CLOUDY (A) 09/21/2014 1809   LABSPEC 1.009 09/21/2014 1809   PHURINE 7.0 09/21/2014 1809   GLUCOSEU NEGATIVE 09/21/2014 1809   HGBUR MODERATE (A) 09/21/2014 1809   HGBUR negative 02/13/2008 0911   BILIRUBINUR Negative 01/07/2020 1510   KETONESUR NEGATIVE 09/21/2014 1809   PROTEINUR Positive (A) 01/07/2020 1510   PROTEINUR 30 (A) 09/21/2014 1809   UROBILINOGEN 0.2 01/07/2020 1510   UROBILINOGEN 0.2 09/21/2014 1809   NITRITE Negative 01/07/2020 1510   NITRITE POSITIVE (A) 09/21/2014 1809   LEUKOCYTESUR Small (1+) (A) 01/07/2020 1510    STUDIES: No results found.   ASSESSMENT: 69 y.o. [place] woman status post right breastLower outer quadrant biopsy 06/17/2014 for a clinical T1b N0, stage I invasive ductal carcinoma, grade 2, estrogen receptor strongly positive, progesterone receptor moderately positive, with no HER-2 amplification and an MIB-1 of 22%.  (1) status post right lumpectomy and right axillary sentinel lymph node sampling 07/21/2014 for a pT1b pN1a, stage IIA invasive ductal carcinoma, grade 1, with negative margins, repeat HER-2/neu again negative.  (2) adjuvant chemotherapy consisting of cyclophosphamide and docetaxel started 09/15/2014, given every 3 weeks with Neulasta support, completed 11/18/2014  (a) Neutropenic fever and hospitalization 5 days after the first cycle: subsequent cycles 10% dose reduced  (3) adjuvant radiation 12/10/2014-01/21/2015: Right breast/  axilla // 50 Gy in 25 fractions Right breast boost / 10 Gy in 5 fractions  (4) started tamoxifen 02/14/2015, completing 5 years October 2021  (5) osteopenia: bone density January 2016 shows a T score of -1.1 at the femoral neck  (a) bone density 05/23/2017 shows a T score of -0.7 at the femoral neck (normal)   PLAN: Leyah is now 5-1/2 years out from definitive surgery for breast cancer with no evidence of disease recurrence.  This is very favorable.  She is completing 5 years of tamoxifen this month.  There is no need to taper off that medication.  She can simply stop it.  I suspect she will not notice any significant difference as she has had no major side effects from the tamoxifen.  At this point I feel comfortable releasing her to her primary care physician's.  All she will need in terms of breast cancer follow-up is her yearly mammography and a yearly physician breast exam  I will be glad to see [place] again at any point in the future if and when the need arises but as of now are making no further routine appointments for her here  Total encounter time 20 minutes.*  Mariane Burpee, Virgie Dad, MD  02/24/20 11:21 AM Medical Oncology and Hematology [facility] [place], [place] [address] Tel. [phone]    Fax. [phone]    I, Wilburn Mylar, am acting as scribe for Dr. Virgie Dad. Lutisha Knoche.  I, Lurline Del MD, have reviewed the above documentation for accuracy and completeness, and I agree with the above.   *Total Encounter Time as defined by the Centers for  Medicare and Medicaid Services includes, in addition to the face-to-face time of a patient visit (documented in the note above) non-face-to-face time: obtaining and reviewing outside history, ordering and reviewing medications, tests or procedures, care coordination (communications with other health care professionals or caregivers) and documentation in the medical record.

## 2020-02-24 ENCOUNTER — Inpatient Hospital Stay: Payer: Medicare Other

## 2020-02-24 ENCOUNTER — Inpatient Hospital Stay: Payer: Medicare Other | Attending: Oncology | Admitting: Oncology

## 2020-02-24 ENCOUNTER — Other Ambulatory Visit: Payer: Self-pay | Admitting: Family Medicine

## 2020-02-24 ENCOUNTER — Other Ambulatory Visit: Payer: Self-pay

## 2020-02-24 VITALS — BP 144/91 | HR 87 | Temp 98.1°F | Resp 17 | Ht 63.0 in | Wt 132.1 lb

## 2020-02-24 DIAGNOSIS — E785 Hyperlipidemia, unspecified: Secondary | ICD-10-CM | POA: Diagnosis not present

## 2020-02-24 DIAGNOSIS — Z87891 Personal history of nicotine dependence: Secondary | ICD-10-CM | POA: Insufficient documentation

## 2020-02-24 DIAGNOSIS — K219 Gastro-esophageal reflux disease without esophagitis: Secondary | ICD-10-CM | POA: Insufficient documentation

## 2020-02-24 DIAGNOSIS — M8580 Other specified disorders of bone density and structure, unspecified site: Secondary | ICD-10-CM | POA: Insufficient documentation

## 2020-02-24 DIAGNOSIS — Z79810 Long term (current) use of selective estrogen receptor modulators (SERMs): Secondary | ICD-10-CM | POA: Diagnosis not present

## 2020-02-24 DIAGNOSIS — Z79899 Other long term (current) drug therapy: Secondary | ICD-10-CM | POA: Diagnosis not present

## 2020-02-24 DIAGNOSIS — Z923 Personal history of irradiation: Secondary | ICD-10-CM | POA: Insufficient documentation

## 2020-02-24 DIAGNOSIS — Z853 Personal history of malignant neoplasm of breast: Secondary | ICD-10-CM

## 2020-02-24 DIAGNOSIS — I10 Essential (primary) hypertension: Secondary | ICD-10-CM | POA: Insufficient documentation

## 2020-02-24 DIAGNOSIS — Z7982 Long term (current) use of aspirin: Secondary | ICD-10-CM | POA: Insufficient documentation

## 2020-02-24 DIAGNOSIS — Z9221 Personal history of antineoplastic chemotherapy: Secondary | ICD-10-CM | POA: Insufficient documentation

## 2020-02-24 DIAGNOSIS — Z170 Estrogen receptor positive status [ER+]: Secondary | ICD-10-CM | POA: Insufficient documentation

## 2020-02-24 DIAGNOSIS — C50511 Malignant neoplasm of lower-outer quadrant of right female breast: Secondary | ICD-10-CM | POA: Diagnosis not present

## 2020-02-24 LAB — COMPREHENSIVE METABOLIC PANEL
ALT: 25 U/L (ref 0–44)
AST: 27 U/L (ref 15–41)
Albumin: 3.8 g/dL (ref 3.5–5.0)
Alkaline Phosphatase: 55 U/L (ref 38–126)
Anion gap: 5 (ref 5–15)
BUN: 13 mg/dL (ref 8–23)
CO2: 29 mmol/L (ref 22–32)
Calcium: 10.5 mg/dL — ABNORMAL HIGH (ref 8.9–10.3)
Chloride: 107 mmol/L (ref 98–111)
Creatinine, Ser: 0.85 mg/dL (ref 0.44–1.00)
GFR, Estimated: >60 mL/min (ref >60)
Glucose, Bld: 78 mg/dL (ref 70–99)
Potassium: 3.8 mmol/L (ref 3.5–5.1)
Sodium: 141 mmol/L (ref 135–145)
Total Bilirubin: 0.3 mg/dL (ref 0.3–1.2)
Total Protein: 7.7 g/dL (ref 6.5–8.1)

## 2020-02-24 LAB — CBC WITH DIFFERENTIAL/PLATELET
Abs Immature Granulocytes: 0.02 10*3/uL (ref 0.00–0.07)
Basophils Absolute: 0.1 10*3/uL (ref 0.0–0.1)
Basophils Relative: 1 %
Eosinophils Absolute: 0.2 10*3/uL (ref 0.0–0.5)
Eosinophils Relative: 2 %
HCT: 37.6 % (ref 36.0–46.0)
Hemoglobin: 12.9 g/dL (ref 12.0–15.0)
Immature Granulocytes: 0 %
Lymphocytes Relative: 23 %
Lymphs Abs: 2.2 10*3/uL (ref 0.7–4.0)
MCH: 32.3 pg (ref 26.0–34.0)
MCHC: 34.3 g/dL (ref 30.0–36.0)
MCV: 94.2 fL (ref 80.0–100.0)
Monocytes Absolute: 0.9 10*3/uL (ref 0.1–1.0)
Monocytes Relative: 9 %
Neutro Abs: 6.1 10*3/uL (ref 1.7–7.7)
Neutrophils Relative %: 65 %
Platelets: 233 10*3/uL (ref 150–400)
RBC: 3.99 MIL/uL (ref 3.87–5.11)
RDW: 12.4 % (ref 11.5–15.5)
WBC: 9.5 10*3/uL (ref 4.0–10.5)
nRBC: 0 % (ref 0.0–0.2)

## 2020-02-24 NOTE — Telephone Encounter (Signed)
Name of Medication: Xanax Name of Pharmacy: [facility] [address] Last Fill or Written Date and Quantity: 09/30/19 #30 tab 0 refill Last Office Visit and Type: ?UTI 01/07/20 (CPE was on 06/10/19) Next Office Visit and Type: CPE 06/11/20

## 2020-02-26 ENCOUNTER — Telehealth: Payer: Self-pay | Admitting: Oncology

## 2020-02-26 NOTE — Telephone Encounter (Signed)
No 10/11 los, no changes made to pt schedule

## 2020-02-29 ENCOUNTER — Other Ambulatory Visit: Payer: Self-pay

## 2020-02-29 ENCOUNTER — Ambulatory Visit: Payer: Medicare Other | Attending: Internal Medicine

## 2020-02-29 DIAGNOSIS — Z23 Encounter for immunization: Secondary | ICD-10-CM

## 2020-02-29 NOTE — Progress Notes (Signed)
   Covid-19 Vaccination Clinic  Name:  Jamie Dillon    MRN: [id number] DOB: 26-Jul-1950  02/29/2020  Jamie Dillon was observed post Covid-19 immunization for 15 minutes without incident. She was provided with Vaccine Information Sheet and instruction to access the V-Safe system.   Jamie Dillon was instructed to call 911 with any severe reactions post vaccine: Marland Kitchen Difficulty breathing  . Swelling of face and throat  . A fast heartbeat  . A bad rash all over body  . Dizziness and weakness

## 2020-04-15 ENCOUNTER — Other Ambulatory Visit: Payer: Self-pay | Admitting: Oncology

## 2020-05-30 ENCOUNTER — Other Ambulatory Visit: Payer: Self-pay | Admitting: Family Medicine

## 2020-06-02 ENCOUNTER — Telehealth: Payer: Self-pay | Admitting: Family Medicine

## 2020-06-02 DIAGNOSIS — E7800 Pure hypercholesterolemia, unspecified: Secondary | ICD-10-CM

## 2020-06-02 DIAGNOSIS — I10 Essential (primary) hypertension: Secondary | ICD-10-CM

## 2020-06-02 DIAGNOSIS — Z79899 Other long term (current) drug therapy: Secondary | ICD-10-CM | POA: Insufficient documentation

## 2020-06-02 NOTE — Telephone Encounter (Signed)
-----   Message from Ellamae Sia sent at 05/18/2020  2:52 PM EST ----- Regarding: Lab orders, Thursday, 1.20.22 Patient is scheduled for CPX labs, please order future labs, Thanks , Karna Christmas

## 2020-06-04 ENCOUNTER — Other Ambulatory Visit (INDEPENDENT_AMBULATORY_CARE_PROVIDER_SITE_OTHER): Payer: Medicare Other

## 2020-06-04 ENCOUNTER — Other Ambulatory Visit: Payer: Self-pay

## 2020-06-04 DIAGNOSIS — E7800 Pure hypercholesterolemia, unspecified: Secondary | ICD-10-CM

## 2020-06-04 DIAGNOSIS — Z79899 Other long term (current) drug therapy: Secondary | ICD-10-CM | POA: Diagnosis not present

## 2020-06-04 DIAGNOSIS — I10 Essential (primary) hypertension: Secondary | ICD-10-CM | POA: Diagnosis not present

## 2020-06-04 LAB — CBC WITH DIFFERENTIAL/PLATELET
Basophils Absolute: 0.1 10*3/uL (ref 0.0–0.1)
Basophils Relative: 0.9 % (ref 0.0–3.0)
Eosinophils Absolute: 0.3 10*3/uL (ref 0.0–0.7)
Eosinophils Relative: 3.5 % (ref 0.0–5.0)
HCT: 37.8 % (ref 36.0–46.0)
Hemoglobin: 13.2 g/dL (ref 12.0–15.0)
Lymphocytes Relative: 18.5 % (ref 12.0–46.0)
Lymphs Abs: 1.8 10*3/uL (ref 0.7–4.0)
MCHC: 34.8 g/dL (ref 30.0–36.0)
MCV: 94.7 fl (ref 78.0–100.0)
Monocytes Absolute: 0.8 10*3/uL (ref 0.1–1.0)
Monocytes Relative: 8.4 % (ref 3.0–12.0)
Neutro Abs: 6.6 10*3/uL (ref 1.4–7.7)
Neutrophils Relative %: 68.7 % (ref 43.0–77.0)
Platelets: 249 10*3/uL (ref 150.0–400.0)
RBC: 3.99 Mil/uL (ref 3.87–5.11)
RDW: 13 % (ref 11.5–15.5)
WBC: 9.6 10*3/uL (ref 4.0–10.5)

## 2020-06-04 LAB — LIPID PANEL
Cholesterol: 152 mg/dL (ref 0–200)
HDL: 51.3 mg/dL (ref >39.00)
LDL Cholesterol: 77 mg/dL (ref 0–99)
NonHDL: 101
Total CHOL/HDL Ratio: 3
Triglycerides: 120 mg/dL (ref 0.0–149.0)
VLDL: 24 mg/dL (ref 0.0–40.0)

## 2020-06-04 LAB — COMPREHENSIVE METABOLIC PANEL
ALT: 27 U/L (ref 0–35)
AST: 25 U/L (ref 0–37)
Albumin: 4.2 g/dL (ref 3.5–5.2)
Alkaline Phosphatase: 54 U/L (ref 39–117)
BUN: 16 mg/dL (ref 6–23)
CO2: 28 mEq/L (ref 19–32)
Calcium: 9.7 mg/dL (ref 8.4–10.5)
Chloride: 104 mEq/L (ref 96–112)
Creatinine, Ser: 1.01 mg/dL (ref 0.40–1.20)
GFR: 56.12 mL/min — ABNORMAL LOW (ref >60.00)
Glucose, Bld: 91 mg/dL (ref 70–99)
Potassium: 3.9 mEq/L (ref 3.5–5.1)
Sodium: 140 mEq/L (ref 135–145)
Total Bilirubin: 0.5 mg/dL (ref 0.2–1.2)
Total Protein: 7.1 g/dL (ref 6.0–8.3)

## 2020-06-04 LAB — VITAMIN B12: Vitamin B-12: 712 pg/mL (ref 211–911)

## 2020-06-04 LAB — VITAMIN D 25 HYDROXY (VIT D DEFICIENCY, FRACTURES): VITD: 54.15 ng/mL (ref 30.00–100.00)

## 2020-06-04 LAB — TSH: TSH: 2.49 u[IU]/mL (ref 0.35–4.50)

## 2020-06-11 ENCOUNTER — Encounter: Payer: Self-pay | Admitting: Family Medicine

## 2020-06-11 ENCOUNTER — Other Ambulatory Visit: Payer: Self-pay

## 2020-06-11 ENCOUNTER — Ambulatory Visit (INDEPENDENT_AMBULATORY_CARE_PROVIDER_SITE_OTHER): Payer: Medicare Other | Admitting: Family Medicine

## 2020-06-11 VITALS — BP 126/74 | HR 81 | Temp 96.9°F | Ht 60.5 in | Wt 132.5 lb

## 2020-06-11 DIAGNOSIS — I700 Atherosclerosis of aorta: Secondary | ICD-10-CM | POA: Diagnosis not present

## 2020-06-11 DIAGNOSIS — R8290 Unspecified abnormal findings in urine: Secondary | ICD-10-CM | POA: Diagnosis not present

## 2020-06-11 DIAGNOSIS — I10 Essential (primary) hypertension: Secondary | ICD-10-CM | POA: Diagnosis not present

## 2020-06-11 DIAGNOSIS — E7800 Pure hypercholesterolemia, unspecified: Secondary | ICD-10-CM | POA: Diagnosis not present

## 2020-06-11 DIAGNOSIS — Z0000 Encounter for general adult medical examination without abnormal findings: Secondary | ICD-10-CM | POA: Diagnosis not present

## 2020-06-11 DIAGNOSIS — Z79899 Other long term (current) drug therapy: Secondary | ICD-10-CM

## 2020-06-11 DIAGNOSIS — Z853 Personal history of malignant neoplasm of breast: Secondary | ICD-10-CM

## 2020-06-11 DIAGNOSIS — E2839 Other primary ovarian failure: Secondary | ICD-10-CM

## 2020-06-11 LAB — POC URINALSYSI DIPSTICK (AUTOMATED)
Bilirubin, UA: NEGATIVE
Blood, UA: NEGATIVE
Glucose, UA: NEGATIVE
Ketones, UA: NEGATIVE
Leukocytes, UA: NEGATIVE
Nitrite, UA: NEGATIVE
Protein, UA: NEGATIVE
Spec Grav, UA: 1.015 (ref 1.010–1.025)
Urobilinogen, UA: 0.2 E.U./dL
pH, UA: 7 (ref 5.0–8.0)

## 2020-06-11 MED ORDER — OMEPRAZOLE 20 MG PO CPDR: 20.0000 mg | DELAYED_RELEASE_CAPSULE | Freq: Two times a day (BID) | ORAL | 3 refills | Status: DC

## 2020-06-11 MED ORDER — ALPRAZOLAM 0.5 MG PO TABS: 0.5000 mg | ORAL_TABLET | Freq: Every day | ORAL | 0 refills | Status: DC | PRN

## 2020-06-11 MED ORDER — LISINOPRIL-HYDROCHLOROTHIAZIDE 20-25 MG PO TABS: 1.0000 | ORAL_TABLET | Freq: Every day | ORAL | 3 refills | Status: DC

## 2020-06-11 MED ORDER — ATORVASTATIN CALCIUM 10 MG PO TABS: 10.0000 mg | ORAL_TABLET | Freq: Every day | ORAL | 3 refills | Status: DC

## 2020-06-11 MED ORDER — METOPROLOL SUCCINATE ER 50 MG PO TB24: ORAL_TABLET | ORAL | 3 refills | Status: DC

## 2020-06-11 NOTE — Assessment & Plan Note (Signed)
Disc goals for lipids and reasons to control them Rev last labs with pt Rev low sat fat diet in detail Good control with atorvastatin 10 mg daily  LDL of 77

## 2020-06-11 NOTE — Assessment & Plan Note (Signed)
Reviewed health habits including diet and exercise and skin cancer prevention Reviewed appropriate screening tests for age  Also reviewed health mt list, fam hx and immunization status , as well as social and family history   See HPI Labs reviewed  utd breast and colon cancer screening  Discussed shingrix vaccine  dexa 1/19 normal, ref done for 2 y f/u (past tamoxifen inc risk of OP) No falls or fractures and takes ca plus D Advance directive discussed and given materials to update it  No cognitive concerns Nl hearing screen  utd eye/vision care  

## 2020-06-11 NOTE — Assessment & Plan Note (Signed)
Normal vit D and B12 levels

## 2020-06-11 NOTE — Assessment & Plan Note (Signed)
bp in fair control at this time  BP Readings from Last 1 Encounters:  06/11/20 126/74   No changes needed Most recent labs reviewed  Disc lifstyle change with low sodium diet and exercise  Plan to continue lisinopril hct 20-25 mg daily along with metoprolol xl 50 mg daily

## 2020-06-11 NOTE — Progress Notes (Signed)
Subjective:    Patient ID: Jamie Dillon, female    DOB: 1950-06-05, 70 y.o.   MRN: [id number]  This visit occurred during the SARS-CoV-2 public health emergency.  Safety protocols were in place, including screening questions prior to the visit, additional usage of staff PPE, and extensive cleaning of exam room while observing appropriate contact time as indicated for disinfecting solutions.    HPI  Pt presents for amw and health mt exam   I have personally reviewed the Medicare Annual Wellness questionnaire and have noted 1. The patient's medical and social history 2. Their use of alcohol, tobacco or illicit drugs 3. Their current medications and supplements 4. The patient's functional ability including ADL's, fall risks, home safety risks and hearing or visual             impairment. 5. Diet and physical activities 6. Evidence for depression or mood disorders  The patients weight, height, BMI have been recorded in the chart and visual acuity is per eye clinic.  I have made referrals, counseling and provided education to the patient based review of the above and I have provided the pt with a written personalized care plan for preventive services. Reviewed and updated provider list, see scanned forms.  See scanned forms.  Routine anticipatory guidance given to patient.  See health maintenance. Colon cancer screening colonoscopy 12/20 Breast cancer screening  Mammogram 3/21 History of breast cancer -she saw onc in oct and was released (no longer on tamoxifen after 5 years)  Self breast exam-no lumps  Flu vaccine 9/21 Tetanus vaccine 11/12 Tdap  covid status immunized with booster  Pneumovax completed Zoster vaccine- wants shingrix/will get soon  Dexa 1/19 -normal bmd  Falls  - none Fractures-none  Supplements ca and D Exercise -treadmill   Advance directive- has healthcare poa /not living will Given materials to work on this  Cognitive function addressed- see scanned forms-  and if abnormal then additional documentation follows.  occ short term memory is a little shorter- misplaces things  Cognitive fxn fine-does own finances   PMH and SH reviewed  Meds, vitals, and allergies reviewed.   ROS: See HPI.  Otherwise negative.    Weight : Wt Readings from Last 3 Encounters:  06/11/20 132 lb 8 oz (60.1 kg)  02/24/20 132 lb 1.6 oz (59.9 kg)  01/07/20 134 lb (60.8 kg)   25.45 kg/m   Hearing/vision:  Hearing Screening   125Hz  250Hz  500Hz  1000Hz  2000Hz  3000Hz  4000Hz  6000Hz  8000Hz   Right ear:   40 40 40  40    Left ear:   40 40 40  40    Vision Screening Comments: Eye exam in June 2021 at [facility]/Dr. Valetta Close utd vision [place] exam   Care team  Jahniya Duzan-[facility] [place]- oncology  Feels ok overall   HTN bp is stable today  No cp or palpitations or headaches or edema  No side effects to medicines  BP Readings from Last 3 Encounters:  06/11/20 126/74  02/24/20 (!) 144/91  01/07/20 132/76     Pulse Readings from Last 3 Encounters:  06/11/20 81  02/24/20 87  01/07/20 77     Takes lisinopril hct 20-25 mg daily Metoprolol xl 50 mg daily  Hyperlipidemia  Lab Results  Component Value Date   CHOL 152 06/04/2020   CHOL 143 06/03/2019   CHOL 144 05/17/2018   Lab Results  Component Value Date   HDL 51.30 06/04/2020   HDL 41.80 06/03/2019   HDL  43.90 05/17/2018   Lab Results  Component Value Date   LDLCALC 77 06/04/2020   LDLCALC 70 06/03/2019   LDLCALC 73 05/17/2018   Lab Results  Component Value Date   TRIG 120.0 06/04/2020   TRIG 153.0 (H) 06/03/2019   TRIG 135.0 05/17/2018   Lab Results  Component Value Date   CHOLHDL 3 06/04/2020   CHOLHDL 3 06/03/2019   CHOLHDL 3 05/17/2018   Lab Results  Component Value Date   LDLDIRECT 127.3 03/01/2011  atorvastatin 10 mg daily  Eating at home more    Takes omeprazole  (using less lately- prn) GFR 56.1 Vit D level 54 Lab Results  Component Value Date    VITAMINB12 712 06/04/2020   Other labs Lab Results  Component Value Date   CREATININE 1.01 06/04/2020   BUN 16 06/04/2020   NA 140 06/04/2020   K 3.9 06/04/2020   CL 104 06/04/2020   CO2 28 06/04/2020   Lab Results  Component Value Date   ALT 27 06/04/2020   AST 25 06/04/2020   ALKPHOS 54 06/04/2020   BILITOT 0.5 06/04/2020   Lab Results  Component Value Date   WBC 9.6 06/04/2020   HGB 13.2 06/04/2020   HCT 37.8 06/04/2020   MCV 94.7 06/04/2020   PLT 249.0 06/04/2020   Lab Results  Component Value Date   TSH 2.49 06/04/2020    Some uti symptoms  Slight odor  No burning  Did at home test- protein came up high  Pan sensitive uti august   Results for orders placed or performed in visit on 06/11/20  POCT Urinalysis Dipstick (Automated)  Result Value Ref Range   Color, UA Bright Yellow    Clarity, UA Clear    Glucose, UA Negative Negative   Bilirubin, UA Negative    Ketones, UA Negative    Spec Grav, UA 1.015 1.010 - 1.025   Blood, UA Negative    pH, UA 7.0 5.0 - 8.0   Protein, UA Negative Negative   Urobilinogen, UA 0.2 0.2 or 1.0 E.U./dL   Nitrite, UA Negative    Leukocytes, UA Negative Negative     Patient Active Problem List   Diagnosis Date Noted  . Cloudy urine 06/11/2020  . Current use of proton pump inhibitor 06/02/2020  . Gastritis 09/19/2018  . Aortic atherosclerosis ([place]) 06/22/2017  . Sinus tachycardia 09/21/2014  . Anxiety 07/08/2014  . History of breast cancer 06/19/2014  . Encounter for Medicare annual wellness exam 04/25/2014  . Estrogen deficiency 04/25/2014  . Stress reaction 01/03/2014  . Routine general medical examination at a health care facility 02/26/2011  . HYPERCHOLESTEROLEMIA, PURE 02/01/2007  . Essential hypertension 09/08/2006  . ALLERGIC RHINITIS 09/08/2006  . TOBACCO ABUSE, HX OF 09/08/2006   Past Medical History:  Diagnosis Date  . Allergic rhinitis   . Allergy   . Anxiety   . Arthritis    back, right hand, ?  knees   . Breast cancer of lower-outer quadrant of right female breast ([place]) 06/19/2014  . Cataract 03/2018   bilateral eyes  . Disorder of bone and cartilage, unspecified   . Family history of malignant neoplasm of breast   . Family history of other kidney diseases   . GERD (gastroesophageal reflux disease)   . History of chemotherapy 2016  . History of radiation therapy 2016  . HLD (hyperlipidemia)   . HTN (hypertension)   . PONV (postoperative nausea and vomiting)   . Tobacco abuse  Past Surgical History:  Procedure Laterality Date  . ABDOMINAL HYSTERECTOMY    . APPENDECTOMY    . BREAST LUMPECTOMY WITH NEEDLE LOCALIZATION AND AXILLARY SENTINEL LYMPH NODE BX Right 07/21/2014   Procedure: BREAST LUMPECTOMY WITH NEEDLE LOCALIZATION AND AXILLARY SENTINEL LYMPH NODE BIOPSY;  Surgeon: Rolm Bookbinder, MD;  Location: [place];  Service: General;  Laterality: Right;  . COLONOSCOPY    . DILATION AND CURETTAGE OF UTERUS    . PORT-A-CATH REMOVAL N/A 03/12/2015   Procedure: REMOVAL PORT-A-CATH;  Surgeon: Rolm Bookbinder, MD;  Location: [place];  Service: General;  Laterality: N/A;  . PORTACATH PLACEMENT Right 09/10/2014   Procedure: INSERTION PORT-A-CATH;  Surgeon: Rolm Bookbinder, MD;  Location: [place];  Service: General;  Laterality: Right;  . TOTAL VAGINAL HYSTERECTOMY  1996   endometriosis   Social History   Tobacco Use  . Smoking status: Former Smoker    Packs/day: 1.00    Types: Cigarettes    Quit date: 06/25/2010    Years since quitting: 9.9  . Smokeless tobacco: Never Used  Vaping Use  . Vaping Use: Never used  Substance Use Topics  . Alcohol use: Yes    Alcohol/week: 0.0 standard drinks    Comment: rarely wine/ 2x per year  . Drug use: No   Family History  Problem Relation Age of Onset  . Hypertension Mother   . Kidney disease Father   . Breast cancer Sister 39  . Diabetes Paternal Aunt   . Colon cancer Neg Hx   . Colon polyps  Neg Hx   . Esophageal cancer Neg Hx   . Stomach cancer Neg Hx   . Rectal cancer Neg Hx    Allergies  Allergen Reactions  . Dust Mite Extract   . Pollen Extract    Current Outpatient Medications on File Prior to Visit  Medication Sig Dispense Refill  . aspirin 81 MG chewable tablet Chew 81 mg by mouth daily.    Marland Kitchen b complex vitamins tablet Take 1 tablet by mouth daily.    . Calcium Carbonate-Vitamin D (CALCIUM-VITAMIN D) 500-200 MG-UNIT per tablet Take 3 tablets by mouth every morning.    . diphenhydrAMINE (BENADRYL) 25 MG tablet Take 25 mg by mouth daily as needed for allergies.    Mariane Baumgarten Calcium (STOOL SOFTENER PO) Take 2 capsules by mouth daily.    . Glucosamine-Chondroit-Vit C-Mn (GLUCOSAMINE 1500 COMPLEX) CAPS Take by mouth.    . Multiple Vitamin (MULTIVITAMIN) tablet Take 1 tablet by mouth daily.     No current facility-administered medications on file prior to visit.    Review of Systems  Constitutional: Negative for activity change, appetite change, fatigue, fever and unexpected weight change.  HENT: Negative for congestion, ear pain, rhinorrhea, sinus pressure and sore throat.   Eyes: Negative for pain, redness and visual disturbance.  Respiratory: Negative for cough, shortness of breath and wheezing.   Cardiovascular: Negative for chest pain and palpitations.  Gastrointestinal: Negative for abdominal pain, blood in stool, constipation and diarrhea.  Endocrine: Negative for polydipsia and polyuria.  Genitourinary: Negative for dysuria, frequency and urgency.  Musculoskeletal: Negative for arthralgias, back pain and myalgias.  Skin: Negative for pallor and rash.  Allergic/Immunologic: Negative for environmental allergies.  Neurological: Negative for dizziness, syncope and headaches.  Hematological: Negative for adenopathy. Does not bruise/bleed easily.  Psychiatric/Behavioral: Negative for decreased concentration and dysphoric mood. The patient is not nervous/anxious.         Objective:   Physical  Exam Constitutional:      General: She is not in acute distress.    Appearance: Normal appearance. She is well-developed. She is not ill-appearing or diaphoretic.  HENT:     Head: Normocephalic and atraumatic.     Right Ear: Tympanic membrane, ear canal and external ear normal.     Left Ear: Tympanic membrane, ear canal and external ear normal.     Nose: Nose normal. No congestion.     Mouth/Throat:     Mouth: Mucous membranes are moist.     Pharynx: Oropharynx is clear. No posterior oropharyngeal erythema.  Eyes:     General: No scleral icterus.    Extraocular Movements: Extraocular movements intact.     Conjunctiva/sclera: Conjunctivae normal.     Pupils: Pupils are equal, round, and reactive to light.  Neck:     Thyroid: No thyromegaly.     Vascular: No carotid bruit or JVD.  Cardiovascular:     Rate and Rhythm: Normal rate and regular rhythm.     Pulses: Normal pulses.     Heart sounds: Normal heart sounds. No gallop.   Pulmonary:     Effort: Pulmonary effort is normal. No respiratory distress.     Breath sounds: Normal breath sounds. No wheezing.     Comments: Good air exch Chest:     Chest wall: No tenderness.  Abdominal:     General: Bowel sounds are normal. There is no distension or abdominal bruit.     Palpations: Abdomen is soft. There is no mass.     Tenderness: There is no abdominal tenderness.     Hernia: No hernia is present.  Genitourinary:    Comments: Breast exam: No mass, nodules, thickening, tenderness, bulging, retraction, inflamation, nipple discharge or skin changes noted.  No axillary or clavicular LA.     Baseline surgical changes in R breast  Musculoskeletal:        General: No tenderness. Normal range of motion.     Cervical back: Normal range of motion and neck supple. No rigidity. No muscular tenderness.     Right lower leg: No edema.     Left lower leg: No edema.  Lymphadenopathy:     Cervical: No cervical  adenopathy.  Skin:    General: Skin is warm and dry.     Coloration: Skin is not pale.     Findings: No erythema or rash.     Comments: Solar lentigines diffusely   Neurological:     Mental Status: She is alert. Mental status is at baseline.     Cranial Nerves: No cranial nerve deficit.     Motor: No abnormal muscle tone.     Coordination: Coordination normal.     Gait: Gait normal.     Deep Tendon Reflexes: Reflexes are normal and symmetric. Reflexes normal.  Psychiatric:        Mood and Affect: Mood normal.        Cognition and Memory: Cognition and memory normal.           Assessment & Plan:   Problem List Items Addressed This Visit      Cardiovascular and Mediastinum   Essential hypertension    bp in fair control at this time  BP Readings from Last 1 Encounters:  06/11/20 126/74   No changes needed Most recent labs reviewed  Disc lifstyle change with low sodium diet and exercise  Plan to continue lisinopril hct 20-25 mg daily along with metoprolol xl 50 mg  daily      Relevant Medications   atorvastatin (LIPITOR) 10 MG tablet   lisinopril-hydrochlorothiazide (ZESTORETIC) 20-25 MG tablet   metoprolol succinate (TOPROL-XL) 50 MG 24 hr tablet   Aortic atherosclerosis ([facility])    No symptoms  HTN and lipids well controlled      Relevant Medications   atorvastatin (LIPITOR) 10 MG tablet   lisinopril-hydrochlorothiazide (ZESTORETIC) 20-25 MG tablet   metoprolol succinate (TOPROL-XL) 50 MG 24 hr tablet     Other   HYPERCHOLESTEROLEMIA, PURE    Disc goals for lipids and reasons to control them Rev last labs with pt Rev low sat fat diet in detail Good control with atorvastatin 10 mg daily  LDL of 77      Relevant Medications   atorvastatin (LIPITOR) 10 MG tablet   lisinopril-hydrochlorothiazide (ZESTORETIC) 20-25 MG tablet   metoprolol succinate (TOPROL-XL) 50 MG 24 hr tablet   Routine general medical examination at a health care facility    Reviewed health  habits including diet and exercise and skin cancer prevention Reviewed appropriate screening tests for age  Also reviewed health mt list, fam hx and immunization status , as well as social and family history   See HPI Labs reviewed  utd breast and colon cancer screening  Discussed shingrix vaccine  dexa 1/19 normal, ref done for 2 y f/u (past tamoxifen inc risk of OP) No falls or fractures and takes ca plus D Advance directive discussed and given materials to update it  No cognitive concerns Nl hearing screen  utd eye/vision care       Encounter for Medicare annual wellness exam - Primary    Reviewed health habits including diet and exercise and skin cancer prevention Reviewed appropriate screening tests for age  Also reviewed health mt list, fam hx and immunization status , as well as social and family history   See HPI Labs reviewed  utd breast and colon cancer screening  Discussed shingrix vaccine  dexa 1/19 normal, ref done for 2 y f/u (past tamoxifen inc risk of OP) No falls or fractures and takes ca plus D Advance directive discussed and given materials to update it  No cognitive concerns Nl hearing screen  utd eye/vision care       Estrogen deficiency   Relevant Orders   DG Bone Density   History of breast cancer    Oncology signed off  Finished 5 y of tamoxifen      Current use of proton pump inhibitor    Normal vit D and B12 levels       Cloudy urine    Clear ua  inst to drink more water       Relevant Orders   POCT Urinalysis Dipstick (Automated) (Completed)

## 2020-06-11 NOTE — Assessment & Plan Note (Signed)
Reviewed health habits including diet and exercise and skin cancer prevention Reviewed appropriate screening tests for age  Also reviewed health mt list, fam hx and immunization status , as well as social and family history   See HPI Labs reviewed  utd breast and colon cancer screening  Discussed shingrix vaccine  dexa 1/19 normal, ref done for 2 y f/u (past tamoxifen inc risk of OP) No falls or fractures and takes ca plus D Advance directive discussed and given materials to update it  No cognitive concerns Nl hearing screen  utd eye/vision care

## 2020-06-11 NOTE — Assessment & Plan Note (Signed)
Clear ua  inst to drink more water

## 2020-06-11 NOTE — Patient Instructions (Addendum)
Drink lots of water (now and from now on) Let's check a urinalysis today   If you are interested in the new shingles vaccine (Shingrix) - call your local pharmacy to check on coverage and availability  If affordable, get on a wait list at your pharmacy to get the vaccine.  Work on Set designer directive (blue booklet)   Call and schedule your bone density test when you want to

## 2020-06-11 NOTE — Assessment & Plan Note (Signed)
Oncology signed off  Finished 5 y of tamoxifen

## 2020-06-11 NOTE — Assessment & Plan Note (Signed)
No symptoms  HTN and lipids well controlled

## 2020-07-01 ENCOUNTER — Other Ambulatory Visit: Payer: Self-pay

## 2020-07-01 ENCOUNTER — Ambulatory Visit (INDEPENDENT_AMBULATORY_CARE_PROVIDER_SITE_OTHER)
Admission: RE | Admit: 2020-07-01 | Discharge: 2020-07-01 | Disposition: A | Discharge status: Home / Self Care | Payer: Medicare Other | Source: Ambulatory Visit | Attending: Family Medicine | Admitting: Family Medicine

## 2020-07-01 DIAGNOSIS — E2839 Other primary ovarian failure: Secondary | ICD-10-CM | POA: Diagnosis not present

## 2020-08-18 ENCOUNTER — Encounter: Payer: Self-pay | Admitting: Oncology

## 2020-08-24 ENCOUNTER — Telehealth: Payer: Self-pay

## 2020-08-24 MED ORDER — OSELTAMIVIR PHOSPHATE 75 MG PO CAPS: 75.0000 mg | ORAL_CAPSULE | Freq: Every day | ORAL | 0 refills | Status: DC

## 2020-08-24 NOTE — Telephone Encounter (Signed)
Pt notified Rx sent to pharmacy and advise of PCP's comments

## 2020-08-24 NOTE — Telephone Encounter (Signed)
Patient called and stated that her grandson tested positive for flu, but negative for COVID today and patient was exposed to him. Patient stated that they are going out of the country Saturday and was wondering if there is anything preventative that she could take. Patient is currently not exhibiting symptoms.

## 2020-08-24 NOTE — Telephone Encounter (Signed)
I [facility] Take it daily for a week  Alert [place] if she develops symptoms

## 2020-09-21 ENCOUNTER — Other Ambulatory Visit: Payer: Self-pay | Admitting: Family Medicine

## 2020-09-22 NOTE — Telephone Encounter (Signed)
Pharmacy requests refill on: Alprazolam 0.5 mg   LAST REFILL: 06/11/2020 (Q-30, R-0) LAST OV: 06/11/2020 NEXT OV: Not Scheduled  PHARMACY: [id number] [place], [place]

## 2020-10-30 ENCOUNTER — Telehealth: Payer: Self-pay

## 2020-10-30 MED ORDER — MOLNUPIRAVIR EUA 200MG CAPSULE: 4.0000 | ORAL_CAPSULE | Freq: Two times a day (BID) | ORAL | 0 refills | Status: AC

## 2020-10-30 NOTE — Telephone Encounter (Signed)
I called patient to make sure she was aware that medication had been sent in and to give ER instructions.   I told her that Dr. Marliss Coots assistant will be calling to check in on her on Monday.   She thanks [place] for the care.

## 2020-10-30 NOTE — Telephone Encounter (Signed)
Pt called and states that her and her husband have tested positive for Covid this morning. Onset of symptoms started yesterday and only consist of congestion and cough. Her husband, who see's a dr at a different practice, received Molnupiravir. Pt is interested in getting this Rx as well.

## 2020-10-30 NOTE — Telephone Encounter (Signed)
I sent molnupiravir to take for 5 days as directed If severe symptoms or short of breath go to the ER  Please check on her Monday

## 2020-11-02 NOTE — Telephone Encounter (Signed)
Patient called just to check and see how she was doing, she stated that she is doing better than she was before. She stated that if she need anything she would call and let us know.

## 2020-12-09 ENCOUNTER — Other Ambulatory Visit: Payer: Self-pay | Admitting: Family Medicine

## 2020-12-09 NOTE — Telephone Encounter (Signed)
Name of Medication: Xanax Name of Pharmacy: [facility] [place]. Last Fill or Written Date and Quantity: 09/22/20 #30 tabs 0 refills  Last Office Visit and Type: CPE 06/11/20 Next Office Visit and Type: none scheduled  Last Controlled Substance Agreement Date: 04/28/15 Last UDS:04/28/15

## 2021-03-22 ENCOUNTER — Other Ambulatory Visit: Payer: Self-pay | Admitting: Family Medicine

## 2021-03-23 NOTE — Telephone Encounter (Signed)
Last OV- 06/11/2020 Next OV- 06/15/2021 Last Filled- 12/09/2020

## 2021-05-06 ENCOUNTER — Other Ambulatory Visit: Payer: Self-pay

## 2021-05-06 ENCOUNTER — Encounter: Payer: Self-pay | Admitting: Family Medicine

## 2021-05-06 ENCOUNTER — Telehealth (INDEPENDENT_AMBULATORY_CARE_PROVIDER_SITE_OTHER): Payer: Medicare Other | Admitting: Family Medicine

## 2021-05-06 VITALS — Ht 60.5 in | Wt 132.5 lb

## 2021-05-06 DIAGNOSIS — J0100 Acute maxillary sinusitis, unspecified: Secondary | ICD-10-CM

## 2021-05-06 MED ORDER — AMOXICILLIN 500 MG PO CAPS: 1000.0000 mg | ORAL_CAPSULE | Freq: Two times a day (BID) | ORAL | 0 refills | Status: DC

## 2021-05-06 MED ORDER — PREDNISONE 10 MG PO TABS: 10.0000 mg | ORAL_TABLET | Freq: Every day | ORAL | 0 refills | Status: DC

## 2021-05-06 NOTE — Assessment & Plan Note (Signed)
Given > 10 days.Jamie Dillon likely bacterial sinusitis, with possible right sided ear infection.   treat with nasal saline spray, stop flonase and start prednisone taper. Treat with antibiotics.Jamie Dillon amox x 10 days. Return precautions and ER precautions given.

## 2021-05-06 NOTE — Progress Notes (Signed)
VIRTUAL VISIT Due to national recommendations of social distancing due to [place] 19, a virtual visit is felt to be most appropriate for this patient at this time.   I connected with the patient on 05/06/21 at  3:00 PM EST by virtual telehealth platform and verified that I am speaking with the correct person using two identifiers.   I discussed the limitations, risks, security and privacy concerns of performing an evaluation and management service by  virtual telehealth platform and the availability of in person appointments. I also discussed with the patient that there may be a patient responsible charge related to this service. The patient expressed understanding and agreed to proceed.  Patient location: Home Provider Location: [facility] [place] Participants: Jamie Dillon and Jamie Dillon   Chief Complaint  Patient presents with   Facial Pressure    Pain behind eyes and across nasal area-Started a week ago   Sinus Drainage    History of Present Illness:  70 year old female patient of Dr. Marliss Coots with history of tobacco abuse and HTN presents with new onset sinus congestion and facial pain.   Date of onset:  2 week ago  She reports her symptoms started with  nasal congestion.  No fever.  Progressed to eye pressure and facial pressure.  Darker  yellow mucus.  Bilateral eyes swelling and watery.  Yesterday chills, but no fever.  Pain in right ear.   No myalgia.   Has been using 2 sprays per nostril daily.  Using allegra and occ benadryl a t night.   NO SOB, no wheeze   COVID 19 screen COVID testing:  negative 2 weeks ago COVID vaccine:  Covid-19 Vaccine 08/17/2020  [place] COVID-19 Vaccine 02/29/2020 , 06/28/2019 , 06/07/2019  Pfizer Covid-19 Vaccine Bivalent Booster 02/10/2021   COVID exposure: No recent travel or known exposure to COVID19/flu  The importance of social distancing was discussed today.    Review of Systems  Constitutional:  Negative for chills and fever.   HENT:  Positive for congestion, ear pain and sinus pain. Negative for sore throat.   Eyes:  Negative for pain and redness.  Respiratory:  Negative for cough and shortness of breath.   Cardiovascular:  Negative for chest pain, palpitations and leg swelling.  Gastrointestinal:  Negative for abdominal pain, blood in stool, constipation, diarrhea, nausea and vomiting.  Genitourinary:  Negative for dysuria.  Musculoskeletal:  Negative for falls and myalgias.  Skin:  Negative for rash.  Neurological:  Negative for dizziness.  Psychiatric/Behavioral:  Negative for depression. The patient is not nervous/anxious.      Past Medical History:  Diagnosis Date   Allergic rhinitis    Allergy    Anxiety    Arthritis    back, right hand, ? knees    Breast cancer of lower-outer quadrant of right female breast ([place]) 06/19/2014   Cataract 03/2018   bilateral eyes   Disorder of bone and cartilage, unspecified    Family history of malignant neoplasm of breast    Family history of other kidney diseases    GERD (gastroesophageal reflux disease)    History of chemotherapy 2016   History of radiation therapy 2016   HLD (hyperlipidemia)    HTN (hypertension)    PONV (postoperative nausea and vomiting)    Tobacco abuse     reports that she quit smoking about 10 years ago. Her smoking use included cigarettes. She smoked an average of 1 pack per day. She has never used smokeless tobacco.  She reports current alcohol use. She reports that she does not use drugs.   Current Outpatient Medications:    ALPRAZolam (XANAX) 0.5 MG tablet, TAKE 1 TABLET BY MOUTH EVERY DAY AS NEEDED, Disp: 30 tablet, Rfl: 0   aspirin 81 MG chewable tablet, Chew 81 mg by mouth daily., Disp: , Rfl:    atorvastatin (LIPITOR) 10 MG tablet, Take 1 tablet (10 mg total) by mouth daily., Disp: 90 tablet, Rfl: 3   b complex vitamins tablet, Take 1 tablet by mouth daily., Disp: , Rfl:    Calcium Carbonate-Vitamin D (CALCIUM-VITAMIN D) 500-200  MG-UNIT per tablet, Take 3 tablets by mouth every morning., Disp: , Rfl:    diphenhydrAMINE (BENADRYL) 25 MG tablet, Take 25 mg by mouth daily as needed for allergies., Disp: , Rfl:    Docusate Calcium (STOOL SOFTENER PO), Take 2 capsules by mouth daily., Disp: , Rfl:    Glucosamine-Chondroit-Vit C-Mn (GLUCOSAMINE 1500 COMPLEX) CAPS, Take by mouth., Disp: , Rfl:    lisinopril-hydrochlorothiazide (ZESTORETIC) 20-25 MG tablet, Take 1 tablet by mouth daily., Disp: 90 tablet, Rfl: 3   metoprolol succinate (TOPROL-XL) 50 MG 24 hr tablet, TAKE 1 TABLET EVERY DAY WITH OR IMMEDIATELY FOLLOWING A MEAL, Disp: 90 tablet, Rfl: 3   Multiple Vitamin (MULTIVITAMIN) tablet, Take 1 tablet by mouth daily., Disp: , Rfl:    omeprazole (PRILOSEC) 20 MG capsule, Take 1 capsule (20 mg total) by mouth 2 (two) times daily before a meal., Disp: 180 capsule, Rfl: 3   Observations/Objective: Height 5' 0.5" (1.537 m), weight 132 lb 8 oz (60.1 kg).  Physical Exam  Physical Exam Constitutional:      General: The patient is not in acute distress. Pulmonary:     Effort: Pulmonary effort is normal. No respiratory distress.  Neurological:     Mental Status: The patient is alert and oriented to person, place, and time.  Psychiatric:        Mood and Affect: Mood normal.        Behavior: Behavior normal.   Assessment and Plan Problem List Items Addressed This Visit     Acute non-recurrent maxillary sinusitis - Primary    Given > 10 days.Marland Kitchen likely bacterial sinusitis, with possible right sided ear infection.   treat with nasal saline spray, stop flonase and start prednisone taper. Treat with antibiotics.Marland Kitchen amox x 10 days. Return precautions and ER precautions given.      Relevant Medications   amoxicillin (AMOXIL) 500 MG capsule   predniSONE (DELTASONE) 10 MG tablet    Meds ordered this encounter  Medications   amoxicillin (AMOXIL) 500 MG capsule    Sig: Take 2 capsules (1,000 mg total) by mouth 2 (two) times  daily.    Dispense:  40 capsule    Refill:  0   predniSONE (DELTASONE) 10 MG tablet    Sig: Take 1 tablet (10 mg total) by mouth daily with breakfast.    Dispense:  15 tablet    Refill:  0      I discussed the assessment and treatment plan with the patient. The patient was provided an opportunity to ask questions and all were answered. The patient agreed with the plan and demonstrated an understanding of the instructions.   The patient was advised to call back or seek an in-person evaluation if the symptoms worsen or if the condition fails to improve as anticipated.     Jamie Lofts, MD

## 2021-06-07 ENCOUNTER — Telehealth: Payer: Self-pay | Admitting: Family Medicine

## 2021-06-07 DIAGNOSIS — E7800 Pure hypercholesterolemia, unspecified: Secondary | ICD-10-CM

## 2021-06-07 DIAGNOSIS — I10 Essential (primary) hypertension: Secondary | ICD-10-CM

## 2021-06-07 DIAGNOSIS — Z79899 Other long term (current) drug therapy: Secondary | ICD-10-CM

## 2021-06-07 NOTE — Telephone Encounter (Signed)
-----   Message from Ellamae Sia sent at 05/24/2021  8:11 AM EST ----- Regarding: Lab orders for Tuesday, 1.24.23 Patient is scheduled for CPX labs, please order future labs, Thanks , Karna Christmas

## 2021-06-08 ENCOUNTER — Other Ambulatory Visit: Payer: Medicare Other

## 2021-06-15 ENCOUNTER — Other Ambulatory Visit: Payer: Self-pay

## 2021-06-15 ENCOUNTER — Encounter: Payer: Self-pay | Admitting: Family Medicine

## 2021-06-15 ENCOUNTER — Ambulatory Visit (INDEPENDENT_AMBULATORY_CARE_PROVIDER_SITE_OTHER): Payer: Medicare Other | Admitting: Family Medicine

## 2021-06-15 VITALS — BP 122/74 | HR 77 | Temp 97.4°F | Ht 60.75 in | Wt 138.0 lb

## 2021-06-15 DIAGNOSIS — I700 Atherosclerosis of aorta: Secondary | ICD-10-CM

## 2021-06-15 DIAGNOSIS — Z79899 Other long term (current) drug therapy: Secondary | ICD-10-CM

## 2021-06-15 DIAGNOSIS — F419 Anxiety disorder, unspecified: Secondary | ICD-10-CM

## 2021-06-15 DIAGNOSIS — F430 Acute stress reaction: Secondary | ICD-10-CM

## 2021-06-15 DIAGNOSIS — I10 Essential (primary) hypertension: Secondary | ICD-10-CM

## 2021-06-15 DIAGNOSIS — E7800 Pure hypercholesterolemia, unspecified: Secondary | ICD-10-CM

## 2021-06-15 DIAGNOSIS — Z0000 Encounter for general adult medical examination without abnormal findings: Secondary | ICD-10-CM | POA: Diagnosis not present

## 2021-06-15 LAB — LIPID PANEL
Cholesterol: 181 mg/dL (ref 0–200)
HDL: 45 mg/dL (ref >39.00)
LDL Cholesterol: 97 mg/dL (ref 0–99)
NonHDL: 135.76
Total CHOL/HDL Ratio: 4
Triglycerides: 193 mg/dL — ABNORMAL HIGH (ref 0.0–149.0)
VLDL: 38.6 mg/dL (ref 0.0–40.0)

## 2021-06-15 LAB — CBC WITH DIFFERENTIAL/PLATELET
Basophils Absolute: 0.1 10*3/uL (ref 0.0–0.1)
Basophils Relative: 1.1 % (ref 0.0–3.0)
Eosinophils Absolute: 0.1 10*3/uL (ref 0.0–0.7)
Eosinophils Relative: 1.6 % (ref 0.0–5.0)
HCT: 40 % (ref 36.0–46.0)
Hemoglobin: 13.7 g/dL (ref 12.0–15.0)
Lymphocytes Relative: 18 % (ref 12.0–46.0)
Lymphs Abs: 1.5 10*3/uL (ref 0.7–4.0)
MCHC: 34.2 g/dL (ref 30.0–36.0)
MCV: 94.7 fl (ref 78.0–100.0)
Monocytes Absolute: 0.7 10*3/uL (ref 0.1–1.0)
Monocytes Relative: 8.7 % (ref 3.0–12.0)
Neutro Abs: 5.8 10*3/uL (ref 1.4–7.7)
Neutrophils Relative %: 70.6 % (ref 43.0–77.0)
Platelets: 286 10*3/uL (ref 150.0–400.0)
RBC: 4.23 Mil/uL (ref 3.87–5.11)
RDW: 13.3 % (ref 11.5–15.5)
WBC: 8.2 10*3/uL (ref 4.0–10.5)

## 2021-06-15 LAB — COMPREHENSIVE METABOLIC PANEL
ALT: 20 U/L (ref 0–35)
AST: 23 U/L (ref 0–37)
Albumin: 4.4 g/dL (ref 3.5–5.2)
Alkaline Phosphatase: 58 U/L (ref 39–117)
BUN: 18 mg/dL (ref 6–23)
CO2: 31 mEq/L (ref 19–32)
Calcium: 10 mg/dL (ref 8.4–10.5)
Chloride: 103 mEq/L (ref 96–112)
Creatinine, Ser: 0.89 mg/dL (ref 0.40–1.20)
GFR: 64.85 mL/min (ref >60.00)
Glucose, Bld: 91 mg/dL (ref 70–99)
Potassium: 3.9 mEq/L (ref 3.5–5.1)
Sodium: 142 mEq/L (ref 135–145)
Total Bilirubin: 0.6 mg/dL (ref 0.2–1.2)
Total Protein: 7.2 g/dL (ref 6.0–8.3)

## 2021-06-15 LAB — VITAMIN B12: Vitamin B-12: 1322 pg/mL — ABNORMAL HIGH (ref 211–911)

## 2021-06-15 LAB — TSH: TSH: 2.3 u[IU]/mL (ref 0.35–5.50)

## 2021-06-15 MED ORDER — LISINOPRIL-HYDROCHLOROTHIAZIDE 20-25 MG PO TABS: 1.0000 | ORAL_TABLET | Freq: Every day | ORAL | 3 refills | Status: DC

## 2021-06-15 MED ORDER — ALPRAZOLAM 0.5 MG PO TABS: 0.5000 mg | ORAL_TABLET | Freq: Every day | ORAL | 0 refills | Status: DC | PRN

## 2021-06-15 MED ORDER — METOPROLOL SUCCINATE ER 50 MG PO TB24: ORAL_TABLET | ORAL | 3 refills | Status: DC

## 2021-06-15 MED ORDER — ATORVASTATIN CALCIUM 10 MG PO TABS
10.0000 mg | ORAL_TABLET | Freq: Every day | ORAL | 3 refills | Status: DC
Start: 2021-06-15 — End: 2022-06-14

## 2021-06-15 NOTE — Patient Instructions (Addendum)
Keep exercising  When weather is good-get outdoors  Meditation is helpful -- Calm and Headspace are very good apps to try  Yoga is also  The [employer] - very helpful for learning about mindfulness and meditation Keeping a journal is helpful also    Another book about cognition and memory/health -is great  Keep Hervey Ard -- by [place] it out    If you want to see a cardiologist to discuss cardiac/vascular risk factors in the future please let us know   Labs today

## 2021-06-15 NOTE — Assessment & Plan Note (Signed)
Good bp and cholesterol control No symptoms

## 2021-06-15 NOTE — Assessment & Plan Note (Signed)
More anxious lately  Declines counseling Reviewed stressors/ coping techniques/symptoms/ support sources/ tx options and side effects in detail today  Recommended meditation/mindfulness resources and exercise and self care Takes xanax infrequently  F/u if no imp

## 2021-06-15 NOTE — Progress Notes (Signed)
Subjective:    Patient ID: Jamie Dillon, female    DOB: 25-May-1950, 71 y.o.   MRN: [id number]  This visit occurred during the SARS-CoV-2 public health emergency.  Safety protocols were in place, including screening questions prior to the visit, additional usage of staff PPE, and extensive cleaning of exam room while observing appropriate contact time as indicated for disinfecting solutions.   HPI Pt presents for amw and health mt visit   I have personally reviewed the Medicare Annual Wellness questionnaire and have noted 1. The patient's medical and social history 2. Their use of alcohol, tobacco or illicit drugs 3. Their current medications and supplements 4. The patient's functional ability including ADL's, fall risks, home safety risks and hearing or visual             impairment. 5. Diet and physical activities 6. Evidence for depression or mood disorders  The patients weight, height, BMI have been recorded in the chart and visual acuity is per eye clinic.  I have made referrals, counseling and provided education to the patient based review of the above and I have provided the pt with a written personalized care plan for preventive services. Reviewed and updated provider list, see scanned forms.  See scanned forms.  Routine anticipatory guidance given to patient.  See health maintenance. Colon cancer screening  colonoscopy 04/2019 Breast cancer screening  mammogram 07/2020 Personal h/o breast cancer with 5 y of tamoxifen Self breast exam: no lumps  Flu vaccine 01/2021 Covid vaccinated  Tetanus vaccine 2012, postponed for ins Pneumovax up to date Zoster vaccine: would consider if covered Dexa 06/2020  bmd in the nl range Falls: none Fractures:none  Supplements: ca and D Exercise : walking/treadmill    Advance directive: up to date :  Cognitive function addressed- see scanned forms- and if abnormal then additional documentation follows.   More anxious lately  Sometimes  memory is not as quick   PMH and SH reviewed  Meds, vitals, and allergies reviewed.   ROS: See HPI.  Otherwise negative.    Weight : Wt Readings from Last 3 Encounters:  06/15/21 138 lb (62.6 kg)  05/06/21 132 lb 8 oz (60.1 kg)  06/11/20 132 lb 8 oz (60.1 kg)   26.29 kg/m   Has been going through a little depression  Anxiety worse also  Thinks it is the time of year  Some stress - ups and downs Was on prednisone last month   Started back exercising for the past 2 weeks  When she is stressed she eats  Did some counseling years ago and did not help   Occ feels like she skips a beat when she is tense    Hearing/vision: Hearing Screening   500Hz  1000Hz  2000Hz  4000Hz   Right ear 40 40 40 40  Left ear 40 40 40 40  Vision Screening - Comments:: Exam Nov 2022 at [facility]/Dr. Bowen   PHQ: Depression screen [facility] 2/9 06/15/2021 06/11/2020 05/20/2019 05/17/2018 06/15/2016  Decreased Interest 0 0 0 0 0  Down, Depressed, Hopeless 0 0 0 0 0  PHQ - 2 Score 0 0 0 0 0  Altered sleeping - - 0 0 -  Tired, decreased energy - - 0 0 -  Change in appetite - - 0 0 -  Feeling bad or failure about yourself  - - 0 0 -  Trouble concentrating - - 0 0 -  Moving slowly or fidgety/restless - - 0 0 -  Suicidal thoughts - -  0 0 -  PHQ-9 Score - - 0 0 -  Difficult doing work/chores - - Not difficult at all Not difficult at all -  Some recent data might be hidden     ADLs: no help needed   Functionality: excellent   Care team : [facility]- onc   HTN bp is stable today  No cp or palpitations or headaches or edema  No side effects to medicines  BP Readings from Last 3 Encounters:  06/15/21 122/74  06/11/20 126/74  02/24/20 (!) 144/91    Lisinopril hct 20-25 mg daily  Metoprolol xl 50 mg daily   Pulse Readings from Last 3 Encounters:  06/15/21 77  06/11/20 81  02/24/20 87    Hyperlipidemia  Lab Results  Component Value Date   CHOL 152  06/04/2020   HDL 51.30 06/04/2020   LDLCALC 77 06/04/2020   LDLDIRECT 127.3 03/01/2011   TRIG 120.0 06/04/2020   CHOLHDL 3 06/04/2020   Atorvastatin 10 mg daily  She may be interested in discussing her cardiac risk factors with cardiology  She is eating better since her husband had heart surgery  Seldom cheats   No longer takes omeprazole  Lab Results  Component Value Date   [id number] 371 06/04/2020   Patient Active Problem List   Diagnosis Date Noted   Current use of proton pump inhibitor 06/02/2020   Aortic atherosclerosis ([place]) 06/22/2017   Sinus tachycardia 09/21/2014   Anxiety 07/08/2014   History of breast cancer 06/19/2014   Encounter for Medicare annual wellness exam 04/25/2014   Estrogen deficiency 04/25/2014   Stress reaction 01/03/2014   Routine general medical examination at a health care facility 02/26/2011   HYPERCHOLESTEROLEMIA, PURE 02/01/2007   Essential hypertension 09/08/2006   ALLERGIC RHINITIS 09/08/2006   TOBACCO ABUSE, HX OF 09/08/2006   Past Medical History:  Diagnosis Date   Allergic rhinitis    Allergy    Anxiety    Arthritis    back, right hand, ? knees    Breast cancer of lower-outer quadrant of right female breast ([place]) 06/19/2014   Cataract 03/2018   bilateral eyes   Disorder of bone and cartilage, unspecified    Family history of malignant neoplasm of breast    Family history of other kidney diseases    GERD (gastroesophageal reflux disease)    History of chemotherapy 2016   History of radiation therapy 2016   HLD (hyperlipidemia)    HTN (hypertension)    PONV (postoperative nausea and vomiting)    Tobacco abuse    Past Surgical History:  Procedure Laterality Date   ABDOMINAL HYSTERECTOMY     APPENDECTOMY     BREAST LUMPECTOMY WITH NEEDLE LOCALIZATION AND AXILLARY SENTINEL LYMPH NODE BX Right 07/21/2014   Procedure: BREAST LUMPECTOMY WITH NEEDLE LOCALIZATION AND AXILLARY SENTINEL LYMPH NODE BIOPSY;  Surgeon: Rolm Bookbinder,  MD;  Location: [facility];  Service: General;  Laterality: Right;   COLONOSCOPY     DILATION AND CURETTAGE OF UTERUS     PORT-A-CATH REMOVAL N/A 03/12/2015   Procedure: REMOVAL PORT-A-CATH;  Surgeon: Rolm Bookbinder, MD;  Location: [facility];  Service: General;  Laterality: N/A;   PORTACATH PLACEMENT Right 09/10/2014   Procedure: INSERTION PORT-A-CATH;  Surgeon: Rolm Bookbinder, MD;  Location: [facility];  Service: General;  Laterality: Right;   TOTAL VAGINAL HYSTERECTOMY  1996   endometriosis   Social History   Tobacco Use   Smoking status: Former  Packs/day: 1.00    Types: Cigarettes    Quit date: 06/25/2010    Years since quitting: 10.9   Smokeless tobacco: Never  Vaping Use   Vaping Use: Never used  Substance Use Topics   Alcohol use: Yes    Alcohol/week: 0.0 standard drinks    Comment: rarely wine/ 2x per year   Drug use: No   Family History  Problem Relation Age of Onset   Hypertension Mother    Kidney disease Father    Breast cancer Sister 28   Diabetes Paternal Aunt    Colon cancer Neg Hx    Colon polyps Neg Hx    Esophageal cancer Neg Hx    Stomach cancer Neg Hx    Rectal cancer Neg Hx    Allergies  Allergen Reactions   Dust Mite Extract    Pollen Extract    Current Outpatient Medications on File Prior to Visit  Medication Sig Dispense Refill   aspirin 81 MG chewable tablet Chew 81 mg by mouth daily.     b complex vitamins tablet Take 1 tablet by mouth daily.     Calcium Carbonate-Vitamin D (CALCIUM-VITAMIN D) 500-200 MG-UNIT per tablet Take 3 tablets by mouth every morning.     diphenhydrAMINE (BENADRYL) 25 MG tablet Take 25 mg by mouth daily as needed for allergies.     Docusate Calcium (STOOL SOFTENER PO) Take 2 capsules by mouth daily.     Glucosamine-Chondroit-Vit C-Mn (GLUCOSAMINE 1500 COMPLEX) CAPS Take by mouth.     Multiple Vitamin (MULTIVITAMIN) tablet Take 1 tablet by mouth daily.     No current  facility-administered medications on file prior to visit.     Review of Systems  Constitutional:  Negative for activity change, appetite change, fatigue, fever and unexpected weight change.  HENT:  Negative for congestion, ear pain, rhinorrhea, sinus pressure and sore throat.   Eyes:  Negative for pain, redness and visual disturbance.  Respiratory:  Negative for cough, shortness of breath and wheezing.   Cardiovascular:  Negative for chest pain and palpitations.  Gastrointestinal:  Negative for abdominal pain, blood in stool, constipation and diarrhea.  Endocrine: Negative for polydipsia and polyuria.  Genitourinary:  Negative for dysuria, frequency and urgency.  Musculoskeletal:  Negative for arthralgias, back pain and myalgias.  Skin:  Negative for pallor and rash.  Allergic/Immunologic: Negative for environmental allergies.  Neurological:  Negative for dizziness, syncope and headaches.  Hematological:  Negative for adenopathy. Does not bruise/bleed easily.  Psychiatric/Behavioral:  Positive for dysphoric mood and sleep disturbance. Negative for decreased concentration and suicidal ideas. The patient is nervous/anxious.       Objective:   Physical Exam Constitutional:      General: She is not in acute distress.    Appearance: Normal appearance. She is well-developed and normal weight. She is not ill-appearing or diaphoretic.  HENT:     Head: Normocephalic and atraumatic.     Right Ear: Tympanic membrane, ear canal and external ear normal.     Left Ear: Tympanic membrane, ear canal and external ear normal.     Nose: Nose normal. No congestion.     Mouth/Throat:     Mouth: Mucous membranes are moist.     Pharynx: Oropharynx is clear. No posterior oropharyngeal erythema.  Eyes:     General: No scleral icterus.    Extraocular Movements: Extraocular movements intact.     Conjunctiva/sclera: Conjunctivae normal.     Pupils: Pupils are equal, round, and reactive to light.  Neck:      Thyroid: No thyromegaly.     Vascular: No carotid bruit or JVD.  Cardiovascular:     Rate and Rhythm: Normal rate and regular rhythm.     Pulses: Normal pulses.     Heart sounds: Normal heart sounds.    No gallop.  Pulmonary:     Effort: Pulmonary effort is normal. No respiratory distress.     Breath sounds: Normal breath sounds. No wheezing.     Comments: Good air exch Chest:     Chest wall: No tenderness.  Abdominal:     General: Bowel sounds are normal. There is no distension or abdominal bruit.     Palpations: Abdomen is soft. There is no mass.     Tenderness: There is no abdominal tenderness.     Hernia: No hernia is present.  Genitourinary:    Comments: Breast exam: No mass, nodules, thickening, tenderness, bulging, retraction, inflamation, nipple discharge or skin changes noted.  No axillary or clavicular LA.     Musculoskeletal:        General: No tenderness. Normal range of motion.     Cervical back: Normal range of motion and neck supple. No rigidity. No muscular tenderness.     Right lower leg: No edema.     Left lower leg: No edema.     Comments: No kyphosis   Lymphadenopathy:     Cervical: No cervical adenopathy.  Skin:    General: Skin is warm and dry.     Coloration: Skin is not pale.     Findings: No erythema or rash.     Comments: Solar lentigines diffusely   Neurological:     Mental Status: She is alert. Mental status is at baseline.     Cranial Nerves: No cranial nerve deficit.     Motor: No abnormal muscle tone.     Coordination: Coordination normal.     Gait: Gait normal.     Deep Tendon Reflexes: Reflexes are normal and symmetric. Reflexes normal.  Psychiatric:        Attention and Perception: Attention normal.        Mood and Affect: Mood is anxious.     Comments: Mildly anxious today           Assessment & Plan:   Problem List Items Addressed This Visit       Cardiovascular and Mediastinum   Aortic atherosclerosis ([place])    Good bp and  cholesterol control No symptoms       Relevant Medications   metoprolol succinate (TOPROL-XL) 50 MG 24 hr tablet   lisinopril-hydrochlorothiazide (ZESTORETIC) 20-25 MG tablet   atorvastatin (LIPITOR) 10 MG tablet   Essential hypertension    bp in fair control at this time  BP Readings from Last 1 Encounters:  06/15/21 122/74  No changes needed Most recent labs reviewed  Disc lifstyle change with low sodium diet and exercise  Plan to continue  Lisinopril hct 20-25 mg daily  Metoprolol xl 50 mg daily  Labs ordered      Relevant Medications   metoprolol succinate (TOPROL-XL) 50 MG 24 hr tablet   lisinopril-hydrochlorothiazide (ZESTORETIC) 20-25 MG tablet   atorvastatin (LIPITOR) 10 MG tablet     Other   Anxiety    Worse lately  Disc options for tx and self care      Relevant Medications   ALPRAZolam (XANAX) 0.5 MG tablet   Current use of proton pump inhibitor    Stopped this  recently      Encounter for Medicare annual wellness exam - Primary    Reviewed health habits including diet and exercise and skin cancer prevention Reviewed appropriate screening tests for age  Also reviewed health mt list, fam hx and immunization status , as well as social and family history   See HPI Labs ordered  Tetanus shot posponed for ins  Would consider shingrix vaccine if covered dexa nl utd/no falls or fx and takes ca and D Good exercise Adv directive is utd No cognitive concerns Nl hearing screen utd vision/eye care Good functionality, no help needed with ADLs Discussed mood, pt declines counseling      HYPERCHOLESTEROLEMIA, PURE    Disc goals for lipids and reasons to control them Rev last labs with pt Rev low sat fat diet in detail Labs ordered  Takes atorvastatin 10 mg daily and tolerates it well Diet has been better lately      Relevant Medications   metoprolol succinate (TOPROL-XL) 50 MG 24 hr tablet   lisinopril-hydrochlorothiazide (ZESTORETIC) 20-25 MG tablet    atorvastatin (LIPITOR) 10 MG tablet   Routine general medical examination at a health care facility    Reviewed health habits including diet and exercise and skin cancer prevention Reviewed appropriate screening tests for age  Also reviewed health mt list, fam hx and immunization status , as well as social and family history   See HPI Labs ordered  Tetanus shot posponed for ins  Would consider shingrix vaccine if covered dexa nl utd/no falls or fx and takes ca and D Good exercise Adv directive is utd No cognitive concerns Nl hearing screen utd vision/eye care Good functionality, no help needed with ADLs Discussed mood, pt declines counseling      Stress reaction    More anxious lately  Declines counseling Reviewed stressors/ coping techniques/symptoms/ support sources/ tx options and side effects in detail today  Recommended meditation/mindfulness resources and exercise and self care Takes xanax infrequently  F/u if no imp      Relevant Medications   ALPRAZolam (XANAX) 0.5 MG tablet

## 2021-06-15 NOTE — Assessment & Plan Note (Signed)
Reviewed health habits including diet and exercise and skin cancer prevention Reviewed appropriate screening tests for age  Also reviewed health mt list, fam hx and immunization status , as well as social and family history   See HPI Labs ordered  Tetanus shot posponed for ins  Would consider shingrix vaccine if covered dexa nl utd/no falls or fx and takes ca and D Good exercise Adv directive is utd No cognitive concerns Nl hearing screen utd vision/eye care Good functionality, no help needed with ADLs Discussed mood, pt declines counseling

## 2021-06-15 NOTE — Assessment & Plan Note (Signed)
Worse lately  Disc options for tx and self care

## 2021-06-15 NOTE — Assessment & Plan Note (Signed)
Stopped this recently

## 2021-06-15 NOTE — Assessment & Plan Note (Signed)
Disc goals for lipids and reasons to control them Rev last labs with pt Rev low sat fat diet in detail Labs ordered  Takes atorvastatin 10 mg daily and tolerates it well Diet has been better lately

## 2021-06-15 NOTE — Assessment & Plan Note (Signed)
bp in fair control at this time  BP Readings from Last 1 Encounters:  06/15/21 122/74   No changes needed Most recent labs reviewed  Disc lifstyle change with low sodium diet and exercise  Plan to continue  Lisinopril hct 20-25 mg daily  Metoprolol xl 50 mg daily  Labs ordered

## 2021-08-18 LAB — HM MAMMOGRAPHY

## 2021-09-07 ENCOUNTER — Other Ambulatory Visit: Payer: Self-pay | Admitting: Family Medicine

## 2021-09-07 NOTE — Telephone Encounter (Signed)
Name of Medication: Xanax  ?Name of Pharmacy: [facility]/ [place] ?Last Fill or Written Date and Quantity: 06/15/21 #30 tabs/ 0 refills ?Last Office Visit and Type: 06/15/21 CPE ?Next Office Visit and Type: none scheduled  ? ?  ?

## 2021-09-22 ENCOUNTER — Encounter: Payer: Self-pay | Admitting: Family Medicine

## 2021-12-15 ENCOUNTER — Other Ambulatory Visit: Payer: Self-pay | Admitting: Family Medicine

## 2021-12-15 NOTE — Telephone Encounter (Signed)
Refill request for ALPRAZolam Duanne Moron) 0.5 MG tablet  LOV - 06/15/21 Next OV - not scheduled Last refill - 09/07/21 #30/0

## 2022-03-07 ENCOUNTER — Other Ambulatory Visit: Payer: Self-pay | Admitting: Family Medicine

## 2022-03-08 NOTE — Telephone Encounter (Signed)
Name of Medication: Xananx Name of Pharmacy: [facility] or Written Date and Quantity: 12/15/21 #30 tab/ 0 refills Last Office Visit and Type: CPE on 06/15/21 Next Office Visit and Type: CPE on 06/16/22 :

## 2022-03-30 ENCOUNTER — Telehealth (INDEPENDENT_AMBULATORY_CARE_PROVIDER_SITE_OTHER): Payer: Medicare Other | Admitting: Family Medicine

## 2022-03-30 ENCOUNTER — Encounter: Payer: Self-pay | Admitting: Family Medicine

## 2022-03-30 VITALS — Temp 98.6°F | Ht 60.75 in | Wt 133.0 lb

## 2022-03-30 DIAGNOSIS — U071 COVID-19: Secondary | ICD-10-CM

## 2022-03-30 MED ORDER — NIRMATRELVIR/RITONAVIR (PAXLOVID)TABLET: 3.0000 | ORAL_TABLET | Freq: Two times a day (BID) | ORAL | 0 refills | Status: AC

## 2022-03-30 NOTE — Assessment & Plan Note (Signed)
Fairly mild symptoms started yesterday.  Husband is home with it as well  Disc symptom care  Px paxlovid and disc poss side eff  Rev ER precautions   Update if not starting to improve in a week or if worsening    Will isolate min of 5 d or until symptoms are better then mask for 10 more days

## 2022-03-30 NOTE — Patient Instructions (Addendum)
Drink fluids and rest  mucinex DM is good for cough and congestion  Nasal saline for congestion as needed  Tylenol or ibuprofen for fever or pain or headache  Please alert [place] if symptoms worsen (if severe or short of breath please go to the ER)   Take the paxlovid as directed, hold it if side effects and let us know  Isolate 5 days minimum or until symptoms improve Then mask for an additional 10 days   Update if not starting to improve in a week or if worsening

## 2022-03-30 NOTE — Progress Notes (Signed)
Virtual Visit via Video Note  I connected with Jamie Dillon on 03/30/22 at 12:30 PM EST by a video enabled telemedicine application and verified that I am speaking with the correct person using two identifiers.  Location: Patient: home  Provider: office    I discussed the limitations of evaluation and management by telemedicine and the availability of in person appointments. The patient expressed understanding and agreed to proceed.  History of Present Illness: Symptoms of covid started yesterday   Husband had it   Achey and a little chilled  No fever  per thermometer  Congestion  Yellow mucous  Cough -mild  Pnd   Slight headache  Ears are plugged feeling  Throat is ok    No wheezing  No sob   No n/v/d    Patient Active Problem List   Diagnosis Date Noted   Current use of proton pump inhibitor 06/02/2020   Aortic atherosclerosis ([place]) 06/22/2017   Sinus tachycardia 09/21/2014   Anxiety 07/08/2014   History of breast cancer 06/19/2014   Encounter for Medicare annual wellness exam 04/25/2014   Estrogen deficiency 04/25/2014   Stress reaction 01/03/2014   Routine general medical examination at a health care facility 02/26/2011   HYPERCHOLESTEROLEMIA, PURE 02/01/2007   Essential hypertension 09/08/2006   ALLERGIC RHINITIS 09/08/2006   TOBACCO ABUSE, HX OF 09/08/2006   Past Medical History:  Diagnosis Date   Allergic rhinitis    Allergy    Anxiety    Arthritis    back, right hand, ? knees    Breast cancer of lower-outer quadrant of right female breast ([place]) 06/19/2014   Cataract 03/2018   bilateral eyes   Disorder of bone and cartilage, unspecified    Family history of malignant neoplasm of breast    Family history of other kidney diseases    GERD (gastroesophageal reflux disease)    History of chemotherapy 2016   History of radiation therapy 2016   HLD (hyperlipidemia)    HTN (hypertension)    PONV (postoperative nausea and vomiting)    Tobacco abuse     Past Surgical History:  Procedure Laterality Date   ABDOMINAL HYSTERECTOMY     APPENDECTOMY     BREAST LUMPECTOMY WITH NEEDLE LOCALIZATION AND AXILLARY SENTINEL LYMPH NODE BX Right 07/21/2014   Procedure: BREAST LUMPECTOMY WITH NEEDLE LOCALIZATION AND AXILLARY SENTINEL LYMPH NODE BIOPSY;  Surgeon: Rolm Bookbinder, MD;  Location: [facility];  Service: General;  Laterality: Right;   COLONOSCOPY     DILATION AND CURETTAGE OF UTERUS     PORT-A-CATH REMOVAL N/A 03/12/2015   Procedure: REMOVAL PORT-A-CATH;  Surgeon: Rolm Bookbinder, MD;  Location: [facility];  Service: General;  Laterality: N/A;   PORTACATH PLACEMENT Right 09/10/2014   Procedure: INSERTION PORT-A-CATH;  Surgeon: Rolm Bookbinder, MD;  Location: [facility];  Service: General;  Laterality: Right;   TOTAL VAGINAL HYSTERECTOMY  1996   endometriosis   Social History   Tobacco Use   Smoking status: Former    Packs/day: 1.00    Types: Cigarettes    Quit date: 06/25/2010    Years since quitting: 11.7   Smokeless tobacco: Never  Vaping Use   Vaping Use: Never used  Substance Use Topics   Alcohol use: Yes    Alcohol/week: 0.0 standard drinks of alcohol    Comment: rarely wine/ 2x per year   Drug use: No   Family History  Problem Relation Age of Onset   Hypertension Mother    Kidney  disease Father    Breast cancer Sister 68   Diabetes Paternal Aunt    Colon cancer Neg Hx    Colon polyps Neg Hx    Esophageal cancer Neg Hx    Stomach cancer Neg Hx    Rectal cancer Neg Hx    Allergies  Allergen Reactions   Dust Mite Extract    Pollen Extract    Current Outpatient Medications on File Prior to Visit  Medication Sig Dispense Refill   ALPRAZolam (XANAX) 0.5 MG tablet TAKE 1 TABLET BY MOUTH EVERY DAY AS NEEDED 30 tablet 0   aspirin 81 MG chewable tablet Chew 81 mg by mouth daily.     atorvastatin (LIPITOR) 10 MG tablet Take 1 tablet (10 mg total) by mouth daily. 90 tablet 3   Calcium  Carbonate-Vitamin D (CALCIUM-VITAMIN D) 500-200 MG-UNIT per tablet Take 3 tablets by mouth every morning.     diphenhydrAMINE (BENADRYL) 25 MG tablet Take 25 mg by mouth daily as needed for allergies.     Docusate Calcium (STOOL SOFTENER PO) Take 2 capsules by mouth daily.     Glucosamine-Chondroit-Vit C-Mn (GLUCOSAMINE 1500 COMPLEX) CAPS Take by mouth.     lisinopril-hydrochlorothiazide (ZESTORETIC) 20-25 MG tablet Take 1 tablet by mouth daily. 90 tablet 3   metoprolol succinate (TOPROL-XL) 50 MG 24 hr tablet TAKE 1 TABLET EVERY DAY WITH OR IMMEDIATELY FOLLOWING A MEAL 90 tablet 3   Multiple Vitamin (MULTIVITAMIN) tablet Take 1 tablet by mouth daily.     b complex vitamins tablet Take 1 tablet by mouth daily. (Patient not taking: Reported on 03/30/2022)     No current facility-administered medications on file prior to visit.   Review of Systems  Constitutional:  Positive for chills and malaise/fatigue. Negative for fever.  HENT:  Positive for congestion. Negative for ear pain, sinus pain and sore throat.   Eyes:  Negative for blurred vision, discharge and redness.  Respiratory:  Positive for cough and sputum production. Negative for shortness of breath, wheezing and stridor.   Cardiovascular:  Negative for chest pain, palpitations and leg swelling.  Gastrointestinal:  Negative for abdominal pain, diarrhea, nausea and vomiting.  Musculoskeletal:  Negative for myalgias.  Skin:  Negative for rash.  Neurological:  Positive for headaches. Negative for dizziness.       Observations/Objective: Patient appears well, in no distress Weight is baseline  No facial swelling or asymmetry Normal voice-not hoarse and no slurred speech No obvious tremor or mobility impairment Moving neck and UEs normally Able to hear the call well  Occ dry sounding cough.  No audible wheeze or sob with speech  Talkative and mentally sharp with no cognitive changes No skin changes on face or neck , no rash or  pallor Affect is normal    Assessment and Plan: Problem List Items Addressed This Visit       Other   COVID-19 - Primary    Fairly mild symptoms started yesterday.  Husband is home with it as well  Disc symptom care  Px paxlovid and disc poss side eff  Rev ER precautions   Update if not starting to improve in a week or if worsening    Will isolate min of 5 d or until symptoms are better then mask for 10 more days       Relevant Medications   nirmatrelvir/ritonavir EUA (PAXLOVID) 20 x 150 MG & 10 x '100MG'$  TABS     Follow Up Instructions: Drink fluids and rest  mucinex  DM is good for cough and congestion  Nasal saline for congestion as needed  Tylenol or ibuprofen for fever or pain or headache  Please alert [place] if symptoms worsen (if severe or short of breath please go to the ER)   Take the paxlovid as directed, hold it if side effects and let us know  Isolate 5 days minimum or until symptoms improve Then mask for an additional 10 days   Update if not starting to improve in a week or if worsening    I discussed the assessment and treatment plan with the patient. The patient was provided an opportunity to ask questions and all were answered. The patient agreed with the plan and demonstrated an understanding of the instructions.   The patient was advised to call back or seek an in-person evaluation if the symptoms worsen or if the condition fails to improve as anticipated.     Loura Pardon, MD

## 2022-06-04 ENCOUNTER — Other Ambulatory Visit: Payer: Self-pay | Admitting: Family Medicine

## 2022-06-08 ENCOUNTER — Telehealth: Payer: Self-pay | Admitting: Family Medicine

## 2022-06-08 DIAGNOSIS — E7800 Pure hypercholesterolemia, unspecified: Secondary | ICD-10-CM

## 2022-06-08 DIAGNOSIS — Z79899 Other long term (current) drug therapy: Secondary | ICD-10-CM

## 2022-06-08 DIAGNOSIS — I10 Essential (primary) hypertension: Secondary | ICD-10-CM

## 2022-06-08 NOTE — Telephone Encounter (Signed)
-----  Message from Velna Hatchet, RT sent at 05/24/2022  4:06 PM EST ----- Regarding: Thu 1/25 lab Patient is scheduled for cpx, please order future labs.  Thanks, Anda Kraft

## 2022-06-09 ENCOUNTER — Other Ambulatory Visit (INDEPENDENT_AMBULATORY_CARE_PROVIDER_SITE_OTHER): Payer: Medicare Other

## 2022-06-09 DIAGNOSIS — I10 Essential (primary) hypertension: Secondary | ICD-10-CM | POA: Diagnosis not present

## 2022-06-09 DIAGNOSIS — Z79899 Other long term (current) drug therapy: Secondary | ICD-10-CM | POA: Diagnosis not present

## 2022-06-09 DIAGNOSIS — E7800 Pure hypercholesterolemia, unspecified: Secondary | ICD-10-CM | POA: Diagnosis not present

## 2022-06-09 LAB — LIPID PANEL
Cholesterol: 169 mg/dL (ref 0–200)
HDL: 45.3 mg/dL (ref >39.00)
LDL Cholesterol: 93 mg/dL (ref 0–99)
NonHDL: 123.47
Total CHOL/HDL Ratio: 4
Triglycerides: 150 mg/dL — ABNORMAL HIGH (ref 0.0–149.0)
VLDL: 30 mg/dL (ref 0.0–40.0)

## 2022-06-09 LAB — CBC WITH DIFFERENTIAL/PLATELET
Basophils Absolute: 0.1 10*3/uL (ref 0.0–0.1)
Basophils Relative: 1.3 % (ref 0.0–3.0)
Eosinophils Absolute: 0.3 10*3/uL (ref 0.0–0.7)
Eosinophils Relative: 3.9 % (ref 0.0–5.0)
HCT: 38.4 % (ref 36.0–46.0)
Hemoglobin: 13.2 g/dL (ref 12.0–15.0)
Lymphocytes Relative: 24.4 % (ref 12.0–46.0)
Lymphs Abs: 1.8 10*3/uL (ref 0.7–4.0)
MCHC: 34.5 g/dL (ref 30.0–36.0)
MCV: 95.5 fl (ref 78.0–100.0)
Monocytes Absolute: 0.7 10*3/uL (ref 0.1–1.0)
Monocytes Relative: 9.9 % (ref 3.0–12.0)
Neutro Abs: 4.4 10*3/uL (ref 1.4–7.7)
Neutrophils Relative %: 60.5 % (ref 43.0–77.0)
Platelets: 262 10*3/uL (ref 150.0–400.0)
RBC: 4.02 Mil/uL (ref 3.87–5.11)
RDW: 13.3 % (ref 11.5–15.5)
WBC: 7.3 10*3/uL (ref 4.0–10.5)

## 2022-06-09 LAB — COMPREHENSIVE METABOLIC PANEL
ALT: 22 U/L (ref 0–35)
AST: 22 U/L (ref 0–37)
Albumin: 4.5 g/dL (ref 3.5–5.2)
Alkaline Phosphatase: 53 U/L (ref 39–117)
BUN: 23 mg/dL (ref 6–23)
CO2: 27 mEq/L (ref 19–32)
Calcium: 9.7 mg/dL (ref 8.4–10.5)
Chloride: 104 mEq/L (ref 96–112)
Creatinine, Ser: 0.87 mg/dL (ref 0.40–1.20)
GFR: 66.18 mL/min (ref >60.00)
Glucose, Bld: 99 mg/dL (ref 70–99)
Potassium: 4 mEq/L (ref 3.5–5.1)
Sodium: 140 mEq/L (ref 135–145)
Total Bilirubin: 0.5 mg/dL (ref 0.2–1.2)
Total Protein: 7.3 g/dL (ref 6.0–8.3)

## 2022-06-09 LAB — TSH: TSH: 2.32 u[IU]/mL (ref 0.35–5.50)

## 2022-06-09 LAB — VITAMIN B12: Vitamin B-12: 439 pg/mL (ref 211–911)

## 2022-06-14 ENCOUNTER — Other Ambulatory Visit: Payer: Self-pay | Admitting: Family Medicine

## 2022-06-16 ENCOUNTER — Ambulatory Visit (INDEPENDENT_AMBULATORY_CARE_PROVIDER_SITE_OTHER): Payer: Medicare Other

## 2022-06-16 ENCOUNTER — Encounter: Payer: Medicare Other | Admitting: Family Medicine

## 2022-06-16 VITALS — Ht 61.0 in | Wt 134.0 lb

## 2022-06-16 DIAGNOSIS — Z0000 Encounter for general adult medical examination without abnormal findings: Secondary | ICD-10-CM | POA: Diagnosis not present

## 2022-06-16 NOTE — Progress Notes (Signed)
Virtual Visit via Telephone Note  I connected with  Kess Swiger on 06/16/22 at  9:15 AM EST by telephone and verified that I am speaking with the correct person using two identifiers.  Location: Patient: home Provider: Dion Body Persons participating in the virtual visit: [place]   I discussed the limitations, risks, security and privacy concerns of performing an evaluation and management service by telephone and the availability of in person appointments. The patient expressed understanding and agreed to proceed.  Interactive audio and video telecommunications were attempted between this nurse and patient, however failed, due to patient having technical difficulties OR patient did not have access to video capability.  We continued and completed visit with audio only.  Some vital signs may be absent or patient reported.   Linzie Boursiquot Moody Bruins, LPN  Subjective:   Deslyn Cavenaugh is a 72 y.o. female who presents for Medicare Annual (Subsequent) preventive examination.  Review of Systems     Cardiac Risk Factors include: advanced age (>95mn, >>71women);hypertension     Objective:    Today's Vitals   06/16/22 0910  Weight: 134 lb (60.8 kg)  Height: '5\' 1"'$  (1.549 m)   Body mass index is 25.32 kg/m.     06/16/2022    9:22 AM 05/20/2019   11:18 AM 05/17/2018   10:14 AM 06/15/2016   10:51 AM 02/23/2016    2:19 PM 03/12/2015    6:47 AM 03/05/2015    1:46 PM  Advanced Directives  Does Patient Have a Medical Advance Directive? Yes Yes Yes Yes Yes Yes Yes  Type of [place] will Healthcare Power of [place] will  Living will Living will  Does patient want to make changes to medical advance directive?      No - Patient declined   Copy of [place] Chart? No - copy requested No - copy requested No - copy requested No - copy  requested  No - copy requested     Current Medications (verified) Outpatient Encounter Medications as of 06/16/2022  Medication Sig   ALPRAZolam (XANAX) 0.5 MG tablet TAKE 1 TABLET BY MOUTH EVERY DAY AS NEEDED   aspirin 81 MG chewable tablet Chew 81 mg by mouth daily.   atorvastatin (LIPITOR) 10 MG tablet TAKE 1 TABLET BY MOUTH EVERY DAY   Calcium Carbonate-Vitamin D (CALCIUM-VITAMIN D) 500-200 MG-UNIT per tablet Take 3 tablets by mouth every morning.   diphenhydrAMINE (BENADRYL) 25 MG tablet Take 25 mg by mouth daily as needed for allergies.   Docusate Calcium (STOOL SOFTENER PO) Take 2 capsules by mouth daily.   Glucosamine-Chondroit-Vit C-Mn (GLUCOSAMINE 1500 COMPLEX) CAPS Take by mouth.   lisinopril-hydrochlorothiazide (ZESTORETIC) 20-25 MG tablet Take 1 tablet by mouth daily.   metoprolol succinate (TOPROL-XL) 50 MG 24 hr tablet TAKE 1 TABLET EVERY DAY WITH OR IMMEDIATELY FOLLOWING A MEAL   Multiple Vitamin (MULTIVITAMIN) tablet Take 1 tablet by mouth daily.   b complex vitamins tablet Take 1 tablet by mouth daily. (Patient not taking: Reported on 03/30/2022)   No facility-administered encounter medications on file as of 06/16/2022.    Allergies (verified) Dust mite extract and Pollen extract   History: Past Medical History:  Diagnosis Date   Allergic rhinitis    Allergy    Anxiety    Arthritis    back, right hand, ? knees    Breast cancer of lower-outer quadrant of right female  breast ([place]) 06/19/2014   Cataract 03/2018   bilateral eyes   Disorder of bone and cartilage, unspecified    Family history of malignant neoplasm of breast    Family history of other kidney diseases    GERD (gastroesophageal reflux disease)    History of chemotherapy 2016   History of radiation therapy 2016   HLD (hyperlipidemia)    HTN (hypertension)    PONV (postoperative nausea and vomiting)    Tobacco abuse    Past Surgical History:  Procedure Laterality Date   ABDOMINAL HYSTERECTOMY      APPENDECTOMY     BREAST LUMPECTOMY WITH NEEDLE LOCALIZATION AND AXILLARY SENTINEL LYMPH NODE BX Right 07/21/2014   Procedure: BREAST LUMPECTOMY WITH NEEDLE LOCALIZATION AND AXILLARY SENTINEL LYMPH NODE BIOPSY;  Surgeon: Rolm Bookbinder, MD;  Location: [facility];  Service: General;  Laterality: Right;   COLONOSCOPY     DILATION AND CURETTAGE OF UTERUS     PORT-A-CATH REMOVAL N/A 03/12/2015   Procedure: REMOVAL PORT-A-CATH;  Surgeon: Rolm Bookbinder, MD;  Location: [facility];  Service: General;  Laterality: N/A;   PORTACATH PLACEMENT Right 09/10/2014   Procedure: INSERTION PORT-A-CATH;  Surgeon: Rolm Bookbinder, MD;  Location: [facility];  Service: General;  Laterality: Right;   TOTAL VAGINAL HYSTERECTOMY  1996   endometriosis   Family History  Problem Relation Age of Onset   Hypertension Mother    Kidney disease Father    Breast cancer Sister 32   Diabetes Paternal Aunt    Colon cancer Neg Hx    Colon polyps Neg Hx    Esophageal cancer Neg Hx    Stomach cancer Neg Hx    Rectal cancer Neg Hx    Social History   Socioeconomic History   Marital status: Married    Spouse name: Not on file   Number of children: 2   Years of education: Not on file   Highest education level: Not on file  Occupational History   Not on file  Tobacco Use   Smoking status: Former    Packs/day: 1.00    Types: Cigarettes    Quit date: 06/25/2010    Years since quitting: 11.9   Smokeless tobacco: Never  Vaping Use   Vaping Use: Never used  Substance and Sexual Activity   Alcohol use: Yes    Alcohol/week: 0.0 standard drinks of alcohol    Comment: rarely wine/ 2x per year   Drug use: No   Sexual activity: Not on file  Other Topics Concern   Not on file  Social History Narrative   Gym and walking for exercise      Cares for elderly mother and sister      married   Social Determinants of Health   Financial Resource Strain: Low Risk  (06/16/2022)   Overall  Financial Resource Strain (CARDIA)    Difficulty of Paying Living Expenses: Not hard at all  Food Insecurity: No Food Insecurity (06/16/2022)   Hunger Vital Sign    Worried About Running Out of Food in the Last Year: Never true    [place] in the Last Year: Never true  Transportation Needs: No Transportation Needs (06/16/2022)   PRAPARE - Hydrologist (Medical): No    Lack of Transportation (Non-Medical): No  Physical Activity: Insufficiently Active (06/16/2022)   Exercise Vital Sign    Days of Exercise per Week: 3 days    Minutes of Exercise per Session:  30 min  Stress: No Stress Concern Present (06/16/2022)   [place]    Feeling of Stress : Only a little  Social Connections: Moderately Integrated (06/16/2022)   Social Connection and Isolation Panel [NHANES]    Frequency of Communication with Friends and Family: More than three times a week    Frequency of Social Gatherings with Friends and Family: Never    Attends Religious Services: More than 4 times per year    Active Member of [employer] or Organizations: No    Attends Music therapist: Never    Marital Status: Married    Tobacco Counseling Counseling given: Not Answered   Clinical Intake:  Pre-visit preparation completed: Yes  Pain : No/denies pain     Nutritional Risks: None Diabetes: No  How often do you need to have someone help you when you read instructions, pamphlets, or other written materials from your doctor or pharmacy?: 1 - Never  Diabetic?no  Interpreter Needed?: No  Information entered by :: C.Bronwyn Belasco LPN   Activities of Daily Living    06/16/2022    9:24 AM  In your present state of health, do you have any difficulty performing the following activities:  Hearing? 0  Vision? 0  Difficulty concentrating or making decisions? 0  Walking or climbing stairs? 0  Dressing or bathing? 0  Doing  errands, shopping? 0  Preparing Food and eating ? N  Using the Toilet? N  In the past six months, have you accidently leaked urine? Y  Comment Having a few accidents when waits to long to go to restroom.  Do you have problems with loss of bowel control? N  Managing your Medications? N  Managing your Finances? N  Housekeeping or managing your Housekeeping? N    Patient Care Team: Tower, Wynelle Fanny, MD as PCP - General Rolm Bookbinder, MD as Consulting Physician (General Surgery) Magrinat, Virgie Dad, MD (Inactive) as Consulting Physician (Oncology) Eppie Gibson, MD as Attending Physician (Radiation Oncology) Rockwell Germany, RN as Registered Nurse Mauro Kaufmann, RN as Registered Nurse Holley Bouche, NP (Inactive) as Nurse Practitioner (Nurse Practitioner) Sylvan Cheese, NP as Nurse Practitioner (Nurse Practitioner)  Indicate any recent Medical Services you may have received from other than [facility] providers in the past year (date may be approximate).     Assessment:   This is a routine wellness examination for [place].  Hearing/Vision screen Hearing Screening - Comments:: No aids Vision Screening - Comments:: [facility] Dr.Bowen -  Readers   Dietary issues and exercise activities discussed: Current Exercise Habits: Home exercise routine, Type of exercise: treadmill;walking, Time (Minutes): 30, Frequency (Times/Week): 3, Weekly Exercise (Minutes/Week): 90, Intensity: Mild, Exercise limited by: None identified   Goals Addressed               This Visit's Progress     Patient Stated (pt-stated)        Exercise more.       Depression Screen    06/16/2022    9:18 AM 03/30/2022   12:19 PM 06/15/2021    8:52 AM 06/11/2020    9:24 AM 05/20/2019   11:20 AM 05/17/2018   10:07 AM 06/15/2016   10:51 AM  PHQ 2/9 Scores  PHQ - 2 Score 0 0 0 0 0 0 0  PHQ- 9 Score     0 0     Fall Risk    06/16/2022    9:24 AM  03/30/2022   12:19 PM 06/15/2021    8:52 AM  06/11/2020    9:23 AM 05/20/2019   11:19 AM  Fall Risk   Falls in the past year? 0 0 0 0 0  Number falls in past yr: 0 0   0  Injury with Fall? 0 0   0  Risk for fall due to : No Fall Risks No Fall Risks   Medication side effect  Follow up Falls prevention discussed;Falls evaluation completed Falls evaluation completed  Falls evaluation completed Falls evaluation completed;Falls prevention discussed    FALL RISK PREVENTION PERTAINING TO THE HOME:  Any stairs in or around the home? Yes  If so, are there any without handrails? Yes  Home free of loose throw rugs in walkways, pet beds, electrical cords, etc? Yes  Adequate lighting in your home to reduce risk of falls? Yes   ASSISTIVE DEVICES UTILIZED TO PREVENT FALLS:  Life alert? No  Use of a cane, walker or w/c? No  Grab bars in the bathroom? Yes  Shower chair or bench in shower? No  Elevated toilet seat or a handicapped toilet? Yes    Cognitive Function:    05/20/2019   11:21 AM 05/17/2018   10:07 AM 06/15/2016   10:40 AM  MMSE - Mini Mental State Exam  Orientation to time '5 5 5  '$ Orientation to Place '5 5 5  '$ Registration '3 3 3  '$ Attention/ Calculation 5 0 0  Recall '3 3 3  '$ Language- name 2 objects  0 0  Language- repeat '1 1 1  '$ Language- follow 3 step command  3 3  Language- read & follow direction  0 0  Write a sentence  0 0  Copy design  0 0  Total score  20 20        06/16/2022    9:27 AM  6CIT Screen  What Year? 0 points  What month? 0 points  What time? 0 points  Count back from 20 0 points  Months in reverse 0 points  Repeat phrase 0 points  Total Score 0 points    Immunizations Immunization History  Administered Date(s) Administered   Fluad Quad(high Dose 65+) 01/23/2019   Influenza Split 03/04/2011   Influenza Whole 02/13/2008, 02/16/2009, 03/01/2010   Influenza, High Dose Seasonal PF 03/12/2015, 01/26/2016, 02/10/2017, 01/26/2018, 02/10/2020, 01/26/2021   Influenza-Unspecified 02/14/2014   PFIZER  Comirnaty(Gray Top)Covid-19 Tri-Sucrose Vaccine 08/17/2020   PFIZER(Purple Top)SARS-COV-2 Vaccination 06/07/2019, 06/28/2019, 02/29/2020   Pfizer Covid-19 Vaccine Bivalent Booster 22yr & up 02/10/2021   Pneumococcal Conjugate-13 04/28/2015   Pneumococcal Polysaccharide-23 02/13/2008, 04/25/2014   Td 10/04/2001   Tdap 03/04/2011, 08/11/2021    TDAP status: Up to date  Flu Vaccine status: Up to date  Pneumococcal vaccine status: Due, Education has been provided regarding the importance of this vaccine. Advised may receive this vaccine at local pharmacy or Health Dept. Aware to provide a copy of the vaccination record if obtained from local pharmacy or Health Dept. Verbalized acceptance and understanding.  Covid-19 vaccine status: Completed vaccines  Qualifies for Shingles Vaccine? Yes   Zostavax completed Yes   Shingrix Completed?: No.    Education has been provided regarding the importance of this vaccine. Patient has been advised to call insurance company to determine out of pocket expense if they have not yet received this vaccine. Advised may also receive vaccine at local pharmacy or Health Dept. Verbalized acceptance and understanding.  Screening Tests Health Maintenance  Topic Date Due  Zoster Vaccines- Shingrix (1 of 2) Never done   INFLUENZA VACCINE  12/14/2021   COVID-19 Vaccine (6 - 2023-24 season) 01/14/2022   MAMMOGRAM  08/19/2022   Medicare Annual Wellness (AWV)  06/17/2023   COLONOSCOPY (Pts 45-81yr Insurance coverage will need to be confirmed)  04/21/2029   DTaP/Tdap/Td (4 - Td or Tdap) 08/12/2031   Pneumonia Vaccine 74 Years old  Completed   DEXA SCAN  Completed   Hepatitis C Screening  Completed   HPV VACCINES  Aged Out    Health Maintenance  Health Maintenance Due  Topic Date Due   Zoster Vaccines- Shingrix (1 of 2) Never done   INFLUENZA VACCINE  12/14/2021   COVID-19 Vaccine (6 - 2023-24 season) 01/14/2022    Colorectal cancer screening: Type of  screening: Colonoscopy. Completed 04/22/2019. Repeat every 10 years  Mammogram status: Completed 08/18/2021. Repeat every year   Bone Density status: Completed 07/01/2020. Results reflect: Bone density results: NORMAL. Repeat every 2 years.  Lung Cancer Screening: (Low Dose CT Chest recommended if Age 74-80years, 30 pack-year currently smoking OR have quit w/in 15years.) does not qualify.   Lung Cancer Screening Referral: no  Additional Screening:  Hepatitis C Screening: does not qualify; Completed 06/15/2016  Vision Screening: Recommended annual ophthalmology exams for early detection of glaucoma and other disorders of the eye. Is the patient up to date with their annual eye exam?  Yes  Who is the provider or what is the name of the office in which the patient attends annual eye exams? Dr.Bowen If pt is not established with a provider, would they like to be referred to a provider to establish care? No .   Dental Screening: Recommended annual dental exams for proper oral hygiene  Community Resource Referral / Chronic Care Management: CRR required this visit?  No   CCM required this visit?  No      Plan:     I have personally reviewed and noted the following in the patient's chart:   Medical and social history Use of alcohol, tobacco or illicit drugs  Current medications and supplements including opioid prescriptions. Patient is not currently taking opioid prescriptions. Functional ability and status Nutritional status Physical activity Advanced directives List of other physicians Hospitalizations, surgeries, and ER visits in previous 12 months Vitals Screenings to include cognitive, depression, and falls Referrals and appointments  In addition, I have reviewed and discussed with patient certain preventive protocols, quality metrics, and best practice recommendations. A written personalized care plan for preventive services as well as general preventive health  recommendations were provided to patient.     CLebron Conners LPN   23/0/0762  Nurse Notes: none

## 2022-06-16 NOTE — Patient Instructions (Signed)
Jamie Dillon , Thank you for taking time to come for your Medicare Wellness Visit. I appreciate your ongoing commitment to your health goals. Please review the following plan we discussed and let me know if I can assist you in the future.   These are the goals we discussed:  Goals       Increase physical activity      When tolerated and weather permitting, I will resume walking 30 min 5-7 days per week.      Patient Stated      05/20/2019, I will try to increase my exercise on a daily basis.       Patient Stated (pt-stated)      Exercise more.        This is a list of the screening recommended for you and due dates:  Health Maintenance  Topic Date Due   Zoster (Shingles) Vaccine (1 of 2) Never done   Flu Shot  12/14/2021   COVID-19 Vaccine (6 - 2023-24 season) 01/14/2022   Mammogram  08/19/2022   Medicare Annual Wellness Visit  06/17/2023   Colon Cancer Screening  04/21/2029   DTaP/Tdap/Td vaccine (4 - Td or Tdap) 08/12/2031   Pneumonia Vaccine  Completed   DEXA scan (bone density measurement)  Completed   Hepatitis C Screening: USPSTF Recommendation to screen - Ages 25-79 yo.  Completed   HPV Vaccine  Aged Out    Advanced directives: yes copy requested.  Conditions/risks identified: none  Next appointment: Follow up in one year for your annual wellness visit 06/20/2023 @ 8:00 via telephone.   Preventive Care 62 Years and Older, Female Preventive care refers to lifestyle choices and visits with your health care provider that can promote health and wellness. What does preventive care include? A yearly physical exam. This is also called an annual well check. Dental exams once or twice a year. Routine eye exams. Ask your health care provider how often you should have your eyes checked. Personal lifestyle choices, including: Daily care of your teeth and gums. Regular physical activity. Eating a healthy diet. Avoiding tobacco and drug use. Limiting alcohol use. Practicing  safe sex. Taking low-dose aspirin every day. Taking vitamin and mineral supplements as recommended by your health care provider. What happens during an annual well check? The services and screenings done by your health care provider during your annual well check will depend on your age, overall health, lifestyle risk factors, and family history of disease. Counseling  Your health care provider may ask you questions about your: Alcohol use. Tobacco use. Drug use. Emotional well-being. Home and relationship well-being. Sexual activity. Eating habits. History of falls. Memory and ability to understand (cognition). Work and work Statistician. Reproductive health. Screening  You may have the following tests or measurements: Height, weight, and BMI. Blood pressure. Lipid and cholesterol levels. These may be checked every 5 years, or more frequently if you are over 25 years old. Skin check. Lung cancer screening. You may have this screening every year starting at age 58 if you have a 30-pack-year history of smoking and currently smoke or have quit within the past 15 years. Fecal occult blood test (FOBT) of the stool. You may have this test every year starting at age 90. Flexible sigmoidoscopy or colonoscopy. You may have a sigmoidoscopy every 5 years or a colonoscopy every 10 years starting at age 36. Hepatitis C blood test. Hepatitis B blood test. Sexually transmitted disease (STD) testing. Diabetes screening. This is done by checking your  blood sugar (glucose) after you have not eaten for a while (fasting). You may have this done every 1-3 years. Bone density scan. This is done to screen for osteoporosis. You may have this done starting at age 59. Mammogram. This may be done every 1-2 years. Talk to your health care provider about how often you should have regular mammograms. Talk with your health care provider about your test results, treatment options, and if necessary, the need for more  tests. Vaccines  Your health care provider may recommend certain vaccines, such as: Influenza vaccine. This is recommended every year. Tetanus, diphtheria, and acellular pertussis (Tdap, Td) vaccine. You may need a Td booster every 10 years. Zoster vaccine. You may need this after age 75. Pneumococcal 13-valent conjugate (PCV13) vaccine. One dose is recommended after age 45. Pneumococcal polysaccharide (PPSV23) vaccine. One dose is recommended after age 35. Talk to your health care provider about which screenings and vaccines you need and how often you need them. This information is not intended to replace advice given to you by your health care provider. Make sure you discuss any questions you have with your health care provider. Document Released: 05/29/2015 Document Revised: 01/20/2016 Document Reviewed: 03/03/2015 Elsevier Interactive Patient Education  2017 [place] Prevention in the Home Falls can cause injuries. They can happen to people of all ages. There are many things you can do to make your home safe and to help prevent falls. What can I do on the outside of my home? Regularly fix the edges of walkways and driveways and fix any cracks. Remove anything that might make you trip as you walk through a door, such as a raised step or threshold. Trim any bushes or trees on the path to your home. Use bright outdoor lighting. Clear any walking paths of anything that might make someone trip, such as rocks or tools. Regularly check to see if handrails are loose or broken. Make sure that both sides of any steps have handrails. Any raised decks and porches should have guardrails on the edges. Have any leaves, snow, or ice cleared regularly. Use sand or salt on walking paths during winter. Clean up any spills in your garage right away. This includes oil or grease spills. What can I do in the bathroom? Use night lights. Install grab bars by the toilet and in the tub and shower.  Do not use towel bars as grab bars. Use non-skid mats or decals in the tub or shower. If you need to sit down in the shower, use a plastic, non-slip stool. Keep the floor dry. Clean up any water that spills on the floor as soon as it happens. Remove soap buildup in the tub or shower regularly. Attach bath mats securely with double-sided non-slip rug tape. Do not have throw rugs and other things on the floor that can make you trip. What can I do in the bedroom? Use night lights. Make sure that you have a light by your bed that is easy to reach. Do not use any sheets or blankets that are too big for your bed. They should not hang down onto the floor. Have a firm chair that has side arms. You can use this for support while you get dressed. Do not have throw rugs and other things on the floor that can make you trip. What can I do in the kitchen? Clean up any spills right away. Avoid walking on wet floors. Keep items that you use a lot in  easy-to-reach places. If you need to reach something above you, use a strong step stool that has a grab bar. Keep electrical cords out of the way. Do not use floor polish or wax that makes floors slippery. If you must use wax, use non-skid floor wax. Do not have throw rugs and other things on the floor that can make you trip. What can I do with my stairs? Do not leave any items on the stairs. Make sure that there are handrails on both sides of the stairs and use them. Fix handrails that are broken or loose. Make sure that handrails are as long as the stairways. Check any carpeting to make sure that it is firmly attached to the stairs. Fix any carpet that is loose or worn. Avoid having throw rugs at the top or bottom of the stairs. If you do have throw rugs, attach them to the floor with carpet tape. Make sure that you have a light switch at the top of the stairs and the bottom of the stairs. If you do not have them, ask someone to add them for you. What else  can I do to help prevent falls? Wear shoes that: Do not have high heels. Have rubber bottoms. Are comfortable and fit you well. Are closed at the toe. Do not wear sandals. If you use a stepladder: Make sure that it is fully opened. Do not climb a closed stepladder. Make sure that both sides of the stepladder are locked into place. Ask someone to hold it for you, if possible. Clearly mark and make sure that you can see: Any grab bars or handrails. First and last steps. Where the edge of each step is. Use tools that help you move around (mobility aids) if they are needed. These include: Canes. Walkers. Scooters. Crutches. Turn on the lights when you go into a dark area. Replace any light bulbs as soon as they burn out. Set up your furniture so you have a clear path. Avoid moving your furniture around. If any of your floors are uneven, fix them. If there are any pets around you, be aware of where they are. Review your medicines with your doctor. Some medicines can make you feel dizzy. This can increase your chance of falling. Ask your doctor what other things that you can do to help prevent falls. This information is not intended to replace advice given to you by your health care provider. Make sure you discuss any questions you have with your health care provider. Document Released: 02/26/2009 Document Revised: 10/08/2015 Document Reviewed: 06/06/2014 Elsevier Interactive Patient Education  2017 [employer].

## 2022-06-17 ENCOUNTER — Encounter: Payer: Self-pay | Admitting: Family Medicine

## 2022-06-17 ENCOUNTER — Ambulatory Visit (INDEPENDENT_AMBULATORY_CARE_PROVIDER_SITE_OTHER): Payer: Medicare Other | Admitting: Family Medicine

## 2022-06-17 VITALS — BP 122/78 | HR 72 | Temp 97.8°F | Ht 60.25 in | Wt 138.5 lb

## 2022-06-17 DIAGNOSIS — F419 Anxiety disorder, unspecified: Secondary | ICD-10-CM

## 2022-06-17 DIAGNOSIS — I10 Essential (primary) hypertension: Secondary | ICD-10-CM | POA: Diagnosis not present

## 2022-06-17 DIAGNOSIS — E7800 Pure hypercholesterolemia, unspecified: Secondary | ICD-10-CM

## 2022-06-17 DIAGNOSIS — Z0000 Encounter for general adult medical examination without abnormal findings: Secondary | ICD-10-CM | POA: Diagnosis not present

## 2022-06-17 DIAGNOSIS — I700 Atherosclerosis of aorta: Secondary | ICD-10-CM | POA: Diagnosis not present

## 2022-06-17 MED ORDER — ALPRAZOLAM 0.5 MG PO TABS: 0.5000 mg | ORAL_TABLET | Freq: Every day | ORAL | 0 refills | Status: DC | PRN

## 2022-06-17 MED ORDER — LISINOPRIL-HYDROCHLOROTHIAZIDE 20-25 MG PO TABS: 1.0000 | ORAL_TABLET | Freq: Every day | ORAL | 3 refills | Status: DC

## 2022-06-17 NOTE — Assessment & Plan Note (Signed)
Disc goals for lipids and reasons to control them Rev last labs with pt Rev low sat fat diet in detail   LDL of 93 Trig down Plan to continue atorvastatin 10 mg daily

## 2022-06-17 NOTE — Assessment & Plan Note (Signed)
Doing ok  Takes xanax very infrequently  If she notes needing it more will need to disc another option  Data base rev, last refill was aug

## 2022-06-17 NOTE — Assessment & Plan Note (Signed)
Reviewed health habits including diet and exercise and skin cancer prevention Reviewed appropriate screening tests for age  Also reviewed health mt list, fam hx and immunization status , as well as social and family history   See HPI Labs reviewed Considering shingrix vaccine  Had flu shot  Mammogram utd 08/2021 in setting of past breast cancer, stable exam Colonoscopy utd 2020  Dexa 06/2020 -normal, taking ca and D and no falls or fx Enc to add more strength training Mood is stable

## 2022-06-17 NOTE — Assessment & Plan Note (Signed)
bp in fair control at this time  BP Readings from Last 1 Encounters:  06/17/22 122/78   No changes needed Most recent labs reviewed  Disc lifstyle change with low sodium diet and exercise  Plan to continue Lisinopril hct 20-25 mg daily  Metoprolol xl 50 mg daily

## 2022-06-17 NOTE — Progress Notes (Signed)
Subjective:    Patient ID: Jamie Dillon, female    DOB: Oct 26, 1950, 72 y.o.   MRN: [id number]  HPI Here for health maintenance exam and to review chronic medical problems    Wt Readings from Last 3 Encounters:  06/17/22 138 lb 8 oz (62.8 kg)  06/16/22 134 lb (60.8 kg)  03/30/22 133 lb (60.3 kg)   26.82 kg/m  Doing well  Trying to take care of herself   Wants to loose some weight  She does boredom eat  Goes up and down 3 lb  Hard over the holidays     Immunization History  Administered Date(s) Administered   Fluad Quad(high Dose 65+) 01/23/2019   Influenza Split 03/04/2011   Influenza Whole 02/13/2008, 02/16/2009, 03/01/2010   Influenza, High Dose Seasonal PF 03/12/2015, 01/26/2016, 02/10/2017, 01/26/2018, 02/10/2020, 01/26/2021   Influenza-Unspecified 02/14/2014   PFIZER Comirnaty(Gray Top)Covid-19 Tri-Sucrose Vaccine 08/17/2020   PFIZER(Purple Top)SARS-COV-2 Vaccination 06/07/2019, 06/28/2019, 02/29/2020   Pfizer Covid-19 Vaccine Bivalent Booster 46yr & up 02/10/2021   Pneumococcal Conjugate-13 04/28/2015   Pneumococcal Polysaccharide-23 02/13/2008, 04/25/2014   Td 10/04/2001   Tdap 03/04/2011, 08/11/2021   Health Maintenance Due  Topic Date Due   Zoster Vaccines- Shingrix (1 of 2) Never done   INFLUENZA VACCINE  12/14/2021   COVID-19 Vaccine (6 - 2023-24 season) 01/14/2022   Shingrix: afraid of side effects  May consider it Had the zostavax years ago   Flu shot : got that in the fall   Mammogram 08/2021 Personal h/o breast cancer and sister had breast ca at 421 5 y of tamoxifen in the past  Self breast exam: no changes   Has had a hysterectomy   Colonoscopy 04/2019 with 10 y recall  Dexa  06/2020   in the normal range  Falls : none  Fractures:none  Supplements  ca plus D Exercise : good / treadmill and weight   Utd derm care Had some benign moles removed  Mood  H/o anxiety Stress reaction  takes xanax infrequently    H/o aortic  atherosclerosis   HTN bp is stable today  No cp or palpitations or headaches or edema  No side effects to medicines  BP Readings from Last 3 Encounters:  06/17/22 122/78  06/15/21 122/74  06/11/20 126/74    Pulse Readings from Last 3 Encounters:  06/17/22 72  06/15/21 77  06/11/20 81    Lisinopril hct 20-25 mg daily  Metoprolol xl 50 mg daily    Lab Results  Component Value Date   CREATININE 0.87 06/09/2022   BUN 23 06/09/2022   NA 140 06/09/2022   K 4.0 06/09/2022   CL 104 06/09/2022   CO2 27 06/09/2022   Hyperlipidemia Lab Results  Component Value Date   CHOL 169 06/09/2022   CHOL 181 06/15/2021   CHOL 152 06/04/2020   Lab Results  Component Value Date   HDL 45.30 06/09/2022   HDL 45.00 06/15/2021   HDL 51.30 06/04/2020   Lab Results  Component Value Date   LDLCALC 93 06/09/2022   LDLCALC 97 06/15/2021   LDLCALC 77 06/04/2020   Lab Results  Component Value Date   TRIG 150.0 (H) 06/09/2022   TRIG 193.0 (H) 06/15/2021   TRIG 120.0 06/04/2020   Lab Results  Component Value Date   CHOLHDL 4 06/09/2022   CHOLHDL 4 06/15/2021   CHOLHDL 3 06/04/2020   Lab Results  Component Value Date   LDLDIRECT 127.3 03/01/2011   Atorvastatin 10 mg daily  Is eating less sugar also   The 10-year ASCVD risk score (Arnett DK, et al., 2019) is: 15.5%   Values used to calculate the score:     Age: 15 years     Sex: Female     Is Non-Hispanic African American: No     Diabetic: No     Tobacco smoker: No     Systolic Blood Pressure: 092 mmHg     Is BP treated: Yes     HDL Cholesterol: 45.3 mg/dL     Total Cholesterol: 169 mg/dL   Lab Results  Component Value Date   ALT 22 06/09/2022   AST 22 06/09/2022   ALKPHOS 53 06/09/2022   BILITOT 0.5 06/09/2022    Lab Results  Component Value Date   WBC 7.3 06/09/2022   HGB 13.2 06/09/2022   HCT 38.4 06/09/2022   MCV 95.5 06/09/2022   PLT 262.0 06/09/2022   Lab Results  Component Value Date   TSH 2.32  06/09/2022   Patient Active Problem List   Diagnosis Date Noted   Aortic atherosclerosis ([place]) 06/22/2017   Anxiety 07/08/2014   History of breast cancer 06/19/2014   Encounter for Medicare annual wellness exam 04/25/2014   Estrogen deficiency 04/25/2014   Stress reaction 01/03/2014   Routine general medical examination at a health care facility 02/26/2011   HYPERCHOLESTEROLEMIA, PURE 02/01/2007   Essential hypertension 09/08/2006   ALLERGIC RHINITIS 09/08/2006   TOBACCO ABUSE, HX OF 09/08/2006   Past Medical History:  Diagnosis Date   Allergic rhinitis    Allergy    Anxiety    Arthritis    back, right hand, ? knees    Breast cancer of lower-outer quadrant of right female breast ([place]) 06/19/2014   Cataract 03/2018   bilateral eyes   Disorder of bone and cartilage, unspecified    Family history of malignant neoplasm of breast    Family history of other kidney diseases    GERD (gastroesophageal reflux disease)    History of chemotherapy 2016   History of radiation therapy 2016   HLD (hyperlipidemia)    HTN (hypertension)    PONV (postoperative nausea and vomiting)    Tobacco abuse    Past Surgical History:  Procedure Laterality Date   ABDOMINAL HYSTERECTOMY     APPENDECTOMY     BREAST LUMPECTOMY WITH NEEDLE LOCALIZATION AND AXILLARY SENTINEL LYMPH NODE BX Right 07/21/2014   Procedure: BREAST LUMPECTOMY WITH NEEDLE LOCALIZATION AND AXILLARY SENTINEL LYMPH NODE BIOPSY;  Surgeon: Rolm Bookbinder, MD;  Location: [facility];  Service: General;  Laterality: Right;   COLONOSCOPY     DILATION AND CURETTAGE OF UTERUS     PORT-A-CATH REMOVAL N/A 03/12/2015   Procedure: REMOVAL PORT-A-CATH;  Surgeon: Rolm Bookbinder, MD;  Location: [facility];  Service: General;  Laterality: N/A;   PORTACATH PLACEMENT Right 09/10/2014   Procedure: INSERTION PORT-A-CATH;  Surgeon: Rolm Bookbinder, MD;  Location: [facility];  Service: General;  Laterality: Right;   TOTAL  VAGINAL HYSTERECTOMY  1996   endometriosis   Social History   Tobacco Use   Smoking status: Former    Packs/day: 1.00    Types: Cigarettes    Quit date: 06/25/2010    Years since quitting: 11.9   Smokeless tobacco: Never  Vaping Use   Vaping Use: Never used  Substance Use Topics   Alcohol use: Yes    Alcohol/week: 0.0 standard drinks of alcohol    Comment: rarely wine/ 2x per year  Drug use: No   Family History  Problem Relation Age of Onset   Hypertension Mother    Kidney disease Father    Breast cancer Sister 46   Diabetes Paternal Aunt    Colon cancer Neg Hx    Colon polyps Neg Hx    Esophageal cancer Neg Hx    Stomach cancer Neg Hx    Rectal cancer Neg Hx    Allergies  Allergen Reactions   Dust Mite Extract    Pollen Extract    Current Outpatient Medications on File Prior to Visit  Medication Sig Dispense Refill   aspirin 81 MG chewable tablet Chew 81 mg by mouth daily.     atorvastatin (LIPITOR) 10 MG tablet TAKE 1 TABLET BY MOUTH EVERY DAY 90 tablet 0   b complex vitamins tablet Take 1 tablet by mouth daily.     Calcium Carbonate-Vitamin D (CALCIUM-VITAMIN D) 500-200 MG-UNIT per tablet Take 3 tablets by mouth every morning.     diphenhydrAMINE (BENADRYL) 25 MG tablet Take 25 mg by mouth daily as needed for allergies.     Docusate Calcium (STOOL SOFTENER PO) Take 2 capsules by mouth daily.     Glucosamine-Chondroit-Vit C-Mn (GLUCOSAMINE 1500 COMPLEX) CAPS Take by mouth.     metoprolol succinate (TOPROL-XL) 50 MG 24 hr tablet TAKE 1 TABLET EVERY DAY WITH OR IMMEDIATELY FOLLOWING A MEAL 90 tablet 0   Multiple Vitamin (MULTIVITAMIN) tablet Take 1 tablet by mouth daily.     No current facility-administered medications on file prior to visit.      Review of Systems  Constitutional:  Negative for activity change, appetite change, fatigue, fever and unexpected weight change.  HENT:  Negative for congestion, ear pain, rhinorrhea, sinus pressure and sore throat.    Eyes:  Negative for pain, redness and visual disturbance.  Respiratory:  Negative for cough, shortness of breath and wheezing.   Cardiovascular:  Negative for chest pain and palpitations.  Gastrointestinal:  Negative for abdominal pain, blood in stool, constipation and diarrhea.  Endocrine: Negative for polydipsia and polyuria.  Genitourinary:  Negative for dysuria, frequency and urgency.  Musculoskeletal:  Negative for arthralgias, back pain and myalgias.  Skin:  Negative for pallor and rash.  Allergic/Immunologic: Negative for environmental allergies.  Neurological:  Negative for dizziness, syncope and headaches.  Hematological:  Negative for adenopathy. Does not bruise/bleed easily.  Psychiatric/Behavioral:  Negative for decreased concentration and dysphoric mood. The patient is nervous/anxious.        Occasionally anxious        Objective:   Physical Exam Constitutional:      General: She is not in acute distress.    Appearance: Normal appearance. She is well-developed and normal weight. She is not ill-appearing or diaphoretic.  HENT:     Head: Normocephalic and atraumatic.     Right Ear: Tympanic membrane, ear canal and external ear normal.     Left Ear: Tympanic membrane, ear canal and external ear normal.     Nose: Nose normal. No congestion.     Mouth/Throat:     Mouth: Mucous membranes are moist.     Pharynx: Oropharynx is clear. No posterior oropharyngeal erythema.  Eyes:     General: No scleral icterus.    Extraocular Movements: Extraocular movements intact.     Conjunctiva/sclera: Conjunctivae normal.     Pupils: Pupils are equal, round, and reactive to light.  Neck:     Thyroid: No thyromegaly.     Vascular: No carotid  bruit or JVD.  Cardiovascular:     Rate and Rhythm: Normal rate and regular rhythm.     Pulses: Normal pulses.     Heart sounds: Normal heart sounds.     No gallop.  Pulmonary:     Effort: Pulmonary effort is normal. No respiratory distress.      Breath sounds: Normal breath sounds. No wheezing.     Comments: Good air exch Chest:     Chest wall: No tenderness.  Abdominal:     General: Bowel sounds are normal. There is no distension or abdominal bruit.     Palpations: Abdomen is soft. There is no mass.     Tenderness: There is no abdominal tenderness.     Hernia: No hernia is present.  Genitourinary:    Comments: Breast exam: No mass, nodules, thickening, tenderness, bulging, retraction, inflamation, nipple discharge or skin changes noted.  No axillary or clavicular LA.     Post cancer treatment changes are noted baseline Dense tissue   Musculoskeletal:        General: No tenderness. Normal range of motion.     Cervical back: Normal range of motion and neck supple. No rigidity. No muscular tenderness.     Right lower leg: No edema.     Left lower leg: No edema.     Comments: No kyphosis   Lymphadenopathy:     Cervical: No cervical adenopathy.  Skin:    General: Skin is warm and dry.     Coloration: Skin is not pale.     Findings: No erythema or rash.     Comments: Solar lentigines diffusely   Neurological:     Mental Status: She is alert. Mental status is at baseline.     Cranial Nerves: No cranial nerve deficit.     Motor: No abnormal muscle tone.     Coordination: Coordination normal.     Gait: Gait normal.     Deep Tendon Reflexes: Reflexes are normal and symmetric. Reflexes normal.  Psychiatric:        Mood and Affect: Mood normal.        Cognition and Memory: Cognition and memory normal.           Assessment & Plan:   Problem List Items Addressed This Visit       Cardiovascular and Mediastinum   Aortic atherosclerosis ([place])    No symptoms Continue to work on HTN and cholesterol control which are stable       Relevant Medications   lisinopril-hydrochlorothiazide (ZESTORETIC) 20-25 MG tablet   Essential hypertension    bp in fair control at this time  BP Readings from Last 1 Encounters:   06/17/22 122/78  No changes needed Most recent labs reviewed  Disc lifstyle change with low sodium diet and exercise  Plan to continue Lisinopril hct 20-25 mg daily  Metoprolol xl 50 mg daily      Relevant Medications   lisinopril-hydrochlorothiazide (ZESTORETIC) 20-25 MG tablet     Other   Anxiety    Doing ok  Takes xanax very infrequently  If she notes needing it more will need to disc another option  Data base rev, last refill was aug      Relevant Medications   ALPRAZolam (XANAX) 0.5 MG tablet   HYPERCHOLESTEROLEMIA, PURE    Disc goals for lipids and reasons to control them Rev last labs with pt Rev low sat fat diet in detail   LDL of 93 Trig down Plan to  continue atorvastatin 10 mg daily       Relevant Medications   lisinopril-hydrochlorothiazide (ZESTORETIC) 20-25 MG tablet   Routine general medical examination at a health care facility - Primary    Reviewed health habits including diet and exercise and skin cancer prevention Reviewed appropriate screening tests for age  Also reviewed health mt list, fam hx and immunization status , as well as social and family history   See HPI Labs reviewed Considering shingrix vaccine  Had flu shot  Mammogram utd 08/2021 in setting of past breast cancer, stable exam Colonoscopy utd 2020  Dexa 06/2020 -normal, taking ca and D and no falls or fx Enc to add more strength training Mood is stable

## 2022-06-17 NOTE — Patient Instructions (Addendum)
Try to exercise regularly  Think about adding more strength training   If you are interested in the new shingles vaccine (Shingrix) - call your local pharmacy to check on coverage and availability  If affordable, get on a wait list at your pharmacy to get the vaccine.

## 2022-06-17 NOTE — Assessment & Plan Note (Signed)
No symptoms Continue to work on HTN and cholesterol control which are stable

## 2022-08-24 LAB — HM MAMMOGRAPHY

## 2022-09-04 ENCOUNTER — Other Ambulatory Visit: Payer: Self-pay | Admitting: Family Medicine

## 2022-09-09 ENCOUNTER — Other Ambulatory Visit: Payer: Self-pay | Admitting: Family Medicine

## 2022-09-20 ENCOUNTER — Other Ambulatory Visit: Payer: Self-pay | Admitting: Family Medicine

## 2022-09-20 NOTE — Telephone Encounter (Signed)
Name of Medication: Xanax Name of Pharmacy: [facility] Last Fill or Written Date and Quantity: 06/17/22 #30 tabs/ 0 refills Last Office Visit and Type: CPE on 06/17/22 Next Office Visit and Type: none scheduled

## 2022-12-03 ENCOUNTER — Other Ambulatory Visit: Payer: Self-pay | Admitting: Family Medicine

## 2022-12-06 NOTE — Telephone Encounter (Signed)
See pharmacy note saying: medication isn't covered

## 2022-12-06 NOTE — Telephone Encounter (Signed)
Please have her get alternative covered med list for me   Highly unusual that this is not covered, is she sure?

## 2022-12-07 NOTE — Telephone Encounter (Signed)
Called pharmacy and verified med is covered. They said they sent this in error and will keep refilling pt's med

## 2023-01-04 ENCOUNTER — Encounter: Payer: Self-pay | Admitting: Nurse Practitioner

## 2023-01-04 ENCOUNTER — Ambulatory Visit (INDEPENDENT_AMBULATORY_CARE_PROVIDER_SITE_OTHER): Payer: Medicare Other | Admitting: Nurse Practitioner

## 2023-01-04 VITALS — BP 132/82 | HR 80 | Temp 98.2°F | Ht 60.25 in | Wt 137.4 lb

## 2023-01-04 DIAGNOSIS — N3091 Cystitis, unspecified with hematuria: Secondary | ICD-10-CM | POA: Diagnosis not present

## 2023-01-04 DIAGNOSIS — R300 Dysuria: Secondary | ICD-10-CM

## 2023-01-04 LAB — POC URINALSYSI DIPSTICK (AUTOMATED)
Bilirubin, UA: NEGATIVE
Blood, UA: POSITIVE
Glucose, UA: NEGATIVE
Ketones, UA: NEGATIVE
Nitrite, UA: POSITIVE
Protein, UA: POSITIVE — AB
Spec Grav, UA: 1.01 (ref 1.010–1.025)
Urobilinogen, UA: 1 E.U./dL
pH, UA: 6.5 (ref 5.0–8.0)

## 2023-01-04 MED ORDER — CEPHALEXIN 500 MG PO CAPS
500.0000 mg | ORAL_CAPSULE | Freq: Two times a day (BID) | ORAL | 0 refills | Status: DC
Start: 2023-01-04 — End: 2023-03-16

## 2023-01-04 NOTE — Assessment & Plan Note (Signed)
UA indicative of a urinary tract infection.  Pending urine culture will treat with Keflex 500 mg twice daily for 7 days.  Patient can use Azo for the following 2 days and then stop to make sure symptoms are improving follow-up if no improvement.  Signs and reviewed with patient is to be reevaluated

## 2023-01-04 NOTE — Assessment & Plan Note (Signed)
UA in office 

## 2023-01-04 NOTE — Progress Notes (Signed)
Acute Office Visit  Subjective:     Patient ID: Jamie Dillon, female    DOB: [birth year], 72 y.o.   MRN: [id number]  Chief Complaint  Patient presents with   Urinary Frequency   Dysuria    Started couple days ago, pt complains of frequent urination and pain in pelvic area. Burning when urinating.      Patient is in today for urinary complaints with a history of HTN, HLD, breast CA, hyserectomy  States that it started a couple days ago and has gotten worse since themn. States that yesterday was the worse day. States that she has taken the AZO. States she is drinking water and cranberry   Does have history of UTIs but the symptoms feel different per her report.  Review of Systems  Constitutional:  Negative for chills and fever.  Gastrointestinal:  Negative for abdominal pain, nausea and vomiting.  Genitourinary:  Positive for dysuria and frequency.  Musculoskeletal:  Negative for back pain.        Objective:    BP 132/82   Pulse 80   Temp 98.2 F (36.8 C) (Temporal)   Ht 5' 0.25" (1.53 m)   Wt 137 lb 6.4 oz (62.3 kg)   SpO2 99%   BMI 26.61 kg/m    Physical Exam Vitals and nursing note reviewed.  Constitutional:      Appearance: Normal appearance.  Cardiovascular:     Rate and Rhythm: Normal rate and regular rhythm.     Heart sounds: Normal heart sounds.  Pulmonary:     Effort: Pulmonary effort is normal.     Breath sounds: Normal breath sounds.  Abdominal:     General: Bowel sounds are normal. There is no distension.     Palpations: There is no mass.     Tenderness: There is no abdominal tenderness. There is no right CVA tenderness or left CVA tenderness.     Hernia: No hernia is present.  Neurological:     Mental Status: She is alert.     Results for orders placed or performed in visit on 01/04/23  POCT Urinalysis Dipstick (Automated)  Result Value Ref Range   Color, UA yellow    Clarity, UA cloudy    Glucose, UA Negative Negative   Bilirubin, UA  neg    Ketones, UA neg    Spec Grav, UA 1.010 1.010 - 1.025   Blood, UA pos    pH, UA 6.5 5.0 - 8.0   Protein, UA Positive (A) Negative   Urobilinogen, UA 1.0 0.2 or 1.0 E.U./dL   Nitrite, UA pos    Leukocytes, UA Large (3+) (A) Negative        Assessment & Plan:   Problem List Items Addressed This Visit       Genitourinary   Cystitis with hematuria    UA indicative of a urinary tract infection.  Pending urine culture will treat with Keflex 500 mg twice daily for 7 days.  Patient can use Azo for the following 2 days and then stop to make sure symptoms are improving follow-up if no improvement.  Signs and reviewed with patient is to be reevaluated      Relevant Medications   cephALEXin (KEFLEX) 500 MG capsule   Other Relevant Orders   Urine Culture     Other   Dysuria - Primary    UA in office      Relevant Orders   POCT Urinalysis Dipstick (Automated) (Completed)    Meds ordered  this encounter  Medications   cephALEXin (KEFLEX) 500 MG capsule    Sig: Take 1 capsule (500 mg total) by mouth 2 (two) times daily.    Dispense:  14 capsule    Refill:  0    Order Specific Question:   Supervising Provider    Answer:   TOWER, MARNE A [[id number]    Return if symptoms worsen or fail to improve.  Audria Nine, NP

## 2023-01-04 NOTE — Patient Instructions (Signed)
Nice to see you today I have sent an antibiotic into the pharmacy You can take the AZO for an additional 2 days then stop Drink plenty of fluid like you are doing Follow up if you do not improve  I will be in touch with the urine culture results once I have them (3-5 days)

## 2023-01-06 LAB — URINE CULTURE
MICRO NUMBER:: 15362103
SPECIMEN QUALITY:: ADEQUATE

## 2023-01-09 ENCOUNTER — Other Ambulatory Visit: Payer: Self-pay | Admitting: Family Medicine

## 2023-01-10 NOTE — Telephone Encounter (Signed)
Name of Medication: Xanax Name of Pharmacy: [facility] Last Fill or Written Date and Quantity: 09/20/22 #30 tabs/ 0 refills Last Office Visit and Type: CPE on 06/17/22 Next Office Visit and Type: CPE on 06/20/23

## 2023-03-16 ENCOUNTER — Encounter: Payer: Self-pay | Admitting: Primary Care

## 2023-03-16 ENCOUNTER — Ambulatory Visit (INDEPENDENT_AMBULATORY_CARE_PROVIDER_SITE_OTHER): Payer: Medicare Other | Admitting: Primary Care

## 2023-03-16 VITALS — BP 142/86 | HR 82 | Temp 98.4°F | Ht 60.25 in | Wt 138.0 lb

## 2023-03-16 DIAGNOSIS — R300 Dysuria: Secondary | ICD-10-CM

## 2023-03-16 LAB — POC URINALSYSI DIPSTICK (AUTOMATED)
Bilirubin, UA: NEGATIVE
Blood, UA: POSITIVE
Glucose, UA: NEGATIVE
Ketones, UA: NEGATIVE
Nitrite, UA: NEGATIVE
Protein, UA: POSITIVE — AB
Spec Grav, UA: 1.01 (ref 1.010–1.025)
Urobilinogen, UA: 0.2 U/dL
pH, UA: 7 (ref 5.0–8.0)

## 2023-03-16 MED ORDER — CEPHALEXIN 500 MG PO CAPS
500.0000 mg | ORAL_CAPSULE | Freq: Two times a day (BID) | ORAL | 0 refills | Status: DC
Start: 2023-03-16 — End: 2023-06-20

## 2023-03-16 NOTE — Progress Notes (Signed)
Subjective:    Patient ID: Fleet Contras, female    DOB: March 30, 1951, 72 y.o.   MRN: [id number]  Dysuria  Associated symptoms include frequency. Pertinent negatives include no hematuria.    Aminah Liller is a very pleasant 72 y.o. female patient of Dr. Milinda Antis with a history of hypertension, cystitis with hematuria, tobacco abuse, breast cancer, dysuria who presents today to discuss dysuria.  Symptom onset 3 days ago with dysuria and increased urinary frequency. She's also noticed urinary urgency and nocturia.   She denies hematuria, flank pain, vaginal discharge, fevers abdominal pain. She drinks little water during the day. She mostly drinks coffee and Ginger Ale during the day.   Her last episode of cystitis was in August of 2024, culture positive with 50,000-100,000 colonies of E coli without resistance. Symptoms post treatment with 7 day course of cephalexin 500 mg resolved.    Review of Systems  Constitutional:  Negative for fever.  Genitourinary:  Positive for dysuria and frequency. Negative for hematuria and vaginal discharge.         Past Medical History:  Diagnosis Date   Allergic rhinitis    Allergy    Anxiety    Arthritis    back, right hand, ? knees    Breast cancer of lower-outer quadrant of right female breast (HCC) 06/19/2014   Cataract 03/2018   bilateral eyes   Disorder of bone and cartilage, unspecified    Family history of malignant neoplasm of breast    Family history of other kidney diseases    GERD (gastroesophageal reflux disease)    History of chemotherapy 2016   History of radiation therapy 2016   HLD (hyperlipidemia)    HTN (hypertension)    PONV (postoperative nausea and vomiting)    Tobacco abuse     Social History   Socioeconomic History   Marital status: Married    Spouse name: Not on file   Number of children: 2   Years of education: Not on file   Highest education level: Not on file  Occupational History   Not on file  Tobacco Use    Smoking status: Former    Current packs/day: 0.00    Types: Cigarettes    Quit date: 06/25/2010    Years since quitting: 12.7   Smokeless tobacco: Never  Vaping Use   Vaping status: Never Used  Substance and Sexual Activity   Alcohol use: Yes    Alcohol/week: 0.0 standard drinks of alcohol    Comment: rarely wine/ 2x per year   Drug use: No   Sexual activity: Not on file  Other Topics Concern   Not on file  Social History Narrative   Gym and walking for exercise      Cares for elderly mother and sister      married   Social Determinants of Health   Financial Resource Strain: Low Risk  (06/16/2022)   Overall Financial Resource Strain (CARDIA)    Difficulty of Paying Living Expenses: Not hard at all  Food Insecurity: No Food Insecurity (06/16/2022)   Hunger Vital Sign    Worried About Running Out of Food in the Last Year: Never true    Ran Out of Food in the Last Year: Never true  Transportation Needs: No Transportation Needs (06/16/2022)   PRAPARE - Administrator, Civil Service (Medical): No    Lack of Transportation (Non-Medical): No  Physical Activity: Insufficiently Active (06/16/2022)   Exercise Vital Sign    Days  of Exercise per Week: 3 days    Minutes of Exercise per Session: 30 min  Stress: No Stress Concern Present (06/16/2022)   [employer] of Occupational Health - Occupational Stress Questionnaire    Feeling of Stress : Only a little  Social Connections: Moderately Integrated (06/16/2022)   Social Connection and Isolation Panel [NHANES]    Frequency of Communication with Friends and Family: More than three times a week    Frequency of Social Gatherings with Friends and Family: Never    Attends Religious Services: More than 4 times per year    Active Member of Clubs or Organizations: No    Attends Banker Meetings: Never    Marital Status: Married  Catering manager Violence: Not At Risk (06/16/2022)   Humiliation, Afraid, Rape, and Kick  questionnaire    Fear of Current or Ex-Partner: No    Emotionally Abused: No    Physically Abused: No    Sexually Abused: No    Past Surgical History:  Procedure Laterality Date   ABDOMINAL HYSTERECTOMY     APPENDECTOMY     BREAST LUMPECTOMY WITH NEEDLE LOCALIZATION AND AXILLARY SENTINEL LYMPH NODE BX Right 07/21/2014   Procedure: BREAST LUMPECTOMY WITH NEEDLE LOCALIZATION AND AXILLARY SENTINEL LYMPH NODE BIOPSY;  Surgeon: Emelia Loron, MD;  Location: [facility] OR;  Service: General;  Laterality: Right;   COLONOSCOPY     DILATION AND CURETTAGE OF UTERUS     PORT-A-CATH REMOVAL N/A 03/12/2015   Procedure: REMOVAL PORT-A-CATH;  Surgeon: Emelia Loron, MD;  Location: [facility];  Service: General;  Laterality: N/A;   PORTACATH PLACEMENT Right 09/10/2014   Procedure: INSERTION PORT-A-CATH;  Surgeon: Emelia Loron, MD;  Location:  SURGERY CENTER;  Service: General;  Laterality: Right;   TOTAL VAGINAL HYSTERECTOMY  1996   endometriosis    Family History  Problem Relation Age of Onset   Hypertension Mother    Kidney disease Father    Breast cancer Sister 48   Diabetes Paternal Aunt    Colon cancer Neg Hx    Colon polyps Neg Hx    Esophageal cancer Neg Hx    Stomach cancer Neg Hx    Rectal cancer Neg Hx     Allergies  Allergen Reactions   Sulfa Antibiotics Swelling    Tongue and lips swell up.    Dust Mite Extract    Pollen Extract     Current Outpatient Medications on File Prior to Visit  Medication Sig Dispense Refill   ALPRAZolam (XANAX) 0.5 MG tablet TAKE 1 TABLET BY MOUTH EVERY DAY AS NEEDED 30 tablet 0   aspirin 81 MG chewable tablet Chew 81 mg by mouth daily.     atorvastatin (LIPITOR) 10 MG tablet TAKE 1 TABLET BY MOUTH EVERY DAY 90 tablet 2   Calcium Carbonate-Vitamin D (CALCIUM-VITAMIN D) 500-200 MG-UNIT per tablet Take 3 tablets by mouth every morning.     diphenhydrAMINE (BENADRYL) 25 MG tablet Take 25 mg by mouth daily as needed for  allergies.     Docusate Calcium (STOOL SOFTENER PO) Take 2 capsules by mouth daily.     Glucosamine-Chondroit-Vit C-Mn (GLUCOSAMINE 1500 COMPLEX) CAPS Take by mouth.     lisinopril-hydrochlorothiazide (ZESTORETIC) 20-25 MG tablet Take 1 tablet by mouth daily. 90 tablet 3   metoprolol succinate (TOPROL-XL) 50 MG 24 hr tablet TAKE 1 TABLET EVERY DAY WITH OR IMMEDIATELY FOLLOWING A MEAL 90 tablet 2   Multiple Vitamin (MULTIVITAMIN) tablet Take 1 tablet by mouth daily.  b complex vitamins tablet Take 1 tablet by mouth daily. (Patient not taking: Reported on 01/04/2023)     No current facility-administered medications on file prior to visit.    BP (!) 142/86   Pulse 82   Temp 98.4 F (36.9 C) (Temporal)   Ht 5' 0.25" (1.53 m)   Wt 138 lb (62.6 kg)   SpO2 97%   BMI 26.73 kg/m  Objective:   Physical Exam Constitutional:      Appearance: She is not ill-appearing.  HENT:     Right Ear: Tympanic membrane and ear canal normal.     Left Ear: Tympanic membrane and ear canal normal.     Nose: No mucosal edema.     Right Sinus: No maxillary sinus tenderness or frontal sinus tenderness.     Left Sinus: No maxillary sinus tenderness or frontal sinus tenderness.     Mouth/Throat:     Mouth: Mucous membranes are moist.  Eyes:     Conjunctiva/sclera: Conjunctivae normal.  Cardiovascular:     Rate and Rhythm: Normal rate and regular rhythm.  Pulmonary:     Effort: Pulmonary effort is normal.  Abdominal:     Palpations: Abdomen is soft.     Tenderness: There is no abdominal tenderness. There is no right CVA tenderness or left CVA tenderness.  Musculoskeletal:     Cervical back: Neck supple.  Skin:    General: Skin is warm and dry.           Assessment & Plan:  Dysuria Assessment & Plan: Symptoms suggestive of cystitis.  Urinalysis today with 2+ leuks, trace blood, no nitrites. Culture ordered and pending.  Given prior history, coupled with urinalysis findings, we will treat  for presumed bacterial involvement.  Start cephalexin 500 mg twice daily x 7 days.  We also discussed to increase water intake. We discussed common causes for cystitis in women including dehydration, vaginal dryness secondary to menopause, urinary continence, constipation.  Follow-up as needed.  Orders: -     POCT Urinalysis Dipstick (Automated) -     Urine Culture -     Cephalexin; Take 1 capsule (500 mg total) by mouth 2 (two) times daily.  Dispense: 14 capsule; Refill: 0        Doreene Nest, NP

## 2023-03-16 NOTE — Assessment & Plan Note (Signed)
Symptoms suggestive of cystitis.  Urinalysis today with 2+ leuks, trace blood, no nitrites. Culture ordered and pending.  Given prior history, coupled with urinalysis findings, we will treat for presumed bacterial involvement.  Start cephalexin 500 mg twice daily x 7 days.  We also discussed to increase water intake. We discussed common causes for cystitis in women including dehydration, vaginal dryness secondary to menopause, urinary continence, constipation.  Follow-up as needed.

## 2023-03-16 NOTE — Patient Instructions (Addendum)
Start cephalexin antibiotics.  Take 1 tablet by mouth twice daily for 7 days.  Ensure you are consuming 64 ounces of water daily.  We will be in touch with your urine culture results.  It was a pleasure meeting you!

## 2023-03-18 LAB — URINE CULTURE
MICRO NUMBER:: 15669984
SPECIMEN QUALITY:: ADEQUATE

## 2023-05-08 ENCOUNTER — Other Ambulatory Visit: Payer: Self-pay | Admitting: Family Medicine

## 2023-05-09 NOTE — Telephone Encounter (Signed)
Name of Medication: Xanax Name of Pharmacy: [facility] Last Fill or Written Date and Quantity: 01/10/23 #30 tabs/ 0 refills Last Office Visit and Type: CPE on 06/17/22 Next Office Visit and Type: CPE on 06/20/23

## 2023-05-24 ENCOUNTER — Other Ambulatory Visit: Payer: Self-pay | Admitting: Family Medicine

## 2023-05-26 ENCOUNTER — Other Ambulatory Visit: Payer: Self-pay | Admitting: Family Medicine

## 2023-06-11 ENCOUNTER — Telehealth: Payer: Self-pay | Admitting: Family Medicine

## 2023-06-11 DIAGNOSIS — I10 Essential (primary) hypertension: Secondary | ICD-10-CM

## 2023-06-11 DIAGNOSIS — E7800 Pure hypercholesterolemia, unspecified: Secondary | ICD-10-CM

## 2023-06-11 NOTE — Telephone Encounter (Signed)
-----   Message from Alvina Chou sent at 05/25/2023  2:44 PM EST ----- Regarding: Lab orders for Tue, 1.28.25 Patient is scheduled for CPX labs, please order future labs, Thanks , Camelia Eng

## 2023-06-13 ENCOUNTER — Other Ambulatory Visit (INDEPENDENT_AMBULATORY_CARE_PROVIDER_SITE_OTHER): Payer: Medicare Other

## 2023-06-13 DIAGNOSIS — E7800 Pure hypercholesterolemia, unspecified: Secondary | ICD-10-CM

## 2023-06-13 DIAGNOSIS — I10 Essential (primary) hypertension: Secondary | ICD-10-CM | POA: Diagnosis not present

## 2023-06-13 LAB — COMPREHENSIVE METABOLIC PANEL
ALT: 17 U/L (ref 0–35)
AST: 19 U/L (ref 0–37)
Albumin: 4.6 g/dL (ref 3.5–5.2)
Alkaline Phosphatase: 56 U/L (ref 39–117)
BUN: 14 mg/dL (ref 6–23)
CO2: 31 meq/L (ref 19–32)
Calcium: 9.9 mg/dL (ref 8.4–10.5)
Chloride: 102 meq/L (ref 96–112)
Creatinine, Ser: 0.92 mg/dL (ref 0.40–1.20)
GFR: 61.45 mL/min (ref >60.00)
Glucose, Bld: 94 mg/dL (ref 70–99)
Potassium: 3.8 meq/L (ref 3.5–5.1)
Sodium: 141 meq/L (ref 135–145)
Total Bilirubin: 0.5 mg/dL (ref 0.2–1.2)
Total Protein: 7.2 g/dL (ref 6.0–8.3)

## 2023-06-13 LAB — CBC WITH DIFFERENTIAL/PLATELET
Basophils Absolute: 0.1 10*3/uL (ref 0.0–0.1)
Basophils Relative: 1.1 % (ref 0.0–3.0)
Eosinophils Absolute: 0.3 10*3/uL (ref 0.0–0.7)
Eosinophils Relative: 3.9 % (ref 0.0–5.0)
HCT: 39.2 % (ref 36.0–46.0)
Hemoglobin: 13.4 g/dL (ref 12.0–15.0)
Lymphocytes Relative: 21.9 % (ref 12.0–46.0)
Lymphs Abs: 1.9 10*3/uL (ref 0.7–4.0)
MCHC: 34.1 g/dL (ref 30.0–36.0)
MCV: 95.1 fL (ref 78.0–100.0)
Monocytes Absolute: 0.8 10*3/uL (ref 0.1–1.0)
Monocytes Relative: 9.5 % (ref 3.0–12.0)
Neutro Abs: 5.6 10*3/uL (ref 1.4–7.7)
Neutrophils Relative %: 63.6 % (ref 43.0–77.0)
Platelets: 265 10*3/uL (ref 150.0–400.0)
RBC: 4.12 Mil/uL (ref 3.87–5.11)
RDW: 12.9 % (ref 11.5–15.5)
WBC: 8.8 10*3/uL (ref 4.0–10.5)

## 2023-06-13 LAB — LIPID PANEL
Cholesterol: 180 mg/dL (ref 0–200)
HDL: 46.8 mg/dL (ref >39.00)
LDL Cholesterol: 84 mg/dL (ref 0–99)
NonHDL: 133.69
Total CHOL/HDL Ratio: 4
Triglycerides: 250 mg/dL — ABNORMAL HIGH (ref 0.0–149.0)
VLDL: 50 mg/dL — ABNORMAL HIGH (ref 0.0–40.0)

## 2023-06-13 LAB — TSH: TSH: 2.24 u[IU]/mL (ref 0.35–5.50)

## 2023-06-20 ENCOUNTER — Ambulatory Visit (INDEPENDENT_AMBULATORY_CARE_PROVIDER_SITE_OTHER): Payer: Medicare Other

## 2023-06-20 ENCOUNTER — Ambulatory Visit (INDEPENDENT_AMBULATORY_CARE_PROVIDER_SITE_OTHER): Payer: Medicare Other | Admitting: Family Medicine

## 2023-06-20 ENCOUNTER — Encounter: Payer: Self-pay | Admitting: Family Medicine

## 2023-06-20 VITALS — BP 104/66 | HR 77 | Temp 98.3°F | Ht 60.25 in | Wt 136.2 lb

## 2023-06-20 VITALS — Ht 60.25 in | Wt 138.0 lb

## 2023-06-20 DIAGNOSIS — E7800 Pure hypercholesterolemia, unspecified: Secondary | ICD-10-CM

## 2023-06-20 DIAGNOSIS — Z0000 Encounter for general adult medical examination without abnormal findings: Secondary | ICD-10-CM | POA: Diagnosis not present

## 2023-06-20 DIAGNOSIS — I10 Essential (primary) hypertension: Secondary | ICD-10-CM | POA: Diagnosis not present

## 2023-06-20 DIAGNOSIS — J309 Allergic rhinitis, unspecified: Secondary | ICD-10-CM

## 2023-06-20 DIAGNOSIS — I700 Atherosclerosis of aorta: Secondary | ICD-10-CM | POA: Diagnosis not present

## 2023-06-20 DIAGNOSIS — F419 Anxiety disorder, unspecified: Secondary | ICD-10-CM

## 2023-06-20 MED ORDER — IPRATROPIUM BROMIDE 0.03 % NA SOLN: 2.0000 | Freq: Two times a day (BID) | NASAL | 5 refills | Status: DC

## 2023-06-20 NOTE — Progress Notes (Signed)
 Subjective:   Jamie Dillon is a 73 y.o. female who presents for Medicare Annual (Subsequent) preventive examination.  Visit Complete: Virtual I connected with  Jamie Dillon on 06/20/23 by a audio enabled telemedicine application and verified that I am speaking with the correct person using two identifiers.  Patient Location: Home  Provider Location: Office/Clinic  I discussed the limitations of evaluation and management by telemedicine. The patient expressed understanding and agreed to proceed.  Vital Signs: Because this visit was a virtual/telehealth visit, some criteria may be missing or patient reported. Any vitals not documented were not able to be obtained and vitals that have been documented are patient reported.  Patient Medicare AWV questionnaire was completed by the patient on 06/13/2023; I have confirmed that all information answered by patient is correct and no changes since this date.  Cardiac Risk Factors include: advanced age (>42men, >73 women);dyslipidemia;hypertension    Objective:    Today's Vitals   06/20/23 0813  Weight: 138 lb (62.6 kg)  Height: 5' 0.25 (1.53 m)   Body mass index is 26.73 kg/m.     06/20/2023    8:26 AM 06/16/2022    9:22 AM 05/20/2019   11:18 AM 05/17/2018   10:14 AM 06/15/2016   10:51 AM 02/23/2016    2:19 PM 03/12/2015    6:47 AM  Advanced Directives  Does Patient Have a Medical Advance Directive? Yes Yes Yes Yes Yes Yes Yes  Type of Estate Agent of [employer] Power of [place];Living will Healthcare Power of [place];Living will Healthcare Power of [employer] of [place];Living will  Living will  Does patient want to make changes to medical advance directive?       No - Patient declined  Copy of Healthcare Power of Attorney in Chart? No - copy requested No - copy requested No - copy requested No - copy requested No - copy requested  No - copy requested    Current Medications  (verified) Outpatient Encounter Medications as of 06/20/2023  Medication Sig   ALPRAZolam  (XANAX ) 0.5 MG tablet TAKE 1 TABLET BY MOUTH EVERY DAY AS NEEDED   aspirin 81 MG chewable tablet Chew 81 mg by mouth daily.   atorvastatin  (LIPITOR) 10 MG tablet TAKE 1 TABLET BY MOUTH EVERY DAY   b complex vitamins tablet Take 1 tablet by mouth daily. (Patient not taking: Reported on 01/04/2023)   Calcium  Carbonate-Vitamin D  (CALCIUM -VITAMIN D ) 500-200 MG-UNIT per tablet Take 3 tablets by mouth every morning.   cephALEXin  (KEFLEX ) 500 MG capsule Take 1 capsule (500 mg total) by mouth 2 (two) times daily.   diphenhydrAMINE (BENADRYL) 25 MG tablet Take 25 mg by mouth daily as needed for allergies.   Docusate Calcium  (STOOL SOFTENER PO) Take 2 capsules by mouth daily.   Glucosamine-Chondroit-Vit C-Mn (GLUCOSAMINE 1500 COMPLEX) CAPS Take by mouth.   lisinopril -hydrochlorothiazide  (ZESTORETIC ) 20-25 MG tablet TAKE 1 TABLET BY MOUTH EVERY DAY   metoprolol  succinate (TOPROL -XL) 50 MG 24 hr tablet TAKE 1 TABLET EVERY DAY WITH OR IMMEDIATELY FOLLOWING A MEAL   Multiple Vitamin (MULTIVITAMIN) tablet Take 1 tablet by mouth daily.   No facility-administered encounter medications on file as of 06/20/2023.    Allergies (verified) Sulfa  antibiotics, Dust mite extract, and Pollen extract   History: Past Medical History:  Diagnosis Date   Allergic rhinitis    Allergy    Anxiety    Arthritis    back, right hand, ? knees    Breast cancer of lower-outer quadrant  of right female breast (HCC) 06/19/2014   Cataract 03/2018   bilateral eyes   Disorder of bone and cartilage, unspecified    Family history of malignant neoplasm of breast    Family history of other kidney diseases    GERD (gastroesophageal reflux disease)    History of chemotherapy 2016   History of radiation therapy 2016   HLD (hyperlipidemia)    HTN (hypertension)    PONV (postoperative nausea and vomiting)    Tobacco abuse    Past Surgical History:   Procedure Laterality Date   ABDOMINAL HYSTERECTOMY     APPENDECTOMY     BREAST LUMPECTOMY WITH NEEDLE LOCALIZATION AND AXILLARY SENTINEL LYMPH NODE BX Right 07/21/2014   Procedure: BREAST LUMPECTOMY WITH NEEDLE LOCALIZATION AND AXILLARY SENTINEL LYMPH NODE BIOPSY;  Surgeon: Donnice Bury, MD;  Location: [facility];  Service: General;  Laterality: Right;   COLONOSCOPY     DILATION AND CURETTAGE OF UTERUS     PORT-A-CATH REMOVAL N/A 03/12/2015   Procedure: REMOVAL PORT-A-CATH;  Surgeon: Donnice Bury, MD;  Location: [facility];  Service: General;  Laterality: N/A;   PORTACATH PLACEMENT Right 09/10/2014   Procedure: INSERTION PORT-A-CATH;  Surgeon: Donnice Bury, MD;  Location: [facility];  Service: General;  Laterality: Right;   TOTAL VAGINAL HYSTERECTOMY  1996   endometriosis   Family History  Problem Relation Age of Onset   Hypertension Mother    Kidney disease Father    Breast cancer Sister 44   Diabetes Paternal Aunt    Colon cancer Neg Hx    Colon polyps Neg Hx    Esophageal cancer Neg Hx    Stomach cancer Neg Hx    Rectal cancer Neg Hx    Social History   Socioeconomic History   Marital status: Married    Spouse name: Not on file   Number of children: 2   Years of education: Not on file   Highest education level: 12th grade  Occupational History   Not on file  Tobacco Use   Smoking status: Former    Current packs/day: 0.00    Types: Cigarettes    Quit date: 06/25/2010    Years since quitting: 12.9   Smokeless tobacco: Never  Vaping Use   Vaping status: Never Used  Substance and Sexual Activity   Alcohol use: Yes    Alcohol/week: 0.0 standard drinks of alcohol    Comment: rarely wine/ 2x per year   Drug use: No   Sexual activity: Not on file  Other Topics Concern   Not on file  Social History Narrative   Gym and walking for exercise      Cares for elderly mother and sister      married   Social Drivers of Manufacturing Engineer Strain: Low Risk  (06/20/2023)   Overall Financial Resource Strain (CARDIA)    Difficulty of Paying Living Expenses: Not hard at all  Food Insecurity: No Food Insecurity (06/20/2023)   Hunger Vital Sign    Worried About Running Out of Food in the Last Year: Never true    Ran Out of Food in the Last Year: Never true  Transportation Needs: No Transportation Needs (06/20/2023)   PRAPARE - Administrator, Civil Service (Medical): No    Lack of Transportation (Non-Medical): No  Physical Activity: Unknown (06/20/2023)   Exercise Vital Sign    Days of Exercise per Week: Patient declined    Minutes of Exercise  per Session: 30 min  Stress: Stress Concern Present (06/20/2023)   [employer] of Occupational Health - Occupational Stress Questionnaire    Feeling of Stress : To some extent  Social Connections: Socially Integrated (06/20/2023)   Social Connection and Isolation Panel [NHANES]    Frequency of Communication with Friends and Family: More than three times a week    Frequency of Social Gatherings with Friends and Family: Three times a week    Attends Religious Services: More than 4 times per year    Active Member of Clubs or Organizations: Yes    Attends Engineer, Structural: More than 4 times per year    Marital Status: Married    Tobacco Counseling Counseling given: Not Answered   Clinical Intake:  Pre-visit preparation completed: Yes  Pain : No/denies pain    BMI - recorded: 26.73 Nutritional Status: BMI 25 -29 Overweight Nutritional Risks: None Diabetes: No  How often do you need to have someone help you when you read instructions, pamphlets, or other written materials from your doctor or pharmacy?: 1 - Never  Interpreter Needed?: No  Comments: lives with husband Information entered by :: B.Gavriela Cashin,LPN   Activities of Daily Living    06/13/2023    9:45 AM  In your present state of health, do you have any difficulty performing  the following activities:  Hearing? 0  Vision? 0  Difficulty concentrating or making decisions? 0  Walking or climbing stairs? 0  Dressing or bathing? 0  Doing errands, shopping? 0  Preparing Food and eating ? N  Using the Toilet? N  In the past six months, have you accidently leaked urine? N  Do you have problems with loss of bowel control? N  Managing your Medications? N  Managing your Finances? N  Housekeeping or managing your Housekeeping? N    Patient Care Team: Tower, Laine LABOR, MD as PCP - General Ebbie Cough, MD as Consulting Physician (General Surgery) Magrinat, Sandria BROCKS, MD (Inactive) as Consulting Physician (Oncology) Izell Domino, MD as Attending Physician (Radiation Oncology) Tyree Nanetta SAILOR, RN as Registered Nurse Glean Stephane BROCKS, RN as Registered Nurse Letha Truman ORN, NP (Inactive) as Nurse Practitioner (Nurse Practitioner) Moses Powell Hummer, NP as Nurse Practitioner (Nurse Practitioner)  Indicate any recent Medical Services you may have received from other than [facility] providers in the past year (date may be approximate).     Assessment:   This is a routine wellness examination for Jamie Dillon.  Hearing/Vision screen Hearing Screening - Comments:: Pt says her hearing is pretty good Vision Screening - Comments:: Pt says she uses readers only;her glasses do not help Dr Mauricio April 25   Goals Addressed               This Visit's Progress     Increase physical activity   On track     06/20/23-When tolerated and weather permitting, I will resume walking 30 min 5-7 days per week.      Patient Stated   Not on track     05/20/2019, I will try to increase my exercise on a daily basis.       COMPLETED: Patient Stated (pt-stated)        Exercise more.       Depression Screen    06/20/2023    8:21 AM 01/04/2023   10:44 AM 06/16/2022    9:18 AM 03/30/2022   12:19 PM 06/15/2021    8:52 AM 06/11/2020    9:24 AM 05/20/2019  11:20 AM  PHQ 2/9 Scores   PHQ - 2 Score 1 2 0 0 0 0 0  PHQ- 9 Score  7     0    Fall Risk    06/13/2023    9:45 AM 03/16/2023   10:39 AM 01/04/2023   10:44 AM 06/16/2022    9:24 AM 03/30/2022   12:19 PM  Fall Risk   Falls in the past year? 0 0 0 0 0  Number falls in past yr:  0 0 0 0  Injury with Fall? 0 0 0 0 0  Risk for fall due to : No Fall Risks No Fall Risks No Fall Risks No Fall Risks No Fall Risks  Follow up Education provided;Falls prevention discussed Falls evaluation completed Falls evaluation completed Falls prevention discussed;Falls evaluation completed Falls evaluation completed    MEDICARE RISK AT HOME: Medicare Risk at Home Any stairs in or around the home?: (Patient-Rptd) No If so, are there any without handrails?: (Patient-Rptd) No Home free of loose throw rugs in walkways, pet beds, electrical cords, etc?: (Patient-Rptd) Yes Adequate lighting in your home to reduce risk of falls?: (Patient-Rptd) Yes Life alert?: (Patient-Rptd) No Use of a cane, walker or w/c?: (Patient-Rptd) No Grab bars in the bathroom?: (Patient-Rptd) Yes Shower chair or bench in shower?: (Patient-Rptd) No Elevated toilet seat or a handicapped toilet?: (Patient-Rptd) Yes  TIMED UP AND GO:  Was the test performed?  No    Cognitive Function:    05/20/2019   11:21 AM 05/17/2018   10:07 AM 06/15/2016   10:40 AM  MMSE - Mini Mental State Exam  Orientation to time 5 5 5   Orientation to Place 5 5 5   Registration 3 3 3   Attention/ Calculation 5 0 0  Recall 3 3 3   Language- name 2 objects  0 0  Language- repeat 1 1 1   Language- follow 3 step command  3 3  Language- read & follow direction  0 0  Write a sentence  0 0  Copy design  0 0  Total score  20 20        06/20/2023    8:30 AM 06/16/2022    9:27 AM  6CIT Screen  What Year? 0 points 0 points  What month? 0 points 0 points  What time? 0 points 0 points  Count back from 20 0 points 0 points  Months in reverse 0 points 0 points  Repeat phrase 0 points 0  points  Total Score 0 points 0 points    Immunizations Immunization History  Administered Date(s) Administered   Fluad Quad(high Dose 65+) 01/23/2019   Fluad Trivalent(High Dose 65+) 02/10/2023   Influenza Split 03/04/2011   Influenza Whole 02/13/2008, 02/16/2009, 03/01/2010   Influenza, High Dose Seasonal PF 03/12/2015, 01/26/2016, 02/10/2017, 01/26/2018, 02/10/2020, 01/26/2021, 02/13/2022   Influenza-Unspecified 02/14/2014   PFIZER Comirnaty(Gray Top)Covid-19 Tri-Sucrose Vaccine 08/17/2020   PFIZER(Purple Top)SARS-COV-2 Vaccination 06/07/2019, 06/28/2019, 02/29/2020   Pfizer Covid-19 Vaccine Bivalent Booster 61yrs & up 02/10/2021   Pneumococcal Conjugate-13 04/28/2015   Pneumococcal Polysaccharide-23 02/13/2008, 04/25/2014   Td 10/04/2001   Tdap 03/04/2011, 08/11/2021   Zoster Recombinant(Shingrix) 04/07/2023    TDAP status: Up to date  Flu Vaccine status: Up to date  Pneumococcal vaccine status: Up to date  Covid-19 vaccine status: Completed vaccines  Qualifies for Shingles Vaccine? Yes   Zostavax completed No  #1 Shingrix Completed?: No.    Education has been provided regarding the importance of this vaccine. Patient has been  advised to call insurance company to determine out of pocket expense if they have not yet received this vaccine. Advised may also receive vaccine at local pharmacy or Health Dept. Verbalized acceptance and understanding.  Screening Tests Health Maintenance  Topic Date Due   COVID-19 Vaccine (6 - 2024-25 season) 01/15/2023   Zoster Vaccines- Shingrix (2 of 2) 06/02/2023   MAMMOGRAM  08/24/2023   Medicare Annual Wellness (AWV)  06/19/2024   Colonoscopy  04/21/2029   DTaP/Tdap/Td (4 - Td or Tdap) 08/12/2031   Pneumonia Vaccine 55+ Years old  Completed   INFLUENZA VACCINE  Completed   DEXA SCAN  Completed   Hepatitis C Screening  Completed   HPV VACCINES  Aged Out    Health Maintenance  Health Maintenance Due  Topic Date Due   COVID-19  Vaccine (6 - 2024-25 season) 01/15/2023   Zoster Vaccines- Shingrix (2 of 2) 06/02/2023    Colorectal cancer screening: Type of screening: Colonoscopy. Completed 04/22/2019. Repeat every 10 years  Mammogram status: Completed 08/24/22. Repeat every year  Bone Density status: Completed 07/01/2020. Results reflect: Bone density results: NORMAL. Repeat every 5 years.  Lung Cancer Screening: (Low Dose CT Chest recommended if Age 7-80 years, 20 pack-year currently smoking OR have quit w/in 15years.) does not qualify.   Lung Cancer Screening Referral: no  Additional Screening:  Hepatitis C Screening: does not qualify; Completed 06/15/2016  Vision Screening: Recommended annual ophthalmology exams for early detection of glaucoma and other disorders of the eye. Is the patient up to date with their annual eye exam?  Yes  Who is the provider or what is the name of the office in which the patient attends annual eye exams? Dr Waylan If pt is not established with a provider, would they like to be referred to a provider to establish care? No .   Dental Screening: Recommended annual dental exams for proper oral hygiene  Diabetic Foot Exam: n/a  Community Resource Referral / Chronic Care Management: CRR required this visit?  No   CCM required this visit?  No    Plan:     I have personally reviewed and noted the following in the patient's chart:   Medical and social history Use of alcohol, tobacco or illicit drugs  Current medications and supplements including opioid prescriptions. Patient is not currently taking opioid prescriptions. Functional ability and status Nutritional status Physical activity Advanced directives List of other physicians Hospitalizations, surgeries, and ER visits in previous 12 months Vitals Screenings to include cognitive, depression, and falls Referrals and appointments  In addition, I have reviewed and discussed with patient certain preventive protocols, quality  metrics, and best practice recommendations. A written personalized care plan for preventive services as well as general preventive health recommendations were provided to patient.    Erminio LITTIE Saris, LPN   11/17/7972   After Visit Summary: (MyChart) Due to this being a telephonic visit, the after visit summary with patients personalized plan was offered to patient via MyChart   Nurse Notes: Pt says she is doing alright other than having some sinus issues , in which she says she will discuss with PCP at visit today.

## 2023-06-20 NOTE — Assessment & Plan Note (Signed)
Reviewed health habits including diet and exercise and skin cancer prevention Reviewed appropriate screening tests for age  Also reviewed health mt list, fam hx and immunization status , as well as social and family history   See HPI Labs reviewed and ordered Health Maintenance  Topic Date Due   Zoster (Shingles) Vaccine (2 of 2) 06/02/2023   COVID-19 Vaccine (6 - 2024-25 season) 07/05/2024*   Mammogram  08/24/2023   Medicare Annual Wellness Visit  06/19/2024   Colon Cancer Screening  04/21/2029   DTaP/Tdap/Td vaccine (4 - Td or Tdap) 08/12/2031   Pneumonia Vaccine  Completed   Flu Shot  Completed   DEXA scan (bone density measurement)  Completed   Hepatitis C Screening  Completed   HPV Vaccine  Aged Out  *Topic was postponed. The date shown is not the original due date.   Amw was today  Getting 2nd shingrix vaccine tomorrow  Normal bmd dexa 01/2023  Discussed fall prevention, supplements and exercise for bone density  PHQ 2 Utd derm screening and care/uses sun protection

## 2023-06-20 NOTE — Assessment & Plan Note (Signed)
Disc goals for lipids and reasons to control them Rev last labs with pt Rev low sat fat diet in detail   LDL is 84 /improved Trig up at 250 despite better diet  Will watch this  No glucose problems   Continue atorvastatin 10 mg daily

## 2023-06-20 NOTE — Assessment & Plan Note (Signed)
 bp in fair control at this time  BP Readings from Last 1 Encounters:  06/20/23 104/66   No changes needed Most recent labs reviewed  Disc lifstyle change with low sodium diet and exercise  Plan to continue Lisinopril  hct 20-25 mg daily  Metoprolol  xl 50 mg daily

## 2023-06-20 NOTE — Assessment & Plan Note (Signed)
May need to get back to allergist (took allergy immunotx in past before her breast cancer) Using budesonide ns  Sent atrovent ns to pharm to try  Call back and Er precautions noted in detail today

## 2023-06-20 NOTE — Assessment & Plan Note (Signed)
Takes occational xanax , not often

## 2023-06-20 NOTE — Patient Instructions (Signed)
Jamie Dillon , Thank you for taking time to come for your Medicare Wellness Visit. I appreciate your ongoing commitment to your health goals. Please review the following plan we discussed and let me know if I can assist you in the future.   Referrals/Orders/Follow-Ups/Clinician Recommendations: none  This is a list of the screening recommended for you and due dates:  Health Maintenance  Topic Date Due   COVID-19 Vaccine (6 - 2024-25 season) 01/15/2023   Zoster (Shingles) Vaccine (2 of 2) 06/02/2023   Mammogram  08/24/2023   Medicare Annual Wellness Visit  06/19/2024   Colon Cancer Screening  04/21/2029   DTaP/Tdap/Td vaccine (4 - Td or Tdap) 08/12/2031   Pneumonia Vaccine  Completed   Flu Shot  Completed   DEXA scan (bone density measurement)  Completed   Hepatitis C Screening  Completed   HPV Vaccine  Aged Out    Advanced directives: (Copy Requested) Please bring a copy of your health care power of attorney and living will to the office to be added to your chart at your convenience.  Next Medicare Annual Wellness Visit scheduled for next year: Yes2/09/2024 @ 8:10am televisit

## 2023-06-20 NOTE — Patient Instructions (Addendum)
 Get your 2nd shingrix as planned   Keep walking Add some strength training to your routine, this is important for bone and brain health and can reduce your risk of falls and help your body use insulin properly and regulate weight  Light weights, exercise bands , and internet videos are a good way to start  Yoga (chair or regular), machines , floor exercises or a gym with machines are also good options    Try the atrovent  nasal spray for congestion  Get back to the allergist if needed   Take care of yourself

## 2023-06-20 NOTE — Progress Notes (Signed)
 Subjective:    Patient ID: Jamie Dillon, female    DOB: Aug 10, 1950, 73 y.o.   MRN: [id number]  HPI  Here for health maintenance exam and to review chronic medical problems   Wt Readings from Last 3 Encounters:  06/20/23 136 lb 4 oz (61.8 kg)  06/20/23 138 lb (62.6 kg)  03/16/23 138 lb (62.6 kg)   26.39 kg/m  Vitals:   06/20/23 0919  BP: 104/66  Pulse: 77  Temp: 98.3 F (36.8 C)  SpO2: 97%    Immunization History  Administered Date(s) Administered   Fluad Quad(high Dose 65+) 01/23/2019   Fluad Trivalent(High Dose 65+) 02/10/2023   Influenza Split 03/04/2011   Influenza Whole 02/13/2008, 02/16/2009, 03/01/2010   Influenza, High Dose Seasonal PF 03/12/2015, 01/26/2016, 02/10/2017, 01/26/2018, 02/10/2020, 01/26/2021, 02/13/2022   Influenza-Unspecified 02/14/2014   PFIZER Comirnaty(Gray Top)Covid-19 Tri-Sucrose Vaccine 08/17/2020   PFIZER(Purple Top)SARS-COV-2 Vaccination 06/07/2019, 06/28/2019, 02/29/2020   Pfizer Covid-19 Vaccine Bivalent Booster 53yrs & up 02/10/2021   Pneumococcal Conjugate-13 04/28/2015   Pneumococcal Polysaccharide-23 02/13/2008, 04/25/2014   Td 10/04/2001   Tdap 03/04/2011, 08/11/2021   Zoster Recombinant(Shingrix) 04/07/2023    Health Maintenance Due  Topic Date Due   Zoster Vaccines- Shingrix (2 of 2) 06/02/2023   Amw today   Shingrix -first dose was 03/2023  2nd dose planned tomorrow   Mammogram 08/2022  Personal history of breast cancer  Finished 5 y of tamoxfen  Self breast exam- nothing new / no lumps   Gyn health-no problems    Colon cancer screening  colonoscopy 04/2019 with 10 y recall   Bone health  Dexa 01/2023  normal bmd  Falls- none  Fractures-none  Supplements -ca and D  Last vitamin D  Lab Results  Component Value Date   VD25OH 54.15 06/04/2020    Exercise :  Not a lot recently due to sinus issues  Usually walks 30 minutes per day   Allergies- sinus trouble  May have to get back to allergy shots  Uses  budesonide  over the counter -from [facility] nosebleed     Mood    06/20/2023    8:21 AM 01/04/2023   10:44 AM 06/16/2022    9:18 AM 03/30/2022   12:19 PM 06/15/2021    8:52 AM  Depression screen PHQ 2/9  Decreased Interest 0 1 0 0 0  Down, Depressed, Hopeless 1 1 0 0 0  PHQ - 2 Score 1 2 0 0 0  Altered sleeping  2     Tired, decreased energy  1     Change in appetite  1     Feeling bad or failure about yourself   1     Trouble concentrating  0     Moving slowly or fidgety/restless  0     Suicidal thoughts  0     PHQ-9 Score  7     Difficult doing work/chores  Not difficult at all         01/04/2023   10:44 AM  GAD 7 : Generalized Anxiety Score  Nervous, Anxious, on Edge 1  Control/stop worrying 1  Worry too much - different things 1  Trouble relaxing 1  Restless 0  Easily annoyed or irritable 1  Afraid - awful might happen 1  Total GAD 7 Score 6  Anxiety Difficulty Not difficult at all     Xanax  prn anxiety  Not often Overall doing well   HTN  bp is stable today  No cp or palpitations  or headaches or edema  No side effects to medicines  BP Readings from Last 3 Encounters:  06/20/23 104/66  03/16/23 (!) 142/86  01/04/23 132/82    Lisinopril  hct 20-25 mg daily  Metoprolol  xl 50 mg daily   Aortic atherosclerosis  On statin and blood pressure control Asa 81 mg daily   Lab Results  Component Value Date   NA 141 06/13/2023   K 3.8 06/13/2023   CO2 31 06/13/2023   GLUCOSE 94 06/13/2023   BUN 14 06/13/2023   CREATININE 0.92 06/13/2023   CALCIUM  9.9 06/13/2023   GFR 61.45 06/13/2023   EGFR 63 (L) 02/15/2017   GFRNONAA >60 02/24/2020   Lab Results  Component Value Date   ALT 17 06/13/2023   AST 19 06/13/2023   ALKPHOS 56 06/13/2023   BILITOT 0.5 06/13/2023     Hyperlipidemia Lab Results  Component Value Date   CHOL 180 06/13/2023   CHOL 169 06/09/2022   CHOL 181 06/15/2021   Lab Results  Component Value Date   HDL 46.80 06/13/2023    HDL 45.30 06/09/2022   HDL 45.00 06/15/2021   Lab Results  Component Value Date   LDLCALC 84 06/13/2023   LDLCALC 93 06/09/2022   LDLCALC 97 06/15/2021   Lab Results  Component Value Date   TRIG 250.0 (H) 06/13/2023   TRIG 150.0 (H) 06/09/2022   TRIG 193.0 (H) 06/15/2021   Lab Results  Component Value Date   CHOLHDL 4 06/13/2023   CHOLHDL 4 06/09/2022   CHOLHDL 4 06/15/2021   Lab Results  Component Value Date   LDLDIRECT 127.3 03/01/2011   Atorvastatin  10 mg daily  Trig up a bit   Lab Results  Component Value Date   WBC 8.8 06/13/2023   HGB 13.4 06/13/2023   HCT 39.2 06/13/2023   MCV 95.1 06/13/2023   PLT 265.0 06/13/2023   Lab Results  Component Value Date   TSH 2.24 06/13/2023   Sees derm yearly      Patient Active Problem List   Diagnosis Date Noted   Aortic atherosclerosis (HCC) 06/22/2017   Anxiety 07/08/2014   History of breast cancer 06/19/2014   Encounter for Medicare annual wellness exam 04/25/2014   Estrogen deficiency 04/25/2014   Routine general medical examination at a health care facility 02/26/2011   HYPERCHOLESTEROLEMIA, PURE 02/01/2007   Essential hypertension 09/08/2006   Allergic rhinitis 09/08/2006   TOBACCO ABUSE, HX OF 09/08/2006   Past Medical History:  Diagnosis Date   Allergic rhinitis    Allergy    Anxiety    Arthritis    back, right hand, ? knees    Breast cancer of lower-outer quadrant of right female breast (HCC) 06/19/2014   Cataract 03/2018   bilateral eyes   Disorder of bone and cartilage, unspecified    Family history of malignant neoplasm of breast    Family history of other kidney diseases    GERD (gastroesophageal reflux disease)    History of chemotherapy 2016   History of radiation therapy 2016   HLD (hyperlipidemia)    HTN (hypertension)    PONV (postoperative nausea and vomiting)    Tobacco abuse    Past Surgical History:  Procedure Laterality Date   ABDOMINAL HYSTERECTOMY     APPENDECTOMY      BREAST LUMPECTOMY WITH NEEDLE LOCALIZATION AND AXILLARY SENTINEL LYMPH NODE BX Right 07/21/2014   Procedure: BREAST LUMPECTOMY WITH NEEDLE LOCALIZATION AND AXILLARY SENTINEL LYMPH NODE BIOPSY;  Surgeon: Donnice Bury, MD;  Location: [facility] OR;  Service: General;  Laterality: Right;   COLONOSCOPY     DILATION AND CURETTAGE OF UTERUS     PORT-A-CATH REMOVAL N/A 03/12/2015   Procedure: REMOVAL PORT-A-CATH;  Surgeon: Donnice Bury, MD;  Location: [facility];  Service: General;  Laterality: N/A;   PORTACATH PLACEMENT Right 09/10/2014   Procedure: INSERTION PORT-A-CATH;  Surgeon: Donnice Bury, MD;  Location: [facility];  Service: General;  Laterality: Right;   TOTAL VAGINAL HYSTERECTOMY  1996   endometriosis   Social History   Tobacco Use   Smoking status: Former    Current packs/day: 0.00    Types: Cigarettes    Quit date: 06/25/2010    Years since quitting: 12.9   Smokeless tobacco: Never  Vaping Use   Vaping status: Never Used  Substance Use Topics   Alcohol use: Yes    Comment: rarely wine/ 2x per year   Drug use: No   Family History  Problem Relation Age of Onset   Hypertension Mother    Kidney disease Father    Breast cancer Sister 90   Diabetes Paternal Aunt    Colon cancer Neg Hx    Colon polyps Neg Hx    Esophageal cancer Neg Hx    Stomach cancer Neg Hx    Rectal cancer Neg Hx    Allergies  Allergen Reactions   Sulfa  Antibiotics Swelling    Tongue and lips swell up.    Dust Mite Extract    Pollen Extract    Current Outpatient Medications on File Prior to Visit  Medication Sig Dispense Refill   ALPRAZolam  (XANAX ) 0.5 MG tablet TAKE 1 TABLET BY MOUTH EVERY DAY AS NEEDED 30 tablet 0   aspirin 81 MG chewable tablet Chew 81 mg by mouth daily.     atorvastatin  (LIPITOR) 10 MG tablet TAKE 1 TABLET BY MOUTH EVERY DAY 90 tablet 0   Calcium  Carbonate-Vitamin D  (CALCIUM -VITAMIN D ) 500-200 MG-UNIT per tablet Take 3 tablets by mouth every  morning.     diphenhydrAMINE (BENADRYL) 25 MG tablet Take 25 mg by mouth daily as needed for allergies.     Docusate Calcium  (STOOL SOFTENER PO) Take 2 capsules by mouth daily.     Glucosamine-Chondroit-Vit C-Mn (GLUCOSAMINE 1500 COMPLEX) CAPS Take by mouth.     lisinopril -hydrochlorothiazide  (ZESTORETIC ) 20-25 MG tablet TAKE 1 TABLET BY MOUTH EVERY DAY 90 tablet 0   metoprolol  succinate (TOPROL -XL) 50 MG 24 hr tablet TAKE 1 TABLET EVERY DAY WITH OR IMMEDIATELY FOLLOWING A MEAL 90 tablet 2   Multiple Vitamin (MULTIVITAMIN) tablet Take 1 tablet by mouth daily.     No current facility-administered medications on file prior to visit.      Review of Systems  Constitutional:  Negative for activity change, appetite change, fatigue, fever and unexpected weight change.  HENT:  Negative for congestion, ear pain, rhinorrhea, sinus pressure and sore throat.   Eyes:  Negative for pain, redness and visual disturbance.  Respiratory:  Negative for cough, shortness of breath and wheezing.   Cardiovascular:  Negative for chest pain and palpitations.  Gastrointestinal:  Negative for abdominal pain, blood in stool, constipation and diarrhea.  Endocrine: Negative for polydipsia and polyuria.  Genitourinary:  Negative for dysuria, frequency and urgency.  Musculoskeletal:  Negative for arthralgias, back pain and myalgias.       Joints are stiff after inactivity  Skin:  Negative for pallor and rash.  Allergic/Immunologic: Negative for environmental allergies.  Neurological:  Negative for  dizziness, syncope and headaches.  Hematological:  Negative for adenopathy. Does not bruise/bleed easily.  Psychiatric/Behavioral:  Negative for decreased concentration and dysphoric mood. The patient is not nervous/anxious.        Objective:   Physical Exam Constitutional:      General: She is not in acute distress.    Appearance: Normal appearance. She is well-developed and normal weight. She is not ill-appearing or  diaphoretic.  HENT:     Head: Normocephalic and atraumatic.     Right Ear: Tympanic membrane, ear canal and external ear normal.     Left Ear: Tympanic membrane, ear canal and external ear normal.     Ears:     Comments: Partial cerumen impaction on left     Nose: Nose normal. No congestion.     Mouth/Throat:     Mouth: Mucous membranes are moist.     Pharynx: Oropharynx is clear. No posterior oropharyngeal erythema.  Eyes:     General: No scleral icterus.    Extraocular Movements: Extraocular movements intact.     Conjunctiva/sclera: Conjunctivae normal.     Pupils: Pupils are equal, round, and reactive to light.  Neck:     Thyroid : No thyromegaly.     Vascular: No carotid bruit or JVD.  Cardiovascular:     Rate and Rhythm: Normal rate and regular rhythm.     Pulses: Normal pulses.     Heart sounds: Normal heart sounds.     No gallop.  Pulmonary:     Effort: Pulmonary effort is normal. No respiratory distress.     Breath sounds: Normal breath sounds. No wheezing.     Comments: Good air exch Chest:     Chest wall: No tenderness.  Abdominal:     General: Bowel sounds are normal. There is no distension or abdominal bruit.     Palpations: Abdomen is soft. There is no mass.     Tenderness: There is no abdominal tenderness.     Hernia: No hernia is present.  Genitourinary:    Comments: Breast exam: No mass, nodules, thickening, tenderness, bulging, retraction, inflamation, nipple discharge or skin changes noted.  No axillary or clavicular LA.     Baseline surgical changes left breast  Musculoskeletal:        General: No tenderness. Normal range of motion.     Cervical back: Normal range of motion and neck supple. No rigidity. No muscular tenderness.     Right lower leg: No edema.     Left lower leg: No edema.     Comments: No kyphosis   Lymphadenopathy:     Cervical: No cervical adenopathy.  Skin:    General: Skin is warm and dry.     Coloration: Skin is not pale.      Findings: No erythema or rash.     Comments: Solar lentigines diffusely   Neurological:     Mental Status: She is alert. Mental status is at baseline.     Cranial Nerves: No cranial nerve deficit.     Motor: No abnormal muscle tone.     Coordination: Coordination normal.     Gait: Gait normal.     Deep Tendon Reflexes: Reflexes are normal and symmetric. Reflexes normal.  Psychiatric:        Mood and Affect: Mood normal.        Cognition and Memory: Cognition and memory normal.           Assessment & Plan:   Problem List Items  Addressed This Visit       Cardiovascular and Mediastinum   Essential hypertension   bp in fair control at this time  BP Readings from Last 1 Encounters:  06/20/23 104/66   No changes needed Most recent labs reviewed  Disc lifstyle change with low sodium diet and exercise  Plan to continue Lisinopril  hct 20-25 mg daily  Metoprolol  xl 50 mg daily      Aortic atherosclerosis (HCC)   No symptoms Continue to work on HTN and cholesterol control which are stable         Respiratory   Allergic rhinitis   May need to get back to allergist (took allergy immunotx in past before her breast cancer) Using budesonide  ns  Sent atrovent  ns to pharm to try  Call back and Er precautions noted in detail today           Other   Routine general medical examination at a health care facility - Primary   Reviewed health habits including diet and exercise and skin cancer prevention Reviewed appropriate screening tests for age  Also reviewed health mt list, fam hx and immunization status , as well as social and family history   See HPI Labs reviewed and ordered Health Maintenance  Topic Date Due   Zoster (Shingles) Vaccine (2 of 2) 06/02/2023   COVID-19 Vaccine (6 - 2024-25 season) 07/05/2024*   Mammogram  08/24/2023   Medicare Annual Wellness Visit  06/19/2024   Colon Cancer Screening  04/21/2029   DTaP/Tdap/Td vaccine (4 - Td or Tdap) 08/12/2031    Pneumonia Vaccine  Completed   Flu Shot  Completed   DEXA scan (bone density measurement)  Completed   Hepatitis C Screening  Completed   HPV Vaccine  Aged Out  *Topic was postponed. The date shown is not the original due date.   Amw was today  Getting 2nd shingrix vaccine tomorrow  Normal bmd dexa 01/2023  Discussed fall prevention, supplements and exercise for bone density  PHQ 2 Utd derm screening and care/uses sun protection       HYPERCHOLESTEROLEMIA, PURE   Disc goals for lipids and reasons to control them Rev last labs with pt Rev low sat fat diet in detail   LDL is 84 /improved Trig up at 250 despite better diet  Will watch this  No glucose problems   Continue atorvastatin  10 mg daily       Anxiety   Takes occational xanax  , not often

## 2023-06-20 NOTE — Assessment & Plan Note (Signed)
No symptoms Continue to work on HTN and cholesterol control which are stable

## 2023-08-21 ENCOUNTER — Other Ambulatory Visit: Payer: Self-pay | Admitting: Family Medicine

## 2023-08-24 ENCOUNTER — Other Ambulatory Visit: Payer: Self-pay | Admitting: Family Medicine

## 2023-08-24 NOTE — Telephone Encounter (Signed)
 Name of Medication: Xanax Name of Pharmacy: [facility] Last Fill or Written Date and Quantity: 05/09/23 #30 tabs/ 0 refills Last Office Visit and Type: CPE on 06/20/23 Next Office Visit and Type: none scheduled  Metoprolol also due filled last on 09/05/22 #90 tabs/ 2 refills

## 2023-08-25 ENCOUNTER — Other Ambulatory Visit: Payer: Self-pay | Admitting: Family Medicine

## 2023-08-25 LAB — HM MAMMOGRAPHY

## 2023-11-20 ENCOUNTER — Other Ambulatory Visit: Payer: Self-pay | Admitting: Family Medicine

## 2023-12-08 ENCOUNTER — Telehealth: Payer: Self-pay | Admitting: Family Medicine

## 2023-12-08 NOTE — Telephone Encounter (Signed)
 Please schedule labs prior to CPE (fasting), PCP will place the lab orders closer to appt date

## 2023-12-08 NOTE — Telephone Encounter (Unsigned)
 Copied from CRM [id number]. Topic: Clinical - Request for Lab/Test Order >> Dec 08, 2023 10:07 AM Donee H wrote: Reason for CRM: Patient called to schedule annual well visit/physical which is set for  Jun 20, 2024. Patient states she usual has labs a week prior to appointment. Patient wants to know if she needs to schedule an appointment for labs now and if they will be ordered now. Please follow back up with patient at [phone]

## 2023-12-15 ENCOUNTER — Other Ambulatory Visit: Payer: Self-pay | Admitting: Family Medicine

## 2024-04-02 ENCOUNTER — Other Ambulatory Visit: Payer: Self-pay | Admitting: Family Medicine

## 2024-04-03 NOTE — Telephone Encounter (Signed)
 Name of Medication: Xanax  Name of Pharmacy: [facility] Last Fill or Written Date and Quantity: 11/20/23 #30 tab/ 0 refills  Last Office Visit and Type: CPE 06/20/23 Next Office Visit and Type: CPE 06/20/24

## 2024-04-20 ENCOUNTER — Other Ambulatory Visit: Payer: Self-pay | Admitting: Family Medicine

## 2024-05-16 ENCOUNTER — Other Ambulatory Visit: Payer: Self-pay | Admitting: Family Medicine

## 2024-06-12 ENCOUNTER — Telehealth: Payer: Self-pay | Admitting: Family Medicine

## 2024-06-12 DIAGNOSIS — E7800 Pure hypercholesterolemia, unspecified: Secondary | ICD-10-CM

## 2024-06-12 DIAGNOSIS — I10 Essential (primary) hypertension: Secondary | ICD-10-CM

## 2024-06-12 NOTE — Telephone Encounter (Signed)
-----   Message from Harlene Du sent at 05/28/2024  3:17 PM EST ----- Regarding: Lab Fri 06/14/24 Hello,  Patient is coming in for CPE labs. Can we get orders please.   Thanks

## 2024-06-13 ENCOUNTER — Other Ambulatory Visit

## 2024-06-14 ENCOUNTER — Other Ambulatory Visit (INDEPENDENT_AMBULATORY_CARE_PROVIDER_SITE_OTHER)

## 2024-06-14 ENCOUNTER — Ambulatory Visit

## 2024-06-14 VITALS — BP 112/70 | HR 73 | Ht 60.0 in | Wt 138.4 lb

## 2024-06-14 DIAGNOSIS — Z0000 Encounter for general adult medical examination without abnormal findings: Secondary | ICD-10-CM | POA: Diagnosis not present

## 2024-06-14 DIAGNOSIS — E7800 Pure hypercholesterolemia, unspecified: Secondary | ICD-10-CM | POA: Diagnosis not present

## 2024-06-14 DIAGNOSIS — I10 Essential (primary) hypertension: Secondary | ICD-10-CM | POA: Diagnosis not present

## 2024-06-14 LAB — COMPREHENSIVE METABOLIC PANEL WITH GFR
ALT: 25 U/L (ref 3–35)
AST: 25 U/L (ref 5–37)
Albumin: 4.5 g/dL (ref 3.5–5.2)
Alkaline Phosphatase: 52 U/L (ref 39–117)
BUN: 18 mg/dL (ref 6–23)
CO2: 29 meq/L (ref 19–32)
Calcium: 9.7 mg/dL (ref 8.4–10.5)
Chloride: 103 meq/L (ref 96–112)
Creatinine, Ser: 0.91 mg/dL (ref 0.40–1.20)
GFR: 61.83 mL/min
Glucose, Bld: 100 mg/dL — ABNORMAL HIGH (ref 70–99)
Potassium: 3.9 meq/L (ref 3.5–5.1)
Sodium: 142 meq/L (ref 135–145)
Total Bilirubin: 0.4 mg/dL (ref 0.2–1.2)
Total Protein: 7.4 g/dL (ref 6.0–8.3)

## 2024-06-14 LAB — CBC WITH DIFFERENTIAL/PLATELET
Basophils Absolute: 0.1 10*3/uL (ref 0.0–0.1)
Basophils Relative: 1.8 % (ref 0.0–3.0)
Eosinophils Absolute: 0.4 10*3/uL (ref 0.0–0.7)
Eosinophils Relative: 6.7 % — ABNORMAL HIGH (ref 0.0–5.0)
HCT: 38.1 % (ref 36.0–46.0)
Hemoglobin: 13.3 g/dL (ref 12.0–15.0)
Lymphocytes Relative: 24.3 % (ref 12.0–46.0)
Lymphs Abs: 1.6 10*3/uL (ref 0.7–4.0)
MCHC: 34.8 g/dL (ref 30.0–36.0)
MCV: 93.6 fl (ref 78.0–100.0)
Monocytes Absolute: 0.7 10*3/uL (ref 0.1–1.0)
Monocytes Relative: 10.4 % (ref 3.0–12.0)
Neutro Abs: 3.7 10*3/uL (ref 1.4–7.7)
Neutrophils Relative %: 56.8 % (ref 43.0–77.0)
Platelets: 267 10*3/uL (ref 150.0–400.0)
RBC: 4.07 Mil/uL (ref 3.87–5.11)
RDW: 13.2 % (ref 11.5–15.5)
WBC: 6.4 10*3/uL (ref 4.0–10.5)

## 2024-06-14 LAB — LIPID PANEL
Cholesterol: 176 mg/dL (ref 28–200)
HDL: 40.4 mg/dL
LDL Cholesterol: 88 mg/dL (ref 10–99)
NonHDL: 135.78
Total CHOL/HDL Ratio: 4
Triglycerides: 241 mg/dL — ABNORMAL HIGH (ref 10.0–149.0)
VLDL: 48.2 mg/dL — ABNORMAL HIGH (ref 0.0–40.0)

## 2024-06-14 LAB — TSH: TSH: 2.23 u[IU]/mL (ref 0.35–5.50)

## 2024-06-14 NOTE — Patient Instructions (Addendum)
 Jamie Dillon,  Thank you for taking the time for your Medicare Wellness Visit. I appreciate your continued commitment to your health goals. Please review the care plan we discussed, and feel free to reach out if I can assist you further.  Please note that Annual Wellness Visits do not include a physical exam. Some assessments may be limited, especially if the visit was conducted virtually. If needed, we may recommend an in-person follow-up with your provider.  Ongoing Care Seeing your primary care provider every 3 to 6 months helps us  monitor your health and provide consistent, personalized care.   Referrals If a referral was made during today's visit and you haven't received any updates within two weeks, please contact the referred provider directly to check on the status.  Recommended Screenings:  Health Maintenance  Topic Date Due   COVID-19 Vaccine (6 - 2025-26 season) 01/15/2024   Breast Cancer Screening  08/24/2024   Medicare Annual Wellness Visit  06/14/2025   Colon Cancer Screening  04/21/2029   DTaP/Tdap/Td vaccine (4 - Td or Tdap) 08/12/2031   Pneumococcal Vaccine for age over 66  Completed   Flu Shot  Completed   Osteoporosis screening with Bone Density Scan  Completed   Hepatitis C Screening  Completed   Zoster (Shingles) Vaccine  Completed   Meningitis B Vaccine  Aged Out       06/20/2023    8:26 AM  Advanced Directives  Does Patient Have a Medical Advance Directive? Yes  Type of Advance Directive Healthcare Power of Attorney  Copy of Healthcare Power of Attorney in Chart? No - copy requested    Vision: Annual vision screenings are recommended for early detection of glaucoma, cataracts, and diabetic retinopathy. These exams can also reveal signs of chronic conditions such as diabetes and high blood pressure.  Dental: Annual dental screenings help detect early signs of oral cancer, gum disease, and other conditions linked to overall health, including heart disease and  diabetes.

## 2024-06-16 ENCOUNTER — Ambulatory Visit: Payer: Self-pay | Admitting: Family Medicine

## 2024-06-20 ENCOUNTER — Ambulatory Visit: Admitting: Family Medicine

## 2024-06-20 ENCOUNTER — Ambulatory Visit: Payer: Medicare Other

## 2024-06-20 ENCOUNTER — Encounter: Payer: Self-pay | Admitting: Family Medicine

## 2024-06-20 ENCOUNTER — Ambulatory Visit

## 2024-06-20 VITALS — BP 126/70 | HR 68 | Temp 98.2°F | Ht 60.25 in | Wt 138.4 lb

## 2024-06-20 DIAGNOSIS — F419 Anxiety disorder, unspecified: Secondary | ICD-10-CM

## 2024-06-20 DIAGNOSIS — E7800 Pure hypercholesterolemia, unspecified: Secondary | ICD-10-CM

## 2024-06-20 DIAGNOSIS — I10 Essential (primary) hypertension: Secondary | ICD-10-CM

## 2024-06-20 DIAGNOSIS — Z0000 Encounter for general adult medical examination without abnormal findings: Secondary | ICD-10-CM

## 2024-06-20 MED ORDER — ATORVASTATIN CALCIUM 10 MG PO TABS: 10.0000 mg | ORAL_TABLET | Freq: Every day | ORAL | 3 refills | Status: AC

## 2024-06-20 MED ORDER — METOPROLOL SUCCINATE ER 50 MG PO TB24: ORAL_TABLET | ORAL | 3 refills | Status: AC

## 2024-06-20 MED ORDER — LISINOPRIL-HYDROCHLOROTHIAZIDE 20-25 MG PO TABS: 1.0000 | ORAL_TABLET | Freq: Every day | ORAL | 3 refills | Status: AC

## 2024-06-20 NOTE — Assessment & Plan Note (Signed)
 bp in fair control at this time  BP Readings from Last 1 Encounters:  06/20/24 126/70   No changes needed Most recent labs reviewed  Disc lifstyle change with low sodium diet and exercise  Plan to continue Lisinopril  hct 20-25 mg daily  Metoprolol  xl 50 mg daily

## 2024-06-20 NOTE — Assessment & Plan Note (Signed)
 Takes occational xanax , not often

## 2024-06-20 NOTE — Assessment & Plan Note (Signed)
 Disc goals for lipids and reasons to control them Rev last labs with pt Rev low sat fat diet in detail  Discussed reducing sugar intake for high triglycerides Exercise to increase HDL LDL-continues in 80s

## 2024-06-20 NOTE — Patient Instructions (Addendum)
 Add some strength training to your routine, this is important for bone and brain health and can reduce your risk of falls and help your body use insulin properly and regulate weight  Light weights, exercise bands , and internet videos are a good way to start  Yoga (chair or regular), machines , floor exercises or a gym with machines are also good options   Walk when you feel up to it   Exercise will help raise the HDL (good) cholesterol  Less sugar will help triglycerides   Be mindful of added sugar  Avoid added sugars in your diet when you can  Try to get most of your carbohydrates from produce (with the exception of white potatoes) and whole grains Eat less bread/pasta/rice/snack foods/cereals/sweets and other items from the middle of the grocery store (processed carbs)   Take care of yourself

## 2024-06-20 NOTE — Progress Notes (Signed)
 "  Subjective:    Patient ID: Jamie Dillon, female    DOB: 1950/08/07, 74 y.o.   MRN: [id number]  HPI  Here for health maintenance exam and to review chronic medical problems   Wt Readings from Last 3 Encounters:  06/20/24 138 lb 6 oz (62.8 kg)  06/14/24 138 lb 6.4 oz (62.8 kg)  06/20/23 136 lb 4 oz (61.8 kg)   26.80 kg/m  Vitals:   06/20/24 1126  BP: 126/70  Pulse: 68  Temp: 98.2 F (36.8 C)  SpO2: 98%    Immunization History  Administered Date(s) Administered   Fluad Quad(high Dose 65+) 01/23/2019   Fluad Trivalent(High Dose 65+) 02/10/2023   INFLUENZA, HIGH DOSE SEASONAL PF 03/12/2015, 01/26/2016, 02/10/2017, 01/26/2018, 02/10/2020, 01/26/2021, 02/13/2022, 03/05/2024   Influenza Split 03/04/2011   Influenza Whole 02/13/2008, 02/16/2009, 03/01/2010   Influenza-Unspecified 02/14/2014   PFIZER Comirnaty(Gray Top)Covid-19 Tri-Sucrose Vaccine 08/17/2020   PFIZER(Purple Top)SARS-COV-2 Vaccination 06/07/2019, 06/28/2019, 02/29/2020   Pfizer Covid-19 Vaccine Bivalent Booster 29yrs & up 02/10/2021   Pneumococcal Conjugate-13 04/28/2015   Pneumococcal Polysaccharide-23 02/13/2008, 04/25/2014   Td 10/04/2001   Tdap 03/04/2011, 08/11/2021   Zoster Recombinant(Shingrix) 04/07/2023, 06/21/2023    There are no preventive care reminders to display for this patient.  Feeling ok  Taking care of herself best she can  Struggles with allergies Sees allergist    Mammogram 08/2023 Personal history of breast cancer  Self breast exam-no lumps   Gyn health Hysterectomy    Colon cancer screening  Colonoscopy 04/2019 No history of polyps ? Maybe cologuard in future   Bone health  Dexa 06/2020 normal bmd  Falls-none  Fractures-none  Supplements  ca plus D  Last vitamin D  Lab Results  Component Value Date   VD25OH 54.15 06/04/2020    Exercise  Not much right now  Does have a treadmill at home (hard to exercise when allergy symptoms are bad)     Mood    06/14/2024     9:47 AM 06/20/2023    8:21 AM 01/04/2023   10:44 AM 06/16/2022    9:18 AM 03/30/2022   12:19 PM  Depression screen PHQ 2/9  Decreased Interest 0 0 1 0 0  Down, Depressed, Hopeless 0 1 1 0 0  PHQ - 2 Score 0 1 2 0 0  Altered sleeping 1  2    Tired, decreased energy 0  1    Change in appetite 0  1    Feeling bad or failure about yourself  0  1    Trouble concentrating 0  0    Moving slowly or fidgety/restless 0  0    Suicidal thoughts 0  0    PHQ-9 Score 1  7     Difficult doing work/chores Not difficult at all  Not difficult at all       Data saved with a previous flowsheet row definition   HTN  bp is stable today  No cp or palpitations or headaches or edema  No side effects to medicines  BP Readings from Last 3 Encounters:  06/20/24 126/70  06/14/24 112/70  06/20/23 104/66     Lab Results  Component Value Date   NA 142 06/14/2024   K 3.9 06/14/2024   CO2 29 06/14/2024   GLUCOSE 100 (H) 06/14/2024   BUN 18 06/14/2024   CREATININE 0.91 06/14/2024   CALCIUM  9.7 06/14/2024   GFR 61.83 06/14/2024   EGFR 63 (L) 02/15/2017   GFRNONAA >60 02/24/2020  Lisinopril  hct 20-25 mg daily  Metoprolol  xl 50 mg daily  Hyperlipidemia Lab Results  Component Value Date   CHOL 176 06/14/2024   CHOL 180 06/13/2023   CHOL 169 06/09/2022   Lab Results  Component Value Date   HDL 40.40 06/14/2024   HDL 46.80 06/13/2023   HDL 45.30 06/09/2022   Lab Results  Component Value Date   LDLCALC 88 06/14/2024   LDLCALC 84 06/13/2023   LDLCALC 93 06/09/2022   Lab Results  Component Value Date   TRIG 241.0 (H) 06/14/2024   TRIG 250.0 (H) 06/13/2023   TRIG 150.0 (H) 06/09/2022   Lab Results  Component Value Date   CHOLHDL 4 06/14/2024   CHOLHDL 4 06/13/2023   CHOLHDL 4 06/09/2022   Lab Results  Component Value Date   LDLDIRECT 127.3 03/01/2011   Eating out less  Eating a bit better (husband is on heart healthy diet)  The 10-year ASCVD risk score (Arnett DK, et al.,  2019) is: 20.1%   Values used to calculate the score:     Age: 71 years     Clinically relevant sex: Female     Is Non-Hispanic African American: No     Diabetic: No     Tobacco smoker: No     Systolic Blood Pressure: 126 mmHg     Is BP treated: Yes     HDL Cholesterol: 40.4 mg/dL     Total Cholesterol: 176 mg/dL  Continues atorvastatin  10 mg daily     Lab Results  Component Value Date   ALT 25 06/14/2024   AST 25 06/14/2024   ALKPHOS 52 06/14/2024   BILITOT 0.4 06/14/2024   Lab Results  Component Value Date   WBC 6.4 06/14/2024   HGB 13.3 06/14/2024   HCT 38.1 06/14/2024   MCV 93.6 06/14/2024   PLT 267.0 06/14/2024   Lab Results  Component Value Date   TSH 2.23 06/14/2024     Patient Active Problem List   Diagnosis Date Noted   Aortic atherosclerosis 06/22/2017   Anxiety 07/08/2014   History of breast cancer 06/19/2014   Encounter for Medicare annual wellness exam 04/25/2014   Estrogen deficiency 04/25/2014   Routine general medical examination at a health care facility 02/26/2011   HYPERCHOLESTEROLEMIA, PURE 02/01/2007   Essential hypertension 09/08/2006   Allergic rhinitis 09/08/2006   TOBACCO ABUSE, HX OF 09/08/2006   Past Medical History:  Diagnosis Date   Allergic rhinitis    Allergy    Anxiety    Arthritis    back, right hand, ? knees    Breast cancer [facility]) March 2016   Breast cancer of lower-outer quadrant of right female breast (HCC) 06/19/2014   Cataract 03/2018   bilateral eyes   Disorder of bone and cartilage, unspecified    Family history of malignant neoplasm of breast    Family history of other kidney diseases    GERD (gastroesophageal reflux disease)    History of chemotherapy 2016   History of radiation therapy 2016   HLD (hyperlipidemia)    HTN (hypertension)    PONV (postoperative nausea and vomiting)    Tobacco abuse    Past Surgical History:  Procedure Laterality Date   ABDOMINAL HYSTERECTOMY     APPENDECTOMY     BREAST  LUMPECTOMY WITH NEEDLE LOCALIZATION AND AXILLARY SENTINEL LYMPH NODE BX Right 07/21/2014   Procedure: BREAST LUMPECTOMY WITH NEEDLE LOCALIZATION AND AXILLARY SENTINEL LYMPH NODE BIOPSY;  Surgeon: Donnice Bury, MD;  Location: [facility] OR;  Service: General;  Laterality: Right;   COLONOSCOPY     DILATION AND CURETTAGE OF UTERUS     PORT-A-CATH REMOVAL N/A 03/12/2015   Procedure: REMOVAL PORT-A-CATH;  Surgeon: Donnice Bury, MD;  Location: [facility];  Service: General;  Laterality: N/A;   PORTACATH PLACEMENT Right 09/10/2014   Procedure: INSERTION PORT-A-CATH;  Surgeon: Donnice Bury, MD;  Location:  SURGERY CENTER;  Service: General;  Laterality: Right;   TOTAL VAGINAL HYSTERECTOMY  1996   endometriosis   Social History[1] Family History  Problem Relation Age of Onset   Hypertension Mother    Kidney disease Father    Breast cancer Sister 106   Diabetes Paternal Aunt    Colon cancer Neg Hx    Colon polyps Neg Hx    Esophageal cancer Neg Hx    Stomach cancer Neg Hx    Rectal cancer Neg Hx    Allergies[2] Medications Ordered Prior to Encounter[3]  Review of Systems  Constitutional:  Negative for activity change, appetite change, fatigue, fever and unexpected weight change.  HENT:  Negative for congestion, ear pain, rhinorrhea, sinus pressure and sore throat.   Eyes:  Negative for pain, redness and visual disturbance.  Respiratory:  Negative for cough, shortness of breath and wheezing.   Cardiovascular:  Negative for chest pain and palpitations.  Gastrointestinal:  Negative for abdominal pain, blood in stool, constipation and diarrhea.  Endocrine: Negative for polydipsia and polyuria.  Genitourinary:  Negative for dysuria, frequency and urgency.  Musculoskeletal:  Negative for arthralgias, back pain and myalgias.  Skin:  Negative for pallor and rash.  Allergic/Immunologic: Negative for environmental allergies.  Neurological:  Negative for dizziness,  syncope and headaches.  Hematological:  Negative for adenopathy. Does not bruise/bleed easily.  Psychiatric/Behavioral:  Negative for decreased concentration and dysphoric mood. The patient is not nervous/anxious.        Objective:   Physical Exam Constitutional:      General: She is not in acute distress.    Appearance: Normal appearance. She is well-developed and normal weight. She is not ill-appearing or diaphoretic.  HENT:     Head: Normocephalic and atraumatic.     Right Ear: Tympanic membrane, ear canal and external ear normal.     Left Ear: Tympanic membrane, ear canal and external ear normal.     Nose: Nose normal. No congestion.     Mouth/Throat:     Mouth: Mucous membranes are moist.     Pharynx: Oropharynx is clear. No posterior oropharyngeal erythema.  Eyes:     General: No scleral icterus.    Extraocular Movements: Extraocular movements intact.     Conjunctiva/sclera: Conjunctivae normal.     Pupils: Pupils are equal, round, and reactive to light.  Neck:     Thyroid : No thyromegaly.     Vascular: No carotid bruit or JVD.  Cardiovascular:     Rate and Rhythm: Normal rate and regular rhythm.     Pulses: Normal pulses.     Heart sounds: Normal heart sounds.     No gallop.  Pulmonary:     Effort: Pulmonary effort is normal. No respiratory distress.     Breath sounds: Normal breath sounds. No wheezing.     Comments: Good air exch Chest:     Chest wall: No tenderness.  Abdominal:     General: Bowel sounds are normal. There is no distension or abdominal bruit.     Palpations: Abdomen is soft. There is no mass.  Tenderness: There is no abdominal tenderness.     Hernia: No hernia is present.  Genitourinary:    Comments: Breast exam: No mass, nodules, thickening, tenderness, bulging, retraction, inflamation, nipple discharge or skin changes noted.  No axillary or clavicular LA.     Baseline surgical changes noted Musculoskeletal:        General: No tenderness.  Normal range of motion.     Cervical back: Normal range of motion and neck supple. No rigidity. No muscular tenderness.     Right lower leg: No edema.     Left lower leg: No edema.     Comments: No kyphosis   Lymphadenopathy:     Cervical: No cervical adenopathy.  Skin:    General: Skin is warm and dry.     Coloration: Skin is not pale.     Findings: No erythema or rash.     Comments: Solar lentigines diffusely Some sks   Neurological:     Mental Status: She is alert. Mental status is at baseline.     Cranial Nerves: No cranial nerve deficit.     Motor: No abnormal muscle tone.     Coordination: Coordination normal.     Gait: Gait normal.     Deep Tendon Reflexes: Reflexes are normal and symmetric. Reflexes normal.  Psychiatric:        Mood and Affect: Mood normal.        Cognition and Memory: Cognition and memory normal.           Assessment & Plan:   Problem List Items Addressed This Visit       Cardiovascular and Mediastinum   Essential hypertension   bp in fair control at this time  BP Readings from Last 1 Encounters:  06/20/24 126/70   No changes needed Most recent labs reviewed  Disc lifstyle change with low sodium diet and exercise  Plan to continue Lisinopril  hct 20-25 mg daily  Metoprolol  xl 50 mg daily      Relevant Medications   atorvastatin  (LIPITOR) 10 MG tablet   lisinopril -hydrochlorothiazide  (ZESTORETIC ) 20-25 MG tablet   metoprolol  succinate (TOPROL -XL) 50 MG 24 hr tablet     Other   Routine general medical examination at a health care facility - Primary   Reviewed health habits including diet and exercise and skin cancer prevention Reviewed appropriate screening tests for age  Also reviewed health mt list, fam hx and immunization status , as well as social and family history   See HPI Labs reviewed and ordered Health Maintenance  Topic Date Due   COVID-19 Vaccine (6 - 2025-26 season) 07/06/2025*   Breast Cancer Screening  08/24/2024    Medicare Annual Wellness Visit  06/14/2025   Colon Cancer Screening  04/21/2029   DTaP/Tdap/Td vaccine (4 - Td or Tdap) 08/12/2031   Pneumococcal Vaccine for age over 65  Completed   Flu Shot  Completed   Osteoporosis screening with Bone Density Scan  Completed   Hepatitis C Screening  Completed   Zoster (Shingles) Vaccine  Completed   Meningitis B Vaccine  Aged Out  *Topic was postponed. The date shown is not the original due date.    Colonoscopy 2020 , pt is 73 May consider cologuard in future  Discussed fall prevention, supplements and exercise for bone density  PHQ 1       HYPERCHOLESTEROLEMIA, PURE   Disc goals for lipids and reasons to control them Rev last labs with pt Rev low sat fat diet in  detail  Discussed reducing sugar intake for high triglycerides Exercise to increase HDL LDL-continues in 80s        Relevant Medications   atorvastatin  (LIPITOR) 10 MG tablet   lisinopril -hydrochlorothiazide  (ZESTORETIC ) 20-25 MG tablet   metoprolol  succinate (TOPROL -XL) 50 MG 24 hr tablet   Anxiety   Takes occational xanax  , not often          [1]  Social History Tobacco Use   Smoking status: Former    Current packs/day: 0.00    Average packs/day: 1.0 packs/day    Types: Cigarettes    Quit date: 06/25/2010    Years since quitting: 13.9   Smokeless tobacco: Never  Vaping Use   Vaping status: Never Used  Substance Use Topics   Alcohol use: Yes    Alcohol/week: 1.0 standard drink of alcohol    Types: 1 Glasses of wine per week    Comment: rarely wine/ 2x per year   Drug use: No  [2]  Allergies Allergen Reactions   Sulfa  Antibiotics Swelling    Tongue and lips swell up.    Dust Mite Extract    Pollen Extract   [3]  Current Outpatient Medications on File Prior to Visit  Medication Sig Dispense Refill   ALPRAZolam  (XANAX ) 0.5 MG tablet TAKE 1 TABLET BY MOUTH EVERY DAY AS NEEDED 30 tablet 0   aspirin 81 MG chewable tablet Chew 81 mg by mouth daily.      Calcium  Carbonate-Vitamin D  (CALCIUM -VITAMIN D ) 500-200 MG-UNIT per tablet Take 3 tablets by mouth every morning.     diphenhydrAMINE (BENADRYL) 25 MG tablet Take 25 mg by mouth daily as needed for allergies.     Docusate Calcium  (STOOL SOFTENER PO) Take 2 capsules by mouth daily.     Glucosamine-Chondroit-Vit C-Mn (GLUCOSAMINE 1500 COMPLEX) CAPS Take by mouth.     ipratropium (ATROVENT ) 0.03 % nasal spray PLACE 2 SPRAYS INTO BOTH NOSTRILS EVERY 12 (TWELVE) HOURS. 90 mL 1   Multiple Vitamin (MULTIVITAMIN) tablet Take 1 tablet by mouth daily.     No current facility-administered medications on file prior to visit.   "

## 2024-06-20 NOTE — Assessment & Plan Note (Signed)
 Reviewed health habits including diet and exercise and skin cancer prevention Reviewed appropriate screening tests for age  Also reviewed health mt list, fam hx and immunization status , as well as social and family history   See HPI Labs reviewed and ordered Health Maintenance  Topic Date Due   COVID-19 Vaccine (6 - 2025-26 season) 07/06/2025*   Breast Cancer Screening  08/24/2024   Medicare Annual Wellness Visit  06/14/2025   Colon Cancer Screening  04/21/2029   DTaP/Tdap/Td vaccine (4 - Td or Tdap) 08/12/2031   Pneumococcal Vaccine for age over 66  Completed   Flu Shot  Completed   Osteoporosis screening with Bone Density Scan  Completed   Hepatitis C Screening  Completed   Zoster (Shingles) Vaccine  Completed   Meningitis B Vaccine  Aged Out  *Topic was postponed. The date shown is not the original due date.    Colonoscopy 2020 , pt is 73 May consider cologuard in future  Discussed fall prevention, supplements and exercise for bone density  PHQ 1

## 2025-06-23 ENCOUNTER — Encounter: Admitting: Family Medicine

## 2025-06-23 ENCOUNTER — Ambulatory Visit
